# Patient Record
Sex: Male | Born: 1980 | Race: Black or African American | Hispanic: No | Marital: Single | Smoking: Current every day smoker
Health system: Southern US, Community
[De-identification: ages and names within clinical notes are randomized; demographics above are authoritative.]

## PROBLEM LIST (undated history)

## (undated) DIAGNOSIS — M109 Gout, unspecified: Secondary | ICD-10-CM

## (undated) DIAGNOSIS — L732 Hidradenitis suppurativa: Secondary | ICD-10-CM

## (undated) DIAGNOSIS — J449 Chronic obstructive pulmonary disease, unspecified: Secondary | ICD-10-CM

## (undated) DIAGNOSIS — I509 Heart failure, unspecified: Secondary | ICD-10-CM

## (undated) DIAGNOSIS — I10 Essential (primary) hypertension: Secondary | ICD-10-CM

## (undated) DIAGNOSIS — F209 Schizophrenia, unspecified: Secondary | ICD-10-CM

## (undated) HISTORY — PX: OTHER SURGICAL HISTORY: SHX169

---

## 2004-10-07 ENCOUNTER — Emergency Department: Payer: Self-pay | Admitting: Internal Medicine

## 2006-07-15 ENCOUNTER — Emergency Department: Payer: Self-pay | Admitting: Emergency Medicine

## 2007-10-03 ENCOUNTER — Inpatient Hospital Stay: Payer: Self-pay | Admitting: Internal Medicine

## 2008-03-26 ENCOUNTER — Emergency Department: Payer: Self-pay | Admitting: Emergency Medicine

## 2008-04-28 ENCOUNTER — Emergency Department: Payer: Self-pay | Admitting: Emergency Medicine

## 2008-09-24 ENCOUNTER — Emergency Department: Payer: Self-pay | Admitting: Emergency Medicine

## 2008-09-25 ENCOUNTER — Emergency Department: Payer: Self-pay | Admitting: Emergency Medicine

## 2008-12-11 ENCOUNTER — Emergency Department: Payer: Self-pay | Admitting: Emergency Medicine

## 2008-12-21 ENCOUNTER — Emergency Department: Payer: Self-pay | Admitting: Internal Medicine

## 2009-03-08 ENCOUNTER — Emergency Department: Payer: Self-pay | Admitting: Emergency Medicine

## 2009-03-23 ENCOUNTER — Emergency Department: Payer: Self-pay | Admitting: Emergency Medicine

## 2009-07-02 ENCOUNTER — Emergency Department: Payer: Self-pay | Admitting: Emergency Medicine

## 2009-07-09 ENCOUNTER — Emergency Department: Payer: Self-pay | Admitting: Emergency Medicine

## 2009-08-22 ENCOUNTER — Emergency Department: Payer: Self-pay | Admitting: Unknown Physician Specialty

## 2009-11-04 ENCOUNTER — Emergency Department: Payer: Self-pay | Admitting: Emergency Medicine

## 2010-03-25 ENCOUNTER — Inpatient Hospital Stay: Payer: Self-pay | Admitting: Internal Medicine

## 2010-04-30 ENCOUNTER — Emergency Department: Payer: Self-pay | Admitting: Internal Medicine

## 2010-05-18 ENCOUNTER — Emergency Department: Payer: Self-pay | Admitting: Emergency Medicine

## 2010-05-19 ENCOUNTER — Inpatient Hospital Stay: Payer: Self-pay | Admitting: Internal Medicine

## 2010-06-25 ENCOUNTER — Emergency Department: Payer: Self-pay | Admitting: Emergency Medicine

## 2010-06-29 ENCOUNTER — Inpatient Hospital Stay: Payer: Self-pay | Admitting: Internal Medicine

## 2010-06-29 ENCOUNTER — Inpatient Hospital Stay: Payer: Self-pay | Admitting: Psychiatry

## 2010-07-02 ENCOUNTER — Inpatient Hospital Stay: Payer: Self-pay | Admitting: Psychiatry

## 2010-11-13 ENCOUNTER — Ambulatory Visit: Payer: Self-pay | Admitting: Surgery

## 2010-11-19 ENCOUNTER — Ambulatory Visit: Payer: Self-pay | Admitting: Surgery

## 2010-11-22 LAB — PATHOLOGY REPORT

## 2011-02-18 ENCOUNTER — Ambulatory Visit: Payer: Self-pay | Admitting: Family

## 2011-04-15 IMAGING — CR DG CHEST 2V
1 series · 2 of 2 positions shown · non-contrast
Comparison: none

REASON FOR EXAM: fever, leukocytosis
COMMENTS:

[Series 1: view not recorded · 0.17mm/px · 2 of 2 slices shown]
[im 1/2]
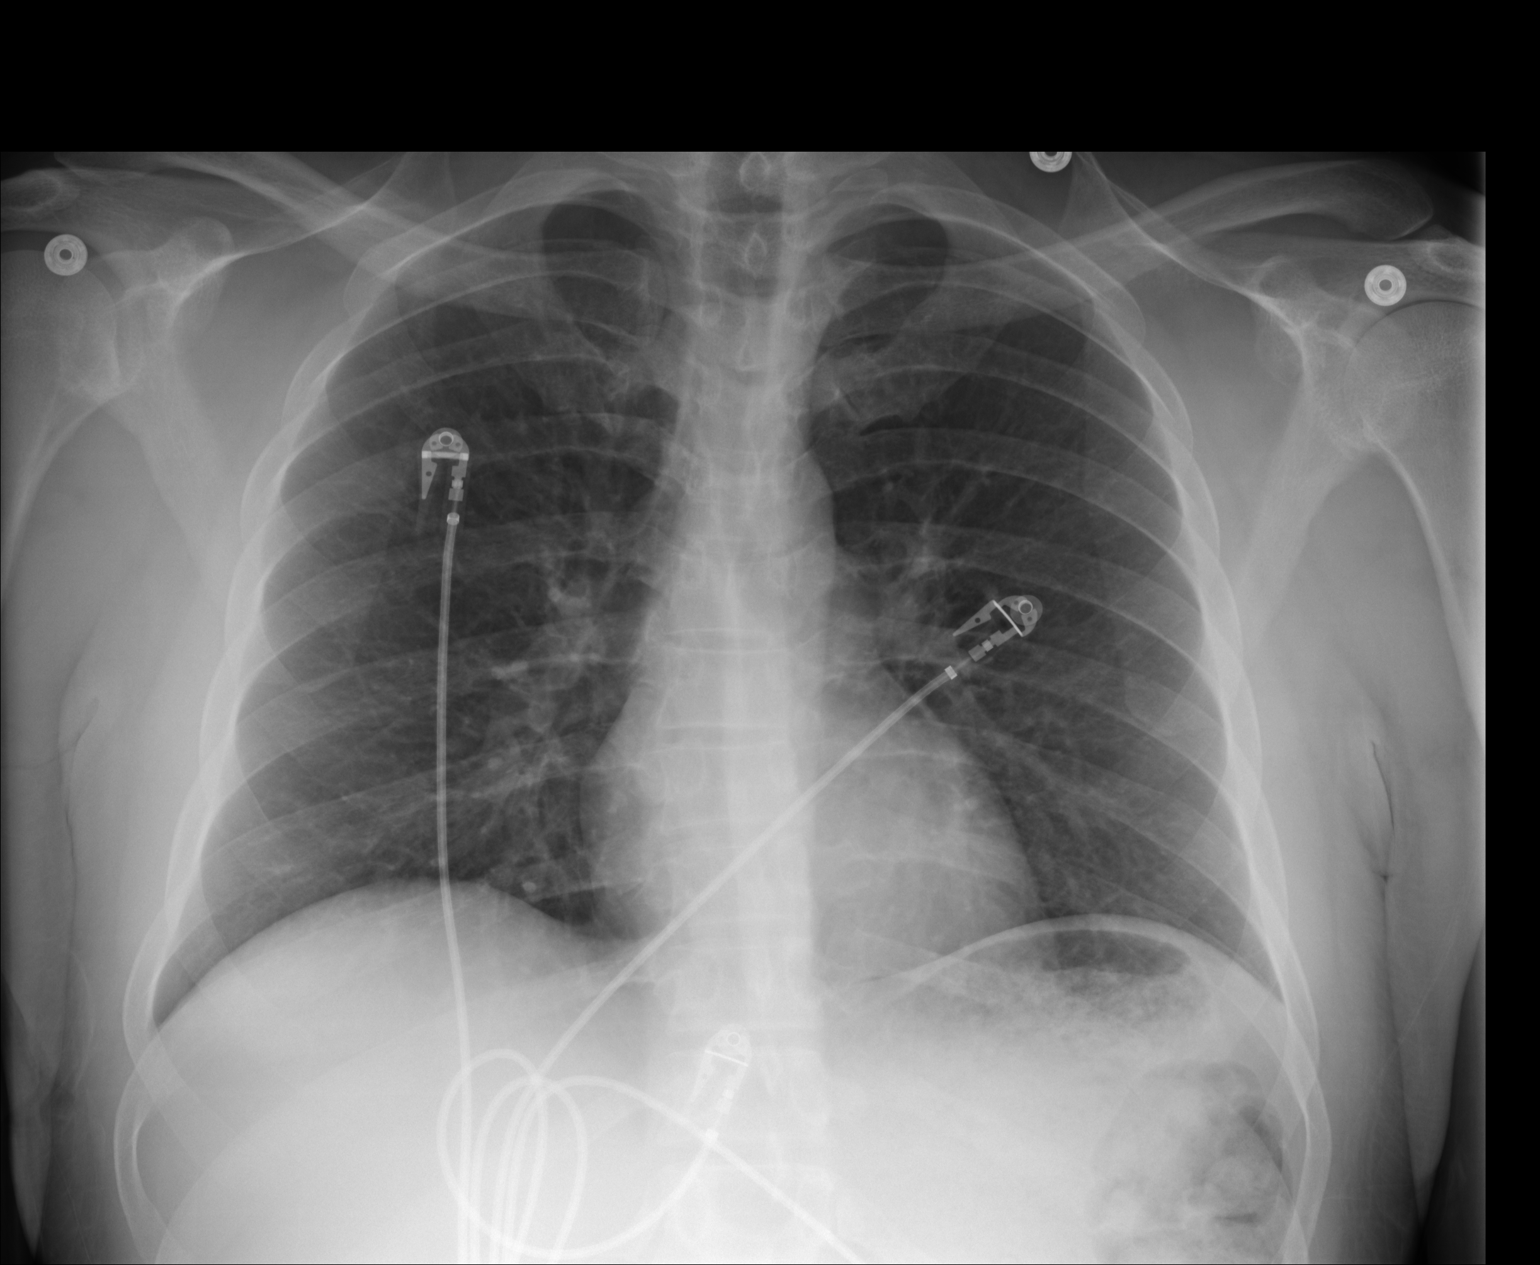
[im 2/2]
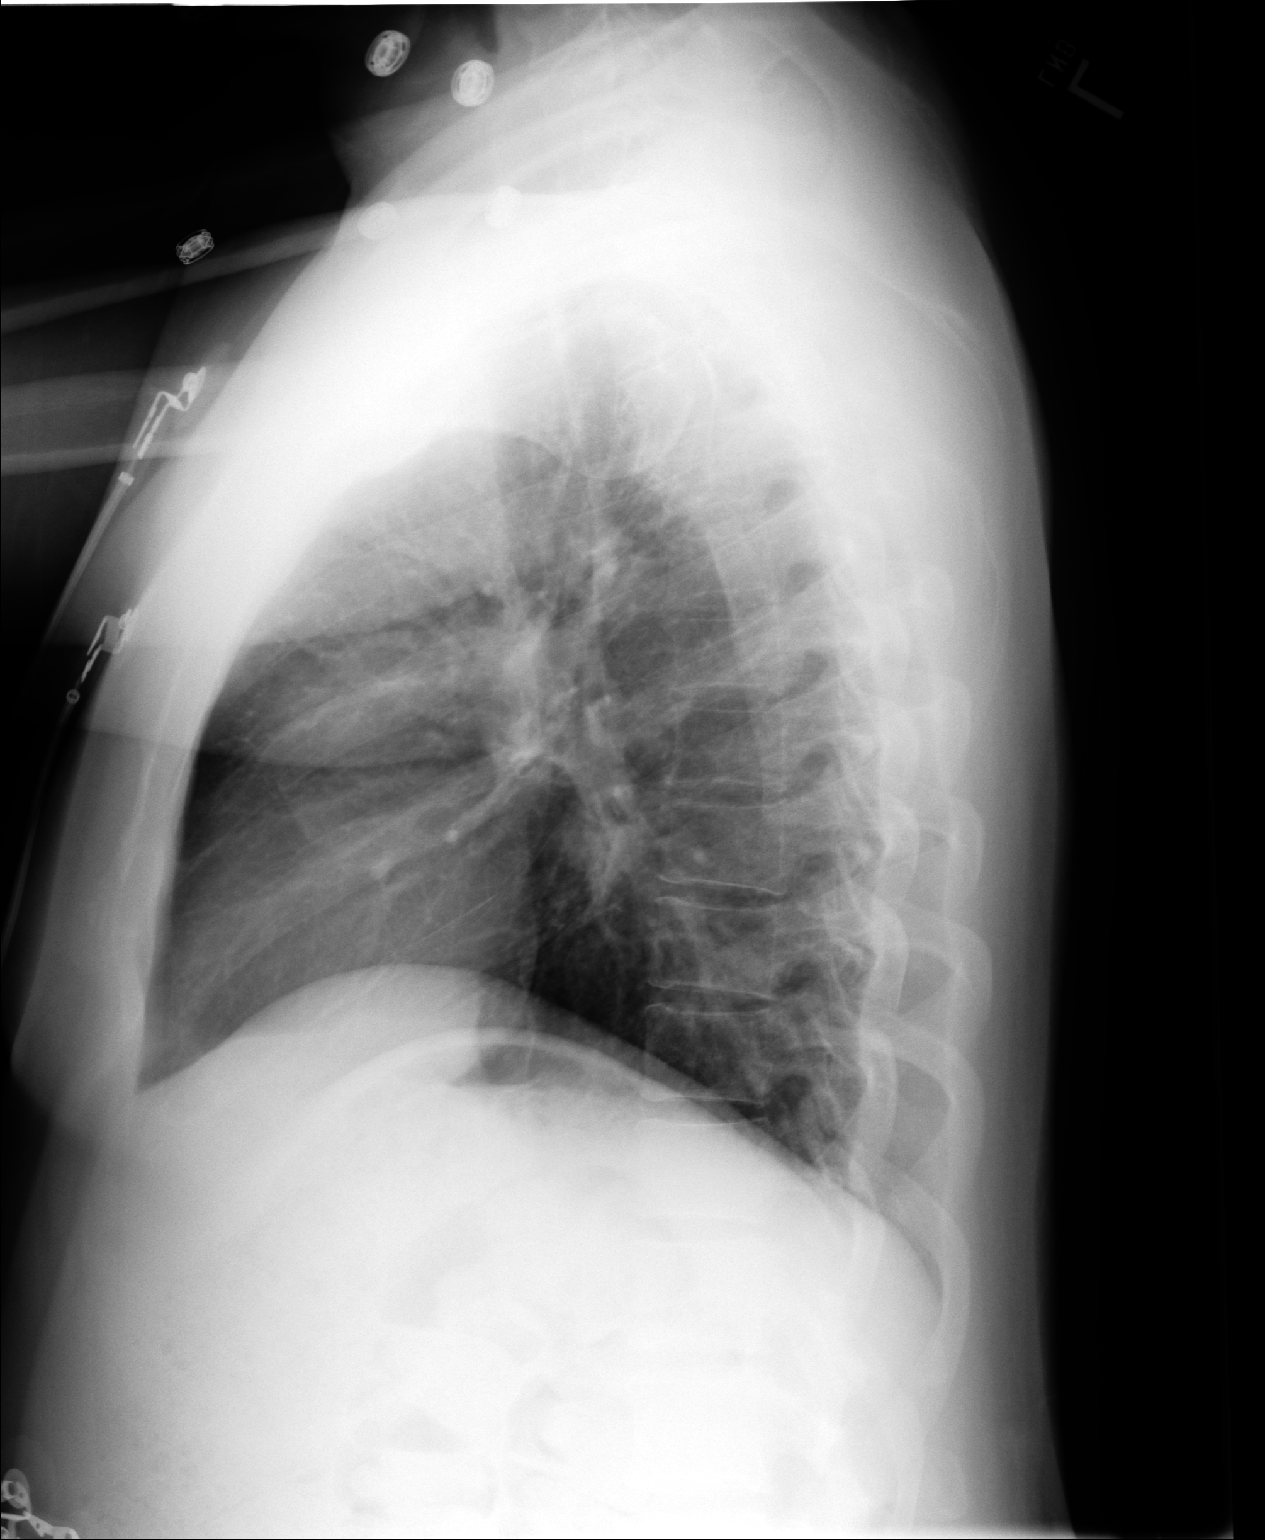

[2 of 2 positions shown; findings below may reference images not displayed]

PROCEDURE:     DXR - DXR CHEST PA (OR AP) AND LATERAL  - May 24, 2010  [DATE]

RESULT:     Comparison is made to previous examination dated 03/25/2010.

Cardiac monitoring electrodes are present. The lungs are clear. The heart
and pulmonary vessels are normal. The bony and mediastinal structures are
unremarkable. There is no effusion. There is no pneumothorax or evidence of
congestive failure.
IMPRESSION: No acute cardiopulmonary disease.

## 2012-06-22 ENCOUNTER — Emergency Department: Payer: Self-pay | Admitting: *Deleted

## 2012-06-22 LAB — URINALYSIS, COMPLETE
Bacteria: NONE SEEN
Bilirubin,UR: NEGATIVE
Blood: NEGATIVE
Glucose,UR: NEGATIVE mg/dL (ref 0–75)
Ketone: NEGATIVE
Nitrite: NEGATIVE
Ph: 7 (ref 4.5–8.0)
Protein: NEGATIVE
RBC,UR: 1 /HPF (ref 0–5)
Specific Gravity: 1.015 (ref 1.003–1.030)
Squamous Epithelial: <1
WBC UR: 13 /HPF (ref 0–5)

## 2012-12-16 ENCOUNTER — Emergency Department: Payer: Self-pay | Admitting: Emergency Medicine

## 2012-12-28 ENCOUNTER — Inpatient Hospital Stay: Payer: Self-pay | Admitting: Emergency Medicine

## 2012-12-29 LAB — CBC WITH DIFFERENTIAL/PLATELET
Basophil #: 0.1 10*3/uL (ref 0.0–0.1)
Eosinophil #: 0.1 10*3/uL (ref 0.0–0.7)
Eosinophil %: 1 %
HGB: 12.2 g/dL — ABNORMAL LOW (ref 13.0–18.0)
Lymphocyte #: 2.9 10*3/uL (ref 1.0–3.6)
MCH: 29.2 pg (ref 26.0–34.0)
MCHC: 32.6 g/dL (ref 32.0–36.0)
Neutrophil #: 7.5 10*3/uL — ABNORMAL HIGH (ref 1.4–6.5)
Neutrophil %: 65.9 %
Platelet: 304 10*3/uL (ref 150–440)
RDW: 15.3 % — ABNORMAL HIGH (ref 11.5–14.5)
WBC: 11.4 10*3/uL — ABNORMAL HIGH (ref 3.8–10.6)

## 2012-12-29 LAB — CREATININE, SERUM
Creatinine: 1.01 mg/dL (ref 0.60–1.30)
EGFR (African American): >60
EGFR (Non-African Amer.): >60

## 2012-12-29 LAB — PATHOLOGY REPORT

## 2012-12-30 LAB — CBC WITH DIFFERENTIAL/PLATELET
Basophil %: 0.4 %
Eosinophil %: 1.9 %
HCT: 36.3 % — ABNORMAL LOW (ref 40.0–52.0)
HGB: 12.2 g/dL — ABNORMAL LOW (ref 13.0–18.0)
Lymphocyte %: 21.5 %
MCH: 29.8 pg (ref 26.0–34.0)
MCV: 89 fL (ref 80–100)
Neutrophil %: 70.7 %
WBC: 12 10*3/uL — ABNORMAL HIGH (ref 3.8–10.6)

## 2013-03-06 ENCOUNTER — Emergency Department: Payer: Self-pay | Admitting: Emergency Medicine

## 2013-03-06 LAB — BASIC METABOLIC PANEL
BUN: 13 mg/dL (ref 7–18)
Calcium, Total: 8.7 mg/dL (ref 8.5–10.1)
Co2: 26 mmol/L (ref 21–32)
EGFR (African American): >60
Osmolality: 275 (ref 275–301)
Sodium: 138 mmol/L (ref 136–145)

## 2013-03-06 LAB — CBC
MCV: 86 fL (ref 80–100)
Platelet: 379 10*3/uL (ref 150–440)
RBC: 4.64 10*6/uL (ref 4.40–5.90)
RDW: 15.5 % — ABNORMAL HIGH (ref 11.5–14.5)
WBC: 13.4 10*3/uL — ABNORMAL HIGH (ref 3.8–10.6)

## 2013-08-08 ENCOUNTER — Emergency Department: Payer: Self-pay | Admitting: Emergency Medicine

## 2014-04-25 LAB — CBC
HCT: 48.5 % (ref 40.0–52.0)
HGB: 15.7 g/dL (ref 13.0–18.0)
MCH: 28.7 pg (ref 26.0–34.0)
MCHC: 32.3 g/dL (ref 32.0–36.0)
MCV: 89 fL (ref 80–100)
Platelet: 329 10*3/uL (ref 150–440)
RBC: 5.46 10*6/uL (ref 4.40–5.90)
RDW: 15.7 % — ABNORMAL HIGH (ref 11.5–14.5)
WBC: 15.3 10*3/uL — ABNORMAL HIGH (ref 3.8–10.6)

## 2014-04-25 LAB — URINALYSIS, COMPLETE
BILIRUBIN, UR: NEGATIVE
BLOOD: NEGATIVE
Bacteria: NONE SEEN
Glucose,UR: NEGATIVE mg/dL (ref 0–75)
Ketone: NEGATIVE
LEUKOCYTE ESTERASE: NEGATIVE
Nitrite: NEGATIVE
Ph: 6 (ref 4.5–8.0)
RBC,UR: 3 /HPF (ref 0–5)
SPECIFIC GRAVITY: 1.024 (ref 1.003–1.030)
Squamous Epithelial: 1
WBC UR: 6 /HPF (ref 0–5)

## 2014-04-25 LAB — COMPREHENSIVE METABOLIC PANEL
ALT: 40 U/L (ref 12–78)
ANION GAP: 5 — AB (ref 7–16)
Albumin: 3.7 g/dL (ref 3.4–5.0)
Alkaline Phosphatase: 87 U/L
BILIRUBIN TOTAL: 0.9 mg/dL (ref 0.2–1.0)
BUN: 8 mg/dL (ref 7–18)
Calcium, Total: 9.1 mg/dL (ref 8.5–10.1)
Chloride: 106 mmol/L (ref 98–107)
Co2: 26 mmol/L (ref 21–32)
Creatinine: 1.22 mg/dL (ref 0.60–1.30)
EGFR (Non-African Amer.): >60
GLUCOSE: 107 mg/dL — AB (ref 65–99)
Osmolality: 273 (ref 275–301)
Potassium: 3.3 mmol/L — ABNORMAL LOW (ref 3.5–5.1)
SGOT(AST): 39 U/L — ABNORMAL HIGH (ref 15–37)
SODIUM: 137 mmol/L (ref 136–145)
Total Protein: 10.1 g/dL — ABNORMAL HIGH (ref 6.4–8.2)

## 2014-04-25 LAB — DRUG SCREEN, URINE
Amphetamines, Ur Screen: NEGATIVE (ref ?–1000)
BARBITURATES, UR SCREEN: NEGATIVE (ref ?–200)
BENZODIAZEPINE, UR SCRN: NEGATIVE (ref ?–200)
COCAINE METABOLITE, UR ~~LOC~~: NEGATIVE (ref ?–300)
Cannabinoid 50 Ng, Ur ~~LOC~~: POSITIVE (ref ?–50)
MDMA (ECSTASY) UR SCREEN: NEGATIVE (ref ?–500)
Methadone, Ur Screen: NEGATIVE (ref ?–300)
Opiate, Ur Screen: NEGATIVE (ref ?–300)
Phencyclidine (PCP) Ur S: NEGATIVE (ref ?–25)
TRICYCLIC, UR SCREEN: NEGATIVE (ref ?–1000)

## 2014-04-25 LAB — SALICYLATE LEVEL: SALICYLATES, SERUM: 4 mg/dL — AB

## 2014-04-25 LAB — ETHANOL

## 2014-04-25 LAB — ACETAMINOPHEN LEVEL: Acetaminophen: <2 ug/mL

## 2014-04-26 ENCOUNTER — Inpatient Hospital Stay: Payer: Self-pay | Admitting: Psychiatry

## 2014-04-30 LAB — WBC: WBC: 11.9 10*3/uL — ABNORMAL HIGH (ref 3.8–10.6)

## 2014-11-05 ENCOUNTER — Emergency Department: Payer: Self-pay | Admitting: Emergency Medicine

## 2014-11-05 LAB — URIC ACID: URIC ACID: 5.8 mg/dL (ref 3.5–7.2)

## 2015-03-09 NOTE — Op Note (Signed)
PATIENT NAME:  Walter Pena, Walter Pena MR#:  [id number] DATE OF BIRTH:  11/24/1980  DATE OF PROCEDURE:  12/28/2012  This patient was seen by my office yesterday on an emergent basis. The patient had hydradenitis of the perineal area as well as a pilonidal cyst abscess and he was draining pus from all these areas and there is extensive disease involving the lateral and the medial aspect of the anal canal and had had multiple of pus coming out. The patient does not have any history of any Crohn's disease or history suggestive of that.  He had in the past extensive hydradenitis of both groins which he had surgery done before and then the wound closed on itself and then he also had hydradenitis of both axilla. This part on the back was really bad, 1 of the worst I have ever seen, because there was not a single area in this perineum which did not have any opening or draining pus.   My decision-making was such that the 2 to 3 fistulous opening which was going into the pilonidal cyst area first of all was draining a lot of pus so I did cultures from there and I put probes in multiple flap tracts.  I left it open and all the thickened area of the skin which he had chronic inflammation I also debrided skin and subcutaneous tissue with the Bovie and excised this hardened area, especially on the pilonidal cyst area.   The patient had another area on the right medial aspect of the rectum which also had multiple fistulous openings and these tracts were laid open and skin and subcutaneous tissue was then debrided with the Bovie and the tissue was about 3 cm thick.  The  thickness of the skin and subcutaneous tissue was then completely removed on this side and also multiple opening of the sinuses was done. Then there was another area where debridement was done, was in the left side where also there were multiple tracts and abscesses.  It was also debrided and then an area a little below at about 4 o'clock position also was then  debrided.   After extensive debridement was then performed and the wound was then packed with iodoform gauze dressing and a little bit 4 x 4 partially so with Betadine dressing to cover this up. No wounds were closed. The patient was then discharged back to the recovery room and will be admitted and will take ID consult. At this time, I am going to put him on Zosyn and it depends on what culture shows, then will change the antibiotics.   This is 1 of the worst cases I have ever done due to extensive disease for multiple draining sinuses and chronic hydradenitis of both axilla. It looked like stage IV hydradenitis with multiple fistulous openings. My plan was to get rid of infection first and then later on maybe do excision of these areas to maybe primary close but right now, hopefully, it can close itself.     ____________________________ Alton Revere. Cecelia Byars, MD msh:cs D: 12/28/2012 14:23:00 ET T: 12/28/2012 14:58:24 ET JOB#: [id number]  cc: Billiejean Schimek S. Cecelia Byars, MD, <Dictator> Corky Downs, MD Meryle Ready MD ELECTRONICALLY SIGNED 12/30/2012 18:38

## 2015-03-09 NOTE — Consult Note (Signed)
PATIENT NAME:  Walter Pena, Walter Pena MR#:  [id number] DATE OF BIRTH:  1981/05/03  DATE OF CONSULTATION:  12/29/2012  REFERRING PHYSICIAN:  Dr. Cecelia Byars.   CONSULTING PHYSICIAN:  Rosalyn Gess. Terriyah Westra, MD  REASON FOR CONSULTATION: Pilonidal cyst, status post I and D.   HISTORY OF PRESENT ILLNESS: The patient is a 34 year old man with a past history significant for schizophrenia and hidradenitis suppurativa, who was admitted yesterday after surgery on an infectious mass in the perirectal area. The patient states that he has had hidradenitis lesions in the groin and in the axillary area for several years. He has noted a lesion in the perianal area for approximately the last year. All of these lesions have been treated on and off with Keflex, which has typically caused them to either drain or shrink down. The perirectal lesion has not significantly changed. He was recently in jail for several months and the area went without any treatment. After getting out of jail, he was seen by his primary care doctor who referred him to surgery. Surgery yesterday drained a prior cyst abscess with multiple draining areas. Cultures have been obtained. He was started on Zosyn. The patient states he has not been having fevers, chills or sweats but has had some perianal discomfort.   ALLERGIES: ALOE AND LISINOPRIL.   PAST MEDICAL HISTORY: 1.  Schizophrenia.  2.  Hidradenitis suppurativa.  3.  Hypertension.  4.  Constipation.   SOCIAL HISTORY: In the past, he has lived in a group home. He also has recently been in jail.  He has a history of smoking regularly. He does not drink. No injecting drug use history.   FAMILY HISTORY: Positive for diabetes, hypertension and hypercholesterolemia.   REVIEW OF SYSTEMS:  GENERAL:  No fevers, chills or sweats. No nausea, vomiting or change in his bowels. He has had some perianal pain. No abdominal pain, no shortness of breath or cough. No urinary symptoms. No skin lesions other than the  hidradenitis in the axilla and inguinal region as described in the H and P.  PHYSICAL EXAMINATION: VITAL SIGNS: T-max 99.3, T-current 99.0, pulse 69, blood pressure 100/64, 96% on room air.  GENERAL: A 34 year old black man in no acute distress.  HEENT: Normocephalic, atraumatic.  LUNGS: Clear to auscultation bilaterally. Good air movement. No focal consolidation.  HEART: Regular rate and rhythm without murmur, rub or gallop.  ABDOMEN: Soft, nontender and nondistended. No hepatosplenomegaly. No hernia is noted.  RECTAL:  The perirectal area had a bandage in place that was saturated with blood. The wound itself was not directly observed. The area was moderately tender.  SKIN: There were no rashes or lesions other than the perirectal area.  NEUROLOGIC: The patient was awake and interactive, moving all 4 extremities.  PSYCHIATRIC: Mood and affect appeared normal.   LABORATORY DATA: Creatinine of 1.01. White count of 11.4, hemoglobin of 12.2, platelet count of 304, ANC of 7.5. Wound culture has no growth at 8 to 12 hours. Pathology report shows subcutaneous tissue from the perirectal area and a pilonidal cyst with abscess, no dysplasia or malignancy.   IMPRESSION: A 34 year old male with a history of hidradenitis suppurativa and schizophrenia, admitted with perirectal abscess.   RECOMMENDATIONS: 1.  He underwent I and D of the perirectal lesion yesterday. Pathology demonstrates evidence for a pilonidal cyst. He is currently on Zosyn. This will provide broad-spectrum coverage for typical organisms from the GI tract.  2.  Will await the culture results.  3.  Often hidradenitis suppurativa  is culture negative. The operative description is more consistent with a pilonidal cyst with abscess and is confirmed by pathology. We will change to oral therapy once the cultures return.   This is a low-level infectious disease consult. Thank you very much for involving me in the patient's care.        ____________________________ Rosalyn Gess. Lakely Elmendorf, MD meb:cs D: 12/29/2012 15:00:00 ET T: 12/29/2012 15:20:59 ET JOB#: [id number]  cc: Rosalyn Gess. Jasson Siegmann, MD, <Dictator> Laelyn Blumenthal E Adalea Handler MD ELECTRONICALLY SIGNED 01/03/2013 16:21

## 2015-03-09 NOTE — Consult Note (Signed)
Impression:    35yo male w/ h/o hydradenitis supporativa and schizophrenia admitted with perirectal abscess.     He underwent I&D of the perirectal lesion yesterday.  Pathology demonstrates evidence for a pilonidal cyst.      He is currently on zosyn.  This will provide broad spectrum coverage for typical organisms from the GI tract.    Will await the culture results.    Often hydradenitis supporativa is culture negative.  The operative description is more consistent with a pilonidal cyst and is confirmed by pathology.  Will change to oral therapy once the cultures return.  Electronic Signatures: Correll Denbow MPH, Rosalyn Gess (MD)  (Signed on 12-Feb-14 14:54)  Authored  Last Updated: 12-Feb-14 14:54 by Pariss Hommes MPH, Rosalyn Gess (MD)

## 2015-03-09 NOTE — Discharge Summary (Signed)
Dates of Admission and Diagnosis:  Date of Admission 28-Dec-2012   Date of Discharge 31-Dec-2012   Admitting Diagnosis Infection of the perinium   Final Diagnosis hidroadenitis of perinium and pilonoidal abcress    Chief Complaint/History of Present Illness pt cam with multiple abcesses around the anal area and pilonoidal area .pt had grade 4 hidroadenitis .This process is goin on for the last 2 yeras   Pathology:  11-Feb-14 00:12   Pathology Report ========== TEST NAME ==========  ========= RESULTS =========  = REFERENCE RANGE =  PATHOLOGY REPORT  Pathology Report .                               [   Final Report         ]                   Material submitted:         Marland Kitchen SUBCUTANEOUS TISSUE PERIRECTAL AREA/PILONIDAL CYST .                               [   Final Report         ]                   Pre-operative diagnosis:                                        . MULTIPLE PERIRECTAL ABSCESSES I/D PERIRECTAL SUBCUTANEOUS TISSUE OF PERIRECTAL REGION/PILONIDAL CYST .                               [   Final Report         ]                   ********************************************************************** Diagnosis: SUBCUTANEOUS TISSUE PERIRECTAL AREA/PILONIDAL CYST, EXCISION: - PILONIDAL CYST WITH ABSCESS. - NEGATIVE FOR DYSPLASIA AND MALIGNANCY. XDB/12/29/2012 ********************************************************************** .                               [   Final Report    ]                   Electronically signed:                                     . Lum Babe, MD, Pathologist .                               [   Final Report         ]                   Gross description:    . Received in a formalin filled container labeled Walter Pena is a 6.5 x 5.0 x 2.0 cm aggregate of multiple irregularly shaped unoriented fragments of dark brown pigmented skin and attached subcutaneous tissue. The epidermal surfaces are slightly nodular with focal tan lesions measuring  up 0.2 cm in greatest dimensions. Sectioning  demonstrates dense white tan fibrous cut surface with softened red tan cystic area and apparent fistulas tract. No discrete masses are grossly seen. Representative sections are submitted in cassette 1. Part A, Block 1 - 2 pieces QAC/KCT .                               [   Final Report         ]                   Pathologist provided ICD-9: 685.0 .                               [   Final Report      ]                   CPT                                                        . [id number]               [facility]            No: [id number]           [address], [place], [place] [address]           Walter Homer, MD   [phone]                                         [id number]   Result(s) reported on 29 Dec 2012 at 01:50PM.  Routine Micro:  11-Feb-14 11:51   Micro Text Report WOUND AER/ANAEROBIC CULT   COMMENT                   HOLDING FOR POSSIBLE ANAEROBES   GRAM STAIN                MODERATE WHITE BLOOD CELLS   GRAM STAIN                RARE GRAM POSITIVE COCCI   ANTIBIOTIC                        Specimen Source RECTAL ABSCESS PUS  Culture Comment HOLDING FOR POSSIBLE ANAEROBES  Gram Stain 1 MODERATE WHITE BLOOD CELLS  Gram Stain 2 RARE GRAM POSITIVE COCCI  Result(s) reported on 30 Dec 2012 at 08:56AM.  Routine Chem:  12-Feb-14 04:17   Creatinine (comp) 1.01  eGFR (African American) >60  eGFR (Non-African American) >60 (eGFR values <81mL/min/1.73 m2 may be an indication of chronic kidney disease (CKD). Calculated eGFR is useful in patients with stable renal function. The eGFR calculation will not be reliable in acutely ill patients when serum creatinine is changing rapidly. It is not useful in  patients on dialysis. The eGFR calculation may not be applicable to patients at the low and high extremes of body sizes, pregnant women, and vegetarians.)  Routine Hem:  12-Feb-14 04:12   WBC  (CBC)  11.4  RBC (CBC)  4.18  Hemoglobin (CBC)  12.2  Hematocrit (CBC)  37.4  Platelet Count (CBC) 304  MCV 90  MCH 29.2  MCHC 32.6  RDW  15.3  Neutrophil % 65.9  Lymphocyte % 25.6  Monocyte % 6.7  Eosinophil % 1.0  Basophil % 0.8  Neutrophil #  7.5  Lymphocyte # 2.9  Monocyte # 0.8  Eosinophil # 0.1  Basophil # 0.1 (Result(s) reported on 29 Dec 2012 at 08:12AM.)  13-Feb-14 04:26   WBC (CBC)  12.0  RBC (CBC)  4.08  Hemoglobin (CBC)  12.2  Hematocrit (CBC)  36.3  Platelet Count (CBC) 323  MCV 89  MCH 29.8  MCHC 33.5  RDW  15.4  Neutrophil % 70.7  Lymphocyte % 21.5  Monocyte % 5.5  Eosinophil % 1.9  Basophil % 0.4  Neutrophil #  8.5  Lymphocyte # 2.6  Monocyte # 0.7  Eosinophil # 0.2  Basophil # 0.1 (Result(s) reported on 30 Dec 2012 at [facility] did very well had consult with the infectious disease and put on iv Zosyn and changed to augmentin .visiting nursees will follow wound care out patient   Condition on Discharge Good   DISCHARGE INSTRUCTIONS HOME MEDS:  Medication Reconciliation: Patient's Home Medications at Discharge:     Medication Instructions  amlodipine 10 mg oral tablet  1  orally   daily   acetaminophen-oxycodone 325 mg-5 mg oral tablet  1 tab(s) orally every 4 hours, As needed, pain   fluoxetine 20 mg oral capsule  1 cap(s) orally once a day   hydrochlorothiazide 12.5 mg oral tablet  1 tab(s) orally once a day   amoxicillin-clavulanate  875 milligram(s) orally 2 times a day    PRESCRIPTIONS: PRINTED AND PLACED ON CHART   Physician's Instructions:  Home Health? Yes   [facility]   Treatments None   Dressing Care Remove dressing tomorrow.  May shower.  visiting nurse will explain the dressing care tomorrow   Diet Regular   Diet Consistency Regular Consistency   Activity Limitations No exertional activity   Return to Work after follow up visit with MD   Time frame for  Follow Up Appointment 1-2 weeks   Time frame for Follow Up Appointment 1-2 weeks   Electronic Signatures: Vella Kohler (MD)  (Signed 14-Feb-14 17:08)  Authored: ADMISSION DATE AND DIAGNOSIS, CHIEF COMPLAINT/HPI, PERTINENT [place], PATIENT INSTRUCTIONS   Last Updated: 14-Feb-14 17:08 by Vella Kohler (MD)

## 2015-03-10 NOTE — Discharge Summary (Signed)
PATIENT NAME:  Walter Pena, Walter Pena MR#:  [id number] DATE OF BIRTH:  Mar 17, 1981  DATE OF ADMISSION:  04/26/2014 DATE OF DISCHARGE:  05/04/2014  HOSPITAL COURSE: This is a 34 year old gentleman who has a history of schizophrenia, who was brought into the hospital disorganized, paranoid, and psychotic with agitated behavior and poor self-care. He was started back on his Haldol decanoate and oral Haldol, which he has tolerated well. Additionally, he has been treated with Cogentin to prevent side effects, as well as medications for his blood pressure. He has been compliant with treatment throughout his hospital stay and, after starting back on his medicine, his mental status has improved significantly. His mood appears to be calm, and is stated as good. Affect is euthymic and appropriate. Thoughts appear clear and no longer paranoid. He met with his family several times and discussed long-term living situation. He would prefer to go live in a boarding house longer-term, but does not have any money until his next check comes. He is agreeable to going to the [facility] for the next couple of weeks in preparation for that. He has been counseled about the importance of staying on his medication,  avoiding substance abuse, and trying to stay on a stable schedule. He states an understanding of all of that. He will be followed by the [facility] ACT team and he is agreeable to that plan as well.   MENTAL STATUS EXAM AT DISCHARGE: Casually dressed, neatly groomed gentleman who looks his stated age. Cooperative with the interview. Good eye contact. Normal psychomotor activity. Speech normal rate, tone and volume. Affect euthymic, reactive, appropriate. Mood stated as okay. Thoughts are lucid without loosening of associations or delusions. Denies auditory or visual hallucinations. Denies suicidal or homicidal ideation. Shows improved judgment and insight. Normal intelligence. Alert and oriented x 4. Short and long-term  memory intact.   LABORATORY RESULTS: Admission labs showed a cannibis positive drug screen, nothing else positive. Chemistry panel: Slightly low potassium 3.3, elevated AST at 39, elevated total protein 10.1. Alcohol level negative. CBC showed a hemoglobin of 15.7, hematocrit 48.5, white count 15.3. Urinalysis unremarkable. Follow-up white blood cell count improved at 11.9.   DISCHARGE MEDICATIONS: Haldol decanoate 100 mg intramuscular every four weeks with the next dose due approximately July 10, Cogentin 1 mg p.o. b.i.d., Haldol oral 5 mg at night, amlodipine 10 mg once a day, hydrochlorothiazide 12.5 mg once a day.   DISPOSITION: Discharged to [facility]. Follow up with [facility].   DIAGNOSIS, PRINCIPAL AND PRIMARY:  AXIS I: Schizophrenia, paranoid type.   SECONDARY DIAGNOSES:  AXIS I: Marijuana abuse.  AXIS II: No diagnosis.  AXIS III: History of hidradenitis suppurativa and hypertension.  AXIS IV: Moderate to severe from needing to go to the shelter for right now, also legal problems still ongoing.  AXIS V: Functioning at time of discharge is 49.    ____________________________ Gonzella Lex, MD jtc:ts D: 05/04/2014 11:03:08 ET T: 05/04/2014 11:11:18 ET JOB#: [id number]  cc: Gonzella Lex, MD, <Dictator> Gonzella Lex MD ELECTRONICALLY SIGNED 05/09/2014 17:10

## 2015-03-10 NOTE — H&P (Signed)
PATIENT NAME:  Walter Pena, Walter Pena MR#:  [id number] DATE OF BIRTH:  [birth year]  DATE OF ADMISSION:  04/25/2014  IDENTIFYING INFORMATION AND CHIEF COMPLAINT: This is a 34 year old man with a history of schizophrenia who was brought to the Emergency Room under involuntary petition.   CHIEF COMPLAINT: "I don't know.  Ask my mother and sister."   HISTORY OF PRESENT ILLNESS: Information obtained from the patient and the chart. The patient was brought in under involuntary commitment taken out by his family. The family reports that he has been getting increasingly agitated. He pounds on people's door in the middle of the night. He has issued threats to family members. He appears to be disorganized and paranoid. He has not been taking his medication in many months and they are worried about his decompensation.  The patient has little insight into this. He denies that there has been any problem with his mood or behavior. He denies that he is having hallucinations. He denies suicidal or homicidal ideation. He does admit that he has been off his medicines for many months though and somehow still recognizes that is a problem. He denies that he is abusing any drugs saying that he has not even used marijuana in long enough for it to still be in his system. He admits he is not taking his medicine and that is because he just got out of jail a couple of months ago and has not restarted any of his medication or benefits since then. He is also not taking medicine for his blood pressure. He is vague about where he has been staying recently.   PAST PSYCHIATRIC HISTORY: Documented past history of schizophrenia with episodes of paranoia, bizarre behavior, and disorganization. In the past, he had been maintained for some time on Zyprexa and also on Haldol to which he had a good response. He has a history of substance abuse as well. He denies any history of suicide attempts or violence to others. There seemed to have been extended  problems with noncompliance.   PAST MEDICAL HISTORY: History of extensive hidradenitis suppurativa which got so bad that he not only needed IV antibiotics but ultimately needed surgery. That was completed last year. He says that he no longer has any skin infections or sores anywhere and is not taking any antibiotics. He also has high blood pressure. Also has a history of extremely bad acne. He tells me that part of the scarring on his face is from having been bitten by a spider when he was in prison. I am not sure if that is true because it is just one more thing on top of his already terrible skin conditions.   SOCIAL HISTORY: Not married. His closest relatives are his mother and sister. In the past, he had lived with them. They seem to be intermittently supportive of him. The patient has had an extensive record of run-ins with the law. He was in jail for several months earlier this year.  It is not clear what the charges were, but we have been told at this point that there are some more warrants out for him when he does get out of the hospital.   FAMILY HISTORY: None known.   CURRENT MEDICATIONS: None.   ALLERGIES: ALOE, LISINOPRIL.   MENTAL STATUS EXAMINATION:  Slightly disheveled gentleman who looks his stated age, passively cooperative with the interview. Eye contact good. Psychomotor activity slow. Speech is decreased in total amount. His answers to questions tend to be very short and  often uninformative. Affect flat and constricted. Mood stated as okay. Thoughts are slow, a little bit disorganized, have a paranoid tone to them. Did not make any obviously delusional statements. Denies hallucinations. Denies suicidal or homicidal ideation. Judgment and insight impaired. He is alert and oriented x 4. Baseline intelligence seems normal.   REVIEW OF SYSTEMS: He denies any acute psychiatric symptoms including denying depressive symptoms and psychotic symptoms. Denies any abdominal, pulmonary, or  cardiac symptoms.  General review of systems negative.  PHYSICAL EXAMINATION:  GENERAL: Extensive skin lesions, most obviously on his face where he does have large patches on both sides of his face that could represent burns or a past history of infections. Lots of scarring from acne. No acute lesions identified. I did not exam his groin but took his word for it that there were not any lesions there currently.  HEENT: Pupils equal and reactive. Face symmetric. Oral mucosa dry. NECK AND BACK: Nontender.  EXTREMITIES: Full range of motion at all extremities. Normal gait. Strength and reflexes normal and symmetric throughout.  NEUROLOGICAL: Cranial nerves symmetric and normal.  LUNGS: Clear without wheezes.  HEART: Regular rate and rhythm.  ABDOMEN: Soft, nontender, normal bowel sounds.  VITAL SIGNS: Show a blood pressure of 140/88, respirations 18, pulse 74, temperature 97.7.   LABORATORY RESULTS: Drug screen is positive for cannabis only. Chemistry panel: Low potassium 3.3, elevated glucose 107, AST elevated at 39. Alcohol level negative. CBC: Elevated white count 15.3. Urinalysis positive for protein, otherwise unremarkable.   ASSESSMENT: A 34 year old man with a history of schizophrenia who has been off his medicine probably for at least 6 months, presents paranoid. He is trying his best to keep it under control, but I tend to credit the commitment paperwork that he probably has bizarre and intrusive behaviors outside the hospital and is not taking good care of himself, needs hospital treatment to intervene for control of psychosis.   TREATMENT PLAN: Admit to psychiatry. I am not sure if he has his insurance and Medicare and Medicaid restarted so I am going to go inexpensive with the medicines, putting him back on a low dose of amlodipine and hydrochlorothiazide for his blood pressure and also starting back with Haldol for his psychosis. Haldol 5 mg twice a day prescribed. Engage him in individual  and group psychotherapy and assessment and try and get collateral information from the family.   DIAGNOSIS, PRINCIPAL AND PRIMARY:  AXIS I: Schizophrenia, paranoid type.   SECONDARY DIAGNOSES:  AXIS I: History of marijuana abuse.  AXIS II: Deferred.  AXIS III: Hidradenitis suppurativa, hypertension, extensive skin scarring.  AXIS IV: Moderate to severe from lack of current resources and support.  AXIS V: Functioning at time of evaluation 34.    ____________________________ Audery Amel, MD jtc:dd D: 04/26/2014 19:02:57 ET T: 04/26/2014 19:41:25 ET JOB#: [id number]  cc: Audery Amel, MD, <Dictator> Audery Amel MD ELECTRONICALLY SIGNED 05/03/2014 13:24

## 2015-03-17 ENCOUNTER — Emergency Department: Admit: 2015-03-17 | Disposition: A | Discharge status: Home / Self Care | Payer: Self-pay | Admitting: Internal Medicine

## 2015-03-17 LAB — URINALYSIS, COMPLETE
BACTERIA: NONE SEEN
BILIRUBIN, UR: NEGATIVE
BLOOD: NEGATIVE
GLUCOSE, UR: NEGATIVE mg/dL (ref 0–75)
Ketone: NEGATIVE
LEUKOCYTE ESTERASE: NEGATIVE
NITRITE: NEGATIVE
PH: 6 (ref 4.5–8.0)
Protein: NEGATIVE
Specific Gravity: 1.015 (ref 1.003–1.030)

## 2015-05-19 ENCOUNTER — Emergency Department
Admission: EM | Admit: 2015-05-19 | Discharge: 2015-05-19 | Disposition: A | Discharge status: Home / Self Care | Payer: Medicare Other | Attending: Emergency Medicine | Admitting: Emergency Medicine

## 2015-05-19 ENCOUNTER — Emergency Department: Payer: Medicare Other

## 2015-05-19 DIAGNOSIS — M10071 Idiopathic gout, right ankle and foot: Secondary | ICD-10-CM | POA: Insufficient documentation

## 2015-05-19 DIAGNOSIS — M109 Gout, unspecified: Secondary | ICD-10-CM

## 2015-05-19 DIAGNOSIS — I10 Essential (primary) hypertension: Secondary | ICD-10-CM | POA: Diagnosis not present

## 2015-05-19 DIAGNOSIS — Z720 Tobacco use: Secondary | ICD-10-CM | POA: Insufficient documentation

## 2015-05-19 DIAGNOSIS — M79674 Pain in right toe(s): Secondary | ICD-10-CM | POA: Diagnosis present

## 2015-05-19 HISTORY — DX: Gout, unspecified: M10.9

## 2015-05-19 HISTORY — DX: Essential (primary) hypertension: I10

## 2015-05-19 MED ORDER — OXYCODONE-ACETAMINOPHEN 5-325 MG PO TABS
1.0000 | ORAL_TABLET | ORAL | Status: DC | PRN
Start: 2015-05-19 — End: 2016-01-24

## 2015-05-19 MED ORDER — IBUPROFEN 800 MG PO TABS: 800.0000 mg | ORAL_TABLET | Freq: Three times a day (TID) | ORAL | Status: DC | PRN

## 2015-05-19 NOTE — ED Notes (Signed)
Pt states "i banged my foot" pt with swelling noted to base of right great toe, pt states toe is painful. Pt with history of gout. Pt unsure if trauma or gout has caused swelling and pain to right great toe. Cms intact to toes, 2+ pedal pulse noted.

## 2015-05-19 NOTE — Discharge Instructions (Signed)
1. Take pain medicines as needed (Motrin/Percocet #15). 2. Return to the ER for worsening symptoms, increased redness/swelling or other concerns.  Gout Gout is an inflammatory arthritis caused by a buildup of uric acid crystals in the joints. Uric acid is a chemical that is normally present in the blood. When the level of uric acid in the blood is too high it can form crystals that deposit in your joints and tissues. This causes joint redness, soreness, and swelling (inflammation). Repeat attacks are common. Over time, uric acid crystals can form into masses (tophi) near a joint, destroying bone and causing disfigurement. Gout is treatable and often preventable. CAUSES  The disease begins with elevated levels of uric acid in the blood. Uric acid is produced by your body when it breaks down a naturally found substance called purines. Certain foods you eat, such as meats and fish, contain high amounts of purines. Causes of an elevated uric acid level include:  Being passed down from parent to child (heredity).  Diseases that cause increased uric acid production (such as obesity, psoriasis, and certain cancers).  Excessive alcohol use.  Diet, especially diets rich in meat and seafood.  Medicines, including certain cancer-fighting medicines (chemotherapy), water pills (diuretics), and aspirin.  Chronic kidney disease. The kidneys are no longer able to remove uric acid well.  Problems with metabolism. Conditions strongly associated with gout include:  Obesity.  High blood pressure.  High cholesterol.  Diabetes. Not everyone with elevated uric acid levels gets gout. It is not understood why some people get gout and others do not. Surgery, joint injury, and eating too much of certain foods are some of the factors that can lead to gout attacks. SYMPTOMS   An attack of gout comes on quickly. It causes intense pain with redness, swelling, and warmth in a joint.  Fever can occur.  Often,  only one joint is involved. Certain joints are more commonly involved:  Base of the big toe.  Knee.  Ankle.  Wrist.  Finger. Without treatment, an attack usually goes away in a few days to weeks. Between attacks, you usually will not have symptoms, which is different from many other forms of arthritis. DIAGNOSIS  Your caregiver will suspect gout based on your symptoms and exam. In some cases, tests may be recommended. The tests may include:  Blood tests.  Urine tests.  X-rays.  Joint fluid exam. This exam requires a needle to remove fluid from the joint (arthrocentesis). Using a microscope, gout is confirmed when uric acid crystals are seen in the joint fluid. TREATMENT  There are two phases to gout treatment: treating the sudden onset (acute) attack and preventing attacks (prophylaxis).  Treatment of an Acute Attack.  Medicines are used. These include anti-inflammatory medicines or steroid medicines.  An injection of steroid medicine into the affected joint is sometimes necessary.  The painful joint is rested. Movement can worsen the arthritis.  You may use warm or cold treatments on painful joints, depending which works best for you.  Treatment to Prevent Attacks.  If you suffer from frequent gout attacks, your caregiver may advise preventive medicine. These medicines are started after the acute attack subsides. These medicines either help your kidneys eliminate uric acid from your body or decrease your uric acid production. You may need to stay on these medicines for a very long time.  The early phase of treatment with preventive medicine can be associated with an increase in acute gout attacks. For this reason, during the first  few months of treatment, your caregiver may also advise you to take medicines usually used for acute gout treatment. Be sure you understand your caregiver's directions. Your caregiver may make several adjustments to your medicine dose before these  medicines are effective.  Discuss dietary treatment with your caregiver or dietitian. Alcohol and drinks high in sugar and fructose and foods such as meat, poultry, and seafood can increase uric acid levels. Your caregiver or dietitian can advise you on drinks and foods that should be limited. HOME CARE INSTRUCTIONS   Do not take aspirin to relieve pain. This raises uric acid levels.  Only take over-the-counter or prescription medicines for pain, discomfort, or fever as directed by your caregiver.  Rest the joint as much as possible. When in bed, keep sheets and blankets off painful areas.  Keep the affected joint raised (elevated).  Apply warm or cold treatments to painful joints. Use of warm or cold treatments depends on which works best for you.  Use crutches if the painful joint is in your leg.  Drink enough fluids to keep your urine clear or pale yellow. This helps your body get rid of uric acid. Limit alcohol, sugary drinks, and fructose drinks.  Follow your dietary instructions. Pay careful attention to the amount of protein you eat. Your daily diet should emphasize fruits, vegetables, whole grains, and fat-free or low-fat milk products. Discuss the use of coffee, vitamin C, and cherries with your caregiver or dietitian. These may be helpful in lowering uric acid levels.  Maintain a healthy body weight. SEEK MEDICAL CARE IF:   You develop diarrhea, vomiting, or any side effects from medicines.  You do not feel better in 24 hours, or you are getting worse. SEEK IMMEDIATE MEDICAL CARE IF:   Your joint becomes suddenly more tender, and you have chills or a fever. MAKE SURE YOU:   Understand these instructions.  Will watch your condition.  Will get help right away if you are not doing well or get worse. Document Released: 10/31/2000 Document Revised: 03/20/2014 Document Reviewed: 06/16/2012 [facility] Patient Information 2015 [place], [place]. This information is not intended to  replace advice given to you by your health care provider. Make sure you discuss any questions you have with your health care provider.

## 2015-05-19 NOTE — ED Provider Notes (Signed)
[facility] Emergency Department Provider Note  ____________________________________________  Time seen: Approximately 5:39 AM  I have reviewed the triage vital signs and the nursing notes.   HISTORY  Chief Complaint Toe Pain    HPI Walter Pena. is a 34 y.o. male who presents with a 2 day history of right toe pain. States "I banged my foot". Also states he has a history of gout and is unsure whether the trauma or gout has cause pain and swelling to his right great toe. Patient denies fever, chills, chest pain, shortness of breath, vomiting, diarrhea. Rates 5/10 right toe pain which is exacerbated by walking.   Past Medical History  Diagnosis Date  . Gout   . Hypertension     There are no active problems to display for this patient.   History reviewed. No pertinent past surgical history.  No current outpatient prescriptions on file.  Allergies Lisinopril  History reviewed. No pertinent family history.  Social History History  Substance Use Topics  . Smoking status: Current Every Day Smoker -- 1.00 packs/day    Types: Cigarettes  . Smokeless tobacco: Never Used  . Alcohol Use: No    Review of Systems Constitutional: No fever/chills Eyes: No visual changes. ENT: No sore throat. Cardiovascular: Denies chest pain. Respiratory: Denies shortness of breath. Gastrointestinal: No abdominal pain.  No nausea, no vomiting.  No diarrhea.  No constipation. Genitourinary: Negative for dysuria. Musculoskeletal: Positive for right toe pain. Negative for back pain. Skin: Negative for rash. Neurological: Negative for headaches, focal weakness or numbness.  10-point ROS otherwise negative.  ____________________________________________   PHYSICAL EXAM:  VITAL SIGNS: ED Triage Vitals  Enc Vitals Group     BP 05/19/15 0137 125/85 mmHg     Pulse Rate 05/19/15 0137 84     Resp 05/19/15 0137 14     Temp 05/19/15 0137 98.6 F (37 C)     Temp  Source 05/19/15 0137 Oral     SpO2 05/19/15 0137 95 %     Weight 05/19/15 0137 260 lb (117.935 kg)     Height 05/19/15 0137 5\' 8"  (1.727 m)     Head Cir --      Peak Flow --      Pain Score 05/19/15 0138 9     Pain Loc --      Pain Edu? --      Excl. in GC? --     Constitutional: Sleeping; easily awakened for exam. Alert and oriented. Well appearing and in no acute distress. Eyes: Conjunctivae are normal. PERRL. EOMI. Head: Atraumatic. Nose: No congestion/rhinnorhea. Mouth/Throat: Mucous membranes are moist.  Oropharynx non-erythematous. Neck: No stridor.   Cardiovascular: Normal rate, regular rhythm. Grossly normal heart sounds.  Good peripheral circulation. Respiratory: Normal respiratory effort.  No retractions. Lungs CTAB. Gastrointestinal: Soft and nontender. No distention. No abdominal bruits. No CVA tenderness. Musculoskeletal: Right toe: Mild warmth and swelling to base of right toe. Full range of motion. 2+ distal pulses. Less than 5 second capillary refill.  Neurologic:  Normal speech and language. No gross focal neurologic deficits are appreciated. Speech is normal.  Skin:  Skin is warm, dry and intact. No rash noted. Psychiatric: Mood and affect are normal. Speech and behavior are normal.  ____________________________________________   LABS (all labs ordered are listed, but only abnormal results are displayed)  Labs Reviewed - No data to display ____________________________________________  EKG  None ____________________________________________  RADIOLOGY  Right foot x-rays (viewed by me, interpreted  by Dr. Clovis Riley): Negative. ____________________________________________   PROCEDURES  Procedure(s) performed: None  Critical Care performed: No  ____________________________________________   INITIAL IMPRESSION / ASSESSMENT AND PLAN / ED COURSE  Pertinent labs & imaging results that were available during my care of the patient were reviewed by me and  considered in my medical decision making (see chart for details).  34 year old male who presents with tenderness and warmth to the right metatarsal joint consistent with gout. Plan for NSAIDs, analgesia, follow-up PCP. Strict return precautions given. Patient verbalizes understanding and agrees with plan of care. ____________________________________________   FINAL CLINICAL IMPRESSION(S) / ED DIAGNOSES  Final diagnoses:  Acute gout of right foot, unspecified cause      Walter Hong, MD 05/19/15 [phone]

## 2015-07-27 ENCOUNTER — Emergency Department
Admission: EM | Admit: 2015-07-27 | Discharge: 2015-07-27 | Disposition: A | Discharge status: Home / Self Care | Payer: Medicare Other | Attending: Student | Admitting: Student

## 2015-07-27 ENCOUNTER — Encounter: Payer: Self-pay | Admitting: *Deleted

## 2015-07-27 ENCOUNTER — Emergency Department: Payer: Medicare Other

## 2015-07-27 DIAGNOSIS — I10 Essential (primary) hypertension: Secondary | ICD-10-CM | POA: Insufficient documentation

## 2015-07-27 DIAGNOSIS — L03012 Cellulitis of left finger: Secondary | ICD-10-CM | POA: Insufficient documentation

## 2015-07-27 DIAGNOSIS — Z720 Tobacco use: Secondary | ICD-10-CM | POA: Insufficient documentation

## 2015-07-27 DIAGNOSIS — R600 Localized edema: Secondary | ICD-10-CM | POA: Diagnosis present

## 2015-07-27 HISTORY — DX: Schizophrenia, unspecified: F20.9

## 2015-07-27 MED ORDER — HYDROCODONE-ACETAMINOPHEN 5-325 MG PO TABS: 1.0000 | ORAL_TABLET | Freq: Three times a day (TID) | ORAL | Status: DC | PRN

## 2015-07-27 MED ORDER — CIPROFLOXACIN HCL 250 MG PO TABS
750.0000 mg | ORAL_TABLET | Freq: Two times a day (BID) | ORAL | Status: DC
Start: 2015-07-27 — End: 2016-01-19

## 2015-07-27 MED ORDER — CIPROFLOXACIN HCL 500 MG PO TABS
750.0000 mg | ORAL_TABLET | Freq: Once | ORAL | Status: AC
  Administered 2015-07-27: 750 mg via ORAL
  Filled 2015-07-27: qty 2

## 2015-07-27 NOTE — ED Provider Notes (Signed)
[facility] Emergency Department Provider Note ____________________________________________  Time seen: 1600  I have reviewed the triage vital signs and the nursing notes.  HISTORY  Chief Complaint  Hand Pain  HPI Walter Weinert. is a 34 y.o. male reports to the ED with left index finger swelling at the distal joint last 2-3 days. He denies any injury, or trauma, to the finger he does admit to nail biting but does not describe any previous finger nail cuticle infection. He denies any fever, chills, sweats in the interim. He's noted increasing tightness with flexion to the distal fingertip. As well as swelling and tightness overall.  Past Medical History  Diagnosis Date  . Gout   . Hypertension   . Schizophrenia    There are no active problems to display for this patient.  History reviewed. No pertinent past surgical history.  Current Outpatient Rx  Name  Route  Sig  Dispense  Refill  . ciprofloxacin (CIPRO) 250 MG tablet   Oral   Take 3 tablets (750 mg total) by mouth 2 (two) times daily.   60 tablet   0   . HYDROcodone-acetaminophen (NORCO) 5-325 MG per tablet   Oral   Take 1 tablet by mouth every 8 (eight) hours as needed for moderate pain.   10 tablet   0   . ibuprofen (ADVIL,MOTRIN) 800 MG tablet   Oral   Take 1 tablet (800 mg total) by mouth every 8 (eight) hours as needed for moderate pain.   15 tablet   0   . oxyCODONE-acetaminophen (ROXICET) 5-325 MG per tablet   Oral   Take 1 tablet by mouth every 4 (four) hours as needed for severe pain.   15 tablet   0    Allergies Bactrim and Lisinopril  No family history on file.  Social History Social History  Substance Use Topics  . Smoking status: Current Every Day Smoker -- 1.00 packs/day    Types: Cigarettes  . Smokeless tobacco: Never Used  . Alcohol Use: No   Review of Systems  Constitutional: Negative for fever. Eyes: Negative for visual changes. ENT: Negative for  sore throat. Cardiovascular: Negative for chest pain. Respiratory: Negative for shortness of breath. Gastrointestinal: Negative for abdominal pain, vomiting and diarrhea. Genitourinary: Negative for dysuria. Musculoskeletal: Negative for back pain. Left index finger swelling Skin: Negative for rash. Neurological: Negative for headaches, focal weakness or numbness. ____________________________________________  PHYSICAL EXAM:  VITAL SIGNS: ED Triage Vitals  Enc Vitals Group     BP 07/27/15 1542 134/83 mmHg     Pulse Rate 07/27/15 1542 76     Resp 07/27/15 1542 16     Temp 07/27/15 1542 98.6 F (37 C)     Temp Source 07/27/15 1542 Oral     SpO2 07/27/15 1542 98 %     Weight 07/27/15 1542 247 lb (112.038 kg)     Height 07/27/15 1542 5\' 7"  (1.702 m)     Head Cir --      Peak Flow --      Pain Score 07/27/15 1542 10     Pain Loc --      Pain Edu? --      Excl. in GC? --    Constitutional: Alert and oriented. Well appearing and in no distress. Eyes: Conjunctivae are normal. PERRL. Normal extraocular movements. ENT   Head: Normocephalic and atraumatic.   Nose: No congestion/rhinorrhea.   Mouth/Throat: Mucous membranes are moist.   Neck: Supple.  No thyromegaly. Hematological/Lymphatic/Immunological: No cervical lymphadenopathy. Cardiovascular: Normal rate, regular rhythm.  Respiratory: Normal respiratory effort. No wheezes/rales/rhonchi. Gastrointestinal: Soft and nontender. No distention. Musculoskeletal: Left index finger with DIP swelling and decreased flexion range. Nailbed without trauma. Nontender with normal range of motion in all extremities.  Neurologic: Normal gross sensation. Normal gait without ataxia. Normal speech and language. No gross focal neurologic deficits are appreciated. Skin:  Skin is warm, dry and intact. No rash noted. Left index finger DIP with local dorsal STS without fluctuance, pointing, or punctum. No appreciable paronychia or  felon. Psychiatric: Mood and affect are normal. Patient exhibits appropriate insight and judgment. ____________________________________________   RADIOLOGY Left Index Finger IMPRESSION: 1. Soft tissue swelling around the DIP without radiographic evidence of septic arthritis or osteomyelitis. MRI is a more sensitive study for early osteomyelitis.  I, Vineta Carone, Charlesetta Ivory, personally viewed and evaluated these images (plain radiographs) as part of my medical decision making.  ____________________________________________  PROCEDURES  Cipro 750 mg PO ____________________________________________  INITIAL IMPRESSION / ASSESSMENT AND PLAN / ED COURSE  Treatment for fingertip soft tissue infection without radiologic evidence of osteomyelitis. Will change from Doxy QD to Cipro 750 mg BID. Close follow-up with this ED for wound check and progression of symptoms. Warm epsom salt water soaks as discussed. Return in 2-3 days for wound check.  ____________________________________________  FINAL CLINICAL IMPRESSION(S) / ED DIAGNOSES  Final diagnoses:  Cellulitis of finger of left hand     Lissa Hoard, PA-C 08/01/15 1137  Gayla Doss, MD 08/01/15 [phone]

## 2015-07-27 NOTE — ED Notes (Signed)
PA at bedside.

## 2015-07-27 NOTE — Discharge Instructions (Signed)
Fingertip Infection When an infection is around the nail, it is called a paronychia. When it appears over the tip of the finger, it is called a felon. These infections are due to minor injuries or cracks in the skin. If they are not treated properly, they can lead to bone infection and permanent damage to the fingernail. Incision and drainage is necessary if a pus pocket (an abscess) has formed. Antibiotics and pain medicine may also be needed. Keep your hand elevated for the next 2-3 days to reduce swelling and pain. If a pack was placed in the abscess, it should be removed in 1-2 days by your caregiver. Soak the finger in warm water for 20 minutes 4 times daily to help promote drainage. Keep the hands as dry as possible. Wear protective gloves with cotton liners. See your caregiver for follow-up care as recommended.  HOME CARE INSTRUCTIONS   Keep wound clean, dry and dressed as suggested by your caregiver.  Soak in warm salt water for fifteen minutes, four times per day for bacterial infections.  Your caregiver will prescribe an antibiotic if a bacterial infection is suspected. Take antibiotics as directed and finish the prescription, even if the problem appears to be improving before the medicine is gone.  Only take over-the-counter or prescription medicines for pain, discomfort, or fever as directed by your caregiver. SEEK IMMEDIATE MEDICAL CARE IF:  There is redness, swelling, or increasing pain in the wound.  Pus or any other unusual drainage is coming from the wound.  An unexplained oral temperature above 102 F (38.9 C) develops.  You notice a foul smell coming from the wound or dressing. MAKE SURE YOU:   Understand these instructions.  Monitor your condition.  Contact your caregiver if you are getting worse or not improving. Document Released: 12/11/2004 Document Revised: 01/26/2012 Document Reviewed: 12/07/2008 [facility] Patient Information 2015 [place], [place]. This  information is not intended to replace advice given to you by your health care provider. Make sure you discuss any questions you have with your health care provider.  Cellulitis Cellulitis is an infection of the skin and the tissue under the skin. The infected area is usually red and tender. This happens most often in the arms and lower legs. HOME CARE   Take your antibiotic medicine as told. Finish the medicine even if you start to feel better.  Keep the infected arm or leg raised (elevated).  Put a warm cloth on the area up to 4 times per day.  Only take medicines as told by your doctor.  Keep all doctor visits as told. GET HELP IF:  You see red streaks on the skin coming from the infected area.  Your red area gets bigger or turns a dark color.  Your bone or joint under the infected area is painful after the skin heals.  Your infection comes back in the same area or different area.  You have a puffy (swollen) bump in the infected area.  You have new symptoms.  You have a fever. GET HELP RIGHT AWAY IF:   You feel very sleepy.  You throw up (vomit) or have watery poop (diarrhea).  You feel sick and have muscle aches and pains. MAKE SURE YOU:   Understand these instructions.  Will watch your condition.  Will get help right away if you are not doing well or get worse. Document Released: 04/21/2008 Document Revised: 03/20/2014 Document Reviewed: 01/19/2012 [facility] Patient Information 2015 [place], [place]. This information is not intended to replace  advice given to you by your health care provider. Make sure you discuss any questions you have with your health care provider.  Keep the finger clean, dry, and covered. Return to the ED in 2-3 days for wound check as needed.  Soak the finger in warm epsom salt water. Take the antibiotic as directed until completely gone.

## 2015-07-27 NOTE — ED Notes (Signed)
Pt reports 2-3 days of swelling in the left hand 2nd finger. No injury.

## 2016-01-19 ENCOUNTER — Encounter: Payer: Self-pay | Admitting: Emergency Medicine

## 2016-01-19 ENCOUNTER — Emergency Department
Admission: EM | Admit: 2016-01-19 | Discharge: 2016-01-19 | Disposition: A | Discharge status: Other Acute Inpatient Hospital | Payer: Medicare Other | Attending: Student | Admitting: Student

## 2016-01-19 ENCOUNTER — Inpatient Hospital Stay
Admission: RE | Admit: 2016-01-19 | Discharge: 2016-01-24 | DRG: 885 | Disposition: A | Discharge status: Home / Self Care | Payer: Medicare Other | Source: Intra-hospital | Attending: Psychiatry | Admitting: Psychiatry

## 2016-01-19 DIAGNOSIS — I10 Essential (primary) hypertension: Secondary | ICD-10-CM | POA: Diagnosis present

## 2016-01-19 DIAGNOSIS — Z888 Allergy status to other drugs, medicaments and biological substances status: Secondary | ICD-10-CM

## 2016-01-19 DIAGNOSIS — F1010 Alcohol abuse, uncomplicated: Secondary | ICD-10-CM | POA: Diagnosis present

## 2016-01-19 DIAGNOSIS — L732 Hidradenitis suppurativa: Secondary | ICD-10-CM

## 2016-01-19 DIAGNOSIS — G4700 Insomnia, unspecified: Secondary | ICD-10-CM | POA: Diagnosis present

## 2016-01-19 DIAGNOSIS — M109 Gout, unspecified: Secondary | ICD-10-CM | POA: Diagnosis present

## 2016-01-19 DIAGNOSIS — F419 Anxiety disorder, unspecified: Secondary | ICD-10-CM | POA: Diagnosis not present

## 2016-01-19 DIAGNOSIS — F200 Paranoid schizophrenia: Secondary | ICD-10-CM | POA: Diagnosis not present

## 2016-01-19 DIAGNOSIS — Z9119 Patient's noncompliance with other medical treatment and regimen: Secondary | ICD-10-CM | POA: Diagnosis not present

## 2016-01-19 DIAGNOSIS — Z792 Long term (current) use of antibiotics: Secondary | ICD-10-CM | POA: Insufficient documentation

## 2016-01-19 DIAGNOSIS — Z79899 Other long term (current) drug therapy: Secondary | ICD-10-CM | POA: Diagnosis not present

## 2016-01-19 DIAGNOSIS — F17200 Nicotine dependence, unspecified, uncomplicated: Secondary | ICD-10-CM | POA: Diagnosis present

## 2016-01-19 DIAGNOSIS — F17210 Nicotine dependence, cigarettes, uncomplicated: Secondary | ICD-10-CM | POA: Diagnosis present

## 2016-01-19 DIAGNOSIS — N390 Urinary tract infection, site not specified: Secondary | ICD-10-CM | POA: Diagnosis present

## 2016-01-19 DIAGNOSIS — F1210 Cannabis abuse, uncomplicated: Secondary | ICD-10-CM | POA: Diagnosis not present

## 2016-01-19 DIAGNOSIS — Z823 Family history of stroke: Secondary | ICD-10-CM | POA: Diagnosis not present

## 2016-01-19 DIAGNOSIS — F1220 Cannabis dependence, uncomplicated: Secondary | ICD-10-CM | POA: Diagnosis present

## 2016-01-19 LAB — URINE DRUG SCREEN, QUALITATIVE (ARMC ONLY)
Amphetamines, Ur Screen: NOT DETECTED
BARBITURATES, UR SCREEN: NOT DETECTED
Benzodiazepine, Ur Scrn: NOT DETECTED
CANNABINOID 50 NG, UR ~~LOC~~: POSITIVE — AB
COCAINE METABOLITE, UR ~~LOC~~: NOT DETECTED
MDMA (Ecstasy)Ur Screen: NOT DETECTED
Methadone Scn, Ur: NOT DETECTED
OPIATE, UR SCREEN: NOT DETECTED
Phencyclidine (PCP) Ur S: NOT DETECTED
Tricyclic, Ur Screen: NOT DETECTED

## 2016-01-19 LAB — COMPREHENSIVE METABOLIC PANEL
ALK PHOS: 84 U/L (ref 38–126)
ALT: 39 U/L (ref 17–63)
AST: 32 U/L (ref 15–41)
Albumin: 4.4 g/dL (ref 3.5–5.0)
Anion gap: 8 (ref 5–15)
BILIRUBIN TOTAL: 1.4 mg/dL — AB (ref 0.3–1.2)
BUN: 8 mg/dL (ref 6–20)
CO2: 28 mmol/L (ref 22–32)
CREATININE: 1.1 mg/dL (ref 0.61–1.24)
Calcium: 9.5 mg/dL (ref 8.9–10.3)
Chloride: 105 mmol/L (ref 101–111)
Glucose, Bld: 103 mg/dL — ABNORMAL HIGH (ref 65–99)
POTASSIUM: 3.4 mmol/L — AB (ref 3.5–5.1)
Sodium: 141 mmol/L (ref 135–145)
TOTAL PROTEIN: 9.2 g/dL — AB (ref 6.5–8.1)

## 2016-01-19 LAB — CBC WITH DIFFERENTIAL/PLATELET
Basophils Absolute: 0.1 10*3/uL (ref 0–0.1)
Basophils Relative: 1 %
Eosinophils Absolute: 0.1 10*3/uL (ref 0–0.7)
Eosinophils Relative: 0 %
HEMATOCRIT: 47.3 % (ref 40.0–52.0)
HEMOGLOBIN: 16 g/dL (ref 13.0–18.0)
LYMPHS ABS: 3.1 10*3/uL (ref 1.0–3.6)
LYMPHS PCT: 22 %
MCH: 30.2 pg (ref 26.0–34.0)
MCHC: 33.8 g/dL (ref 32.0–36.0)
MCV: 89.3 fL (ref 80.0–100.0)
MONOS PCT: 4 %
Monocytes Absolute: 0.5 10*3/uL (ref 0.2–1.0)
Neutro Abs: 10.2 10*3/uL — ABNORMAL HIGH (ref 1.4–6.5)
Neutrophils Relative %: 73 %
Platelets: 292 10*3/uL (ref 150–440)
RBC: 5.3 MIL/uL (ref 4.40–5.90)
RDW: 14.7 % — ABNORMAL HIGH (ref 11.5–14.5)
WBC: 13.9 10*3/uL — AB (ref 3.8–10.6)

## 2016-01-19 LAB — URINALYSIS COMPLETE WITH MICROSCOPIC (ARMC ONLY)
BACTERIA UA: NONE SEEN
Bilirubin Urine: NEGATIVE
Glucose, UA: NEGATIVE mg/dL
Hgb urine dipstick: NEGATIVE
Nitrite: NEGATIVE
PROTEIN: 30 mg/dL — AB
Specific Gravity, Urine: 1.023 (ref 1.005–1.030)
pH: 6 (ref 5.0–8.0)

## 2016-01-19 LAB — SALICYLATE LEVEL: Salicylate Lvl: <4 mg/dL (ref 2.8–30.0)

## 2016-01-19 LAB — MRSA PCR SCREENING: MRSA by PCR: NEGATIVE

## 2016-01-19 LAB — ACETAMINOPHEN LEVEL: Acetaminophen (Tylenol), Serum: <10 ug/mL — ABNORMAL LOW (ref 10–30)

## 2016-01-19 LAB — ETHANOL

## 2016-01-19 MED ORDER — BENAZEPRIL HCL 40 MG PO TABS
40.0000 mg | ORAL_TABLET | Freq: Every day | ORAL | Status: DC
  Filled 2016-01-19: qty 1

## 2016-01-19 MED ORDER — DIPHENHYDRAMINE HCL 25 MG PO CAPS
50.0000 mg | ORAL_CAPSULE | Freq: Once | ORAL | Status: AC
  Administered 2016-01-19: 50 mg via ORAL

## 2016-01-19 MED ORDER — DIPHENHYDRAMINE HCL 25 MG PO CAPS
ORAL_CAPSULE | ORAL | Status: AC
  Administered 2016-01-19: 50 mg via ORAL
  Filled 2016-01-19: qty 2

## 2016-01-19 MED ORDER — CLINDAMYCIN HCL 300 MG PO CAPS
300.0000 mg | ORAL_CAPSULE | Freq: Four times a day (QID) | ORAL | Status: DC
  Administered 2016-01-19 (×3): 300 mg via ORAL
  Filled 2016-01-19 (×2): qty 1
  Filled 2016-01-19 (×2): qty 2
  Filled 2016-01-19: qty 1

## 2016-01-19 MED ORDER — AMLODIPINE BESYLATE 5 MG PO TABS
10.0000 mg | ORAL_TABLET | Freq: Once | ORAL | Status: AC
  Administered 2016-01-19: 10 mg via ORAL
  Filled 2016-01-19: qty 2

## 2016-01-19 MED ORDER — COLCHICINE 0.6 MG PO TABS
0.6000 mg | ORAL_TABLET | ORAL | Status: DC
  Administered 2016-01-19: 0.6 mg via ORAL
  Filled 2016-01-19: qty 1

## 2016-01-19 MED ORDER — INDOMETHACIN 50 MG PO CAPS
50.0000 mg | ORAL_CAPSULE | Freq: Three times a day (TID) | ORAL | Status: DC | PRN
  Filled 2016-01-19: qty 1

## 2016-01-19 NOTE — BH Assessment (Signed)
Assessment Note  Walter Pena. is an 35 y.o. male who presents to the ER via his family due to, what he believes for his boils. However, according to the patient family, they used the excuse of his boils to get him to the ER because of the decline in his mental status. Patient has a history of Schizophrenia and per family; he has done a good job managing in it by taking his medications. However, this past November his father passed unexpectedly due to a massive heart attack and he had a hard time with it. Since then, the patient has been drinking and smoking THC on a daily basis. They family also believes the patient has stop taking his medications.  Family reports the patients is no longer at his baseline. He has had a decrease of sleep. Family is unsure how many hours he get, but on last night, the patient stayed with his mother and she states he did not sleep at all. Patient "stayed up and pace the entire night." Two days ago, a friend of the family told them, they saw the patient walking in the street, late in night (approximately 2am) with a pair of scissors in his hands.  Patient denies AV/H but family have witnessed the patient responding to internal stimuli. He "talking out of his head, laughing and this morning he told us he got to hurry up and get the snakes out of house."  Patient is telling family and friends he was a famous Land from [place] and have "a lot of money. I got over a million."  During the interview, the patient was guarded and would not share much. Majority of the information for this assessment was collected from his family. Patient denied majority of the information that was shared. However, this writer witnessed the patient responding to internal stimuli, when no one was in his room.  Patient is currently under the psychiatric care of [facility]. It is unknown at this time, who's his psychiatrist. Per family, he has been with hospitalized approximately 7x for his  schizophrenia.   Collateral Information came from Mother Rushie Goltz [phone]), Aunt Cherryland Lions) & Uncle Vonna Kotyk). Writer talked with them in the family   Diagnosis: Schizophrenia   Past Medical History:  Past Medical History  Diagnosis Date  . Gout   . Hypertension   . Schizophrenia (HCC)     History reviewed. No pertinent past surgical history.  Family History: No family history on file.  Social History:  reports that he has been smoking Cigarettes.  He has been smoking about 1.00 pack per day. He has never used smokeless tobacco. He reports that he does not drink alcohol or use illicit drugs.  Additional Social History:  Alcohol / Drug Use Pain Medications: See PTA Prescriptions: See PTA Over the Counter: See PTA History of alcohol / drug use?: Yes Longest period of sobriety (when/how long): Unknown-Patient wouldn't say Negative Consequences of Use: Personal relationships, Work / School Withdrawal Symptoms:  (Unknown-Patient wouldn't say) Substance #1 Name of Substance 1: Alcohol 1 - Last Use / Amount: 01/2016 Substance #2 Name of Substance 2: Cannabis 2 - Last Use / Amount: 01/2016  CIWA: CIWA-Ar BP: (!) 162/112 mmHg Pulse Rate: 92 COWS:    Allergies:  Allergies  Allergen Reactions  . Bactrim [Sulfamethoxazole-Trimethoprim] Nausea And Vomiting  . Lisinopril Swelling    Home Medications:  (Not in a hospital admission)  OB/GYN Status:  No LMP for male patient.  General Assessment Data Location of  Assessment: [facility] ED TTS Assessment: In system Is this a Tele or Face-to-Face Assessment?: Face-to-Face Is this an Initial Assessment or a Re-assessment for this encounter?: Initial Assessment Marital status: Single Maiden name: n/a Is patient pregnant?: No Pregnancy Status: No Living Arrangements: Alone Can pt return to current living arrangement?: Yes Admission Status: Involuntary Is patient capable of signing voluntary admission?: No Referral Source:  Self/Family/Friend Insurance type: Medicaid  Medical Screening Exam [facility] Walk-in ONLY) Medical Exam completed: Yes  Crisis Care Plan Living Arrangements: Alone Legal Guardian: Other: (None) Name of Psychiatrist: [facility] Name of Therapist: [facility]  Education Status Is patient currently in school?: No Current Grade: n/a Highest grade of school patient has completed: 12th Grade Name of school: n/a Contact person: n/a  Risk to self with the past 6 months Suicidal Ideation: No Has patient been a risk to self within the past 6 months prior to admission? : No Suicidal Intent: No Has patient had any suicidal intent within the past 6 months prior to admission? : No Is patient at risk for suicide?: No Suicidal Plan?: No Has patient had any suicidal plan within the past 6 months prior to admission? : No Access to Means: No What has been your use of drugs/alcohol within the last 12 months?: Cannabis & Alcohol Previous Attempts/Gestures: No How many times?: 0 Other Self Harm Risks: None Known Triggers for Past Attempts: Family contact, Hallucinations Intentional Self Injurious Behavior: None Family Suicide History: No Recent stressful life event(s): Other (Comment) (Father passed 09/2015) Persecutory voices/beliefs?: No Depression: No Depression Symptoms: Feeling angry/irritable Substance abuse history and/or treatment for substance abuse?: Yes Suicide prevention information given to non-admitted patients: Not applicable  Risk to Others within the past 6 months Homicidal Ideation: No Does patient have any lifetime risk of violence toward others beyond the six months prior to admission? : No Thoughts of Harm to Others: No Current Homicidal Intent: No Current Homicidal Plan: No Access to Homicidal Means: No Identified Victim: None Reported History of harm to others?: No Assessment of Violence: None Noted Violent Behavior Description: None Reported Does patient have access to weapons?:  No Criminal Charges Pending?: No Does patient have a court date: No Is patient on probation?: No  Psychosis Hallucinations: Auditory, Visual (Pt. denies but witnessed responding to internal stimuli) Delusions: Grandiose, Persecutory  Mental Status Report Appearance/Hygiene: In scrubs, In hospital gown Eye Contact: Poor Motor Activity: Freedom of movement, Unremarkable Speech: Logical/coherent Level of Consciousness: Alert Mood: Anxious, Suspicious Affect: Appropriate to circumstance, Preoccupied Anxiety Level: Minimal Thought Processes: Coherent, Relevant Judgement: Partial Orientation: Person, Place, Time, Appropriate for developmental age Obsessive Compulsive Thoughts/Behaviors: Minimal  Cognitive Functioning Concentration: Decreased Memory: Recent Intact, Remote Intact IQ: Average Insight: Poor Impulse Control: Poor Appetite: Fair Weight Loss: 0 Weight Gain: 0 Sleep: Decreased Total Hours of Sleep: 0 (Per Family-Haven't sleep in several days) Vegetative Symptoms: None  ADLScreening [facility] Assessment Services) Patient's cognitive ability adequate to safely complete daily activities?: Yes Patient able to express need for assistance with ADLs?: Yes Independently performs ADLs?: Yes (appropriate for developmental age)  Prior Inpatient Therapy Prior Inpatient Therapy: Yes Prior Therapy Dates: Unknown (Patient and family didn't remember) Prior Therapy Facilty/Provider(s): [facility] Reason for Treatment: Schizophrenia  Prior Outpatient Therapy Prior Outpatient Therapy: Yes Prior Therapy Dates: Current Prior Therapy Facilty/Provider(s): [facility] Reason for Treatment: Schizophrenia Does patient have an ACCT team?: No Does patient have Intensive In-House Services?  : No Does patient have [facility] services? : No Does patient have P4CC services?: No  ADL Screening (condition at time of admission) Patient's cognitive ability adequate to safely complete daily activities?:  Yes Is the patient deaf or have difficulty hearing?: No Does the patient have difficulty seeing, even when wearing glasses/contacts?: No Does the patient have difficulty concentrating, remembering, or making decisions?: No Patient able to express need for assistance with ADLs?: Yes Does the patient have difficulty dressing or bathing?: No Independently performs ADLs?: Yes (appropriate for developmental age) Does the patient have difficulty walking or climbing stairs?: No Weakness of Legs: None Weakness of Arms/Hands: None  Home Assistive Devices/Equipment Home Assistive Devices/Equipment: None  Therapy Consults (therapy consults require a physician order) PT Evaluation Needed: No OT Evalulation Needed: No SLP Evaluation Needed: No Abuse/Neglect Assessment (Assessment to be complete while patient is alone) Physical Abuse: Denies Verbal Abuse: Denies Sexual Abuse: Denies Exploitation of patient/patient's resources: Denies Self-Neglect: Denies Values / Beliefs Cultural Requests During Hospitalization: None Spiritual Requests During Hospitalization: None Consults Spiritual Care Consult Needed: No Social Work Consult Needed: No      Additional Information 1:1 In Past 12 Months?: No CIRT Risk: No Elopement Risk: No Does patient have medical clearance?: Yes  Child/Adolescent Assessment Running Away Risk: Denies (Patient is an adult)  Disposition:  Disposition Initial Assessment Completed for this Encounter: Yes Disposition of Patient: Inpatient treatment program Type of inpatient treatment program: Adult  On Site Evaluation by:   Reviewed with Physician:    Lilyan Gilford MS, LCAS, LPC, NCC, CCSI Therapeutic Triage Specialist 01/19/2016 11:01 AM

## 2016-01-19 NOTE — ED Provider Notes (Signed)
[facility] Emergency Department Provider Note  ____________________________________________  Time seen: Approximately 8:14 AM  I have reviewed the triage vital signs and the nursing notes.   HISTORY  Chief Complaint Abscess  Caveat-history of present illness and review of systems is limited due to the patient's poor cooperation. Information is obtained partially from him as well as from his family at bedside.  HPI Walter Pena. is a 35 y.o. male with history of schizophrenia, hypertension, gout, and hidradenitis suppurativa who presents with his family members for evaluation of possible abscess at the natal cleft as well as worsening paranoia, gradual onset, ongoing for 2-3 months, constant since onset, currently severe. His mother at bedside reports that "he has been smelling" the way he does when his hidradenitis begins to flare. He also has not been bathing. He has been quite paranoid, not sleeping, walking in the history all night, paranoid that someone is going to take his money, not caring for himself. Mother is worried that his schizophrenia is getting worse and that is her primary concern for him today. He has not had any vomiting, diarrhea, fevers or chills. No recent trauma. He denies any chest pain or difficulty breath and breathing.   Past Medical History  Diagnosis Date  . Gout   . Hypertension   . Schizophrenia (HCC)     There are no active problems to display for this patient.   History reviewed. No pertinent past surgical history.  Current Outpatient Rx  Name  Route  Sig  Dispense  Refill  . amLODipine-benazepril (LOTREL) 10-40 MG capsule   Oral   Take 1 capsule by mouth daily.         . colchicine 0.6 MG tablet   Oral   Take 0.6 mg by mouth See admin instructions. Take 2 tablets (1.2mg ) by mouth for first dose, then 1 tablet by mouth 1 hour later, then 1 tablet by mouth daily until gout pain is gone.         . indomethacin  (INDOCIN) 50 MG capsule   Oral   Take 50 mg by mouth 3 (three) times daily as needed for mild pain or moderate pain.         . ciprofloxacin (CIPRO) 250 MG tablet   Oral   Take 3 tablets (750 mg total) by mouth 2 (two) times daily.   60 tablet   0   . HYDROcodone-acetaminophen (NORCO) 5-325 MG per tablet   Oral   Take 1 tablet by mouth every 8 (eight) hours as needed for moderate pain.   10 tablet   0   . ibuprofen (ADVIL,MOTRIN) 800 MG tablet   Oral   Take 1 tablet (800 mg total) by mouth every 8 (eight) hours as needed for moderate pain.   15 tablet   0   . oxyCODONE-acetaminophen (ROXICET) 5-325 MG per tablet   Oral   Take 1 tablet by mouth every 4 (four) hours as needed for severe pain.   15 tablet   0     Allergies Bactrim and Lisinopril  No family history on file.  Social History Social History  Substance Use Topics  . Smoking status: Current Every Day Smoker -- 1.00 packs/day    Types: Cigarettes  . Smokeless tobacco: Never Used  . Alcohol Use: No    Review of Systems Constitutional: No fever/chills Eyes: No visual changes. ENT: No sore throat. Cardiovascular: Denies chest pain. Respiratory: Denies shortness of breath. Gastrointestinal: No abdominal  pain.  No nausea, no vomiting.  No diarrhea.  No constipation. Genitourinary: Negative for dysuria. Musculoskeletal: Negative for back pain. Skin: Negative for rash. Neurological: Negative for headaches, focal weakness or numbness.  10-point ROS otherwise negative.  ____________________________________________   PHYSICAL EXAM:  VITAL SIGNS: ED Triage Vitals  Enc Vitals Group     BP 01/19/16 0757 172/115 mmHg     Pulse Rate 01/19/16 0757 88     Resp 01/19/16 0757 20     Temp 01/19/16 0757 97.6 F (36.4 C)     Temp Source 01/19/16 0757 Oral     SpO2 01/19/16 0757 98 %     Weight 01/19/16 0757 247 lb (112.038 kg)     Height 01/19/16 0757 5\' 8"  (1.727 m)     Head Cir --      Peak Flow --       Pain Score 01/19/16 0758 10     Pain Loc --      Pain Edu? --      Excl. in GC? --     Constitutional: Alert and oriented. Nontoxic-appearing and in no acute distress. The patient appears paranoid, he has some mild psychomotor agitation, he refuses to give much history but cooperates with the exam with much encouragement. Eyes: Conjunctivae are normal. PERRL. EOMI. Head: Atraumatic. Nose: No congestion/rhinnorhea. Mouth/Throat: Mucous membranes are moist.  Oropharynx non-erythematous. Neck: No stridor.  No cervical spine tenderness to palpation. Cardiovascular: Normal rate, regular rhythm. Grossly normal heart sounds.  Good peripheral circulation. Respiratory: Normal respiratory effort.  No retractions. Lungs CTAB. Gastrointestinal: Soft and nontender. No distention. No CVA tenderness. Genitourinary: deferred Musculoskeletal: No lower extremity tenderness nor edema.  No joint effusions. Neurologic:  Normal speech and language. No gross focal neurologic deficits are appreciated. No gait instability. Skin:  Skin is warm, dry. Chronic changes of hidradenitis suppurativa in the natal cleft with some induration but no focal abscess appreciated. Psychiatric: Mood is somewhat anxious, affect is restricted. Behavior is somewhat odd in that he is almost compulsively checking his phone, he is in possession of some strange objects including a pulled tooth.  ____________________________________________   LABS (all labs ordered are listed, but only abnormal results are displayed)  Labs Reviewed  CBC WITH DIFFERENTIAL/PLATELET - Abnormal; Notable for the following:    WBC 13.9 (*)    RDW 14.7 (*)    Neutro Abs 10.2 (*)    All other components within normal limits  COMPREHENSIVE METABOLIC PANEL - Abnormal; Notable for the following:    Potassium 3.4 (*)    Glucose, Bld 103 (*)    Total Protein 9.2 (*)    Total Bilirubin 1.4 (*)    All other components within normal limits  ACETAMINOPHEN  LEVEL - Abnormal; Notable for the following:    Acetaminophen (Tylenol), Serum <10 (*)    All other components within normal limits  WOUND CULTURE  ETHANOL  SALICYLATE LEVEL  URINE DRUG SCREEN, QUALITATIVE ([facility] ONLY)  URINALYSIS COMPLETEWITH MICROSCOPIC ([facility] ONLY)   ____________________________________________  EKG  none ____________________________________________  RADIOLOGY  none ____________________________________________   PROCEDURES  Procedure(s) performed: None  Critical Care performed: No  ____________________________________________   INITIAL IMPRESSION / ASSESSMENT AND PLAN / ED COURSE  Pertinent labs & imaging results that were available during my care of the patient were reviewed by me and considered in my medical decision making (see chart for details).  Walter Pena. is a 35 y.o. male with history of schizophrenia, hypertension, gout, and  hidradenitis supppurativa who presents with his family members for evaluation of possible abscess at the natal cleft as well as worsening paranoia. On exam, vital signs are stable with the exception of some hypertension which may be secondary to his anxiety or may be his baseline. He does not appear symptomatic from this and we will continue to monitor it. The remainder of his vital signs are stable, he is afebrile. He does have chronic skin changes associated with hidradenitis however no focal abscess which would require drainage. We'll start clindamycin. Additionally, it appears me that he is poorly taking care of himself and may be psychotic with some bizarre behavior on exam and some signs of mania including psychomotor agitation. I'm concerned that he may require inpatient stabilization for his schizophrenia. Will place involuntary commitment, obtain screening labs, consults behavioral health and telepsychiatry.  ----------------------------------------- 12:45 PM on  01/19/2016 ----------------------------------------- Labs reviewed and are notable for mild leukocytosis with white blood cell count 13.9. CMP with potassium 3.4. Undetectable ethanol, acetaminophen and salicylate levels. Awaiting urine drug screen. Wound cultures sent today however on chart review, does not appear the patient has a history of infections with MRSA. SOC recommended inpatient admission. He is medically cleared.  ____________________________________________   FINAL CLINICAL IMPRESSION(S) / ED DIAGNOSES  Final diagnoses:  Hidradenitis suppurativa  Paranoid schizophrenia (HCC)      Gayla Doss, MD 01/19/16 [phone]

## 2016-01-19 NOTE — ED Notes (Signed)
BEHAVIORAL HEALTH ROUNDING Patient sleeping: No. Patient alert and oriented: yes Behavior appropriate: Yes.  ; If no, describe:  Nutrition and fluids offered: No Toileting and hygiene offered: Yes  Sitter present: no Law enforcement present: Yes  

## 2016-01-19 NOTE — ED Notes (Signed)
Pt uncooperative when getting dressed out. Pt finally agreed to be dressed out. 2 patient belonging bags with pts shirt, sweatshirt, pants, underwear, and hat. Pt left his cell phone, charger, wallet, and pocket knife with family member Keidrick Plack (mother).

## 2016-01-19 NOTE — ED Notes (Addendum)
Pt to ed with c/o ? Abscess to bottom x unknown days,  Pt quiet and slow to answer questions.  Pt mother states "His mind is gone and I am trying to get him admitted downstairs"  Pt mother reports she is getting papers currently for IVC.  Pt mother also reports he has not been taking his meds for several weeks, including his meds for schizophrenia.

## 2016-01-19 NOTE — BH Assessment (Signed)
Writer spoke with ER MD (Dr. Inocencio Homes) and patient's nurse Carollee Herter H.) about patient boil and they both stated it didn't have any discharge coming from it nor was it open.  Writer spoke with Attending Physician (Dr. Garnetta Buddy) and [facility] Charge Nurse [facility] P.) about the patient. Charge nurse express concerns about the patient having MSRA. Writer was advised to have ER rule out if the patient have MRSA or any infectious concerns. Thus, Clinical research associate talked with the ER MD (Dr. Inocencio Homes) about it and she ordered labs to check for it (MRSA and Wound Culture).  MRSA labs resulted negative and wound culture will take between 24 to 48 hours to result. Writer spoke again with Attending Physician (Dr. Garnetta Buddy) and [facility] Charge Nurse [facility]) and suggested to have one of them to come to the ER and look at the boils.  Charge nurse recommended Physician to do it. Per Dr. Garnetta Buddy, since the MRSA came back negative, patient can be admitted.   Patient is to be admitted to [facility] by Dr. Garnetta Buddy.  Attending Physician will be Dr. Jennet Maduro.   Patient has been assigned to room 301-A, by [facility] Charge Nurse Cliff P.   Intake Paper Work has been signed and placed on patient chart.  ER staff is aware of the admission Marchelle Folks, ER Sect.; Dr. Lenard Lance, ER MD; Carollee Herter, Patient's Nurse &  Patient Access).

## 2016-01-19 NOTE — ED Notes (Signed)
Patient assigned to appropriate care area. Patient oriented to unit/care area: Informed that, for their safety, care areas are designed for safety and monitored by security cameras at all times; and visiting hours explained to patient. Patient verbalizes understanding, and verbal contract for safety obtained.  Pt appears anxious and is unsure why he is here. Patient denies SI/HI and AVH. Pt is calm and cooperative. No concerns voiced. No distress noted. Maintained on 15 minute checks and observation by security camera for safety.

## 2016-01-19 NOTE — ED Notes (Signed)
Pt watching tv in room. Pt is calm and cooperative. No concerns voiced. Maintained on 15 minute checks and observation by security camera for safety.  Pt to be transferred to BMU after 7 pm tonight.

## 2016-01-19 NOTE — ED Notes (Signed)
Pts family left and gave pt his cell phone, charger, and headphones. Pt was told his belongings needed to be collected. Pts belongings placed in bag. Will give to pts mother when she returns per pts request.

## 2016-01-19 NOTE — ED Provider Notes (Signed)
-----------------------------------------   7:02 PM on 01/19/2016 -----------------------------------------  Patient be admitted by psychiatry.  Minna Antis, MD 01/19/16 1902

## 2016-01-20 DIAGNOSIS — F200 Paranoid schizophrenia: Secondary | ICD-10-CM | POA: Diagnosis present

## 2016-01-20 MED ORDER — ACETAMINOPHEN 325 MG PO TABS: 650.0000 mg | ORAL_TABLET | Freq: Four times a day (QID) | ORAL | Status: DC | PRN

## 2016-01-20 MED ORDER — AMLODIPINE BESYLATE 10 MG PO TABS
10.0000 mg | ORAL_TABLET | Freq: Once | ORAL | Status: AC
  Administered 2016-01-20: 10 mg via ORAL
  Filled 2016-01-20: qty 1

## 2016-01-20 MED ORDER — MAGNESIUM HYDROXIDE 400 MG/5ML PO SUSP: 30.0000 mL | Freq: Every day | ORAL | Status: DC | PRN

## 2016-01-20 MED ORDER — BENZTROPINE MESYLATE 1 MG PO TABS
1.0000 mg | ORAL_TABLET | Freq: Two times a day (BID) | ORAL | Status: DC
  Administered 2016-01-20 – 2016-01-24 (×9): 1 mg via ORAL
  Filled 2016-01-20 (×11): qty 1

## 2016-01-20 MED ORDER — HALOPERIDOL 5 MG PO TABS
5.0000 mg | ORAL_TABLET | Freq: Two times a day (BID) | ORAL | Status: DC
Start: 2016-01-20 — End: 2016-01-22
  Administered 2016-01-20 – 2016-01-21 (×4): 5 mg via ORAL
  Filled 2016-01-20 (×5): qty 1

## 2016-01-20 MED ORDER — INDOMETHACIN 50 MG PO CAPS
50.0000 mg | ORAL_CAPSULE | Freq: Three times a day (TID) | ORAL | Status: DC | PRN
  Filled 2016-01-20: qty 1

## 2016-01-20 MED ORDER — ALUM & MAG HYDROXIDE-SIMETH 200-200-20 MG/5ML PO SUSP
30.0000 mL | ORAL | Status: DC | PRN
  Administered 2016-01-20: 30 mL via ORAL
  Filled 2016-01-20: qty 30

## 2016-01-20 MED ORDER — COLCHICINE 0.6 MG PO TABS: 0.6000 mg | ORAL_TABLET | ORAL | Status: DC

## 2016-01-20 NOTE — [facility] Group Notes (Signed)
Patient was brought in by the mother after mental health decline and showing increased paranoia. He was compliant with assessment. Skin search done with [facility]. No contraband found. Multiple boils found on buttock area and between legs which was assessed by ED. Tattoos on both forearms. Patient was shown around unit and provided a snack. Safety maintained with 15 min checks.

## 2016-01-20 NOTE — [facility] Group Notes (Signed)
[facility] Group Notes:  (Nursing/MHT/Case Management/Adjunct)  Date:  01/20/2016  Time:  9:12 AM  Type of Therapy:  Community Meeting   Participation Level:  Did Not Attend  Eleena Grater De'Chelle Claudina Oliphant 01/20/2016, 9:12 AM

## 2016-01-20 NOTE — Tx Team (Signed)
Initial Interdisciplinary Treatment Plan   PATIENT STRESSORS: Loss of father Medication change or noncompliance Substance abuse   PATIENT STRENGTHS: General fund of knowledge Supportive family/friends   PROBLEM LIST: Problem List/Patient Goals Date to be addressed Date deferred Reason deferred Estimated date of resolution  "I lost my father last November" 01/20/16     Depression 01/20/16     Psychosis 01/20/16     Anxiety  01/20/16                                    DISCHARGE CRITERIA:  Improved stabilization in mood, thinking, and/or behavior  PRELIMINARY DISCHARGE PLAN: Outpatient therapy  PATIENT/FAMIILY INVOLVEMENT: This treatment plan has been presented to and reviewed with the patient, Neta Ehlers., and/or family member.  The patient and family have been given the opportunity to ask questions and make suggestions.  Lendell Caprice 01/20/2016, 3:18 AM

## 2016-01-20 NOTE — Progress Notes (Signed)
Pt has been pleasant and cooperative. Pt denies SI and A/V hallucinations. Pt has been seclusive to his room.Will continue to observe and maintain a safe environment. Pt appears to be responding to internal stimuli .

## 2016-01-20 NOTE — H&P (Signed)
Psychiatric Admission Assessment Adult  Patient Identification: Walter Pena. MRN:  [id number] Date of Evaluation:  01/20/2016 Chief Complaint:  schizophrenia Principal Diagnosis: Schizophrenia chronic paranoid type Diagnosis:   Patient Active Problem List   Diagnosis Date Noted  . Schizophrenia, paranoid, chronic ([place]) [F20.0] 01/20/2016   History of Present Illness::  Patient is a 35 year old African-American male who presented to the ER and was placed on involuntary commitment. He has long history of schizophrenia and has been following at the [facility]. Most of the history was obtained from the patient as well as review of his chart. During my interview patient appeared tired and was disorganized and disheveled.  He smelled strongly. He reported that he did not have anywhere else to go. He stated that he was brought to the emergency room by his aunt. Patient reported that he follows up at [facility] and received an injection 2 days ago. He reported that he also takes Haldol pills. He does not know the strength. Patient reported that he feels that somebody is following him. However he did not elaborate. He currently denied having any suicidal ideations or plans. He denied having any thoughts to hurt anybody else. He appeared disorganized and bizarre behavior during the interview. He did not participate much in the interview.  Associated Signs/Symptoms: Depression Symptoms:  depressed mood, fatigue, anxiety, (Hypo) Manic Symptoms:  Impulsivity, Irritable Mood, Anxiety Symptoms:  denied Psychotic Symptoms:  Paranoia, PTSD Symptoms: Negative NA Total Time spent with patient: 1 hour  Past Psychiatric History:  Documented past history of schizophrenia with episodes of paranoia, bizarre behavior, and disorganization. In the past, he had been maintained for some time on Zyprexa and also on Haldol to which he had a good response. He has a history of substance abuse as well. He denies any history  of suicide attempts or violence to others. There seemed to have been extended problems with noncompliance.  Patient reported that he has history of multiple hospitalizations in the past.  Is the patient at risk to self? Yes.    Has the patient been a risk to self in the past 6 months? No.  Has the patient been a risk to self within the distant past? No.  Is the patient a risk to others? No.  Has the patient been a risk to others in the past 6 months? No.  Has the patient been a risk to others within the distant past? No.   Prior Inpatient Therapy:   Prior Outpatient Therapy:    Alcohol Screening: 1. How often do you have a drink containing alcohol?: 2 to 3 times a week 2. How many drinks containing alcohol do you have on a typical day when you are drinking?: 1 or 2 3. How often do you have six or more drinks on one occasion?: Weekly Preliminary Score: 3 4. How often during the last year have you found that you were not able to stop drinking once you had started?: Less than monthly 5. How often during the last year have you failed to do what was normally expected from you becasue of drinking?: Never 6. How often during the last year have you needed a first drink in the morning to get yourself going after a heavy drinking session?: Never 7. How often during the last year have you had a feeling of guilt of remorse after drinking?: Monthly 8. How often during the last year have you been unable to remember what happened the night before because you had been  drinking?: Less than monthly 9. Have you or someone else been injured as a result of your drinking?: No 10. Has a relative or friend or a doctor or another health worker been concerned about your drinking or suggested you cut down?: No Alcohol Use Disorder Identification Test Final Score (AUDIT): 10 Brief Intervention: Patient declined brief intervention Substance Abuse History in the last 12 months:  Yes.   Consequences of Substance  Abuse: Negative NA Previous Psychotropic Medications: Zyprexa Haldol Haldol Decanoate Psychological Evaluations: None reported Past Medical History:  Past Medical History  Diagnosis Date  . Gout   . Hypertension   . Schizophrenia ([place])    History reviewed. No pertinent past surgical history. Family History: History reviewed. No pertinent family history. Family Psychiatric  History: No family history of mental illness Tobacco Screening: '@FLOW'$ ([phone])::1)@ Social History:  History  Alcohol Use No     History  Drug Use No    Additional Social History:      Pain Medications: 9                    Allergies:   Allergies  Allergen Reactions  . Bactrim [Sulfamethoxazole-Trimethoprim] Nausea And Vomiting  . Lisinopril Swelling   Lab Results:  Results for orders placed or performed during the hospital encounter of 01/19/16 (from the past 48 hour(s))  CBC with Differential     Status: Abnormal   Collection Time: 01/19/16 11:46 AM  Result Value Ref Range   WBC 13.9 (H) 3.8 - 10.6 K/uL   RBC 5.30 4.40 - 5.90 MIL/uL   Hemoglobin 16.0 13.0 - 18.0 g/dL   HCT 47.3 40.0 - 52.0 %   MCV 89.3 80.0 - 100.0 fL   MCH 30.2 26.0 - 34.0 pg   MCHC 33.8 32.0 - 36.0 g/dL   RDW 14.7 (H) 11.5 - 14.5 %   Platelets 292 150 - 440 K/uL   Neutrophils Relative % 73 %   Neutro Abs 10.2 (H) 1.4 - 6.5 K/uL   Lymphocytes Relative 22 %   Lymphs Abs 3.1 1.0 - 3.6 K/uL   Monocytes Relative 4 %   Monocytes Absolute 0.5 0.2 - 1.0 K/uL   Eosinophils Relative 0 %   Eosinophils Absolute 0.1 0 - 0.7 K/uL   Basophils Relative 1 %   Basophils Absolute 0.1 0 - 0.1 K/uL  Comprehensive metabolic panel     Status: Abnormal   Collection Time: 01/19/16 11:46 AM  Result Value Ref Range   Sodium 141 135 - 145 mmol/L   Potassium 3.4 (L) 3.5 - 5.1 mmol/L   Chloride 105 101 - 111 mmol/L   CO2 28 22 - 32 mmol/L   Glucose, Bld 103 (H) 65 - 99 mg/dL   BUN 8 6 - 20 mg/dL   Creatinine, Ser 1.10 0.61 - 1.24  mg/dL   Calcium 9.5 8.9 - 10.3 mg/dL   Total Protein 9.2 (H) 6.5 - 8.1 g/dL   Albumin 4.4 3.5 - 5.0 g/dL   AST 32 15 - 41 U/L   ALT 39 17 - 63 U/L   Alkaline Phosphatase 84 38 - 126 U/L   Total Bilirubin 1.4 (H) 0.3 - 1.2 mg/dL   GFR calc non Af Amer >60 >60 mL/min   GFR calc Af Amer >60 >60 mL/min    Comment: (NOTE) The eGFR has been calculated using the CKD EPI equation. This calculation has not been validated in all clinical situations. eGFR's persistently <60 mL/min signify possible Chronic Kidney  Disease.    Anion gap 8 5 - 15  Ethanol     Status: None   Collection Time: 01/19/16 11:46 AM  Result Value Ref Range   Alcohol, Ethyl (B) <5 <5 mg/dL    Comment:        LOWEST DETECTABLE LIMIT FOR SERUM ALCOHOL IS 5 mg/dL FOR MEDICAL PURPOSES ONLY   Acetaminophen level     Status: Abnormal   Collection Time: 01/19/16 11:46 AM  Result Value Ref Range   Acetaminophen (Tylenol), Serum <10 (L) 10 - 30 ug/mL    Comment:        THERAPEUTIC CONCENTRATIONS VARY SIGNIFICANTLY. A RANGE OF 10-30 ug/mL MAY BE AN EFFECTIVE CONCENTRATION FOR MANY PATIENTS. HOWEVER, SOME ARE BEST TREATED AT CONCENTRATIONS OUTSIDE THIS RANGE. ACETAMINOPHEN CONCENTRATIONS >150 ug/mL AT 4 HOURS AFTER INGESTION AND >50 ug/mL AT 12 HOURS AFTER INGESTION ARE OFTEN ASSOCIATED WITH TOXIC REACTIONS.   Salicylate level     Status: None   Collection Time: 01/19/16 11:46 AM  Result Value Ref Range   Salicylate Lvl <2.6 2.8 - 30.0 mg/dL  Wound culture     Status: None (Preliminary result)   Collection Time: 01/19/16 12:49 PM  Result Value Ref Range   Specimen Description BUTTOCKS    Special Requests NONE    Gram Stain PENDING    Culture HOLDING FOR POSSIBLE PATHOGEN    Report Status PENDING   MRSA PCR Screening     Status: None   Collection Time: 01/19/16  1:53 PM  Result Value Ref Range   MRSA by PCR NEGATIVE NEGATIVE    Comment:        The GeneXpert MRSA Assay (FDA approved for NASAL  specimens only), is one component of a comprehensive MRSA colonization surveillance program. It is not intended to diagnose MRSA infection nor to guide or monitor treatment for MRSA infections.   Urine Drug Screen, Qualitative ([facility] only)     Status: Abnormal   Collection Time: 01/19/16  2:06 PM  Result Value Ref Range   Tricyclic, Ur Screen NONE DETECTED NONE DETECTED   Amphetamines, Ur Screen NONE DETECTED NONE DETECTED   MDMA (Ecstasy)Ur Screen NONE DETECTED NONE DETECTED   Cocaine Metabolite,Ur [place] NONE DETECTED NONE DETECTED   Opiate, Ur Screen NONE DETECTED NONE DETECTED   Phencyclidine (PCP) Ur S NONE DETECTED NONE DETECTED   Cannabinoid 50 Ng, Ur [place] POSITIVE (A) NONE DETECTED   Barbiturates, Ur Screen NONE DETECTED NONE DETECTED   Benzodiazepine, Ur Scrn NONE DETECTED NONE DETECTED   Methadone Scn, Ur NONE DETECTED NONE DETECTED    Comment: (NOTE) 948  Tricyclics, urine               Cutoff 1000 ng/mL 200  Amphetamines, urine             Cutoff 1000 ng/mL 300  MDMA (Ecstasy), urine           Cutoff 500 ng/mL 400  Cocaine Metabolite, urine       Cutoff 300 ng/mL 500  Opiate, urine                   Cutoff 300 ng/mL 600  Phencyclidine (PCP), urine      Cutoff 25 ng/mL 700  Cannabinoid, urine              Cutoff 50 ng/mL 800  Barbiturates, urine             Cutoff 200 ng/mL 900  Benzodiazepine, urine  Cutoff 200 ng/mL 1000 Methadone, urine                Cutoff 300 ng/mL 1100 1200 The urine drug screen provides only a preliminary, unconfirmed 1300 analytical test result and should not be used for non-medical 1400 purposes. Clinical consideration and professional judgment should 1500 be applied to any positive drug screen result due to possible 1600 interfering substances. A more specific alternate chemical method 1700 must be used in order to obtain a confirmed analytical result.  1800 Gas chromato graphy / mass spectrometry (GC/MS) is the preferred 1900  confirmatory method.   Urinalysis complete, with microscopic ([facility] only)     Status: Abnormal   Collection Time: 01/19/16  2:06 PM  Result Value Ref Range   Color, Urine AMBER (A) YELLOW   APPearance CLEAR (A) CLEAR   Glucose, UA NEGATIVE NEGATIVE mg/dL   Bilirubin Urine NEGATIVE NEGATIVE   Ketones, ur 1+ (A) NEGATIVE mg/dL   Specific Gravity, Urine 1.023 1.005 - 1.030   Hgb urine dipstick NEGATIVE NEGATIVE   pH 6.0 5.0 - 8.0   Protein, ur 30 (A) NEGATIVE mg/dL   Nitrite NEGATIVE NEGATIVE   Leukocytes, UA TRACE (A) NEGATIVE   RBC / HPF 0-5 0 - 5 RBC/hpf   WBC, UA 6-30 0 - 5 WBC/hpf   Bacteria, UA NONE SEEN NONE SEEN   Squamous Epithelial / LPF 0-5 (A) NONE SEEN   Mucous PRESENT    Hyaline Casts, UA PRESENT     Blood Alcohol level:  Lab Results  Component Value Date   ETH <5 39/76/7341    Metabolic Disorder Labs:  No results found for: HGBA1C, MPG No results found for: PROLACTIN No results found for: CHOL, TRIG, HDL, CHOLHDL, VLDL, LDLCALC  Current Medications: Current Facility-Administered Medications  Medication Dose Route Frequency Provider Last Rate Last Dose  . acetaminophen (TYLENOL) tablet 650 mg  650 mg Oral Q6H PRN Rainey Pines, MD      . alum & mag hydroxide-simeth (MAALOX/MYLANTA) 200-200-20 MG/5ML suspension 30 mL  30 mL Oral Q4H PRN Rainey Pines, MD      . benztropine (COGENTIN) tablet 1 mg  1 mg Oral BID Rainey Pines, MD      . colchicine tablet 0.6 mg  0.6 mg Oral See admin instructions Rainey Pines, MD   0.6 mg at 01/20/16 0616  . haloperidol (HALDOL) tablet 5 mg  5 mg Oral BID Rainey Pines, MD      . indomethacin (INDOCIN) capsule 50 mg  50 mg Oral TID PRN Rainey Pines, MD      . magnesium hydroxide (MILK OF MAGNESIA) suspension 30 mL  30 mL Oral Daily PRN Rainey Pines, MD       PTA Medications: Prescriptions prior to admission  Medication Sig Dispense Refill Last Dose  . amLODipine-benazepril (LOTREL) 10-40 MG capsule Take 1 capsule by mouth daily.    01/16/2016  . colchicine 0.6 MG tablet Take 0.6 mg by mouth See admin instructions. Take 2 tablets (1.'2mg'$ ) by mouth for first dose, then 1 tablet by mouth 1 hour later, then 1 tablet by mouth daily until gout pain is gone.   Past Month at Unknown time  . HYDROcodone-acetaminophen (NORCO) 5-325 MG per tablet Take 1 tablet by mouth every 8 (eight) hours as needed for moderate pain. 10 tablet 0   . ibuprofen (ADVIL,MOTRIN) 800 MG tablet Take 1 tablet (800 mg total) by mouth every 8 (eight) hours as needed for moderate pain. 15 tablet  0   . indomethacin (INDOCIN) 50 MG capsule Take 50 mg by mouth 3 (three) times daily as needed for mild pain or moderate pain.   Past Month at Unknown time  . oxyCODONE-acetaminophen (ROXICET) 5-325 MG per tablet Take 1 tablet by mouth every 4 (four) hours as needed for severe pain. 15 tablet 0     Musculoskeletal: Strength & Muscle Tone: within normal limits Gait & Station: normal Patient leans: N/A  Psychiatric Specialty Exam: Physical Exam  ROS  Blood pressure 148/97, pulse 70, temperature 98.2 F (36.8 C), temperature source Oral, resp. rate 18, height '5\' 7"'$  (1.702 m), weight 226 lb (102.513 kg), SpO2 100 %.Body mass index is 35.39 kg/(m^2).  General Appearance: Bizarre and Disheveled  Eye Contact::  Fair  Speech:  Slow  Volume:  Decreased  Mood:  Depressed and Dysphoric  Affect:  Constricted and Depressed  Thought Process:  Disorganized  Orientation:  Full (Time, Place, and Person)  Thought Content:  Delusions  Suicidal Thoughts:  No  Homicidal Thoughts:  No  Memory:  Immediate;   Fair  Judgement:  Fair  Insight:  Fair  Psychomotor Activity:  Decreased  Concentration:  Fair  Recall:  [employer] of Knowledge:Fair  Language: Fair  Akathisia:  No  Handed:  Right  AIMS (if indicated):     Assets:  Communication Skills Desire for Improvement Physical Health  ADL's:  Intact  Cognition: WNL  Sleep:  Number of Hours: 1     Treatment Plan  Summary: Daily contact with patient to assess and evaluate symptoms and progress in treatment and Medication management  Observation Level/Precautions:  15 minute checks  Laboratory:  HbAIC  Psychotherapy:    Medications:    Consultations:    Discharge Concerns:    Estimated LOS:  Other:     I certify that inpatient services furnished can reasonably be expected to improve the patient's condition.    I will start patient Haldol 5 mg by mouth twice a day.  He reported that he has recently received Haldol Decanoate at [facility].  We will obtain collateral information and his records from [facility] tomorrow morning when the office will open. Continue to monitor. Treatment team to follow   Rainey Pines, MD 3/5/201710:04 AM

## 2016-01-20 NOTE — [facility] Group Notes (Signed)
[facility] LCSW Group Therapy  01/20/2016 3:01 PM  Type of Therapy:  Group Therapy  Participation Level:  Did Not Attend  Modes of Intervention:  Confrontation, Discussion, Socialization and Support  Summary of Progress/Problems: Self esteem: Patients discussed self esteem and how it impacts them. They discussed what aspects in their lives has influenced their self esteem. They were challenged to identify changes that are needed in order to improve self esteem.    Walter Pena L [name] MSW, LCSWA  01/20/2016, 3:01 PM   

## 2016-01-21 ENCOUNTER — Encounter: Payer: Self-pay | Admitting: Psychiatry

## 2016-01-21 DIAGNOSIS — I10 Essential (primary) hypertension: Secondary | ICD-10-CM | POA: Diagnosis present

## 2016-01-21 DIAGNOSIS — M109 Gout, unspecified: Secondary | ICD-10-CM | POA: Diagnosis present

## 2016-01-21 DIAGNOSIS — F1220 Cannabis dependence, uncomplicated: Secondary | ICD-10-CM | POA: Diagnosis present

## 2016-01-21 DIAGNOSIS — F17200 Nicotine dependence, unspecified, uncomplicated: Secondary | ICD-10-CM | POA: Diagnosis present

## 2016-01-21 DIAGNOSIS — L732 Hidradenitis suppurativa: Secondary | ICD-10-CM | POA: Diagnosis present

## 2016-01-21 LAB — TSH: TSH: 0.653 u[IU]/mL (ref 0.350–4.500)

## 2016-01-21 MED ORDER — BENAZEPRIL HCL 20 MG PO TABS
40.0000 mg | ORAL_TABLET | Freq: Every day | ORAL | Status: DC
  Administered 2016-01-21 – 2016-01-24 (×4): 40 mg via ORAL
  Filled 2016-01-21: qty 1
  Filled 2016-01-21 (×4): qty 2

## 2016-01-21 MED ORDER — HALOPERIDOL DECANOATE 100 MG/ML IM SOLN: 100.0000 mg | INTRAMUSCULAR | Status: DC

## 2016-01-21 MED ORDER — AMLODIPINE BESYLATE 5 MG PO TABS
5.0000 mg | ORAL_TABLET | Freq: Every day | ORAL | Status: DC
  Administered 2016-01-21 – 2016-01-24 (×4): 5 mg via ORAL
  Filled 2016-01-21 (×5): qty 1

## 2016-01-21 MED ORDER — TRAZODONE HCL 100 MG PO TABS
100.0000 mg | ORAL_TABLET | Freq: Every day | ORAL | Status: DC
  Administered 2016-01-21 – 2016-01-22 (×2): 100 mg via ORAL
  Filled 2016-01-21 (×2): qty 1

## 2016-01-21 MED ORDER — NICOTINE 21 MG/24HR TD PT24
21.0000 mg | MEDICATED_PATCH | Freq: Every day | TRANSDERMAL | Status: DC
  Filled 2016-01-21 (×2): qty 1

## 2016-01-21 MED ORDER — CLINDAMYCIN HCL 150 MG PO CAPS
300.0000 mg | ORAL_CAPSULE | Freq: Three times a day (TID) | ORAL | Status: DC
  Administered 2016-01-21 – 2016-01-24 (×10): 300 mg via ORAL
  Filled 2016-01-21: qty 1
  Filled 2016-01-21: qty 2
  Filled 2016-01-21 (×2): qty 1
  Filled 2016-01-21: qty 2
  Filled 2016-01-21 (×7): qty 1

## 2016-01-21 MED ORDER — INFLUENZA VAC SPLIT QUAD 0.5 ML IM SUSY
0.5000 mL | PREFILLED_SYRINGE | INTRAMUSCULAR | Status: DC
  Filled 2016-01-21: qty 0.5

## 2016-01-21 MED ORDER — HALOPERIDOL DECANOATE 100 MG/ML IM SOLN
50.0000 mg | Freq: Once | INTRAMUSCULAR | Status: DC
  Filled 2016-01-21: qty 0.5

## 2016-01-21 NOTE — [facility] Group Notes (Signed)
[facility] Group Notes:  (Nursing/MHT/Case Management/Adjunct)  Date:  01/21/2016  Time:  12:23 PM  Type of Therapy:  Group Therapy  Participation Level:  Did Not Attend  Participation Quality Summary of Progress/Problems:  Walter Pena 01/21/2016, 12:23 PM

## 2016-01-21 NOTE — Progress Notes (Signed)
Recreation Therapy Notes  INPATIENT RECREATION THERAPY ASSESSMENT  Patient Details Name: Walter Pena. MRN: [id number] DOB: [birth year] Today's Date: 01/21/2016  Patient Stressors: Family, Death (Not a good relationship with family - doesn't understand them; dad died last year)  Coping Skills:   Isolate, Substance Abuse, Exercise, Talking, Music, Sports, Other (Comment) (counting)  Personal Challenges: Anger, Communication, Concentration, Substance Abuse, Trusting Others  Leisure Interests (2+):  Sports - [employer], Music - Listen  Awareness of Community Resources:  Yes  Community Resources:  [employer], [place]  Current Use: No  If no, Barriers?: Other (Comment) (Thinks about other things)  Patient Strengths:  "I love everything about myself"  Patient Identified Areas of Improvement:  "I don't know"  Current Recreation Participation:  Partying - having friends over; having girld over  Patient Goal for Hospitalization:  To get stable on medicine  [place] of Residence:  [place] of Residence:  [place]   Current SI (including self-harm):  No  Current HI:  No  Consent to Intern Participation: N/A  Patient refused one-to-one treatment sessions. Due to patient's refusal, LRT will not develop a Recreational Therapy Care Plan. If patient's status changes, LRT will develop a Recreational Therapy Care Plan.  Jacquelynn Cree, LRT/CTRS 01/21/2016, 2:07 PM

## 2016-01-21 NOTE — [facility] Group Notes (Signed)
[facility] Group Notes:  (Nursing/MHT/Case Management/Adjunct)  Date:  01/21/2016  Time:  1:50 AM  Type of Therapy:  Group Therapy  Participation Level:  Did Not Attend  Summary of Progress/Problems:  Veva Holes 01/21/2016, 1:50 AM

## 2016-01-21 NOTE — Progress Notes (Signed)
D:  Per pt self inventory pt reports sleeping fair, appetite fair, energy level normal, ability to pay attention good, rates depression at a 0 out of 10, hopelessness at a 0 out of 10, anxiety at a 0 out of 10, denies SI/HI/AVH, goal today: "Go home so I can get back to what I was doing", pleasant and cooperative during admission.     A:  Emotional support provided, Encouraged pt to continue with treatment plan and attend all group activities, q15 min checks maintained for safety.  R:  Pt is receptive, going to groups, pleasant and cooperative with staff and other patients on the unit.

## 2016-01-21 NOTE — [facility] Group Notes (Signed)
[facility] LCSW Aftercare Discharge Planning Group Note   01/21/2016 9:15 AM  ?  Participation Quality: Alert, Appropriate and Oriented   Mood/Affect: Depressed and Flat   Depression Rating: 0  Anxiety Rating: 1  Thoughts of Suicide: Pt denies SI/HI   Will you contract for safety? Yes   Current AVH: Pt denies   Plan for Discharge/Comments: Pt attended discharge planning group and actively participated in group. CSW provided pt with today's workbook. Pt shared extensively with the group his plan to spend more time taking care of himself for his family member's sake upon discharge.  Pt was actively involved in group sharing and was actively listening to the sharing of others in the group, as well  Transportation Means: Pt reports access to transportation   Supports: Pt lists supports in the community as family     Dorothe Pea. Colleena Kurtenbach, MSW, LCSWA, LCAS

## 2016-01-21 NOTE — Progress Notes (Signed)
D: Observed pt in room lying down. Patient alert and oriented x4. Patient denies SI/HI/AVH. Pt affect is anxious. Pt stated "I've been sleeping all day." Pt had no insight into current hospitalization and when asked only responded "my mom dropper med off." Pt denies any current mental health concerns. Pt denies depression and anxiety. Pt has no complaints. Pt is malodorous.  A: Offered active listening and support. Provided therapeutic communication. Administered scheduled medications. Encouraged pt to attend groups and actively participate in care. Offered pt hygiene items.  R: Pt pleasant and cooperative. Pt medication compliant. Pt showered. Will continue Q15 min. checks. Safety maintained.

## 2016-01-21 NOTE — Progress Notes (Signed)
[facility] MD Progress Note  01/21/2016 12:15 PM Walter Pena.  MRN:  [id number]  Subjective: Walter Pena is confused, irritable and argumentative today. He is unable to explain to me how he ended up in the hospital but claims that he brought himself in. He'll longer thinks he needs to be in the hospital. He wants to discontinue Haldol Decanoate) take oral Haldol. He did go to [facility] to get Haldol injection on Friday. He was late for his injection. According to his mother he has not been taking any of his medicines for several weeks. This resulted in poor hygiene and exacerbation of his hidradenitis. He also has UTI. The patient claims to be treatment compliant. He worries about his bills.   Principal Problem: Schizophrenia, paranoid, chronic (HCC) Diagnosis:   Patient Active Problem List   Diagnosis Date Noted  . HTN (hypertension) [I10] 01/21/2016  . Cannabis use disorder, moderate, dependence (HCC) [F12.20] 01/21/2016  . Tobacco use disorder [F17.200] 01/21/2016  . Gout [M10.9] 01/21/2016  . UTI (urinary tract infection) [N39.0] 01/21/2016  . Hidradenitis [L73.2] 01/21/2016  . Schizophrenia, paranoid, chronic (HCC) [F20.0] 01/20/2016   Total Time spent with patient: 30 minutes  Past Psychiatric History: Schizophrenia.  Past Medical History:  Past Medical History  Diagnosis Date  . Gout   . Hypertension   . Schizophrenia (HCC)    History reviewed. No pertinent past surgical history. Family History: History reviewed. No pertinent family history. Family Psychiatric  History: See H&P. Social History:  History  Alcohol Use No     History  Drug Use No    Social History   Social History  . Marital Status: Single    Spouse Name: N/A  . Number of Children: N/A  . Years of Education: N/A   Social History Main Topics  . Smoking status: Current Every Day Smoker -- 1.00 packs/day    Types: Cigarettes  . Smokeless tobacco: Never Used  . Alcohol Use: No  . Drug Use: No  . Sexual  Activity: Not Asked   Other Topics Concern  . None   Social History Narrative   Additional Social History:    Pain Medications: 9                    Sleep: Fair  Appetite:  Fair  Current Medications: Current Facility-Administered Medications  Medication Dose Route Frequency Provider Last Rate Last Dose  . acetaminophen (TYLENOL) tablet 650 mg  650 mg Oral Q6H PRN Brandy Hale, MD      . alum & mag hydroxide-simeth (MAALOX/MYLANTA) 200-200-20 MG/5ML suspension 30 mL  30 mL Oral Q4H PRN Brandy Hale, MD   30 mL at 01/20/16 1711  . amLODipine (NORVASC) tablet 5 mg  5 mg Oral Daily Korin Setzler B Adlene Adduci, MD      . benazepril (LOTENSIN) tablet 40 mg  40 mg Oral Daily Amerie Beaumont B Tamanika Heiney, MD      . benztropine (COGENTIN) tablet 1 mg  1 mg Oral BID Brandy Hale, MD   1 mg at 01/21/16 0855  . clindamycin (CLEOCIN) capsule 300 mg  300 mg Oral 3 times per day Shari Prows, MD   300 mg at 01/21/16 0640  . colchicine tablet 0.6 mg  0.6 mg Oral See admin instructions Brandy Hale, MD   0.6 mg at 01/20/16 0616  . haloperidol (HALDOL) tablet 5 mg  5 mg Oral BID Brandy Hale, MD   5 mg at 01/21/16 0855  . [START ON 02/15/2016]  haloperidol decanoate (HALDOL DECANOATE) 100 MG/ML injection 100 mg  100 mg Intramuscular Q30 days Charmin Aguiniga B Meshell Abdulaziz, MD      . haloperidol decanoate (HALDOL DECANOATE) 100 MG/ML injection 50 mg  50 mg Intramuscular Once Joleene Burnham B Jep Dyas, MD      . indomethacin (INDOCIN) capsule 50 mg  50 mg Oral TID PRN Brandy Hale, MD      . Melene Muller ON 01/22/2016] Influenza vac split quadrivalent PF (FLUARIX) injection 0.5 mL  0.5 mL Intramuscular Tomorrow-1000 Maryori Weide B Challis Crill, MD      . magnesium hydroxide (MILK OF MAGNESIA) suspension 30 mL  30 mL Oral Daily PRN Brandy Hale, MD        Lab Results:  Results for orders placed or performed during the hospital encounter of 01/19/16 (from the past 48 hour(s))  Wound culture     Status: None (Preliminary result)    Collection Time: 01/19/16 12:49 PM  Result Value Ref Range   Specimen Description BUTTOCKS    Special Requests NONE    Gram Stain      MANY GRAM POSITIVE COCCI IN PAIRS RARE GRAM POSITIVE RODS MODERATE GRAM NEGATIVE RODS RARE RED BLOOD CELLS    Culture NORMAL SKIN FLORA NO ANAEROBIC CULTURE ORDERED     Report Status PENDING   MRSA PCR Screening     Status: None   Collection Time: 01/19/16  1:53 PM  Result Value Ref Range   MRSA by PCR NEGATIVE NEGATIVE    Comment:        The GeneXpert MRSA Assay (FDA approved for NASAL specimens only), is one component of a comprehensive MRSA colonization surveillance program. It is not intended to diagnose MRSA infection nor to guide or monitor treatment for MRSA infections.   Urine Drug Screen, Qualitative ([facility] only)     Status: Abnormal   Collection Time: 01/19/16  2:06 PM  Result Value Ref Range   Tricyclic, Ur Screen NONE DETECTED NONE DETECTED   Amphetamines, Ur Screen NONE DETECTED NONE DETECTED   MDMA (Ecstasy)Ur Screen NONE DETECTED NONE DETECTED   Cocaine Metabolite,Ur [place] NONE DETECTED NONE DETECTED   Opiate, Ur Screen NONE DETECTED NONE DETECTED   Phencyclidine (PCP) Ur S NONE DETECTED NONE DETECTED   Cannabinoid 50 Ng, Ur [place] POSITIVE (A) NONE DETECTED   Barbiturates, Ur Screen NONE DETECTED NONE DETECTED   Benzodiazepine, Ur Scrn NONE DETECTED NONE DETECTED   Methadone Scn, Ur NONE DETECTED NONE DETECTED    Comment: (NOTE) 100  Tricyclics, urine               Cutoff 1000 ng/mL 200  Amphetamines, urine             Cutoff 1000 ng/mL 300  MDMA (Ecstasy), urine           Cutoff 500 ng/mL 400  Cocaine Metabolite, urine       Cutoff 300 ng/mL 500  Opiate, urine                   Cutoff 300 ng/mL 600  Phencyclidine (PCP), urine      Cutoff 25 ng/mL 700  Cannabinoid, urine              Cutoff 50 ng/mL 800  Barbiturates, urine             Cutoff 200 ng/mL 900  Benzodiazepine, urine           Cutoff 200 ng/mL 1000 Methadone,  urine  Cutoff 300 ng/mL 1100 1200 The urine drug screen provides only a preliminary, unconfirmed 1300 analytical test result and should not be used for non-medical 1400 purposes. Clinical consideration and professional judgment should 1500 be applied to any positive drug screen result due to possible 1600 interfering substances. A more specific alternate chemical method 1700 must be used in order to obtain a confirmed analytical result.  1800 Gas chromato graphy / mass spectrometry (GC/MS) is the preferred 1900 confirmatory method.   Urinalysis complete, with microscopic ([facility] only)     Status: Abnormal   Collection Time: 01/19/16  2:06 PM  Result Value Ref Range   Color, Urine AMBER (A) YELLOW   APPearance CLEAR (A) CLEAR   Glucose, UA NEGATIVE NEGATIVE mg/dL   Bilirubin Urine NEGATIVE NEGATIVE   Ketones, ur 1+ (A) NEGATIVE mg/dL   Specific Gravity, Urine 1.023 1.005 - 1.030   Hgb urine dipstick NEGATIVE NEGATIVE   pH 6.0 5.0 - 8.0   Protein, ur 30 (A) NEGATIVE mg/dL   Nitrite NEGATIVE NEGATIVE   Leukocytes, UA TRACE (A) NEGATIVE   RBC / HPF 0-5 0 - 5 RBC/hpf   WBC, UA 6-30 0 - 5 WBC/hpf   Bacteria, UA NONE SEEN NONE SEEN   Squamous Epithelial / LPF 0-5 (A) NONE SEEN   Mucous PRESENT    Hyaline Casts, UA PRESENT     Blood Alcohol level:  Lab Results  Component Value Date   ETH <5 01/19/2016    Physical Findings: AIMS:  , ,  ,  , Dental Status Current problems with teeth and/or dentures?: No Does patient usually wear dentures?: No  CIWA:    COWS:     Musculoskeletal: Strength & Muscle Tone: within normal limits Gait & Station: normal Patient leans: N/A  Psychiatric Specialty Exam: Review of Systems  Skin: Positive for rash.  All other systems reviewed and are negative.   Blood pressure 139/86, pulse 68, temperature 98 F (36.7 C), temperature source Oral, resp. rate 18, height 5\' 7"  (1.702 m), weight 102.513 kg (226 lb), SpO2 100 %.Body mass  index is 35.39 kg/(m^2).  General Appearance: Disheveled  Eye Solicitor::  Fair  Speech:  Clear and Coherent  Volume:  Normal  Mood:  Angry, Dysphoric and Irritable  Affect:  Congruent  Thought Process:  Disorganized  Orientation:  Full (Time, Place, and Person)  Thought Content:  Delusions and Paranoid Ideation  Suicidal Thoughts:  No  Homicidal Thoughts:  No  Memory:  Immediate;   Fair Recent;   Fair Remote;   Fair  Judgement:  Impaired  Insight:  Lacking  Psychomotor Activity:  Normal  Concentration:  Fair  Recall:  [employer] of Knowledge:Poor  Language: Fair  Akathisia:  No  Handed:  Right  AIMS (if indicated):     Assets:  Communication Skills Desire for Improvement Financial Resources/Insurance Housing Physical Health Resilience Social Support  ADL's:  Intact  Cognition: WNL  Sleep:  Number of Hours: 4.5   Treatment Plan Summary: Daily contact with patient to assess and evaluate symptoms and progress in treatment and Medication management   Walter Pena is a 35 year old male with a history of schizophrenia admitted for psychotic break in the context of treatment compliance.  1. Psychosis. He has been maintained on a combination of Haldol decanoate 50 mg every 4 weeks and oral Haldol. He was late for his injection and has not been taking Haldol by mouth. He received 50 mg of Haldol Decanoate March 3. We  started oral Haldol together with Cogentin for psychosis. We will offer another 50 mg of Haldol Decanoate today. He did better in the past on a higher dose.  2. Insomnia. We will offer trazodone.  3. Skin infection. We started clindamycin as recommended by ER doctor.  4. UTI. Urine culture pending.  5. Gout. He is on colchicine.    6. Hypertension. He is on amlodipine and benzepril.  7.  Smoking. Nicotine patches available.  8. Disposition. He will be discharged home. He will follow up with Dr. Georjean Mode at [facility].  Kristine Linea, MD 01/21/2016, 12:15 PM

## 2016-01-21 NOTE — Plan of Care (Signed)
Problem: Alteration in thought process Goal: LTG-Patient verbalizes understanding importance med regimen (Patient verbalizes understanding of importance of medication regimen and need to continue outpatient care.)  Outcome: Progressing Pt compliant with medications.     
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
## 2016-01-22 LAB — WOUND CULTURE: Culture: NORMAL

## 2016-01-22 LAB — PROLACTIN: Prolactin: 6.9 ng/mL (ref 4.0–15.2)

## 2016-01-22 MED ORDER — HALOPERIDOL 5 MG PO TABS
10.0000 mg | ORAL_TABLET | Freq: Every day | ORAL | Status: DC
  Administered 2016-01-22 – 2016-01-23 (×2): 10 mg via ORAL
  Filled 2016-01-22 (×2): qty 2

## 2016-01-22 NOTE — Tx Team (Signed)
Interdisciplinary Treatment Plan Update (Adult)  Date:  01/22/2016 Time Reviewed:  3:07 PM  Progress in Treatment: Attending groups: No. Participating in groups:  No. Taking medication as prescribed:  No. Tolerating medication:  Yes. Family/Significant othe contact made:  Yes, Mother  Patient understands diagnosis:  Yes. Discussing patient identified problems/goals with staff:  Yes. Medical problems stabilized or resolved:  Yes. Denies suicidal/homicidal ideation: Yes. Issues/concerns per patient self-inventory:  No. Other:  New problem(s) identified: No, Describe:     Discharge Plan or Barriers:  Pt will be discharged home to his own house and will follow up with Dr. Jamse Arn at [facility].  Reason for Continuation of Hospitalization: Medication stabilization Other; describe paranoia, disorganization  Comments:Mr. Main is still rather disorganized and paranoid. He refused medications this morning. His hygiene is very poor and he refuses to shower. He denies any somatic problems in particular any symptoms of UTI. He does not participate in programming and stays mostly in his room but frequently approaches notices and his physician with multiple questions and requests.  Estimated length of stay: Up to 3 days  New goal(s):  Review of initial/current patient goals per problem list:   1.  Goal(s):Pt will participate in aftercare plan.  Met:  In process  Target date:at discharge  As evidenced ZL:DJTT for housing and psychiatric follow up being identified  2.  Goal (s): Pt will exhibit decreased depressive symptoms and suicidal ideation.  Met:  In process  Target date:at discharge  As evidenced by:Pt will utilize self-rating of depression at 3 or below and demonstrate decreased signs of depression OR be deemed stable by MD.  3.  Goal(s):Pt will demonstrate decrease in psychotic symptoms  Met:  in process  Target date:at discharge  As evidenced [id number] in signs and symptoms of  psychosis OR Pt is deemed stable or at baseline by MD.  Attendees: Patient:  Walter Pena 3/7/20173:07 PM  Family:   3/7/20173:07 PM  Physician:  Bary Leriche 3/7/20173:07 PM  Nursing:   Polly Cobia RN 3/7/20173:07 PM  Case Manager:   3/7/20173:07 PM  Counselor:  Dossie Arbour, LCSW 3/7/20173:07 PM  Other:  Everitt Amber LRT 3/7/20173:07 PM  Other:   3/7/20173:07 PM  Other:   3/7/20173:07 PM  Other:  3/7/20173:07 PM  Other:  3/7/20173:07 PM  Other:  3/7/20173:07 PM  Other:  3/7/20173:07 PM  Other:  3/7/20173:07 PM  Other:  3/7/20173:07 PM  Other:   3/7/20173:07 PM   Scribe for Treatment Team:   August Saucer, 01/22/2016, 3:07 PM, MSW, LCSW

## 2016-01-22 NOTE — [facility] Group Notes (Signed)
[facility] LCSW Aftercare Discharge Planning Group Note  01/21/2016 9:15 AM  Participation Quality: Did Not Attend. Patient invited to participate but declined.   [name] F. Cardale Dorer, MSW, LCSWA, LCAS   

## 2016-01-22 NOTE — Progress Notes (Signed)
[facility] MD Progress Note  01/22/2016 10:23 AM Walter Pena.  MRN:  [id number]  Subjective:  Walter Pena is still rather disorganized and paranoid. He refused medications this morning. His hygiene is very poor and he refuses to shower. He denies any somatic problems in particular any symptoms of UTI. He does not participate in programming and stays mostly in his room but frequently approaches notices and his physician with multiple questions and requests.  Principal Problem: Schizophrenia, paranoid, chronic (HCC) Diagnosis:   Patient Active Problem List   Diagnosis Date Noted  . HTN (hypertension) [I10] 01/21/2016  . Cannabis use disorder, moderate, dependence (HCC) [F12.20] 01/21/2016  . Tobacco use disorder [F17.200] 01/21/2016  . Gout [M10.9] 01/21/2016  . UTI (urinary tract infection) [N39.0] 01/21/2016  . Hidradenitis [L73.2] 01/21/2016  . 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  Past Medical History:  Past Medical History  Diagnosis Date  . Gout   . Hypertension   . Schizophrenia (HCC)    History reviewed. No pertinent past surgical history. Family History: History reviewed. No pertinent family history. Family Psychiatric  History: See H&P. Social History:  History  Alcohol Use No     History  Drug Use No    Social History   Social History  . Marital Status: Single    Spouse Name: N/A  . Number of Children: N/A  . Years of Education: N/A   Social History Main Topics  . Smoking status: Current Every Day Smoker -- 1.00 packs/day    Types: Cigarettes  . Smokeless tobacco: Never Used  . Alcohol Use: No  . Drug Use: No  . Sexual Activity: Not Asked   Other Topics Concern  . None   Social History Narrative   Additional Social History:    Pain Medications: 9                    Sleep: Poor  Appetite:  Fair  Current Medications: Current  Facility-Administered Medications  Medication Dose Route Frequency Provider Last Rate Last Dose  . acetaminophen (TYLENOL) tablet 650 mg  650 mg Oral Q6H PRN Brandy Hale, MD      . alum & mag hydroxide-simeth (MAALOX/MYLANTA) 200-200-20 MG/5ML suspension 30 mL  30 mL Oral Q4H PRN Brandy Hale, MD   30 mL at 01/20/16 1711  . amLODipine (NORVASC) tablet 5 mg  5 mg Oral Daily Asra Gambrel B Logyn Kendrick, MD   5 mg at 01/21/16 1325  . benazepril (LOTENSIN) tablet 40 mg  40 mg Oral Daily Manu Rubey B Spenser Cong, MD   40 mg at 01/21/16 1341  . benztropine (COGENTIN) tablet 1 mg  1 mg Oral BID Brandy Hale, MD   1 mg at 01/21/16 2133  . clindamycin (CLEOCIN) capsule 300 mg  300 mg Oral 3 times per day Shari Prows, MD   300 mg at 01/22/16 0644  . colchicine tablet 0.6 mg  0.6 mg Oral See admin instructions Brandy Hale, MD   0.6 mg at 01/20/16 0616  . haloperidol (HALDOL) tablet 5 mg  5 mg Oral BID Brandy Hale, MD   5 mg at 01/21/16 2133  . [START ON 02/15/2016] haloperidol decanoate (HALDOL DECANOATE) 100 MG/ML injection 100 mg  100 mg Intramuscular Q30 days Akina Maish B Gwenivere Hiraldo, MD      . haloperidol decanoate (HALDOL DECANOATE) 100 MG/ML injection 50 mg  50 mg Intramuscular Once Shari Prows, MD  50 mg at 01/21/16 1331  . indomethacin (INDOCIN) capsule 50 mg  50 mg Oral TID PRN Brandy Hale, MD      . Influenza vac split quadrivalent PF (FLUARIX) injection 0.5 mL  0.5 mL Intramuscular Tomorrow-1000 Quenna Doepke B Saloni Lablanc, MD   0.5 mL at 01/22/16 0954  . magnesium hydroxide (MILK OF MAGNESIA) suspension 30 mL  30 mL Oral Daily PRN Brandy Hale, MD      . nicotine (NICODERM CQ - dosed in mg/24 hours) patch 21 mg  21 mg Transdermal Daily Keshav Winegar B Kelcee Bjorn, MD   21 mg at 01/21/16 1341  . traZODone (DESYREL) tablet 100 mg  100 mg Oral QHS Shari Prows, MD   100 mg at 01/21/16 2133    Lab Results: No results found for this or any previous visit (from the past 48 hour(s)).  Blood Alcohol level:   Lab Results  Component Value Date   ETH <5 01/19/2016    Physical Findings: AIMS:  , ,  ,  , Dental Status Current problems with teeth and/or dentures?: No Does patient usually wear dentures?: No  CIWA:    COWS:     Musculoskeletal: Strength & Muscle Tone: within normal limits Gait & Station: normal Patient leans: N/A  Psychiatric Specialty Exam: Review of Systems  Skin: Positive for rash.  All other systems reviewed and are negative.   Blood pressure 142/93, pulse 56, temperature 98 F (36.7 C), temperature source Oral, resp. rate 20, height 5\' 7"  (1.702 m), weight 102.513 kg (226 lb), SpO2 100 %.Body mass index is 35.39 kg/(m^2).  General Appearance: Disheveled  Eye Contact::  Minimal  Speech:  Garbled  Volume:  Decreased  Mood:  Anxious  Affect:  Congruent  Thought Process:  Disorganized  Orientation:  Full (Time, Place, and Person)  Thought Content:  Delusions and Paranoid Ideation  Suicidal Thoughts:  No  Homicidal Thoughts:  No  Memory:  Immediate;   Fair Recent;   Fair Remote;   Fair  Judgement:  Poor  Insight:  Lacking  Psychomotor Activity:  Decreased  Concentration:  Fair  Recall:  [employer] of Knowledge:Fair  Language: Fair  Akathisia:  No  Handed:  Right  AIMS (if indicated):     Assets:  Communication Skills Desire for Improvement Financial Resources/Insurance Housing Physical Health Resilience Social Support  ADL's:  Intact  Cognition: WNL  Sleep:  Number of Hours: 6.3   Treatment Plan Summary: Daily contact with patient to assess and evaluate symptoms and progress in treatment and Medication management   Walter Pena is a 35 year old male with a history of schizophrenia admitted for psychotic break in the context of treatment compliance.  1. Psychosis. He has been maintained on a combination of Haldol decanoate 50 mg every 4 weeks and oral Haldol. He was late for his injection and has not been taking Haldol by mouth. He received 50 mg of  Haldol Decanoate March 3. We started oral Haldol together with Cogentin for psychosis. We will continue to offer another 50 mg of Haldol Decanoate to improve compliance. He did better in the past on a higher dose.  2. Insomnia. We offered trazodone.  3. Skin infection. We started clindamycin as recommended by ER doctor.  4. UTI. Urine culture pending.  5. Gout. He is on colchicine.   6. Hypertension. He is on amlodipine and benzepril.  7. Smoking. Nicotine patches available.  8. Metabolic syndrome screening. TSH is normal, Lipid profile and HgbA1C are pending. PRL  is low which likely confirms poor compliance with Haldol.   9. Disposition. He will be discharged home. He will follow up with Dr. Georjean Mode at [facility].  Kristine Linea, MD 01/22/2016, 10:23 AM

## 2016-01-22 NOTE — [facility] Group Notes (Signed)
[facility] Group Notes:  (Nursing/MHT/Case Management/Adjunct)  Date:  01/22/2016  Time:  2:16 PM  Type of Therapy:  Psychoeducational Skills  Participation Level:  Did Not Attend   Walter Pena [facility] 01/22/2016, 2:16 PM

## 2016-01-22 NOTE — Progress Notes (Signed)
Pleasant on approach, A&Ox3, denied SI/HI, denied AV/H, "I came to the Ed here about the "Boil" in my buttocks, they told me I was waiting for a room, then, they brought me here.Marland KitchenMarland KitchenI don't belong here."

## 2016-01-22 NOTE — [facility] Group Notes (Signed)
[facility] LCSW Group Therapy   01/22/2016 11:00 am  Type of Therapy: Group Therapy   Participation Level: Did Not Attend. Patient invited to participate but declined.    Alyra Patty F. Flint Hakeem, MSW, LCSWA, LCAS   

## 2016-01-22 NOTE — Plan of Care (Signed)
Problem: Alteration in thought process Goal: LTG-Patient has not harmed self or others in at least 2 days Outcome: Progressing No harm in 2 days

## 2016-01-22 NOTE — Progress Notes (Signed)
Recreation Therapy Notes  Date: 03.07.17 Time: 3:00 pm Location: Craft Room  Group Topic: Self-expression  Goal Area(s) Addresses:  Patient will identify one color per emotion listed on wheel. Patient will verbalize benefit of using art as a means of self-expression. Patient will verbalize one emotion experienced during session. Patient will be educated on other forms of self-expression.  Behavioral Response: Attentive, Left early  Intervention: Emotion Wheel  Activity: Patients were given an Arboriculturist with 7 different emotions and were instructed to pick a color for each emotion.  Education: LRT educated group on different forms of self-expression.  Education Outcome: Patient left before LRT educated group.   Clinical Observations/Feedback: Patient completed activity by picking colors for each emotion. Patient did not contribute to group discussion. Patient left group at approximately 3:43 pm. Patient did not return to group.  Jacquelynn Cree, LRT/CTRS 01/22/2016 4:23 PM

## 2016-01-22 NOTE — Plan of Care (Signed)
Problem: Ineffective individual coping Goal: STG: Patient will remain free from self harm Outcome: Progressing Medications administered as ordered, including antibiotics, by the physician, medications Therapeutic Effects, SEs and Adverse effects discussed, questions encouraged; no PRN given, 15 minute checks maintained for safety, room closer to the nurses' station for frequent and random check; clinical and moral support provided, patient encouraged to continue to express feelings and demonstrate safe care. Patient remain free from harm, will continue to monitor.

## 2016-01-22 NOTE — Progress Notes (Signed)
D: Patient refused his morning medications , later took medication  in afternoon  irritated and agitated . Stated he was ready to go home . Pacing halls . Voice of his mother bring him clothing items . Refused to shower until items are brought. Patient odorous . Limited insight and interaction with peers . Confusion with unit programing.  A: Encourage patient participation with unit programming . Instruction  Given on  Medication , verbalize understanding. R: Voice no other concerns. Staff continue to monitor

## 2016-01-22 NOTE — Progress Notes (Signed)
Poor body hygrine, stench body odor, pt encouraged to shower

## 2016-01-23 LAB — LIPID PANEL
CHOL/HDL RATIO: 4.6 ratio
CHOLESTEROL: 132 mg/dL (ref 0–200)
HDL: 29 mg/dL — AB (ref >40)
LDL Cholesterol: 82 mg/dL (ref 0–99)
Triglycerides: 105 mg/dL (ref <150)
VLDL: 21 mg/dL (ref 0–40)

## 2016-01-23 LAB — URINE CULTURE
Culture: NO GROWTH
SPECIAL REQUESTS: NORMAL

## 2016-01-23 LAB — HEMOGLOBIN A1C: Hgb A1c MFr Bld: 5.7 % (ref 4.0–6.0)

## 2016-01-23 MED ORDER — TEMAZEPAM 15 MG PO CAPS
15.0000 mg | ORAL_CAPSULE | Freq: Every day | ORAL | Status: DC
  Administered 2016-01-23: 15 mg via ORAL
  Filled 2016-01-23: qty 1

## 2016-01-23 MED ORDER — COLCHICINE 0.6 MG PO TABS
0.6000 mg | ORAL_TABLET | Freq: Every day | ORAL | Status: DC
  Administered 2016-01-23 – 2016-01-24 (×2): 0.6 mg via ORAL
  Filled 2016-01-23 (×2): qty 1

## 2016-01-23 NOTE — Progress Notes (Signed)
D: Patient has been pleasant during shift. He has been mildly confused and delusional thinking we've been calling him and that he has belongings in the nurses desk. He denies SI/HI/AVH. He appears very anxious. He denies any pain.  A: Medication given with education. Encouragement provided.  R: Patient was compliant with medication. He has remained calm and cooperative. Safety maintained with 15 min checks.

## 2016-01-23 NOTE — Progress Notes (Signed)
Recreation Therapy Notes  Date: 03.08.17 Time: 3:00 pm Location: Craft Room  Group Topic: Self-esteem  Goal Area(s) Addresses:  Patient will identify positive attributes about self. Patient will identify at least one coping skill.  Behavioral Response: Did not attend  Intervention: All About Me  Activity: Patients were instructed to make an All About Me pamphlet including their life goal, positive traits, healthy coping skills, and their support system.  Education: LRT educated patients on ways they can increase their self-esteem.  Education Outcome: Patient did not attend group.  Clinical Observations/Feedback: Patient did not attend group.  Jacquelynn Cree, LRT/CTRS 01/23/2016 4:05 PM

## 2016-01-23 NOTE — Plan of Care (Signed)
Problem: Alteration in thought process Goal: STG-Patient is able to follow short directions Outcome: Progressing Limited insight  Into behavior , redirection as needed

## 2016-01-23 NOTE — [facility] Group Notes (Signed)
[facility] LCSW Aftercare Discharge Planning Group Note  01/23/2016 9:15 AM  Participation Quality: Did Not Attend. Patient invited to participate but declined.   Nichola Cieslinski F. Betha Shadix, MSW, LCSWA, LCAS   

## 2016-01-23 NOTE — Plan of Care (Signed)
Problem: Alteration in thought process Goal: STG-Patient is able to sleep at least 6 hours per night Outcome: Progressing Patient got 6.5 hours of slept previous night.

## 2016-01-23 NOTE — [facility] Group Notes (Signed)
[facility] LCSW Group Therapy   01/23/2016 1:15 PM   Type of Therapy: Group Therapy   Participation Level: Active   Participation Quality: Attentive, Sharing and Supportive   Affect: Depressed and Flat   Cognitive: Alert and Oriented   Insight: Developing/Improving and Engaged   Engagement in Therapy: Developing/Improving and Engaged   Modes of Intervention: Clarification, Confrontation, Discussion, Education, Exploration, Limit-setting, Orientation, Problem-solving, Rapport Building, Dance movement psychotherapist, Socialization and Support   Summary of Progress/Problems: The topic for group today was emotional regulation. This group focused on both positive and negative emotion identification and allowed group members to process ways to identify feelings, regulate negative emotions, and find healthy ways to manage internal/external emotions. Group members were asked to reflect on a time when their reaction to an emotion led to a negative outcome and explored how alternative responses using emotion regulation would have benefited them. Group members were also asked to discuss a time when emotion regulation was utilized when a negative emotion was experienced. Pt reported that in the past stress experienced by the pt due to conflict within his family relationships has caused the pt much difficulty.  Pt shared that he walks and exercises in order to feel better.  Pt shared that today he "decides not to worry" and that when he uses this technique he is successful in regulating his emotions.Pt was polite and cooperative with the CSW and other group members and focused and attentive to the topics discussed and the sharing of others.     Dorothe Pea. Canyon Lohr, MSW, LCSWA, LCAS

## 2016-01-23 NOTE — Progress Notes (Signed)
D:Patient continues to voice of wanting to go home . Stated he had made a arrangement with his landlord to buy a home he was living in. Received d clothing from home  Showered this shift.  Affect approachable , no unit programing  Appetite good . Patient pacing all shift up and down halls.  Angry with MD about his staying here) Denies suicidal  homicidal ideations  .  No auditory hallucinations  No pain concerns . Appropriate ADL'S. Interacting with peers and staff.  A: Encourage patient participation with unit programming . Instruction  Given on  Medication , verbalize understanding. R: Voice no other concerns. Staff continue to monitor

## 2016-01-23 NOTE — [facility] Group Notes (Signed)
[facility] Group Notes:  (Nursing/MHT/Case Management/Adjunct)  Date:  01/23/2016  Time:  7:00 PM  Type of Therapy:  Psychoeducational Skills  Participation Level:  Active  Participation Quality:  Appropriate  Affect:  Appropriate  Cognitive:  Appropriate  Insight:  Appropriate  Engagement in Group:  Engaged  Modes of Intervention:  Discussion, Education and Support  Summary of Progress/Problems:  Walter Pena 01/23/2016, 7:00 PM

## 2016-01-23 NOTE — Progress Notes (Signed)
[facility] MD Progress Note  01/23/2016 12:18 PM Walter Pena.  MRN:  [id number]  Subjective:  Walter Pena is still disorganized, restless, intrusive, insomniac. He slept 3 hours only. He denies any symptoms of depression, anxiety, or psychosis. He does not participate in programming. She denied somewhat confused about his situation. He still takes medications with much encouragement. He does not want to take an additional 50 mg of Haldol Decanoate complaining of muscle pain. He informs me that he wants to discontinue injections altogether. There is a history of treatment noncompliance. Today he is preoccupied with his general health as his father passed away in October 20, 2023 of a stroke. He was told by his primary doctor that he needs to lose weight and is very interested in his weight today. 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 He tells me that he used to hang out with some music status and this was interrupted by changes in his appearance that he believes were brought about by a spider bite he sustained in jail. He wants to sue. Indeed he has some skin changes on his face.  Principal Problem: Schizophrenia, paranoid, chronic (HCC) Diagnosis:   Patient Active Problem List   Diagnosis Date Noted  . HTN (hypertension) [I10] 01/21/2016  . Cannabis use disorder, moderate, dependence (HCC) [F12.20] 01/21/2016  . Tobacco use disorder [F17.200] 01/21/2016  . Gout [M10.9] 01/21/2016  . UTI (urinary tract infection) [N39.0] 01/21/2016  . Hidradenitis [L73.2] 01/21/2016  . 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  Past Medical History:  Past Medical History  Diagnosis Date  . Gout   . Hypertension   . Schizophrenia (HCC)    History reviewed. No pertinent past surgical history. Family History: History reviewed. No pertinent family history. Family Psychiatric  History: None reported. Social  History:  History  Alcohol Use No     History  Drug Use No    Social History   Social History  . Marital Status: Single    Spouse Name: N/A  . Number of Children: N/A  . Years of Education: N/A   Social History Main Topics  . Smoking status: Current Every Day Smoker -- 1.00 packs/day    Types: Cigarettes  . Smokeless tobacco: Never Used  . Alcohol Use: No  . Drug Use: No  . Sexual Activity: Not Asked   Other Topics Concern  . None   Social History Narrative   Additional Social History:  Specify valuables returned: none Pain Medications: 9                    Sleep: Poor  Appetite:  Good  Current Medications: Current Facility-Administered Medications  Medication Dose Route Frequency Provider Last Rate Last Dose  . acetaminophen (TYLENOL) tablet 650 mg  650 mg Oral Q6H PRN Brennan Litzinger B Izayah Miner, MD      . alum & mag hydroxide-simeth (MAALOX/MYLANTA) 200-200-20 MG/5ML suspension 30 mL  30 mL Oral Q4H PRN Brandy Hale, MD   30 mL at 01/20/16 1711  . amLODipine (NORVASC) tablet 5 mg  5 mg Oral Daily Shari Prows, MD   5 mg at 01/23/16 0833  . benazepril (LOTENSIN) tablet 40 mg  40 mg Oral Daily Shari Prows, MD   40 mg at 01/23/16 0833  . benztropine (COGENTIN) tablet 1 mg  1 mg Oral BID Brandy Hale, MD   1 mg at 01/23/16 1478  . clindamycin (  CLEOCIN) capsule 300 mg  300 mg Oral 3 times per day Shari Prows, MD   300 mg at 01/23/16 2671  . colchicine tablet 0.6 mg  0.6 mg Oral Daily Jahad Old B Sybel Standish, MD   0.6 mg at 01/23/16 1217  . haloperidol (HALDOL) tablet 10 mg  10 mg Oral QHS Shari Prows, MD   10 mg at 01/22/16 2120  . [START ON 02/15/2016] haloperidol decanoate (HALDOL DECANOATE) 100 MG/ML injection 100 mg  100 mg Intramuscular Q30 days Nyasiah Moffet B Ladaja Yusupov, MD      . haloperidol decanoate (HALDOL DECANOATE) 100 MG/ML injection 50 mg  50 mg Intramuscular Once Math Brazie B Saira Kramme, MD   50 mg at 01/21/16 1331  . indomethacin  (INDOCIN) capsule 50 mg  50 mg Oral TID PRN Brandy Hale, MD      . Influenza vac split quadrivalent PF (FLUARIX) injection 0.5 mL  0.5 mL Intramuscular Tomorrow-1000 Kitara Hebb B Vipul Cafarelli, MD   0.5 mL at 01/22/16 0954  . magnesium hydroxide (MILK OF MAGNESIA) suspension 30 mL  30 mL Oral Daily PRN Brandy Hale, MD      . nicotine (NICODERM CQ - dosed in mg/24 hours) patch 21 mg  21 mg Transdermal Daily Mcclain Shall B Arriyanna Mersch, MD   21 mg at 01/21/16 1341  . traZODone (DESYREL) tablet 100 mg  100 mg Oral QHS Shari Prows, MD   100 mg at 01/22/16 2120    Lab Results:  Results for orders placed or performed during the hospital encounter of 01/19/16 (from the past 48 hour(s))  Lipid panel     Status: Abnormal   Collection Time: 01/23/16  8:06 AM  Result Value Ref Range   Cholesterol 132 0 - 200 mg/dL   Triglycerides 245 <809 mg/dL   HDL 29 (L) >98 mg/dL   Total CHOL/HDL Ratio 4.6 RATIO   VLDL 21 0 - 40 mg/dL   LDL Cholesterol 82 0 - 99 mg/dL    Comment:        Total Cholesterol/HDL:CHD Risk Coronary Heart Disease Risk Table                     Men   Women  1/2 Average Risk   3.4   3.3  Average Risk       5.0   4.4  2 X Average Risk   9.6   7.1  3 X Average Risk  23.4   11.0        Use the calculated Patient Ratio above and the CHD Risk Table to determine the patient's CHD Risk.        ATP III CLASSIFICATION (LDL):  <100     mg/dL   Optimal  338-250  mg/dL   Near or Above                    Optimal  130-159  mg/dL   Borderline  539-767  mg/dL   High  >341     mg/dL   Very High     Blood Alcohol level:  Lab Results  Component Value Date   ETH <5 01/19/2016    Physical Findings: AIMS:  , ,  ,  , Dental Status Current problems with teeth and/or dentures?: No Does patient usually wear dentures?: No  CIWA:    COWS:     Musculoskeletal: Strength & Muscle Tone: within normal limits Gait & Station: normal Patient leans: N/A  Psychiatric Specialty Exam: Review of  Systems  Psychiatric/Behavioral: The patient has insomnia.   All other systems reviewed and are negative.   Blood pressure 127/111, pulse 85, temperature 98 F (36.7 C), temperature source Oral, resp. rate 20, height  (1.702 m), weight 102.513 kg (226 lb), SpO2 100 %.Body mass index is 35.39 kg/(m^2).  General Appearance: Casual  Eye Contact::  Fair  Speech:  Slurred  Volume:  Decreased  Mood:  Dysphoric  Affect:  Blunt  Thought Process:  Goal Directed  Orientation:  Full (Time, Place, and Person)  Thought Content:  Delusions and Paranoid Ideation  Suicidal Thoughts:  No  Homicidal Thoughts:  No  Memory:  Immediate;   Fair Recent;   Fair Remote;   Fair  Judgement:  Impaired  Insight:  Lacking  Psychomotor Activity:  Increased  Concentration:  Poor  Recall:  Poor  Fund of Knowledge:Poor  Language: Poor  Akathisia:  No  Handed:  Right  AIMS (if indicated):     Assets:  Communication Skills Desire for Improvement Financial Resources/Insurance Housing Physical Health Resilience Social Support  ADL's:  Intact  Cognition: WNL  Sleep:  Number of Hours: 3.75   Treatment Plan Summary: Daily contact with patient to assess and evaluate symptoms and progress in treatment and Medication management   Walter Pena is a 35 year old male with a history of schizophrenia admitted for psychotic break in the context of treatment compliance.  1. Psychosis. He has been maintained on a combination of Haldol decanoate 50 mg every 4 weeks and oral Haldol. He was late for his injection and has not been taking Haldol by mouth. He received 50 mg of Haldol Decanoate March 3. We started oral Haldol together with Cogentin for psychosis. We will continue to offer another 50 mg of Haldol Decanoate to improve compliance. He did better in the past on a higher dose.  2. Insomnia. He did not sleep well with trazodone. Will give Restoril tonight.   3. Skin infection. We started clindamycin as  recommended by ER doctor.  4. UTI. Urine culture pending.  5. Gout. He is on colchicine.   6. Hypertension. He is on amlodipine and benzepril.  7. Smoking. Nicotine patches available.  8. Metabolic syndrome screening. TSH is normal, Lipid profile and HgbA1C are pending. PRL is low which likely confirms poor compliance with Haldol.   9. Alcohol abuse. At the patient disclosed that he is been drinking more since his father passed away in 10-20-23. He reports 2-3 bottles of liquor a week but makes a comment that he shares with others. There were no symptoms of alcohol withdrawal. Vital signs were stable.   10. Disposition. He will be discharged home. He will follow up with Dr. Georjean Mode at [facility].  Kristine Linea, MD 01/23/2016, 12:18 PM

## 2016-01-24 MED ORDER — COLCHICINE 0.6 MG PO TABS: 0.6000 mg | ORAL_TABLET | Freq: Every day | ORAL | Status: DC

## 2016-01-24 MED ORDER — HALOPERIDOL 10 MG PO TABS: 10.0000 mg | ORAL_TABLET | Freq: Every day | ORAL | Status: DC

## 2016-01-24 MED ORDER — TEMAZEPAM 15 MG PO CAPS: 15.0000 mg | ORAL_CAPSULE | Freq: Every day | ORAL | Status: DC

## 2016-01-24 MED ORDER — AMLODIPINE BESY-BENAZEPRIL HCL 10-40 MG PO CAPS: 1.0000 | ORAL_CAPSULE | Freq: Every day | ORAL | Status: DC

## 2016-01-24 MED ORDER — HALOPERIDOL DECANOATE 100 MG/ML IM SOLN
50.0000 mg | Freq: Once | INTRAMUSCULAR | Status: AC
  Administered 2016-01-24: 50 mg via INTRAMUSCULAR
  Filled 2016-01-24: qty 0.5

## 2016-01-24 MED ORDER — HALOPERIDOL DECANOATE 100 MG/ML IM SOLN: 100.0000 mg | INTRAMUSCULAR | Status: DC

## 2016-01-24 MED ORDER — CLINDAMYCIN HCL 150 MG PO CAPS
150.0000 mg | ORAL_CAPSULE | Freq: Three times a day (TID) | ORAL | Status: DC
  Administered 2016-01-24: 150 mg via ORAL
  Filled 2016-01-24: qty 1

## 2016-01-24 MED ORDER — INDOMETHACIN 50 MG PO CAPS: 50.0000 mg | ORAL_CAPSULE | Freq: Three times a day (TID) | ORAL | Status: DC | PRN

## 2016-01-24 MED ORDER — CLINDAMYCIN HCL 150 MG PO CAPS: 150.0000 mg | ORAL_CAPSULE | Freq: Three times a day (TID) | ORAL | Status: DC

## 2016-01-24 NOTE — [facility] Group Notes (Signed)
[facility] Group Notes:  (Nursing/MHT/Case Management/Adjunct)  Date:  01/24/2016  Time:  12:45 AM  Type of Therapy:  Group Therapy  Participation Level:  Active  Participation Quality:  Appropriate  Affect:  Appropriate  Cognitive:  Appropriate  Insight:  Appropriate  Engagement in Group:  Engaged  Modes of Intervention:  n/a  Summary of Progress/Problems:  Walter Pena 01/24/2016, 12:45 AM

## 2016-01-24 NOTE — Discharge Summary (Signed)
Physician Discharge Summary Note  Walter Pena:  Walter Polcyn. is an 35 y.o., male MRN:  [id number] DOB:  15-Jun-1981 Walter Pena phone:  [phone] (home)  Walter Pena address:   [address] [place] [address],  Total Time spent with Walter Pena: 30 minutes  Date of Admission:  01/19/2016 Date of Discharge: 01/24/2016  Reason for Admission:  Psychotic break.  Identifying data. Walter Pena is a 35 year old male with a history of schizophrenia and substance use.  Chief complaint. "I don't need to be here."  History of Present Illness: The history was obtained from the Walter Pena as well as review of his chart. Walter Pena is a 35 year old African-American male who presented to the ER and was placed on involuntary commitment. Walter Pena has long history of schizophrenia and has been following at the [facility]. During my interview Walter Pena appeared tired and was disorganized and disheveled. Walter Pena smelled strongly. Walter Pena reported that Walter Pena did not have anywhere else to go. Walter Pena stated that Walter Pena was brought to the emergency room by his aunt. Walter Pena reported that Walter Pena follows up at [facility] and received an injection 2 days ago. Walter Pena reported that Walter Pena also takes Haldol pills. Walter Pena does not know the strength. Walter Pena reported that Walter Pena feels that somebody is following him. However Walter Pena did not elaborate. Walter Pena currently denied having any suicidal ideations or plans. Walter Pena denied having any thoughts to hurt anybody else. Walter Pena appeared disorganized and bizarre behavior during the interview. Walter Pena did not participate much in the interview.  Associated Signs/Symptoms: Depression Symptoms: depressed mood, fatigue, anxiety, (Hypo) Manic Symptoms: Impulsivity, Irritable Mood, Anxiety Symptoms: denied Psychotic Symptoms: Paranoia, PTSD Symptoms: Negative  Past Psychiatric History:  Documented past history of schizophrenia with episodes of paranoia, bizarre behavior, and disorganization. In the past, Walter Pena had been maintained for some time on Zyprexa and also on Haldol to  which Walter Pena had a good response. Walter Pena has a history of substance abuse as well. Walter Pena denies any history of suicide attempts or violence to others. There seemed to have been extended problems with noncompliance.  Walter Pena reported that Walter Pena has history of multiple hospitalizations in the past.  Principal Problem: Schizophrenia, paranoid, chronic (HCC) Discharge Diagnoses: Walter Pena Active Problem List   Diagnosis Date Noted  . HTN (hypertension) [I10] 01/21/2016  . Cannabis use disorder, moderate, dependence (HCC) [F12.20] 01/21/2016  . Tobacco use disorder [F17.200] 01/21/2016  . Gout [M10.9] 01/21/2016  . UTI (urinary tract infection) [N39.0] 01/21/2016  . Hidradenitis [L73.2] 01/21/2016  . Schizophrenia, paranoid, chronic (HCC) [F20.0] 01/20/2016    Past Psychiatric History: Schizophrenia.  Past Medical History:  Past Medical History  Diagnosis Date  . Gout   . Hypertension   . Schizophrenia (HCC)    History reviewed. No pertinent past surgical history. Family History: History reviewed. No pertinent family history. Family Psychiatric  History: None reported. Social History:  History  Alcohol Use No     History  Drug Use No    Social History   Social History  . Marital Status: Single    Spouse Name: N/A  . Number of Children: N/A  . Years of Education: N/A   Social History Main Topics  . Smoking status: Current Every Day Smoker -- 1.00 packs/day    Types: Cigarettes  . Smokeless tobacco: Never Used  . Alcohol Use: No  . Drug Use: No  . Sexual Activity: Not Asked   Other Topics Concern  . None   Social History Narrative    Hospital Course:    Mr. Maga  is a 35 year old male with a history of schizophrenia admitted for psychotic break in the context of treatment compliance and drinking.  1. Psychosis. Walter Pena has been maintained on a combination of Haldol decanoate 50 mg every 4 weeks and oral Haldol. Walter Pena was late for his injection and has not been taking Haldol by mouth. Walter Pena  received 50 mg of Haldol Decanoate March 3. We started oral Haldol together with Cogentin for psychosis. We offered another 50 mg of Haldol Decanoate on 01/24/2016 to improve compliance. Walter Pena did better in the past on a higher dose.  2. Insomnia. Walter Pena did not sleep well with trazodone. We started Restoril.   3. Skin infection. We started clindamycin as recommended by ER doctor. Skin culture revealed normal skin bacterial flora.  4. UTI. Urine culture was negative.   5. Gout. Walter Pena is on colchicine and indometacine..   6. Hypertension. Walter Pena is on amlodipine and benzepril.  7. Smoking. Nicotine patches were available.  8. Metabolic syndrome screening. TSH, Lipid profile and HgbA1C were normal. PRL is low which likely confirms poor compliance with Haldol.   9. Alcohol abuse. At the Walter Pena disclosed that Walter Pena is been drinking more since his father passed away in Oct 05, 2023. Walter Pena reports 2-3 bottles of liquor a week but makes a comment that Walter Pena shares with others. There were no symptoms of alcohol withdrawal. Vital signs were stable.   10. Disposition. Walter Pena was discharged home. Walter Pena will follow up with Dr. Georjean Mode at [facility].  Physical Findings: AIMS:  , ,  ,  , Dental Status Current problems with teeth and/or dentures?: No Does Walter Pena usually wear dentures?: No  CIWA:    COWS:     Musculoskeletal: Strength & Muscle Tone: within normal limits Gait & Station: normal Walter Pena leans: N/A  Psychiatric Specialty Exam: Review of Systems  Skin: Positive for rash.  All other systems reviewed and are negative.   Blood pressure 138/92, pulse 61, temperature 98.4 F (36.9 C), temperature source Oral, resp. rate 20, height 5\' 7"  (1.702 m), weight 102.513 kg (226 lb), SpO2 100 %.Body mass index is 35.39 kg/(m^2).  See SRA.                                                  Sleep:  Number of Hours: 6.5   Have you used any form of tobacco in the last 30 days? (Cigarettes, Smokeless Tobacco, Cigars,  and/or Pipes): Yes  Has this Walter Pena used any form of tobacco in the last 30 days? (Cigarettes, Smokeless Tobacco, Cigars, and/or Pipes) Yes, Yes, A prescription for an FDA-approved tobacco cessation medication was offered at discharge and the Walter Pena refused  Blood Alcohol level:  Lab Results  Component Value Date   [facility] <5 01/19/2016    Metabolic Disorder Labs:  Lab Results  Component Value Date   HGBA1C 5.7 01/23/2016   Lab Results  Component Value Date   PROLACTIN 6.9 01/19/2016   Lab Results  Component Value Date   CHOL 132 01/23/2016   TRIG 105 01/23/2016   HDL 29* 01/23/2016   CHOLHDL 4.6 01/23/2016   VLDL 21 01/23/2016   LDLCALC 82 01/23/2016    See Psychiatric Specialty Exam and Suicide Risk Assessment completed by Attending Physician prior to discharge.  Discharge destination:  Home  Is Walter Pena on multiple antipsychotic therapies at discharge:  No  Has Walter Pena had three or more failed trials of antipsychotic monotherapy by history:  No  Recommended Plan for Multiple Antipsychotic Therapies: NA  Discharge Instructions    Diet - low sodium heart healthy    Complete by:  As directed      Increase activity slowly    Complete by:  As directed             Medication List    STOP taking these medications        HYDROcodone-acetaminophen 5-325 MG tablet  Commonly known as:  NORCO     ibuprofen 800 MG tablet  Commonly known as:  ADVIL,MOTRIN     oxyCODONE-acetaminophen 5-325 MG tablet  Commonly known as:  ROXICET      TAKE these medications      Indication   amLODipine-benazepril 10-40 MG capsule  Commonly known as:  LOTREL  Take 1 capsule by mouth daily.   Indication:  High Blood Pressure     clindamycin 150 MG capsule  Commonly known as:  CLEOCIN  Take 1 capsule (150 mg total) by mouth every 8 (eight) hours.   Indication:  Skin and Soft Tissue Infection     colchicine 0.6 MG tablet  Take 1 tablet (0.6 mg total) by mouth daily. Take 2  tablets (1.2mg ) by mouth for first dose, then 1 tablet by mouth 1 hour later, then 1 tablet by mouth daily until gout pain is gone.   Indication:  Gout     haloperidol 10 MG tablet  Commonly known as:  HALDOL  Take 1 tablet (10 mg total) by mouth at bedtime.   Indication:  Schizophrenia     haloperidol decanoate 100 MG/ML injection  Commonly known as:  HALDOL DECANOATE  Inject 1 mL (100 mg total) into the muscle every 30 (thirty) days.  Start taking on:  02/15/2016   Indication:  Schizophrenia     indomethacin 50 MG capsule  Commonly known as:  INDOCIN  Take 1 capsule (50 mg total) by mouth 3 (three) times daily as needed for mild pain or moderate pain.   Indication:  Acute Joint Inflammation in Gout     temazepam 15 MG capsule  Commonly known as:  RESTORIL  Take 1 capsule (15 mg total) by mouth at bedtime.   Indication:  Trouble Sleeping           Follow-up Information    Follow up with [facility] On 01/28/2016.   Why:  Your hospital follow up appointment will be walk in. Walk in hours are Monday through Friday between 8:00 am and 10:00 am.    Contact information:   [address] [place] [address] [phone]       Follow-up recommendations:  Activity:  As tolerated. Diet:  Low sodium heart healthy. Other:  Keep follow-up appointments.  Comments:    Signed: Kristine Linea, MD 01/24/2016, 11:48 AM

## 2016-01-24 NOTE — [facility] Suicide Risk Assessment (Signed)
[facility] Discharge Suicide Risk Assessment   Principal Problem: Schizophrenia, paranoid, chronic (HCC) Discharge Diagnoses:  Patient Active Problem List   Diagnosis Date Noted  . HTN (hypertension) [I10] 01/21/2016  . Cannabis use disorder, moderate, dependence (HCC) [F12.20] 01/21/2016  . Tobacco use disorder [F17.200] 01/21/2016  . Gout [M10.9] 01/21/2016  . UTI (urinary tract infection) [N39.0] 01/21/2016  . Hidradenitis [L73.2] 01/21/2016  . Schizophrenia, paranoid, chronic (HCC) [F20.0] 01/20/2016    Total Time spent with patient: 30 minutes  Musculoskeletal: Strength & Muscle Tone: within normal limits Gait & Station: normal Patient leans: N/A  Psychiatric Specialty Exam: Review of Systems  All other systems reviewed and are negative.   Blood pressure 138/92, pulse 61, temperature 98.4 F (36.9 C), temperature source Oral, resp. rate 20, height 5\' 7"  (1.702 m), weight 102.513 kg (226 lb), SpO2 100 %.Body mass index is 35.39 kg/(m^2).  General Appearance: Casual  Eye Contact::  Good  Speech:  Clear and Coherent409  Volume:  Normal  Mood:  Dysphoric  Affect:  Appropriate  Thought Process:  Goal Directed  Orientation:  Full (Time, Place, and Person)  Thought Content:  Delusions and Paranoid Ideation  Suicidal Thoughts:  No  Homicidal Thoughts:  No  Memory:  Immediate;   Fair Recent;   Fair Remote;   Fair  Judgement:  Poor  Insight:  Shallow  Psychomotor Activity:  Normal  Concentration:  Fair  Recall:  [employer] of Knowledge:Fair  Language: Fair  Akathisia:  No  Handed:  Right  AIMS (if indicated):     Assets:  Communication Skills Desire for Improvement Financial Resources/Insurance Housing Physical Health Resilience Social Support  Sleep:  Number of Hours: 6.5  Cognition: WNL  ADL's:  Intact   Mental Status Per Nursing Assessment::   On Admission:  NA  Demographic Factors:  Male and Living alone  Loss Factors: NA  Historical  Factors: Impulsivity  Risk Reduction Factors:   Sense of responsibility to family, Positive social support and Positive therapeutic relationship  Continued Clinical Symptoms:  Alcohol/Substance Abuse/Dependencies Schizophrenia:   Less than 62 years old Paranoid or undifferentiated type  Cognitive Features That Contribute To Risk:  None    Suicide Risk:  Minimal: No identifiable suicidal ideation.  Patients presenting with no risk factors but with morbid ruminations; may be classified as minimal risk based on the severity of the depressive symptoms  Follow-up Information    Follow up with [facility] On 01/28/2016.   Why:  Your hospital follow up appointment will be walk in. Walk in hours are Monday through Friday between 8:00 am and 10:00 am.    Contact information:   [address] [place] [address] [phone]       Plan Of Care/Follow-up recommendations:  Activity:  As tolerated. Diet:  Low sodium heart healthy. Other:  Keep follow-up appointments.  Kristine Linea, MD 01/24/2016, 11:45 AM

## 2016-01-24 NOTE — [facility] Group Notes (Signed)
[facility] LCSW Group Therapy   01/24/2016 1pm   Type of Therapy: Group Therapy   Participation Level: Active   Participation Quality: Attentive, Sharing and Supportive   Affect: Appropriate   Cognitive: Alert and Oriented   Insight: Developing/Improving and Engaged   Engagement in Therapy: Developing/Improving and Engaged   Modes of Intervention: Clarification, Confrontation, Discussion, Education, Exploration, Limit-setting, Orientation, Problem-solving, Rapport Building, Dance movement psychotherapist, Socialization and Support   Summary of Progress/Problems: The topic for group was balance in life. Today's group focused on defining balance in one's own words, identifying things that can knock one off balance, and exploring healthy ways to maintain balance in life. Group members were asked to provide an example of a time when they felt off balance, describe how they handled that situation, and process healthier ways to regain balance in the future. Group members were asked to share the most important tool for maintaining balance that they learned while at [facility] and how they plan to apply this method after discharge. Pt shared that the issues in life that he is dealing with are focusing on are his desires to return to school, pay his mounting bills and learning coping techniques that will allow him to be a more balance person.  Pt shared that he believes that balancing these issues are key to his mental health recovery upon discharge.  Pt was polite and cooperative with the CSW and other group members and focused and attentive to the topics discussed and the sharing of others.  Pt shared that in the past he reacted in a negative fashion to stress in his life and attributes this to not working in a profession that paid the pt adequately.  Pt did not share at length, but did state he wants to cope more effectively with his stressors.

## 2016-01-24 NOTE — [facility] Suicide Risk Assessment (Signed)
[facility] INPATIENT:  Family/Significant Other Suicide Prevention Education  Suicide Prevention Education:  Education Completed;Pearly Funaro (mother) has been identified by the patient as the family member/significant other with whom the patient will be residing, and identified as the person(s) who will aid the patient in the event of a mental health crisis (suicidal ideations/suicide attempt).  With written consent from the patient, the family member/significant other has been provided the following suicide prevention education, prior to the and/or following the discharge of the patient.  The suicide prevention education provided includes the following:  Suicide risk factors  Suicide prevention and interventions  National Suicide Hotline telephone number  [facility] assessment telephone number  [facility] Emergency Assistance 911  [facility] and/or Residential Mobile Crisis Unit telephone number  Request made of family/significant other to:  Remove weapons (e.g., guns, rifles, knives), all items previously/currently identified as safety concern.    Remove drugs/medications (over-the-counter, prescriptions, illicit drugs), all items previously/currently identified as a safety concern.  The family member/significant other verbalizes understanding of the suicide prevention education information provided.  The family member/significant other agrees to remove the items of safety concern listed above.  Fabrice Dyal L Lamonta Cypress MSW, LCSWA  01/24/2016, 11:26 AM

## 2016-01-24 NOTE — Plan of Care (Signed)
Problem: Alteration in thought process Goal: LTG-Patient verbalizes understanding importance med regimen (Patient verbalizes understanding of importance of medication regimen and need to continue outpatient care.)  Outcome: Not Progressing Pt took ordered medications, but made multiple accusations that the medication was too much and that he shouldn't be taking this many pills. Writer has doubts if pt will continue regimen post discharge.

## 2016-01-24 NOTE — Plan of Care (Signed)
Problem: Ineffective individual coping Goal: STG: Patient will remain free from self harm Outcome: Progressing Denies suicidal ideations.     

## 2016-01-24 NOTE — Progress Notes (Signed)
  [facility] Adult Case Management Discharge Plan :  Will you be returning to the same living situation after discharge:  Yes,  Home At discharge, do you have transportation home?: Yes,  family  Do you have the ability to pay for your medications: Yes,  insurance   Release of information consent forms completed and in the chart;  Patient's signature needed at discharge.  Patient to Follow up at: Follow-up Information    Follow up with [facility] On 01/28/2016.   Why:  Your hospital follow up appointment will be walk in. Walk in hours are Monday through Friday between 8:00 am and 10:00 am.    Contact information:   [address] [facility] [address] [phone]       Next level of care provider has access to [facility] Link:no  Safety Planning and Suicide Prevention discussed: Yes,  with patient and mother   Have you used any form of tobacco in the last 30 days? (Cigarettes, Smokeless Tobacco, Cigars, and/or Pipes): Yes  Has patient been referred to the Quitline?: Patient refused referral  Patient has been referred for addiction treatment: Pt. refused referral  Rondall Allegra MSW, LCSWA  01/24/2016, 11:28 AM

## 2016-01-24 NOTE — Progress Notes (Signed)
Recreation Therapy Notes  Date: 03.09.17 Time: 1:00 pm Location: Craft Room  Group Topic: Leisure Education/Coping Skills  Goal Area(s) Addresses:  Patient will identify things they are grateful for.  Patient will identify how being grateful can influence decision making.  Behavioral Response: Attentive, Left early  Intervention: Grateful Wheel  Activity: Patients were given an I Am Grateful For worksheet and encouraged to list 2-3 things they were grateful for under each category.  Education: LRT educated patient on leisure and why it is a good Associate Professor.  Education Outcome: Patient left before LRT educated group.  Clinical Observations/Feedback: Patient completed activity by writing at least one thing he was grateful for under each category. Patient left group at approximately 1:19 pm to see about getting his shot. Patient did not return to group.  Jacquelynn Cree, LRT/CTRS 01/24/2016 2:55 PM

## 2016-01-24 NOTE — Progress Notes (Signed)
D: Observed pt in room. Patient alert and oriented x4. Patient denies SI/HI/AVH. Pt affect irritable, flat, and preoccupied. Pt indicated his day was "going pretty good." Pt talked briefly about drinking too much and partying as the source that led to his current hospitalization. Pt was preoccupied with discharge and medications. Pt became irritable when writer was giving medications. Pt denies feeling depressed or anxious. Pt had no other complaints  A: Offered active listening and support. Provided therapeutic communication. Encouraged pt to follow medication regimen and educated pt on current medications. Administered scheduled medications. Encouraged Clinical research associate to continue following medication regimen post discharge. R: Pt  Cooperative. Pt was medication compliant after encouragement, but pt still believes that he's been given to much medication. Will continue Q15 min. checks. Safety maintained.

## 2016-01-24 NOTE — Progress Notes (Signed)
D:Patient aware of discharge this shift . Patient returning home . Patient received all belonging locked up . Patient denies  Suicidal  And homicidal ideations  .  A: Writer instructed on discharge criteria  .Patient received prescriptions   f . Aware  Of follow up appointment . R: Patient left unit with no questions  Or concerns  With mother and sister

## 2016-01-24 NOTE — Tx Team (Signed)
Interdisciplinary Treatment Plan Update (Adult)  Date:  01/24/2016 Time Reviewed:  11:29 AM  Progress in Treatment: Attending groups: No. Participating in groups:  No. Taking medication as prescribed:  Yes. Tolerating medication:  Yes. Family/Significant othe contact made:  Yes, individual(s) contacted:  mother  Patient understands diagnosis:  No. and As evidenced by:  limited insight  Discussing patient identified problems/goals with staff:  Yes. Medical problems stabilized or resolved:  Yes. Denies suicidal/homicidal ideation: Yes. Issues/concerns per patient self-inventory:  Yes. Other:  New problem(s) identified: No, Describe:  NA  Discharge Plan or Barriers:Pt plans to return home and follow up with outpatient.    Reason for Continuation of Hospitalization: Delusions  Hallucinations Medication stabilization  Comments: He denies any symptoms of depression, anxiety, or psychosis. He does not participate in programming. She denied somewhat confused about his situation. He still takes medications with much encouragement. He does not want to take an additional 50 mg of Haldol Decanoate complaining of muscle pain. He informs me that he wants to discontinue injections altogether. There is a history of treatment noncompliance. Today he is preoccupied with his general health as his father passed away in 09-29-23 of a stroke. He was told by his primary doctor that he needs to lose weight and is very interested in his weight today. 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 He tells me that he used to hang out with some music status and this was interrupted by changes in his appearance that he believes were brought about by a spider bite he sustained in jail. He wants to sue. Indeed he has some skin changes on his face.   Estimated length of stay: Pt will likely discharge today.   New goal(s): NA  Review of initial/current patient goals per problem list:   1.  Goal(s): Patient will  participate in aftercare plan * Met:  * Target date: at discharge * As evidenced by: Patient will participate within aftercare plan AEB aftercare provider and housing plan at discharge being identified.  2.  Goal (s): Patient will demonstrate decreased symptoms of psychosis. * Met: No  *  Target date: at discharge * As evidenced by: Patient will not endorse signs of psychosis or be deemed stable for discharge by MD.   Attendees: Patient:  Walter Pena. 3/9/201711:29 AM  Family:   3/9/201711:29 AM  Physician:   Dr. Bary Leriche  3/9/201711:29 AM  Nursing:   Meredith Mody, RN  3/9/201711:29 AM  Case Manager:   3/9/201711:29 AM  Counselor:   3/9/201711:29 AM  Other:  Wray Kearns, LCSWA 3/9/201711:29 AM  Other:  Everitt Amber, LRT  3/9/201711:29 AM  Other:   3/9/201711:29 AM  Other:  3/9/201711:29 AM  Other:  3/9/201711:29 AM  Other:  3/9/201711:29 AM  Other:  3/9/201711:29 AM  Other:  3/9/201711:29 AM  Other:  3/9/201711:29 AM  Other:   3/9/201711:29 AM   Scribe for Treatment Team:   Wray Kearns, MSW, LCSWA  01/24/2016, 11:29 AM

## 2016-03-14 ENCOUNTER — Encounter: Payer: Self-pay | Admitting: Emergency Medicine

## 2016-03-14 ENCOUNTER — Emergency Department
Admission: EM | Admit: 2016-03-14 | Discharge: 2016-03-14 | Disposition: A | Discharge status: Home / Self Care | Payer: Medicare Other | Attending: Emergency Medicine | Admitting: Emergency Medicine

## 2016-03-14 DIAGNOSIS — M25472 Effusion, left ankle: Secondary | ICD-10-CM | POA: Diagnosis present

## 2016-03-14 DIAGNOSIS — M10071 Idiopathic gout, right ankle and foot: Secondary | ICD-10-CM | POA: Insufficient documentation

## 2016-03-14 DIAGNOSIS — Z79899 Other long term (current) drug therapy: Secondary | ICD-10-CM | POA: Diagnosis not present

## 2016-03-14 DIAGNOSIS — T560X1A Toxic effect of lead and its compounds, accidental (unintentional), initial encounter: Secondary | ICD-10-CM

## 2016-03-14 DIAGNOSIS — F17210 Nicotine dependence, cigarettes, uncomplicated: Secondary | ICD-10-CM | POA: Diagnosis not present

## 2016-03-14 DIAGNOSIS — T560X4A Toxic effect of lead and its compounds, undetermined, initial encounter: Secondary | ICD-10-CM | POA: Diagnosis not present

## 2016-03-14 DIAGNOSIS — I10 Essential (primary) hypertension: Secondary | ICD-10-CM | POA: Diagnosis not present

## 2016-03-14 DIAGNOSIS — F200 Paranoid schizophrenia: Secondary | ICD-10-CM | POA: Diagnosis not present

## 2016-03-14 DIAGNOSIS — M10172 Lead-induced gout, left ankle and foot: Secondary | ICD-10-CM | POA: Insufficient documentation

## 2016-03-14 MED ORDER — KETOROLAC TROMETHAMINE 60 MG/2ML IM SOLN
60.0000 mg | Freq: Once | INTRAMUSCULAR | Status: AC
  Administered 2016-03-14: 60 mg via INTRAMUSCULAR
  Filled 2016-03-14: qty 2

## 2016-03-14 MED ORDER — OXYCODONE-ACETAMINOPHEN 5-325 MG PO TABS
2.0000 | ORAL_TABLET | Freq: Once | ORAL | Status: AC
  Administered 2016-03-14: 2 via ORAL
  Filled 2016-03-14: qty 2

## 2016-03-14 MED ORDER — OXYCODONE-ACETAMINOPHEN 5-325 MG PO TABS: 1.0000 | ORAL_TABLET | ORAL | Status: DC | PRN

## 2016-03-14 MED ORDER — INDOMETHACIN 50 MG PO CAPS
50.0000 mg | ORAL_CAPSULE | Freq: Two times a day (BID) | ORAL | Status: DC
Start: 2016-03-14 — End: 2016-03-27

## 2016-03-14 NOTE — ED Provider Notes (Signed)
[facility] Emergency Department Provider Note  ____________________________________________  Time seen: Approximately 3:15 PM  I have reviewed the triage vital signs and the nursing notes.   HISTORY  Chief Complaint Joint Swelling    HPI Walter Renne. is a 35 y.o. male who presents for evaluation of pain and swelling to both ankles. Patient states has a past medical history of gout. Current medications not helping patient reports that symptoms were sudden onset yesterday and progressively getting worse. Describes his pain is 10 over 10 and nonradiating.   Past Medical History  Diagnosis Date  . Gout   . Hypertension   . Schizophrenia [facility])     Patient Active Problem List   Diagnosis Date Noted  . HTN (hypertension) 01/21/2016  . Cannabis use disorder, moderate, dependence (HCC) 01/21/2016  . Tobacco use disorder 01/21/2016  . Gout 01/21/2016  . UTI (urinary tract infection) 01/21/2016  . Hidradenitis 01/21/2016  . Schizophrenia, paranoid, chronic (HCC) 01/20/2016    History reviewed. No pertinent past surgical history.  Current Outpatient Rx  Name  Route  Sig  Dispense  Refill  . amLODipine-benazepril (LOTREL) 10-40 MG capsule   Oral   Take 1 capsule by mouth daily.   30 capsule   0   . clindamycin (CLEOCIN) 150 MG capsule   Oral   Take 1 capsule (150 mg total) by mouth every 8 (eight) hours.   90 capsule   0   . colchicine 0.6 MG tablet   Oral   Take 1 tablet (0.6 mg total) by mouth daily. Take 2 tablets (1.2mg ) by mouth for first dose, then 1 tablet by mouth 1 hour later, then 1 tablet by mouth daily until gout pain is gone.   30 tablet   0   . haloperidol (HALDOL) 10 MG tablet   Oral   Take 1 tablet (10 mg total) by mouth at bedtime.   30 tablet   0   . haloperidol decanoate (HALDOL DECANOATE) 100 MG/ML injection   Intramuscular   Inject 1 mL (100 mg total) into the muscle every 30 (thirty) days.   1 mL   1   .  indomethacin (INDOCIN) 50 MG capsule   Oral   Take 1 capsule (50 mg total) by mouth 2 (two) times daily with a meal.   30 capsule   0   . oxyCODONE-acetaminophen (ROXICET) 5-325 MG tablet   Oral   Take 1-2 tablets by mouth every 4 (four) hours as needed for severe pain.   15 tablet   0   . temazepam (RESTORIL) 15 MG capsule   Oral   Take 1 capsule (15 mg total) by mouth at bedtime.   30 capsule   0     Allergies Bactrim and Lisinopril  History reviewed. No pertinent family history.  Social History Social History  Substance Use Topics  . Smoking status: Current Every Day Smoker -- 1.00 packs/day    Types: Cigarettes  . Smokeless tobacco: Never Used  . Alcohol Use: Yes    Review of Systems Constitutional: No fever/chills Cardiovascular: Denies chest pain. Respiratory: Denies shortness of breath. Gastrointestinal: No abdominal pain.  No nausea, no vomiting.  No diarrhea.  No constipation. Genitourinary: Negative for dysuria. Musculoskeletal: Positive for bilateral ankle edema and redness and tenderness Skin: Negative for rash. Neurological: Negative for headaches, focal weakness or numbness.  10-point ROS otherwise negative.  ____________________________________________   PHYSICAL EXAM:  VITAL SIGNS: ED Triage Vitals  Enc  Vitals Group     BP 03/14/16 1450 135/85 mmHg     Pulse Rate 03/14/16 1450 85     Resp 03/14/16 1450 18     Temp 03/14/16 1450 98.7 F (37.1 C)     Temp Source 03/14/16 1450 Oral     SpO2 03/14/16 1450 97 %     Weight 03/14/16 1450 237 lb (107.502 kg)     Height 03/14/16 1450  (1.727 m)     Head Cir --      Peak Flow --      Pain Score 03/14/16 1450 10     Pain Loc --      Pain Edu? --      Excl. in GC? --     Constitutional: Alert and oriented. Well appearing and in no acute distress. Cardiovascular: Normal rate, regular rhythm. Grossly normal heart sounds.  Good peripheral circulation. Respiratory: Normal respiratory  effort.  No retractions. Lungs CTAB. Musculoskeletal: Bilateral foot and ankle edema and redness and warmth. Neurologic:  Normal speech and language. No gross focal neurologic deficits are appreciated. No gait instability. Skin:  Skin is warm, dry and intact. Positive bilateral ankle and foot edema. Foley neurovascularly intact. Psychiatric: Mood and affect are normal. Speech and behavior are normal.  ____________________________________________   LABS (all labs ordered are listed, but only abnormal results are displayed)  Labs Reviewed - No data to display ____________________________________________    PROCEDURES  Procedure(s) performed: None  Critical Care performed: No  ____________________________________________   INITIAL IMPRESSION / ASSESSMENT AND PLAN / ED COURSE  Pertinent labs & imaging results that were available during my care of the patient were reviewed by me and considered in my medical decision making (see chart for details).  Acute exacerbation Bilateral lower extremities. Patient was given Toradol 60 mg IM and Percocet 5/325. Discharged home with Rx for his colchicine and Percocet. Patient follow-up PCP or return to ER with worsening symptomology. ____________________________________________   FINAL CLINICAL IMPRESSION(S) / ED DIAGNOSES  Final diagnoses:  Acute idiopathic gout of right foot  Lead-induced acute gout of left ankle, initial encounter     This chart was dictated using voice recognition software/Dragon. Despite best efforts to proofread, errors can occur which can change the meaning. Any change was purely unintentional.   Evangeline Dakin, PA-C 03/14/16 1556  Emily Filbert, MD 03/15/16 [id number]

## 2016-03-14 NOTE — Discharge Instructions (Signed)
Gout °Gout is when your joints become red, sore, and swell (inflamed). This is caused by the buildup of uric acid crystals in the joints. Uric acid is a chemical that is normally in the blood. If the level of uric acid gets too high in the blood, these crystals form in your joints and tissues. Over time, these crystals can form into masses near the joints and tissues. These masses can destroy bone and cause the bone to look misshapen (deformed). °HOME CARE  °· Do not take aspirin for pain. °· Only take medicine as told by your doctor. °· Rest the joint as much as you can. When in bed, keep sheets and blankets off painful areas. °· Keep the sore joints raised (elevated). °· Put warm or cold packs on painful joints. Use of warm or cold packs depends on which works best for you. °· Use crutches if the painful joint is in your leg. °· Drink enough fluids to keep your pee (urine) clear or pale yellow. Limit alcohol, sugary drinks, and drinks with fructose in them. °· Follow your diet instructions. Pay careful attention to how much protein you eat. Include fruits, vegetables, whole grains, and fat-free or low-fat milk products in your daily diet. Talk to your doctor or dietitian about the use of coffee, vitamin C, and cherries. These may help lower uric acid levels. °· Keep a healthy body weight. °GET HELP RIGHT AWAY IF:  °· You have watery poop (diarrhea), throw up (vomit), or have any side effects from medicines. °· You do not feel better in 24 hours, or you are getting worse. °· Your joint becomes suddenly more tender, and you have chills or a fever. °MAKE SURE YOU:  °· Understand these instructions. °· Will watch your condition. °· Will get help right away if you are not doing well or get worse. °  °This information is not intended to replace advice given to you by your health care provider. Make sure you discuss any questions you have with your health care provider. °  °Document Released: 08/12/2008 Document Revised:  11/24/2014 Document Reviewed: 06/16/2012 °Elsevier Interactive Patient Education ©2016 Elsevier Inc[employer] ° °

## 2016-03-14 NOTE — ED Notes (Signed)
Pt reports having a hx of gout. Pt arrived to ED after three days of increasing swelling and pain. Bilateral ankle warmth noted. Pt reports toe joints are tender as well.

## 2016-03-14 NOTE — ED Notes (Signed)
Pt complains of pain and swelling to bilateral ankles. Pt states he has history of Gout but medication isn't helping. Pt states symptoms started yesterday.

## 2016-03-27 ENCOUNTER — Encounter: Payer: Self-pay | Admitting: Emergency Medicine

## 2016-03-27 ENCOUNTER — Emergency Department
Admission: EM | Admit: 2016-03-27 | Discharge: 2016-03-27 | Disposition: A | Discharge status: Home / Self Care | Payer: Medicare Other | Attending: Emergency Medicine | Admitting: Emergency Medicine

## 2016-03-27 DIAGNOSIS — M10071 Idiopathic gout, right ankle and foot: Secondary | ICD-10-CM | POA: Insufficient documentation

## 2016-03-27 DIAGNOSIS — M25572 Pain in left ankle and joints of left foot: Secondary | ICD-10-CM

## 2016-03-27 DIAGNOSIS — M10072 Idiopathic gout, left ankle and foot: Secondary | ICD-10-CM | POA: Insufficient documentation

## 2016-03-27 DIAGNOSIS — I10 Essential (primary) hypertension: Secondary | ICD-10-CM | POA: Insufficient documentation

## 2016-03-27 DIAGNOSIS — L03211 Cellulitis of face: Secondary | ICD-10-CM | POA: Insufficient documentation

## 2016-03-27 DIAGNOSIS — Z79899 Other long term (current) drug therapy: Secondary | ICD-10-CM | POA: Diagnosis not present

## 2016-03-27 DIAGNOSIS — F17210 Nicotine dependence, cigarettes, uncomplicated: Secondary | ICD-10-CM | POA: Diagnosis not present

## 2016-03-27 DIAGNOSIS — F200 Paranoid schizophrenia: Secondary | ICD-10-CM | POA: Insufficient documentation

## 2016-03-27 DIAGNOSIS — M25571 Pain in right ankle and joints of right foot: Secondary | ICD-10-CM

## 2016-03-27 DIAGNOSIS — M109 Gout, unspecified: Secondary | ICD-10-CM

## 2016-03-27 MED ORDER — CLINDAMYCIN HCL 150 MG PO CAPS
300.0000 mg | ORAL_CAPSULE | Freq: Once | ORAL | Status: AC
  Administered 2016-03-27: 300 mg via ORAL
  Filled 2016-03-27: qty 2

## 2016-03-27 MED ORDER — CLINDAMYCIN HCL 300 MG PO CAPS: 300.0000 mg | ORAL_CAPSULE | Freq: Three times a day (TID) | ORAL | Status: DC

## 2016-03-27 MED ORDER — COLCHICINE 0.6 MG PO TABS
1.2000 mg | ORAL_TABLET | Freq: Once | ORAL | Status: AC
Start: 2016-03-27 — End: 2016-03-27
  Administered 2016-03-27: 1.2 mg via ORAL
  Filled 2016-03-27: qty 2

## 2016-03-27 MED ORDER — COLCHICINE 0.6 MG PO TABS: 0.6000 mg | ORAL_TABLET | Freq: Once | ORAL | Status: DC

## 2016-03-27 MED ORDER — INDOMETHACIN 50 MG PO CAPS
50.0000 mg | ORAL_CAPSULE | Freq: Once | ORAL | Status: AC
  Administered 2016-03-27: 50 mg via ORAL
  Filled 2016-03-27: qty 1

## 2016-03-27 MED ORDER — INDOMETHACIN 50 MG PO CAPS: 50.0000 mg | ORAL_CAPSULE | Freq: Two times a day (BID) | ORAL | Status: DC

## 2016-03-27 MED ORDER — COLCHICINE 0.6 MG PO TABS: ORAL_TABLET | ORAL | Status: DC

## 2016-03-27 NOTE — Discharge Instructions (Signed)
Although no certain cause was found for your ankle pains, I suspect gout.    Your left sided facial swelling may be coming from a skin or dental infection and you are being treated with antibiotic clindamycin  Return to the ER for any worsening pain, other joint pains, any redness or skin rash, fever, calf or muscle pain.   Return to the ER for any worsening skin swelling, facial pain, or certainly any trouble swallowing, mouth swelling, or any other symptoms concerning to you.  Follow up with your Primary Doctor in 1 week.   Ankle Pain Ankle pain is a common symptom. The bones, cartilage, tendons, and muscles of the ankle joint perform a lot of work each day. The ankle joint holds your body weight and allows you to move around. Ankle pain can occur on either side or back of 1 or both ankles. Ankle pain may be sharp and burning or dull and aching. There may be tenderness, stiffness, redness, or warmth around the ankle. The pain occurs more often when a person walks or puts pressure on the ankle. CAUSES  There are many reasons ankle pain can develop. It is important to work with your caregiver to identify the cause since many conditions can impact the bones, cartilage, muscles, and tendons. Causes for ankle pain include:  Injury, including a break (fracture), sprain, or strain often due to a fall, sports, or a high-impact activity.  Swelling (inflammation) of a tendon (tendonitis).  Achilles tendon rupture.  Ankle instability after repeated sprains and strains.  Poor foot alignment.  Pressure on a nerve (tarsal tunnel syndrome).  Arthritis in the ankle or the lining of the ankle.  Crystal formation in the ankle (gout or pseudogout). DIAGNOSIS  A diagnosis is based on your medical history, your symptoms, results of your physical exam, and results of diagnostic tests. Diagnostic tests may include X-ray exams or a computerized magnetic scan (magnetic resonance imaging, MRI). TREATMENT    Treatment will depend on the cause of your ankle pain and may include:  Keeping pressure off the ankle and limiting activities.  Using crutches or other walking support (a cane or brace).  Using rest, ice, compression, and elevation.  Participating in physical therapy or home exercises.  Wearing shoe inserts or special shoes.  Losing weight.  Taking medications to reduce pain or swelling or receiving an injection.  Undergoing surgery. HOME CARE INSTRUCTIONS   Only take over-the-counter or prescription medicines for pain, discomfort, or fever as directed by your caregiver.  Put ice on the injured area.  Put ice in a plastic bag.  Place a towel between your skin and the bag.  Leave the ice on for 15-20 minutes at a time, 03-04 times a day.  Keep your leg raised (elevated) when possible to lessen swelling.  Avoid activities that cause ankle pain.  Follow specific exercises as directed by your caregiver.  Record how often you have ankle pain, the location of the pain, and what it feels like. This information may be helpful to you and your caregiver.  Ask your caregiver about returning to work or sports and whether you should drive.  Follow up with your caregiver for further examination, therapy, or testing as directed. SEEK MEDICAL CARE IF:   Pain or swelling continues or worsens beyond 1 week.  You have an oral temperature above 102 F (38.9 C).  You are feeling unwell or have chills.  You are having an increasingly difficult time with walking.  You  have loss of sensation or other new symptoms.  You have questions or concerns. MAKE SURE YOU:   Understand these instructions.  Will watch your condition.  Will get help right away if you are not doing well or get worse.   This information is not intended to replace advice given to you by your health care provider. Make sure you discuss any questions you have with your health care provider.   Document  Released: 04/23/2010 Document Revised: 01/26/2012 Document Reviewed: 06/05/2015 Elsevier Interactive Patient Education 2016 Elsevier Inc.  Gout Gout is when your joints become red, sore, and swell (inflamed). This is caused by the buildup of uric acid crystals in the joints. Uric acid is a chemical that is normally in the blood. If the level of uric acid gets too high in the blood, these crystals form in your joints and tissues. Over time, these crystals can form into masses near the joints and tissues. These masses can destroy bone and cause the bone to look misshapen (deformed). HOME CARE   Do not take aspirin for pain.  Only take medicine as told by your doctor.  Rest the joint as much as you can. When in bed, keep sheets and blankets off painful areas.  Keep the sore joints raised (elevated).  Put warm or cold packs on painful joints. Use of warm or cold packs depends on which works best for you.  Use crutches if the painful joint is in your leg.  Drink enough fluids to keep your pee (urine) clear or pale yellow. Limit alcohol, sugary drinks, and drinks with fructose in them.  Follow your diet instructions. Pay careful attention to how much protein you eat. Include fruits, vegetables, whole grains, and fat-free or low-fat milk products in your daily diet. Talk to your doctor or dietitian about the use of coffee, vitamin C, and cherries. These may help lower uric acid levels.  Keep a healthy body weight. GET HELP RIGHT AWAY IF:   You have watery poop (diarrhea), throw up (vomit), or have any side effects from medicines.  You do not feel better in 24 hours, or you are getting worse.  Your joint becomes suddenly more tender, and you have chills or a fever. MAKE SURE YOU:   Understand these instructions.  Will watch your condition.  Will get help right away if you are not doing well or get worse.   This information is not intended to replace advice given to you by your health  care provider. Make sure you discuss any questions you have with your health care provider.   Document Released: 08/12/2008 Document Revised: 11/24/2014 Document Reviewed: 06/16/2012 Elsevier Interactive Patient Education [employer]

## 2016-03-27 NOTE — ED Provider Notes (Signed)
[facility] Emergency Department Provider Note   ____________________________________________  Time seen: Approximately 10:30 AM I have reviewed the triage vital signs and the triage nursing note.  HISTORY  Chief Complaint Gout   Historian Patient  HPI Walter Pena. is a 35 y.o. male with a history of gout, hypertension, head hidradenitis, and schizophrenia, is herefor evaluation of bilateral ankle pain which was worse for about 24 hours. He does have a history of gout in both ankles. No fever. No skin rash. No trauma or twisting of the ankles. No skin rash.  Does not report taking any medication for his ankles pain. Symptoms are moderate. Moving his ankle still makes it worse.  Also complains of 1 day of left face swelling and left ear pain.  He tried peroxide in the ear with no improvement.    Past Medical History  Diagnosis Date  . Gout   . Hypertension   . Schizophrenia [facility])     Patient Active Problem List   Diagnosis Date Noted  . HTN (hypertension) 01/21/2016  . Cannabis use disorder, moderate, dependence (HCC) 01/21/2016  . Tobacco use disorder 01/21/2016  . Gout 01/21/2016  . UTI (urinary tract infection) 01/21/2016  . Hidradenitis 01/21/2016  . Schizophrenia, paranoid, chronic (HCC) 01/20/2016    History reviewed. No pertinent past surgical history.  Current Outpatient Rx  Name  Route  Sig  Dispense  Refill  . amLODipine-benazepril (LOTREL) 10-40 MG capsule   Oral   Take 1 capsule by mouth daily.   30 capsule   0   . clindamycin (CLEOCIN) 300 MG capsule   Oral   Take 1 capsule (300 mg total) by mouth 3 (three) times daily.   30 capsule   0   . colchicine 0.6 MG tablet      1 tablet by mouth daily until gout pain is gone.   15 tablet   0   . haloperidol (HALDOL) 10 MG tablet   Oral   Take 1 tablet (10 mg total) by mouth at bedtime.   30 tablet   0   . haloperidol decanoate (HALDOL DECANOATE) 100 MG/ML  injection   Intramuscular   Inject 1 mL (100 mg total) into the muscle every 30 (thirty) days.   1 mL   1   . indomethacin (INDOCIN) 50 MG capsule   Oral   Take 1 capsule (50 mg total) by mouth 2 (two) times daily with a meal.   30 capsule   0   . oxyCODONE-acetaminophen (ROXICET) 5-325 MG tablet   Oral   Take 1-2 tablets by mouth every 4 (four) hours as needed for severe pain.   15 tablet   0   . temazepam (RESTORIL) 15 MG capsule   Oral   Take 1 capsule (15 mg total) by mouth at bedtime.   30 capsule   0     Allergies Bactrim and Lisinopril  No family history on file.  Social History Social History  Substance Use Topics  . Smoking status: Current Every Day Smoker -- 1.00 packs/day    Types: Cigarettes  . Smokeless tobacco: Never Used  . Alcohol Use: Yes    Review of Systems  Constitutional: Negative for fever. Eyes: Negative for visual changes. ENT: Negative for sore throat. Cardiovascular: Negative for chest pain. Respiratory: Negative for shortness of breath. Gastrointestinal: Negative for abdominal pain, vomiting and diarrhea. Genitourinary: Negative for dysuria. Musculoskeletal: Negative for back pain. Skin: Negative for rash, but left  face swelling Neurological: Negative for headache. 10 point Review of Systems otherwise negative ____________________________________________   PHYSICAL EXAM:  VITAL SIGNS: ED Triage Vitals  Enc Vitals Group     BP 03/27/16 0911 122/83 mmHg     Pulse Rate 03/27/16 0911 77     Resp 03/27/16 0911 16     Temp 03/27/16 0911 98.2 F (36.8 C)     Temp Source 03/27/16 0911 Oral     SpO2 03/27/16 0911 96 %     Weight 03/27/16 0911 238 lb (107.956 kg)     Height 03/27/16 0911 5\' 8"  (1.727 m)     Head Cir --      Peak Flow --      Pain Score 03/27/16 0915 10     Pain Loc --      Pain Edu? --      Excl. in GC? --      Constitutional: Alert and oriented. Well appearing and in no distress. HEENT   Head:  Normocephalic and atraumatic.  Scarring acne? Both cheeks -- some swelling and tenderness of left cheek towards the left ear.  No redness, fluctuance or induration appreciable.      Eyes: Conjunctivae are normal. PERRL. Normal extraocular movements.      Ears:   Ear wax removed left, tm normal.      Nose: No congestion/rhinnorhea.   Mouth/Throat: Mucous membranes are moist.  Missing teeth in the back   Neck: No stridor. Cardiovascular/Chest: Normal rate, regular rhythm.  No murmurs, rubs, or gallops. Respiratory: Normal respiratory effort without tachypnea nor retractions. Breath sounds are clear and equal bilaterally. No wheezes/rales/rhonchi. Gastrointestinal: Soft. No distention, no guarding, no rebound. Nontender.    Genitourinary/rectal:Deferred Musculoskeletal: Ankles mildly swollen, left more so than right.  No redness, mild warmth.  No skin rash.  Full range of motion, but sore/tender. Neurologic:  Normal speech and language. No gross or focal neurologic deficits are appreciated. Skin:  Skin is warm, dry and intact. Dark skin scarring to bilateral face Psychiatric: Flat affect.  ____________________________________________   EKG I, Governor Rooks, MD, the attending physician have personally viewed and interpreted all ECGs.  none ____________________________________________  LABS (pertinent positives/negatives)  none  ____________________________________________  RADIOLOGY All Xrays were viewed by me. Imaging interpreted by Radiologist.  none __________________________________________  PROCEDURES  Procedure(s) performed: None  Critical Care performed: None  ____________________________________________   ED COURSE / ASSESSMENT AND PLAN  Pertinent labs & imaging results that were available during my care of the patient were reviewed by me and considered in my medical decision making (see chart for details).   Patient treated for presumptive clinical  gout.     CONSULTATIONS:   none   Patient / Family / Caregiver informed of clinical course, medical decision-making process, and agree with plan.   I discussed return precautions, follow-up instructions, and discharged instructions with patient and/or family.   ___________________________________________   FINAL CLINICAL IMPRESSION(S) / ED DIAGNOSES   Final diagnoses:  Bilateral ankle pain  Acute gout of left ankle, unspecified cause  Acute gout of right ankle, unspecified cause  Cellulitis, face              Note: This dictation was prepared with Dragon dictation. Any transcriptional errors that result from this process are unintentional   Governor Rooks, MD 03/29/16 [id number]

## 2016-03-27 NOTE — ED Notes (Signed)
NAD noted at time of D/C. Pt refused wheelchair to the lobby. Pt ambulatory to the lobby. MD aware of pt's BP, pt instructed to take BP medications when he gets home due to not having had them today. Pt alert and oriented at this time. Pt instructed to follow up with PCP this week.

## 2016-03-27 NOTE — ED Notes (Signed)
Pt presents to ED with bilateral ankle pain and swelling. Pt states was seen here about a week ago and given a couple of shots but the pain has not resolved.

## 2016-04-24 ENCOUNTER — Emergency Department
Admission: EM | Admit: 2016-04-24 | Discharge: 2016-04-24 | Disposition: A | Discharge status: Home / Self Care | Payer: Medicare Other | Attending: Emergency Medicine | Admitting: Emergency Medicine

## 2016-04-24 ENCOUNTER — Encounter: Payer: Self-pay | Admitting: Emergency Medicine

## 2016-04-24 DIAGNOSIS — I10 Essential (primary) hypertension: Secondary | ICD-10-CM | POA: Diagnosis not present

## 2016-04-24 DIAGNOSIS — M10071 Idiopathic gout, right ankle and foot: Secondary | ICD-10-CM | POA: Diagnosis not present

## 2016-04-24 DIAGNOSIS — A5401 Gonococcal cystitis and urethritis, unspecified: Secondary | ICD-10-CM | POA: Diagnosis not present

## 2016-04-24 DIAGNOSIS — F200 Paranoid schizophrenia: Secondary | ICD-10-CM | POA: Insufficient documentation

## 2016-04-24 DIAGNOSIS — M79671 Pain in right foot: Secondary | ICD-10-CM | POA: Diagnosis present

## 2016-04-24 DIAGNOSIS — F17210 Nicotine dependence, cigarettes, uncomplicated: Secondary | ICD-10-CM | POA: Insufficient documentation

## 2016-04-24 LAB — URINALYSIS COMPLETE WITH MICROSCOPIC (ARMC ONLY)
BILIRUBIN URINE: NEGATIVE
Bacteria, UA: NONE SEEN
Glucose, UA: NEGATIVE mg/dL
KETONES UR: NEGATIVE mg/dL
NITRITE: NEGATIVE
PROTEIN: NEGATIVE mg/dL
Specific Gravity, Urine: 1.014 (ref 1.005–1.030)
Squamous Epithelial / LPF: NONE SEEN
pH: 6 (ref 5.0–8.0)

## 2016-04-24 LAB — CBC WITH DIFFERENTIAL/PLATELET
Basophils Absolute: 0.1 10*3/uL (ref 0–0.1)
Eosinophils Absolute: 0.1 10*3/uL (ref 0–0.7)
Eosinophils Relative: 1 %
HCT: 39 % — ABNORMAL LOW (ref 40.0–52.0)
HEMOGLOBIN: 13.2 g/dL (ref 13.0–18.0)
LYMPHS ABS: 2.2 10*3/uL (ref 1.0–3.6)
Lymphocytes Relative: 14 %
MCH: 29.9 pg (ref 26.0–34.0)
MCHC: 33.9 g/dL (ref 32.0–36.0)
MCV: 88.1 fL (ref 80.0–100.0)
Monocytes Absolute: 0.9 10*3/uL (ref 0.2–1.0)
NEUTROS ABS: 11.8 10*3/uL — AB (ref 1.4–6.5)
Platelets: 286 10*3/uL (ref 150–440)
RBC: 4.42 MIL/uL (ref 4.40–5.90)
RDW: 16.2 % — ABNORMAL HIGH (ref 11.5–14.5)
WBC: 15.1 10*3/uL — ABNORMAL HIGH (ref 3.8–10.6)

## 2016-04-24 LAB — CHLAMYDIA/NGC RT PCR (ARMC ONLY)
CHLAMYDIA TR: NOT DETECTED
N gonorrhoeae: DETECTED — AB

## 2016-04-24 LAB — URIC ACID: URIC ACID, SERUM: 5.7 mg/dL (ref 4.4–7.6)

## 2016-04-24 LAB — SEDIMENTATION RATE: Sed Rate: 84 mm/hr — ABNORMAL HIGH (ref 0–15)

## 2016-04-24 MED ORDER — NAPROXEN 500 MG PO TABS
500.0000 mg | ORAL_TABLET | Freq: Once | ORAL | Status: AC
  Administered 2016-04-24: 500 mg via ORAL
  Filled 2016-04-24: qty 1

## 2016-04-24 MED ORDER — COLCHICINE 0.6 MG PO TABS
1.2000 mg | ORAL_TABLET | Freq: Once | ORAL | Status: AC
  Administered 2016-04-24: 1.2 mg via ORAL
  Filled 2016-04-24: qty 2

## 2016-04-24 MED ORDER — LIDOCAINE HCL (PF) 1 % IJ SOLN
INTRAMUSCULAR | Status: AC
  Filled 2016-04-24: qty 5

## 2016-04-24 MED ORDER — OXYCODONE-ACETAMINOPHEN 5-325 MG PO TABS: 1.0000 | ORAL_TABLET | ORAL | Status: DC | PRN

## 2016-04-24 MED ORDER — COLCHICINE 0.6 MG PO TABS: ORAL_TABLET | ORAL | Status: DC

## 2016-04-24 MED ORDER — AZITHROMYCIN 500 MG PO TABS
1000.0000 mg | ORAL_TABLET | Freq: Once | ORAL | Status: AC
  Administered 2016-04-24: 1000 mg via ORAL
  Filled 2016-04-24: qty 2

## 2016-04-24 MED ORDER — INDOMETHACIN 50 MG PO CAPS: 50.0000 mg | ORAL_CAPSULE | Freq: Two times a day (BID) | ORAL | Status: DC

## 2016-04-24 MED ORDER — CEFTRIAXONE SODIUM 250 MG IJ SOLR
250.0000 mg | Freq: Once | INTRAMUSCULAR | Status: AC
Start: 2016-04-24 — End: 2016-04-24
  Administered 2016-04-24: 250 mg via INTRAMUSCULAR
  Filled 2016-04-24: qty 250

## 2016-04-24 NOTE — ED Notes (Signed)
Pt reports increased discharge with burning. Pt reports there is a foul odor to the discharge. Pt denies rashes or bumps on the penis.

## 2016-04-24 NOTE — ED Provider Notes (Signed)
[facility] Emergency Department Provider Note   ____________________________________________  Time seen: Approximately 5:20 PM  I have reviewed the triage vital signs and the nursing notes.   HISTORY  Chief Complaint Foot Pain and Exposure to STD    HPI Walter Vien. is a 35 y.o. male patient complain of bilateral ankle pain which has increased the last 24 hours. Patient state has a history of gout that affects his ankles. Patient denies any trauma to his ankles. Patient stated pain increases with ambulation.Patient also complaining of urethral discharge with dysuria. Patient stated there is a foul odor to his penial discharge. Patient last sexual contact was 3 days ago. Patient stated discharge started today. Patient denies any lesions to the penis. Patient rated his pain discomfort as a 10 over 10. No palliative measures taken for this complaint.   Past Medical History  Diagnosis Date  . Gout   . Hypertension   . Schizophrenia [facility])     Patient Active Problem List   Diagnosis Date Noted  . HTN (hypertension) 01/21/2016  . Cannabis use disorder, moderate, dependence (HCC) 01/21/2016  . Tobacco use disorder 01/21/2016  . Gout 01/21/2016  . UTI (urinary tract infection) 01/21/2016  . Hidradenitis 01/21/2016  . Schizophrenia, paranoid, chronic (HCC) 01/20/2016    History reviewed. No pertinent past surgical history.  Current Outpatient Rx  Name  Route  Sig  Dispense  Refill  . amLODipine-benazepril (LOTREL) 10-40 MG capsule   Oral   Take 1 capsule by mouth daily.   30 capsule   0   . clindamycin (CLEOCIN) 300 MG capsule   Oral   Take 1 capsule (300 mg total) by mouth 3 (three) times daily.   30 capsule   0   . colchicine 0.6 MG tablet      1 tablet by mouth daily until gout pain is gone.   15 tablet   0   . haloperidol (HALDOL) 10 MG tablet   Oral   Take 1 tablet (10 mg total) by mouth at bedtime.   30 tablet   0   .  haloperidol decanoate (HALDOL DECANOATE) 100 MG/ML injection   Intramuscular   Inject 1 mL (100 mg total) into the muscle every 30 (thirty) days.   1 mL   1   . indomethacin (INDOCIN) 50 MG capsule   Oral   Take 1 capsule (50 mg total) by mouth 2 (two) times daily with a meal.   30 capsule   0   . oxyCODONE-acetaminophen (ROXICET) 5-325 MG tablet   Oral   Take 1-2 tablets by mouth every 4 (four) hours as needed for severe pain.   15 tablet   0   . temazepam (RESTORIL) 15 MG capsule   Oral   Take 1 capsule (15 mg total) by mouth at bedtime.   30 capsule   0     Allergies Bactrim and Lisinopril  No family history on file.  Social History Social History  Substance Use Topics  . Smoking status: Current Every Day Smoker -- 1.00 packs/day    Types: Cigarettes  . Smokeless tobacco: Never Used  . Alcohol Use: Yes    Review of Systems Constitutional: No fever/chills Eyes: No visual changes. ENT: No sore throat. Cardiovascular: Denies chest pain. Respiratory: Denies shortness of breath. Gastrointestinal: No abdominal pain.  No nausea, no vomiting.  No diarrhea.  No constipation. Genitourinary: Negative for dysuria. Musculoskeletal: Negative for back pain. Skin: Negative for  rash. Neurological: Negative for headaches, focal weakness or numbness. Psychiatric:Schizophrenia Endocrine:Hypertension Allergic/Immunilogical: Bactrim and lisinopril  ____________________________________________   PHYSICAL EXAM:  VITAL SIGNS: ED Triage Vitals  Enc Vitals Group     BP 04/24/16 1701 140/94 mmHg     Pulse Rate 04/24/16 1701 87     Resp 04/24/16 1701 18     Temp 04/24/16 1701 98.9 F (37.2 C)     Temp Source 04/24/16 1701 Oral     SpO2 04/24/16 1701 97 %     Weight 04/24/16 1701 237 lb (107.502 kg)     Height 04/24/16 1701 5\' 8"  (1.727 m)     Head Cir --      Peak Flow --      Pain Score 04/24/16 1702 10     Pain Loc --      Pain Edu? --      Excl. in GC? --      Constitutional: Alert and oriented. Well appearing and in no acute distress. Eyes: Conjunctivae are normal. PERRL. EOMI. Head: Atraumatic. Nose: No congestion/rhinnorhea. Mouth/Throat: Mucous membranes are moist.  Oropharynx non-erythematous. Neck: No stridor.  No cervical spine tenderness to palpation. Hematological/Lymphatic/Immunilogical: No cervical lymphadenopathy. Cardiovascular: Normal rate, regular rhythm. Grossly normal heart sounds.  Good peripheral circulation. Respiratory: Normal respiratory effort.  No retractions. Lungs CTAB. Gastrointestinal: Soft and nontender. No distention. No abdominal bruits. No CVA tenderness. Genitourinary: No penial lesions no active discharge. Musculoskeletal: No edema or erythema to the bilateral ankles. Patient has some moderate guarding palpation of the medial malleolus bilaterally. Neurologic:  Normal speech and language. No gross focal neurologic deficits are appreciated. No gait instability. Skin:  Skin is warm, dry and intact. No rash noted. Psychiatric: Mood and affect are normal. Speech and behavior are normal.  ____________________________________________   LABS (all labs ordered are listed, but only abnormal results are displayed)  Labs Reviewed  CBC WITH DIFFERENTIAL/PLATELET - Abnormal; Notable for the following:    WBC 15.1 (*)    HCT 39.0 (*)    RDW 16.2 (*)    Neutro Abs 11.8 (*)    All other components within normal limits  URINALYSIS COMPLETEWITH MICROSCOPIC ([facility] ONLY) - Abnormal; Notable for the following:    Color, Urine YELLOW (*)    APPearance CLOUDY (*)    Hgb urine dipstick 1+ (*)    Leukocytes, UA 3+ (*)    All other components within normal limits  CHLAMYDIA/NGC RT PCR ([facility] ONLY)  URIC ACID  SEDIMENTATION RATE    ____________________________________________  EKG   ____________________________________________  RADIOLOGY   ____________________________________________   PROCEDURES  Procedure(s) performed: None  Critical Care performed: No  ____________________________________________   INITIAL IMPRESSION / ASSESSMENT AND PLAN / ED COURSE  Pertinent labs & imaging results that were available during my care of the patient were reviewed by me and considered in my medical decision making (see chart for details).  Urethritis and gout. Patient given Rocephin and Zithromax in the ED. Patient also given colchicine and naproxen. Patient advised to follow-up with his family doctor for continued care of his gout. Patient advised follow-up with the community health department for STD. Patient given a prescription for colchicine, Indocin, and Percocets. ____________________________________________   FINAL CLINICAL IMPRESSION(S) / ED DIAGNOSES  Final diagnoses:  Urethritis, gonococcal, acute  Acute idiopathic gout of right ankle      NEW MEDICATIONS STARTED DURING THIS VISIT:  New Prescriptions   No medications on file     Note:  This document  was prepared using Conservation officer, historic buildings and may include unintentional dictation errors.    Joni Reining, PA-C 04/24/16 1836  Emily Filbert, MD 05/24/16 [phone]

## 2016-04-24 NOTE — ED Notes (Signed)
C/O gout flare up to right foot.  Patient c/o possible STD exposure.

## 2016-04-24 NOTE — Discharge Instructions (Signed)
Gonorrhea Gonorrhea is an infection that can cause serious problems. If left untreated, the infection may:   Damage the male or male organs.   Cause women to be unable to have children (sterility).   Harm a fetus if the infected woman is pregnant.  It is important to get treatment for gonorrhea as soon as possible. It is also necessary that all your sexual partners be tested for the infection.  CAUSES  Gonorrhea is caused by bacteria called Neisseria gonorrhoeae. The infection is spread from person to person, usually by sexual contact (such as by anal, vaginal, or oral means). A newborn can contract the infection from his or her mother during birth.  RISK FACTORS  Being a woman younger than 36 years of age who is sexually active.  Being a woman 48 years of age or older who has:  A new sex partner.  More than one sex partner.  A sex partner who has a sexually transmitted disease (STD).  Using condoms inconsistently.  Currently having, or having previously had, an STD.  Exchanging sex or money or drugs. SYMPTOMS  Some people with gonorrhea do not have symptoms. Symptoms may be different in females and males.  Females The most common symptoms are:   Pain in the lower abdomen.   Fever with or without chills.  Other symptoms include:   Abnormal vaginal discharge.   Painful intercourse.   Burning or itching of the vagina or lips of the vagina.   Abnormal vaginal bleeding.   Pain when urinating.   Long-lasting (chronic) pain in the lower abdomen, especially during menstruation or intercourse.   Inability to become pregnant.   Going into premature labor.   Irritation, pain, bleeding, or discharge from the rectum. This may occur if the infection was spread by anal sex.   Sore throat or swollen lymph nodes in the neck. This may occur if the infection was spread by oral sex.  Males The most common symptoms are:   Discharge from the penis.   Pain  or burning during urination.   Pain or swelling in the testicles. Other symptoms may include:   Irritation, pain, bleeding, or discharge from the rectum. This may occur if the infection was spread by anal sex.   Sore throat, fever, or swollen lymph nodes in the neck. This may occur if the infection was spread by oral sex.  DIAGNOSIS  A diagnosis is made after a physical exam is done and a sample of discharge is examined under a microscope for the presence of the bacteria. The discharge may be taken from the urethra, cervix, throat, or rectum.  TREATMENT  Gonorrhea is treated with antibiotic medicines. It is important for treatment to begin as soon as possible. Early treatment may prevent some problems from developing. Do not have sex. Avoid all types of sexual activity for 7 days after treatment is complete and until any sex partners have been treated. HOME CARE INSTRUCTIONS   Take medicines only as directed by your health care provider.   Take your antibiotic medicine as directed by your health care provider. Finish the antibiotic even if you start to feel better. Incomplete treatment will put you at risk for continued infection.   Do not have sex until treatment is complete or as directed by your health care provider.   Keep all follow-up visits as directed by your health care provider.   Not all test results are available during your visit. If your test results are not back  during the visit, make an appointment with your health care provider to find out the results. Do not assume everything is normal if you have not heard from your health care provider or the medical facility. It is your responsibility to get your test results.  If you test positive for gonorrhea, inform your recent sexual partners. They need to be checked for gonorrhea even if they do not have symptoms. They may need treatment, even if they test negative for gonorrhea.  SEEK MEDICAL CARE IF:   You develop any  bad reaction to the medicine you were prescribed. This may include:   A rash.   Nausea.   Vomiting.   Diarrhea.   Your symptoms do not improve after a few days of taking antibiotics.   Your symptoms get worse.   You develop increased pain, such as in the testicles (for males) or in the abdomen (for females).  You have a fever. MAKE SURE YOU:   Understand these instructions.  Will watch your condition.  Will get help right away if you are not doing well or get worse.   This information is not intended to replace advice given to you by your health care provider. Make sure you discuss any questions you have with your health care provider.   Document Released: 10/31/2000 Document Revised: 11/24/2014 Document Reviewed: 05/11/2013 Elsevier Interactive Patient Education 2016 Elsevier Inc.  Gout Gout is when your joints become red, sore, and swell (inflamed). This is caused by the buildup of uric acid crystals in the joints. Uric acid is a chemical that is normally in the blood. If the level of uric acid gets too high in the blood, these crystals form in your joints and tissues. Over time, these crystals can form into masses near the joints and tissues. These masses can destroy bone and cause the bone to look misshapen (deformed). HOME CARE   Do not take aspirin for pain.  Only take medicine as told by your doctor.  Rest the joint as much as you can. When in bed, keep sheets and blankets off painful areas.  Keep the sore joints raised (elevated).  Put warm or cold packs on painful joints. Use of warm or cold packs depends on which works best for you.  Use crutches if the painful joint is in your leg.  Drink enough fluids to keep your pee (urine) clear or pale yellow. Limit alcohol, sugary drinks, and drinks with fructose in them.  Follow your diet instructions. Pay careful attention to how much protein you eat. Include fruits, vegetables, whole grains, and fat-free or  low-fat milk products in your daily diet. Talk to your doctor or dietitian about the use of coffee, vitamin C, and cherries. These may help lower uric acid levels.  Keep a healthy body weight. GET HELP RIGHT AWAY IF:   You have watery poop (diarrhea), throw up (vomit), or have any side effects from medicines.  You do not feel better in 24 hours, or you are getting worse.  Your joint becomes suddenly more tender, and you have chills or a fever. MAKE SURE YOU:   Understand these instructions.  Will watch your condition.  Will get help right away if you are not doing well or get worse.   This information is not intended to replace advice given to you by your health care provider. Make sure you discuss any questions you have with your health care provider.   Document Released: 08/12/2008 Document Revised: 11/24/2014 Document Reviewed: 06/16/2012  Elsevier Interactive Patient Education ©2016 Elsevier Inc. ° °

## 2016-04-25 ENCOUNTER — Telehealth: Payer: Self-pay | Admitting: Emergency Medicine

## 2016-04-25 NOTE — ED Notes (Signed)
Called patient to inform him of positive gonorrhea test.  patietn was treated in the ED and diagnosed in the ED.  There was no answer and no voicemail set up.

## 2016-04-29 ENCOUNTER — Telehealth: Payer: Self-pay | Admitting: Emergency Medicine

## 2016-05-07 ENCOUNTER — Emergency Department
Admission: EM | Admit: 2016-05-07 | Discharge: 2016-05-07 | Disposition: A | Discharge status: Home / Self Care | Payer: Medicare Other | Attending: Emergency Medicine | Admitting: Emergency Medicine

## 2016-05-07 ENCOUNTER — Encounter: Payer: Self-pay | Admitting: Emergency Medicine

## 2016-05-07 ENCOUNTER — Inpatient Hospital Stay
Admission: EM | Admit: 2016-05-07 | Discharge: 2016-05-12 | DRG: 885 | Disposition: A | Discharge status: Home / Self Care | Payer: Medicare Other | Source: Intra-hospital | Attending: Psychiatry | Admitting: Psychiatry

## 2016-05-07 DIAGNOSIS — Z79899 Other long term (current) drug therapy: Secondary | ICD-10-CM

## 2016-05-07 DIAGNOSIS — L732 Hidradenitis suppurativa: Secondary | ICD-10-CM | POA: Diagnosis present

## 2016-05-07 DIAGNOSIS — F82 Specific developmental disorder of motor function: Secondary | ICD-10-CM | POA: Diagnosis present

## 2016-05-07 DIAGNOSIS — Z9114 Patient's other noncompliance with medication regimen: Secondary | ICD-10-CM | POA: Diagnosis not present

## 2016-05-07 DIAGNOSIS — G4700 Insomnia, unspecified: Secondary | ICD-10-CM | POA: Diagnosis present

## 2016-05-07 DIAGNOSIS — Z91199 Patient's noncompliance with other medical treatment and regimen due to unspecified reason: Secondary | ICD-10-CM

## 2016-05-07 DIAGNOSIS — B3749 Other urogenital candidiasis: Secondary | ICD-10-CM

## 2016-05-07 DIAGNOSIS — I10 Essential (primary) hypertension: Secondary | ICD-10-CM | POA: Diagnosis not present

## 2016-05-07 DIAGNOSIS — L0292 Furuncle, unspecified: Secondary | ICD-10-CM | POA: Diagnosis not present

## 2016-05-07 DIAGNOSIS — F209 Schizophrenia, unspecified: Secondary | ICD-10-CM | POA: Diagnosis present

## 2016-05-07 DIAGNOSIS — F200 Paranoid schizophrenia: Secondary | ICD-10-CM | POA: Diagnosis present

## 2016-05-07 DIAGNOSIS — N5089 Other specified disorders of the male genital organs: Secondary | ICD-10-CM | POA: Diagnosis not present

## 2016-05-07 DIAGNOSIS — F29 Unspecified psychosis not due to a substance or known physiological condition: Secondary | ICD-10-CM | POA: Diagnosis not present

## 2016-05-07 DIAGNOSIS — M109 Gout, unspecified: Secondary | ICD-10-CM | POA: Diagnosis present

## 2016-05-07 DIAGNOSIS — Z716 Tobacco abuse counseling: Secondary | ICD-10-CM | POA: Diagnosis not present

## 2016-05-07 DIAGNOSIS — Z9119 Patient's noncompliance with other medical treatment and regimen: Secondary | ICD-10-CM | POA: Diagnosis not present

## 2016-05-07 DIAGNOSIS — Z046 Encounter for general psychiatric examination, requested by authority: Secondary | ICD-10-CM | POA: Diagnosis present

## 2016-05-07 DIAGNOSIS — F17210 Nicotine dependence, cigarettes, uncomplicated: Secondary | ICD-10-CM | POA: Diagnosis present

## 2016-05-07 DIAGNOSIS — F201 Disorganized schizophrenia: Secondary | ICD-10-CM | POA: Diagnosis not present

## 2016-05-07 DIAGNOSIS — Z91148 Patient's other noncompliance with medication regimen for other reason: Secondary | ICD-10-CM

## 2016-05-07 DIAGNOSIS — Z888 Allergy status to other drugs, medicaments and biological substances status: Secondary | ICD-10-CM | POA: Diagnosis not present

## 2016-05-07 DIAGNOSIS — G4710 Hypersomnia, unspecified: Secondary | ICD-10-CM | POA: Diagnosis present

## 2016-05-07 DIAGNOSIS — F203 Undifferentiated schizophrenia: Secondary | ICD-10-CM

## 2016-05-07 LAB — COMPREHENSIVE METABOLIC PANEL
ALT: 36 U/L (ref 17–63)
ANION GAP: 8 (ref 5–15)
AST: 34 U/L (ref 15–41)
Albumin: 4.3 g/dL (ref 3.5–5.0)
Alkaline Phosphatase: 64 U/L (ref 38–126)
BILIRUBIN TOTAL: 0.8 mg/dL (ref 0.3–1.2)
BUN: 8 mg/dL (ref 6–20)
CO2: 29 mmol/L (ref 22–32)
Calcium: 9.2 mg/dL (ref 8.9–10.3)
Chloride: 101 mmol/L (ref 101–111)
Creatinine, Ser: 1.1 mg/dL (ref 0.61–1.24)
Glucose, Bld: 118 mg/dL — ABNORMAL HIGH (ref 65–99)
POTASSIUM: 3.7 mmol/L (ref 3.5–5.1)
Sodium: 138 mmol/L (ref 135–145)
TOTAL PROTEIN: 9.4 g/dL — AB (ref 6.5–8.1)

## 2016-05-07 LAB — URINE DRUG SCREEN, QUALITATIVE (ARMC ONLY)
AMPHETAMINES, UR SCREEN: NOT DETECTED
BENZODIAZEPINE, UR SCRN: NOT DETECTED
Barbiturates, Ur Screen: NOT DETECTED
Cannabinoid 50 Ng, Ur ~~LOC~~: POSITIVE — AB
Cocaine Metabolite,Ur ~~LOC~~: POSITIVE — AB
MDMA (ECSTASY) UR SCREEN: NOT DETECTED
METHADONE SCREEN, URINE: NOT DETECTED
Opiate, Ur Screen: NOT DETECTED
Phencyclidine (PCP) Ur S: NOT DETECTED
TRICYCLIC, UR SCREEN: NOT DETECTED

## 2016-05-07 LAB — CBC
HCT: 42.2 % (ref 40.0–52.0)
Hemoglobin: 14.8 g/dL (ref 13.0–18.0)
MCH: 30.9 pg (ref 26.0–34.0)
MCHC: 35 g/dL (ref 32.0–36.0)
MCV: 88.3 fL (ref 80.0–100.0)
PLATELETS: 286 10*3/uL (ref 150–440)
RBC: 4.78 MIL/uL (ref 4.40–5.90)
RDW: 15.9 % — AB (ref 11.5–14.5)
WBC: 8.6 10*3/uL (ref 3.8–10.6)

## 2016-05-07 LAB — ACETAMINOPHEN LEVEL: Acetaminophen (Tylenol), Serum: <10 ug/mL — ABNORMAL LOW (ref 10–30)

## 2016-05-07 LAB — SALICYLATE LEVEL

## 2016-05-07 LAB — ETHANOL

## 2016-05-07 MED ORDER — BENAZEPRIL HCL 20 MG PO TABS
40.0000 mg | ORAL_TABLET | Freq: Every day | ORAL | Status: DC
  Administered 2016-05-07: 40 mg via ORAL
  Filled 2016-05-07: qty 2

## 2016-05-07 MED ORDER — HALOPERIDOL DECANOATE 100 MG/ML IM SOLN
100.0000 mg | INTRAMUSCULAR | Status: DC
  Administered 2016-05-07: 100 mg via INTRAMUSCULAR
  Filled 2016-05-07: qty 1

## 2016-05-07 MED ORDER — BENAZEPRIL HCL 20 MG PO TABS
40.0000 mg | ORAL_TABLET | Freq: Every day | ORAL | Status: DC
  Administered 2016-05-08 – 2016-05-12 (×5): 40 mg via ORAL
  Filled 2016-05-07 (×6): qty 2

## 2016-05-07 MED ORDER — CLINDAMYCIN HCL 150 MG PO CAPS
150.0000 mg | ORAL_CAPSULE | Freq: Four times a day (QID) | ORAL | Status: DC
  Administered 2016-05-07 (×2): 150 mg via ORAL
  Filled 2016-05-07 (×2): qty 1

## 2016-05-07 MED ORDER — MAGNESIUM HYDROXIDE 400 MG/5ML PO SUSP: 30.0000 mL | Freq: Every day | ORAL | Status: DC | PRN

## 2016-05-07 MED ORDER — HALOPERIDOL 5 MG PO TABS: 10.0000 mg | ORAL_TABLET | Freq: Every day | ORAL | Status: DC

## 2016-05-07 MED ORDER — AMLODIPINE BESYLATE 5 MG PO TABS
ORAL_TABLET | ORAL | Status: AC
  Administered 2016-05-07: 10 mg via ORAL
  Filled 2016-05-07: qty 2

## 2016-05-07 MED ORDER — HALOPERIDOL DECANOATE 100 MG/ML IM SOLN
100.0000 mg | INTRAMUSCULAR | Status: AC
  Filled 2016-05-07: qty 1

## 2016-05-07 MED ORDER — NYSTATIN 100000 UNIT/GM EX POWD
Freq: Once | CUTANEOUS | Status: AC
Start: 2016-05-07 — End: 2016-05-07
  Administered 2016-05-07: 14:00:00 via TOPICAL
  Filled 2016-05-07: qty 15

## 2016-05-07 MED ORDER — AMLODIPINE BESYLATE 5 MG PO TABS
10.0000 mg | ORAL_TABLET | Freq: Every day | ORAL | Status: DC
  Administered 2016-05-08 – 2016-05-12 (×5): 10 mg via ORAL
  Filled 2016-05-07 (×5): qty 2

## 2016-05-07 MED ORDER — HALOPERIDOL LACTATE 5 MG/ML IJ SOLN
INTRAMUSCULAR | Status: AC
  Filled 2016-05-07: qty 1

## 2016-05-07 MED ORDER — TEMAZEPAM 15 MG PO CAPS
15.0000 mg | ORAL_CAPSULE | Freq: Every day | ORAL | Status: DC
  Administered 2016-05-07 – 2016-05-10 (×4): 15 mg via ORAL
  Filled 2016-05-07 (×4): qty 1

## 2016-05-07 MED ORDER — HALOPERIDOL 5 MG PO TABS
10.0000 mg | ORAL_TABLET | Freq: Every day | ORAL | Status: DC
  Administered 2016-05-07 – 2016-05-11 (×5): 10 mg via ORAL
  Filled 2016-05-07 (×5): qty 2

## 2016-05-07 MED ORDER — ACETAMINOPHEN 325 MG PO TABS
650.0000 mg | ORAL_TABLET | Freq: Four times a day (QID) | ORAL | Status: DC | PRN
  Administered 2016-05-09 – 2016-05-11 (×3): 650 mg via ORAL
  Filled 2016-05-07 (×3): qty 2

## 2016-05-07 MED ORDER — ALUM & MAG HYDROXIDE-SIMETH 200-200-20 MG/5ML PO SUSP: 30.0000 mL | ORAL | Status: DC | PRN

## 2016-05-07 MED ORDER — CLINDAMYCIN HCL 150 MG PO CAPS
150.0000 mg | ORAL_CAPSULE | Freq: Four times a day (QID) | ORAL | Status: DC
  Administered 2016-05-07 – 2016-05-12 (×19): 150 mg via ORAL
  Filled 2016-05-07 (×20): qty 1

## 2016-05-07 MED ORDER — AMLODIPINE BESYLATE 5 MG PO TABS
10.0000 mg | ORAL_TABLET | Freq: Every day | ORAL | Status: DC
  Administered 2016-05-07: 10 mg via ORAL

## 2016-05-07 MED ORDER — TEMAZEPAM 7.5 MG PO CAPS: 15.0000 mg | ORAL_CAPSULE | Freq: Every day | ORAL | Status: DC

## 2016-05-07 NOTE — ED Provider Notes (Signed)
[facility] Emergency Department Provider Note   ____________________________________________  Time seen: Approximately 11:58 AM  I have reviewed the triage vital signs and the nursing notes.   HISTORY  Chief Complaint Psychiatric Evaluation    HPI Walter Hudnall. is a 35 y.o. male who had IVC papers taken out by family because he is schizophrenic, off his medications, and has not been eating, sleeping, bathing properly in the past 2 months. Says that they were talking about snakes crawling on his stomach. They're also very concerned for his safety. On arrival to the ER patient was also complaining of a rash in his perianal area that has been progressively worsening over the past couple months. Patient is a poor historian and denies all these allegations.   Past Medical History  Diagnosis Date  . Gout   . Hypertension   . Schizophrenia [facility])     Patient Active Problem List   Diagnosis Date Noted  . HTN (hypertension) 01/21/2016  . Cannabis use disorder, moderate, dependence (HCC) 01/21/2016  . Tobacco use disorder 01/21/2016  . Gout 01/21/2016  . UTI (urinary tract infection) 01/21/2016  . Hidradenitis 01/21/2016  . Schizophrenia, paranoid, chronic (HCC) 01/20/2016    History reviewed. No pertinent past surgical history.  Current Outpatient Rx  Name  Route  Sig  Dispense  Refill  . amLODipine-benazepril (LOTREL) 10-40 MG capsule   Oral   Take 1 capsule by mouth daily.   30 capsule   0   . clindamycin (CLEOCIN) 300 MG capsule   Oral   Take 1 capsule (300 mg total) by mouth 3 (three) times daily.   30 capsule   0   . colchicine 0.6 MG tablet      1 tablet by mouth daily until gout pain is gone.   15 tablet   0   . haloperidol (HALDOL) 10 MG tablet   Oral   Take 1 tablet (10 mg total) by mouth at bedtime.   30 tablet   0   . haloperidol decanoate (HALDOL DECANOATE) 100 MG/ML injection   Intramuscular   Inject 1 mL (100  mg total) into the muscle every 30 (thirty) days.   1 mL   1   . indomethacin (INDOCIN) 50 MG capsule   Oral   Take 1 capsule (50 mg total) by mouth 2 (two) times daily with a meal.   30 capsule   0   . oxyCODONE-acetaminophen (ROXICET) 5-325 MG tablet   Oral   Take 1-2 tablets by mouth every 4 (four) hours as needed for severe pain.   15 tablet   0   . temazepam (RESTORIL) 15 MG capsule   Oral   Take 1 capsule (15 mg total) by mouth at bedtime.   30 capsule   0     Allergies Bactrim and Lisinopril  History reviewed. No pertinent family history.  Social History Social History  Substance Use Topics  . Smoking status: Current Every Day Smoker -- 1.00 packs/day    Types: Cigarettes  . Smokeless tobacco: Never Used  . Alcohol Use: Yes    Review of Systems Constitutional: No fever/chills Eyes: No visual changes. ENT: No sore throat. Cardiovascular: Denies chest pain. Respiratory: Denies shortness of breath. Gastrointestinal: No abdominal pain.  No nausea, no vomiting.  No diarrhea.  No constipation. Genitourinary: Negative for dysuria. Musculoskeletal: Negative for back pain. Skin: Patient also complaining of rash in his groin area Neurological: Negative for headaches, focal weakness  or numbness. Psychological: Patient denies all IVC allegations and is obviously psychotic on arrival. 10-point ROS otherwise negative.  ____________________________________________   PHYSICAL EXAM:  VITAL SIGNS: ED Triage Vitals  Enc Vitals Group     BP 05/07/16 1054 133/85 mmHg     Pulse Rate 05/07/16 1054 73     Resp 05/07/16 1054 20     Temp 05/07/16 1054 99.3 F (37.4 C)     Temp Source 05/07/16 1054 Oral     SpO2 05/07/16 1054 99 %     Weight 05/07/16 1054 237 lb (107.502 kg)     Height 05/07/16 1054 5\' 7"  (1.702 m)     Head Cir --      Peak Flow --      Pain Score 05/07/16 1054 0     Pain Loc --      Pain Edu? --      Excl. in GC? --     Constitutional: Alert  and oriented. Well appearing and in no acute distress. Eyes: Conjunctivae are normal. PERRL. EOMI. Head: Atraumatic. Nose: No congestion/rhinnorhea. Mouth/Throat: Mucous membranes are moist.  Oropharynx non-erythematous. Neck: No stridor.   Cardiovascular: Normal rate, regular rhythm. Grossly normal heart sounds.  Good peripheral circulation. Respiratory: Normal respiratory effort.  No retractions. Lungs CTAB. Gastrointestinal: Soft and nontender. No distention. No abdominal bruits. No CVA tenderness. Musculoskeletal: No lower extremity tenderness nor edema.  No joint effusions. Neurologic:  Normal speech and language. No gross focal neurologic deficits are appreciated. No gait instability. Skin:  Skin is warm, dry and intact. Patient with severe folliculitis to his bilateral thighs and groin area with an intermix of yeast infection and odor.Marland Kitchen Psychiatric: Mood and affect are Bizarre and patient denies all allegations.  ____________________________________________   LABS (all labs ordered are listed, but only abnormal results are displayed)  Labs Reviewed  COMPREHENSIVE METABOLIC PANEL - Abnormal; Notable for the following:    Glucose, Bld 118 (*)    Total Protein 9.4 (*)    All other components within normal limits  ACETAMINOPHEN LEVEL - Abnormal; Notable for the following:    Acetaminophen (Tylenol), Serum <10 (*)    All other components within normal limits  CBC - Abnormal; Notable for the following:    RDW 15.9 (*)    All other components within normal limits  ETHANOL  SALICYLATE LEVEL  URINE DRUG SCREEN, QUALITATIVE ([facility] ONLY)   ____________________________________________  EKG   ____________________________________________  RADIOLOGY No results found.   ____________________________________________   PROCEDURES  Procedure(s) performed: None  Critical Care performed: No  ____________________________________________   INITIAL IMPRESSION / ASSESSMENT AND  PLAN / ED COURSE  Pertinent labs & imaging results that were available during my care of the patient were reviewed by me and considered in my medical decision making (see chart for details). 1:41 PM Patient will be placed under IVC in the emergency department. Patient was started on clindamycin by mouth and nystatin powder for his folliculitis and yeast infection in his groin. Are awaiting routine labs and we'll consult TTS and psychiatry.  1:41 PM Patient will be signed out to Dr. Derrill Kay at 4 PM. ____________________________________________   FINAL CLINICAL IMPRESSION(S) / ED DIAGNOSES  Final diagnoses:  Schizophrenia, unspecified type (HCC)  Psychosis, unspecified psychosis type  Noncompliance with medication regimen  Deep folliculitis  Candidiasis of scrotum      NEW MEDICATIONS STARTED DURING THIS VISIT:  New Prescriptions   No medications on file     Note:  This  document was prepared using Conservation officer, historic buildings and may include unintentional dictation errors.    Leona Carry, MD 05/07/16 1341

## 2016-05-07 NOTE — Consult Note (Signed)
[facility] Face-to-Face Psychiatry Consult   Reason for Consult:  Consult for 35 year old man with schizophrenia. Sent here from his group home with worsening psychosis. Referring Physician:  Archie Balboa Patient Identification: Walter Pena. MRN:  [id number] Principal Diagnosis: Schizophrenia, paranoid, chronic ([place]) Diagnosis:   Patient Active Problem List   Diagnosis Date Noted  . Noncompliance [Z91.19] 05/07/2016  . HTN (hypertension) [I10] 01/21/2016  . Cannabis use disorder, moderate, dependence ([place]) [F12.20] 01/21/2016  . Tobacco use disorder [F17.200] 01/21/2016  . Gout [M10.9] 01/21/2016  . UTI (urinary tract infection) [N39.0] 01/21/2016  . Hidradenitis [L73.2] 01/21/2016  . Schizophrenia, paranoid, chronic ([place]) [F20.0] 01/20/2016    Total Time spent with patient: 1 hour  Subjective:   Walter Pena. is a 35 y.o. male patient admitted with "I'm not eating much since my mother died".  HPI:  Patient interviewed. Chart reviewed. Labs and vitals reviewed. Patient was a difficult historian. He made eye contact but spoke very little and a lot of what he said was in whispers. He was sent here from his group home with petitions that said that he has been refusing medicine and refusing to eat and refusing any activity for some time now. Patient on interview said that he is not doing much and has lost any interest since his mother died. I tried to follow-up about when his mother died but would not give me any more information. He admits that he is noncompliant with medicine and that he has not been eating. He denies wish to die but seems to be disorganized in his thinking and uncooperative. He was not aggressive or violent. He denies that he's been using any alcohol or drugs recently. He denies having any new physical complaints. Unclear whether he has been still receiving his long-acting injections.  Social history: Patient has been residing in a group home. Sounds like he's been doing  reasonably well for a while since his last discharge from here. The patient is claiming that his mother passed away. If so this would be clearly upsetting for him particularly since his father passed away not too long ago as well. Patient denies that he has any other family living that he is in touch with.  Substance abuse history: Patient has a history of abuse of cannabis and at times other drugs in the past when he is not in the hospital. He denies that he's recently been using.  Medical history: Hypertension history. He has a history of severe hidradenitis with infections in the groin. History of gout. History of recurrent urinary tract infection.  Past Psychiatric History: Long history of schizophrenia. Multiple hospitalizations. History of noncompliance. Denies any suicide attempts. Denies any violence. Most recently had been put on Haldol decanoate and discharged to a new living environment.  Risk to Self: Is patient at risk for suicide?: No, but patient needs Medical Clearance Risk to Others:   Prior Inpatient Therapy:   Prior Outpatient Therapy:    Past Medical History:  Past Medical History  Diagnosis Date  . Gout   . Hypertension   . Schizophrenia ([place])    History reviewed. No pertinent past surgical history. Family History: History reviewed. No pertinent family history. Family Psychiatric  History: Patient does not answer and there is not a known family history Social History:  History  Alcohol Use  . Yes     History  Drug Use No    Social History   Social History  . Marital Status: Single  Spouse Name: N/A  . Number of Children: N/A  . Years of Education: N/A   Social History Main Topics  . Smoking status: Current Every Day Smoker -- 1.00 packs/day    Types: Cigarettes  . Smokeless tobacco: Never Used  . Alcohol Use: Yes  . Drug Use: No  . Sexual Activity: Not Asked   Other Topics Concern  . None   Social History Narrative   Additional Social  History:    Allergies:   Allergies  Allergen Reactions  . Bactrim [Sulfamethoxazole-Trimethoprim] Nausea And Vomiting  . Lisinopril Swelling    Labs:  Results for orders placed or performed during the hospital encounter of 05/07/16 (from the past 48 hour(s))  Comprehensive metabolic panel     Status: Abnormal   Collection Time: 05/07/16 11:05 AM  Result Value Ref Range   Sodium 138 135 - 145 mmol/L   Potassium 3.7 3.5 - 5.1 mmol/L   Chloride 101 101 - 111 mmol/L   CO2 29 22 - 32 mmol/L   Glucose, Bld 118 (H) 65 - 99 mg/dL   BUN 8 6 - 20 mg/dL   Creatinine, Ser 1.10 0.61 - 1.24 mg/dL   Calcium 9.2 8.9 - 10.3 mg/dL   Total Protein 9.4 (H) 6.5 - 8.1 g/dL   Albumin 4.3 3.5 - 5.0 g/dL   AST 34 15 - 41 U/L   ALT 36 17 - 63 U/L   Alkaline Phosphatase 64 38 - 126 U/L   Total Bilirubin 0.8 0.3 - 1.2 mg/dL   GFR calc non Af Amer >60 >60 mL/min   GFR calc Af Amer >60 >60 mL/min    Comment: (NOTE) The eGFR has been calculated using the CKD EPI equation. This calculation has not been validated in all clinical situations. eGFR's persistently <60 mL/min signify possible Chronic Kidney Disease.    Anion gap 8 5 - 15  Ethanol     Status: None   Collection Time: 05/07/16 11:05 AM  Result Value Ref Range   Alcohol, Ethyl (B) <5 <5 mg/dL    Comment:        LOWEST DETECTABLE LIMIT FOR SERUM ALCOHOL IS 5 mg/dL FOR MEDICAL PURPOSES ONLY   Salicylate level     Status: None   Collection Time: 05/07/16 11:05 AM  Result Value Ref Range   Salicylate Lvl <9.5 2.8 - 30.0 mg/dL  Acetaminophen level     Status: Abnormal   Collection Time: 05/07/16 11:05 AM  Result Value Ref Range   Acetaminophen (Tylenol), Serum <10 (L) 10 - 30 ug/mL    Comment:        THERAPEUTIC CONCENTRATIONS VARY SIGNIFICANTLY. A RANGE OF 10-30 ug/mL MAY BE AN EFFECTIVE CONCENTRATION FOR MANY PATIENTS. HOWEVER, SOME ARE BEST TREATED AT CONCENTRATIONS OUTSIDE THIS RANGE. ACETAMINOPHEN CONCENTRATIONS >150 ug/mL AT 4  HOURS AFTER INGESTION AND >50 ug/mL AT 12 HOURS AFTER INGESTION ARE OFTEN ASSOCIATED WITH TOXIC REACTIONS.   cbc     Status: Abnormal   Collection Time: 05/07/16 11:05 AM  Result Value Ref Range   WBC 8.6 3.8 - 10.6 K/uL   RBC 4.78 4.40 - 5.90 MIL/uL   Hemoglobin 14.8 13.0 - 18.0 g/dL   HCT 42.2 40.0 - 52.0 %   MCV 88.3 80.0 - 100.0 fL   MCH 30.9 26.0 - 34.0 pg   MCHC 35.0 32.0 - 36.0 g/dL   RDW 15.9 (H) 11.5 - 14.5 %   Platelets 286 150 - 440 K/uL    Current Facility-Administered  Medications  Medication Dose Route Frequency Provider Last Rate Last Dose  . amLODipine (NORVASC) tablet 10 mg  10 mg Oral Daily Audery Amel, MD      . benazepril (LOTENSIN) tablet 40 mg  40 mg Oral Daily Kiyoto Slomski T Elfie Costanza, MD      . clindamycin (CLEOCIN) capsule 150 mg  150 mg Oral Q6H Leona Carry, MD   150 mg at 05/07/16 1423  . haloperidol (HALDOL) tablet 10 mg  10 mg Oral QHS Audery Amel, MD      . haloperidol decanoate (HALDOL DECANOATE) 100 MG/ML injection 100 mg  100 mg Intramuscular STAT Kitty Cadavid T Kerryn Tennant, MD      . temazepam (RESTORIL) capsule 15 mg  15 mg Oral QHS Audery Amel, MD       Current Outpatient Prescriptions  Medication Sig Dispense Refill  . amLODipine-benazepril (LOTREL) 10-40 MG capsule Take 1 capsule by mouth daily. 30 capsule 0  . clindamycin (CLEOCIN) 300 MG capsule Take 1 capsule (300 mg total) by mouth 3 (three) times daily. 30 capsule 0  . colchicine 0.6 MG tablet 1 tablet by mouth daily until gout pain is gone. 15 tablet 0  . haloperidol (HALDOL) 10 MG tablet Take 1 tablet (10 mg total) by mouth at bedtime. 30 tablet 0  . haloperidol decanoate (HALDOL DECANOATE) 100 MG/ML injection Inject 1 mL (100 mg total) into the muscle every 30 (thirty) days. 1 mL 1  . indomethacin (INDOCIN) 50 MG capsule Take 1 capsule (50 mg total) by mouth 2 (two) times daily with a meal. 30 capsule 0  . oxyCODONE-acetaminophen (ROXICET) 5-325 MG tablet Take 1-2 tablets by mouth every 4 (four)  hours as needed for severe pain. 15 tablet 0  . temazepam (RESTORIL) 15 MG capsule Take 1 capsule (15 mg total) by mouth at bedtime. 30 capsule 0    Musculoskeletal: Strength & Muscle Tone: within normal limits Gait & Station: normal Patient leans: N/A  Psychiatric Specialty Exam: Physical Exam  Nursing note and vitals reviewed. Constitutional: He appears well-developed and well-nourished.  HENT:  Head: Normocephalic and atraumatic.  Eyes: Conjunctivae are normal. Pupils are equal, round, and reactive to light.  Neck: Normal range of motion.  Cardiovascular: Regular rhythm and normal heart sounds.   Respiratory: Effort normal. No respiratory distress.  GI: Soft.  Musculoskeletal: Normal range of motion.  Neurological: He is alert.  Skin: Skin is warm and dry.  Psychiatric: His affect is blunt and inappropriate. His speech is delayed. He is slowed. Thought content is paranoid. Cognition and memory are impaired. He expresses inappropriate judgment.    Review of Systems  Constitutional: Negative.   HENT: Negative.   Eyes: Negative.   Respiratory: Negative.   Cardiovascular: Negative.   Gastrointestinal: Negative.   Musculoskeletal: Negative.   Skin: Negative.   Neurological: Negative.   Psychiatric/Behavioral: Positive for depression and memory loss. Negative for suicidal ideas, hallucinations and substance abuse. The patient has insomnia. The patient is not nervous/anxious.     Blood pressure 130/75, pulse 76, temperature 99.1 F (37.3 C), temperature source Oral, resp. rate 18, height 5\' 7"  (1.702 m), weight 107.502 kg (237 lb), SpO2 99 %.Body mass index is 37.11 kg/(m^2).  General Appearance: Disheveled  Eye Contact:  Fair  Speech:  Blocked and Slow  Volume:  Decreased  Mood:  Depressed  Affect:  Flat  Thought Process:  Disorganized  Orientation:  Negative  Thought Content:  Negative  Suicidal Thoughts:  Denies but admits  that he is not eating or taking care of himself   Homicidal Thoughts:  No  Memory:  Immediate;   Good Recent;   Poor Remote;   Poor  Judgement:  Impaired  Insight:  Shallow  Psychomotor Activity:  Decreased  Concentration:  Concentration: Poor  Recall:  Poor  Fund of Knowledge:  Fair  Language:  Poor  Akathisia:  No  Handed:  Right  AIMS (if indicated):     Assets:  Housing Resilience  ADL's:  Impaired  Cognition:  Impaired,  Mild  Sleep:        Treatment Plan Summary: Daily contact with patient to assess and evaluate symptoms and progress in treatment, Medication management and Plan I am ordering a Haldol decanoate shot of 100 mg to be given now. We will then restart him on his Haldol at night as well as blood pressure medicine and Restoril. Patient needs hospital level treatment for stabilization and safety. Admission orders done to admit him to the hospital downstairs. Patient informed of the plan.  Disposition: Recommend psychiatric Inpatient admission when medically cleared. Supportive therapy provided about ongoing stressors.  Alethia Berthold, MD 05/07/2016 3:54 PM

## 2016-05-07 NOTE — ED Provider Notes (Signed)
-----------------------------------------   8:42 PM on 05/07/2016 -----------------------------------------  Patient medically clear for psychiatric admission  Phineas Semen, MD 05/07/16 2042

## 2016-05-07 NOTE — ED Notes (Addendum)
Pt to ed with police who report IVC papers are on the way to the hospital.  Per officer pt has not been taking his meds.  Pt denies SI, Denies HI, denies hallucinations.  Pt states he is taking his meds and he does not know why she would take the IVC papers out on him.

## 2016-05-07 NOTE — ED Notes (Signed)
Pt informed we need a Urine specimen.

## 2016-05-08 DIAGNOSIS — F201 Disorganized schizophrenia: Principal | ICD-10-CM

## 2016-05-08 LAB — HEMOGLOBIN A1C: Hgb A1c MFr Bld: 6.2 % — ABNORMAL HIGH (ref 4.0–6.0)

## 2016-05-08 LAB — LIPID PANEL
Cholesterol: 111 mg/dL (ref 0–200)
HDL: 24 mg/dL — ABNORMAL LOW (ref >40)
LDL CALC: 68 mg/dL (ref 0–99)
TRIGLYCERIDES: 97 mg/dL (ref <150)
Total CHOL/HDL Ratio: 4.6 RATIO
VLDL: 19 mg/dL (ref 0–40)

## 2016-05-08 LAB — GLUCOSE, CAPILLARY: Glucose-Capillary: 124 mg/dL — ABNORMAL HIGH (ref 65–99)

## 2016-05-08 LAB — TSH: TSH: 0.439 u[IU]/mL (ref 0.350–4.500)

## 2016-05-08 NOTE — Progress Notes (Signed)
Recreation Therapy Notes  Date: 06.22.17 Time: 9:30 am Location: Craft Room  Group Topic: Leisure Education  Goal Area(s) Addresses:  Patient will identify things they are grateful for. Patient will be educated on why it is important to be grateful.  Behavioral Response: Did not attend  Intervention: Grateful Wheel  Activity: Patients were given an I Am Grateful For worksheet and instructed to write things they were grateful for under each category.  Education: LRT educated patients on why it is important to be grateful.  Education Outcome: Patient did not attend group.  Clinical Observations/Feedback: Patient did not attend group.  Jacquelynn Cree, LRT/CTRS 05/08/2016 10:21 AM

## 2016-05-08 NOTE — [facility] Suicide Risk Assessment (Signed)
[facility] Admission Suicide Risk Assessment   Nursing information obtained from:  Patient Demographic factors:  Male, Low socioeconomic status, Unemployed Current Mental Status:  NA (denies) Loss Factors:  Loss of significant relationship Historical Factors:  NA Risk Reduction Factors:  Living with another person, especially a relative, Positive social support  Total Time spent with patient: 1 hour Principal Problem: Schizophrenia (HCC) Diagnosis:   Patient Active Problem List   Diagnosis Date Noted  . Noncompliance [Z91.19] 05/07/2016  . Schizophrenia (HCC) [F20.9] 05/07/2016  . HTN (hypertension) [I10] 01/21/2016  . Cannabis use disorder, moderate, dependence (HCC) [F12.20] 01/21/2016  . Tobacco use disorder [F17.200] 01/21/2016  . Gout [M10.9] 01/21/2016  . UTI (urinary tract infection) [N39.0] 01/21/2016  . Hidradenitis [L73.2] 01/21/2016  . Schizophrenia, paranoid, chronic (HCC) [F20.0] 01/20/2016   Subjective Data: This is a 35 year old man with a history of schizophrenia. Brought into the hospital with decreased oral intake, decreased activity, isolation and withdrawal and noncompliance with his medication. Patient is denying any suicidal ideation but reports that he is very depressed. He does not have a known past history of suicidal behavior  Continued Clinical Symptoms:  Alcohol Use Disorder Identification Test Final Score (AUDIT): 0 The "Alcohol Use Disorders Identification Test", Guidelines for Use in Primary Care, Second Edition.  World Science writer [facility]). Score between 0-7:  no or low risk or alcohol related problems. Score between 8-15:  moderate risk of alcohol related problems. Score between 16-19:  high risk of alcohol related problems. Score 20 or above:  warrants further diagnostic evaluation for alcohol dependence and treatment.   CLINICAL FACTORS:   Schizophrenia:   Depressive state   Musculoskeletal: Strength & Muscle Tone: within normal limits Gait &  Station: normal Patient leans: N/A  Psychiatric Specialty Exam: Physical Exam  Constitutional: He appears well-developed and well-nourished.  HENT:  Head: Normocephalic and atraumatic.  Eyes: Conjunctivae are normal. Pupils are equal, round, and reactive to light.  Neck: Normal range of motion.  Cardiovascular: Normal heart sounds.   Respiratory: Effort normal.  GI: Soft.  Musculoskeletal: Normal range of motion.  Neurological: He is alert.  Skin: Skin is warm and dry.     Psychiatric: His affect is blunt. His speech is delayed. He is slowed and withdrawn. Thought content is paranoid. Cognition and memory are impaired. He expresses impulsivity.    Review of Systems  Constitutional: Negative.   HENT: Negative.   Eyes: Negative.   Respiratory: Negative.   Cardiovascular: Negative.   Gastrointestinal: Negative.   Musculoskeletal: Negative.   Skin: Negative.   Neurological: Negative.   Psychiatric/Behavioral: Positive for memory loss. Negative for depression, suicidal ideas, hallucinations and substance abuse. The patient is nervous/anxious. The patient does not have insomnia.     Blood pressure 124/97, pulse 74, temperature 98.5 F (36.9 C), temperature source Oral, resp. rate 18, height 5' 9.69" (1.77 m), weight 106.142 kg (234 lb), SpO2 100 %.Body mass index is 33.88 kg/(m^2).  General Appearance: Disheveled  Eye Contact:  Minimal  Speech:  Garbled and Slow  Volume:  Decreased  Mood:  Dysphoric  Affect:  Constricted  Thought Process:  Irrelevant  Orientation:  Negative  Thought Content:  Illogical  Suicidal Thoughts:  No  Homicidal Thoughts:  No  Memory:  Immediate;   Fair Recent;   Fair Remote;   Fair  Judgement:  Impaired  Insight:  Lacking  Psychomotor Activity:  Decreased  Concentration:  Concentration: Poor  Recall:  [employer] of Knowledge:  Fair  Language:  Fair  Akathisia:  No  Handed:  Right  AIMS (if indicated):     Assets:  Financial  Resources/Insurance Social Support  ADL's:  Intact  Cognition:  Impaired,  Mild  Sleep:  Number of Hours: 7.75      COGNITIVE FEATURES THAT CONTRIBUTE TO RISK:  Closed-mindedness    SUICIDE RISK:   Mild:  Suicidal ideation of limited frequency, intensity, duration, and specificity.  There are no identifiable plans, no associated intent, mild dysphoria and related symptoms, good self-control (both objective and subjective assessment), few other risk factors, and identifiable protective factors, including available and accessible social support.  PLAN OF CARE: Patient is admitted to the psychiatric service. Restart medication including his injectable antipsychotic. Daily individual and group psychotherapy and monitoring.  I certify that inpatient services furnished can reasonably be expected to improve the patient's condition.   Mordecai Rasmussen, MD 05/08/2016, 8:36 PM

## 2016-05-08 NOTE — Progress Notes (Signed)
Patient seclusive to his room.  Up for medications and meals.  No group attendance.  Not forthcoming with information.  Will answer yes and not questions.  Support and encouragement offered.  Safety maintained.  Performed AD's this am.

## 2016-05-08 NOTE — Plan of Care (Signed)
Problem: Self-Care: Goal: Ability to participate in self-care as condition permits will improve Outcome: Not Progressing Pt is malodorous and it's reported pt has had inadequate hygiene prior to admission. Pt refused shower this evening, after strong encouragement.

## 2016-05-08 NOTE — [facility] Group Notes (Signed)
[facility] Group Notes:  (Nursing/MHT/Case Management/Adjunct)  Date:  05/08/2016  Time:  3:59 PM  Type of Therapy:  Psychoeducational Skills  Participation Level:  None  Participation Quality:  Inattentive  Affect:  Flat  Cognitive:  Lacking  Insight:  Lacking  Engagement in Group:  None  Modes of Intervention:  Activity, Discussion, Education and Socialization  Summary of Progress/Problems:  Walter Pena Walter Pena 05/08/2016, 3:59 PM

## 2016-05-08 NOTE — Tx Team (Addendum)
Initial Interdisciplinary Treatment Plan   PATIENT STRESSORS: Loss of father recently   PATIENT STRENGTHS: Physical Health Supportive family/friends   PROBLEM LIST: Problem List/Patient Goals Date to be addressed Date deferred Reason deferred Estimated date of resolution  Psychosis 05/08/16     Folliculitis in groin region 05/08/16     "Getting dressed" 05/08/16     "Stop smoking" 05/08/16                                    DISCHARGE CRITERIA:  Improved stabilization in mood, thinking, and/or behavior Medical problems require only outpatient monitoring  PRELIMINARY DISCHARGE PLAN: Outpatient therapy  PATIENT/FAMIILY INVOLVEMENT: This treatment plan has been presented to and reviewed with the patient, Walter Pena..  The patient and family have been given the opportunity to ask questions and make suggestions.  Jonette Mate Kasandra Fehr 05/08/2016, 3:37 AM

## 2016-05-08 NOTE — Plan of Care (Signed)
Problem: Coping: Goal: Ability to verbalize frustrations and anger appropriately will improve Outcome: Not Progressing Talks little, no group attendance

## 2016-05-08 NOTE — Progress Notes (Signed)
CSW attempted to complete a PSA but the pt was too acute.  Sanjna Haskew F. Tashema Tiller, LCSWA, LCAS  05/08/16 

## 2016-05-08 NOTE — [facility] Group Notes (Signed)
[facility] LCSW Group Therapy   05/08/2016 1pm  Type of Therapy: Group Therapy   Participation Level: Did Not Attend. Patient invited to participate but declined.    Biviana Saddler F. Magin Balbi, MSW, LCSWA, LCAS   

## 2016-05-08 NOTE — H&P (Signed)
Psychiatric Admission Assessment Adult  Patient Identification: Walter Pena. MRN:  [id number] Date of Evaluation:  05/08/2016 Chief Complaint:  schizophrenia, paranoid Principal Diagnosis: Schizophrenia (HCC) Diagnosis:   Patient Active Problem List   Diagnosis Date Noted  . Noncompliance [Z91.19] 05/07/2016  . Schizophrenia (HCC) [F20.9] 05/07/2016  . HTN (hypertension) [I10] 01/21/2016  . Cannabis use disorder, moderate, dependence (HCC) [F12.20] 01/21/2016  . Tobacco use disorder [F17.200] 01/21/2016  . Gout [M10.9] 01/21/2016  . UTI (urinary tract infection) [N39.0] 01/21/2016  . Hidradenitis [L73.2] 01/21/2016  . 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 Patient is not a very good historian even today. Reportedly he has been off his medicine probably for more than a month. He has been refusing to eat. Refusing to interact. Not washing or changing his clothes. Not getting out of bed. On interview the patient initially did say he was depressed but he would not elaborate. He indicated that some family members had died recently although it's hard corroborate this with his limited interaction. He has so far been cooperative with restarting medicine. Patient was complaining of having a return of infection in his groin which is confirmed. He denies suicidal intention. Denies that he's been abusing any drugs. Associated Signs/Symptoms: Depression Symptoms:  depressed mood, anhedonia, hypersomnia, psychomotor retardation, (Hypo) Manic Symptoms:  Delusions, Anxiety Symptoms:  Excessive Worry, Psychotic Symptoms:  Hallucinations: Auditory PTSD Symptoms: Negative Total Time spent with patient: 1 hour  Past Psychiatric History: Patient has a long history of schizophrenia. He has had a lot of trouble with noncompliance over the years. When he is on his medicine he will  usually do well but he is frequently noncompliant. He has been apparently doing a little better on injectable medicine although now he is probably off of that as well. When he is not on his medicine he often becomes very withdrawn with poor self-care. I don't think we have a clear past history of suicide attempts no known history of violence.  Is the patient at risk to self? Yes.    Has the patient been a risk to self in the past 6 months? Yes.    Has the patient been a risk to self within the distant past? Yes.    Is the patient a risk to others? No.  Has the patient been a risk to others in the past 6 months? No.  Has the patient been a risk to others within the distant past? No.   Prior Inpatient Therapy:  patient has had multiple prior hospitalizations. Prior Outpatient Therapy:  currently has outpatient treatment in place when he is compliant  Alcohol Screening: 1. How often do you have a drink containing alcohol?: Never 9. Have you or someone else been injured as a result of your drinking?: No 10. Has a relative or friend or a doctor or another health worker been concerned about your drinking or suggested you cut down?: No Alcohol Use Disorder Identification Test Final Score (AUDIT): 0 Brief Intervention: AUDIT score less than 7 or less-screening does not suggest unhealthy drinking-brief intervention not indicated Substance Abuse History in the last 12 months:  No. Consequences of Substance Abuse: Negative Previous Psychotropic Medications: Yes  Psychological Evaluations: Yes  Past Medical History:  Past Medical History  Diagnosis Date  . Gout   . Hypertension   . Schizophrenia (HCC)    History reviewed. No pertinent past surgical history.  Family History: History reviewed. No pertinent family history. Family Psychiatric  History: Patient is not able to answer this question we do not know of any family history Tobacco Screening: ([phone])::1)@ Social History:  History    Alcohol Use  . Yes     History  Drug Use No    Additional Social History:                           Allergies:   Allergies  Allergen Reactions  . Bactrim [Sulfamethoxazole-Trimethoprim] Nausea And Vomiting  . Lisinopril Swelling   Lab Results:  Results for orders placed or performed during the hospital encounter of 05/07/16 (from the past 48 hour(s))  Glucose, capillary     Status: Abnormal   Collection Time: 05/08/16  6:41 AM  Result Value Ref Range   Glucose-Capillary 124 (H) 65 - 99 mg/dL   Comment 1 Notify RN   Hemoglobin A1c     Status: Abnormal   Collection Time: 05/08/16  7:07 AM  Result Value Ref Range   Hgb A1c MFr Bld 6.2 (H) 4.0 - 6.0 %  Lipid panel     Status: Abnormal   Collection Time: 05/08/16  7:07 AM  Result Value Ref Range   Cholesterol 111 0 - 200 mg/dL   Triglycerides 97 <161 mg/dL   HDL 24 (L) >09 mg/dL   Total CHOL/HDL Ratio 4.6 RATIO   VLDL 19 0 - 40 mg/dL   LDL Cholesterol 68 0 - 99 mg/dL    Comment:        Total Cholesterol/HDL:CHD Risk Coronary Heart Disease Risk Table                     Men   Women  1/2 Average Risk   3.4   3.3  Average Risk       5.0   4.4  2 X Average Risk   9.6   7.1  3 X Average Risk  23.4   11.0        Use the calculated Patient Ratio above and the CHD Risk Table to determine the patient's CHD Risk.        ATP III CLASSIFICATION (LDL):  <100     mg/dL   Optimal  604-540  mg/dL   Near or Above                    Optimal  130-159  mg/dL   Borderline  981-191  mg/dL   High  >478     mg/dL   Very High   TSH     Status: None   Collection Time: 05/08/16  7:07 AM  Result Value Ref Range   TSH 0.439 0.350 - 4.500 uIU/mL    Blood Alcohol level:  Lab Results  Component Value Date   ETH <5 05/07/2016   ETH <5 01/19/2016    Metabolic Disorder Labs:  Lab Results  Component Value Date   HGBA1C 6.2* 05/08/2016   Lab Results  Component Value Date   PROLACTIN 6.9 01/19/2016   Lab Results   Component Value Date   CHOL 111 05/08/2016   TRIG 97 05/08/2016   HDL 24* 05/08/2016   CHOLHDL 4.6 05/08/2016   VLDL 19 05/08/2016   LDLCALC 68 05/08/2016   LDLCALC 82 01/23/2016    Current Medications: Current Facility-Administered Medications  Medication Dose Route Frequency Provider Last Rate Last Dose  . acetaminophen (TYLENOL) tablet  650 mg  650 mg Oral Q6H PRN Audery Amel, MD      . alum & mag hydroxide-simeth (MAALOX/MYLANTA) 200-200-20 MG/5ML suspension 30 mL  30 mL Oral Q4H PRN Audery Amel, MD      . amLODipine (NORVASC) tablet 10 mg  10 mg Oral Daily Audery Amel, MD   10 mg at 05/08/16 1003  . benazepril (LOTENSIN) tablet 40 mg  40 mg Oral Daily Audery Amel, MD   40 mg at 05/08/16 1003  . clindamycin (CLEOCIN) capsule 150 mg  150 mg Oral Q6H Audery Amel, MD   150 mg at 05/08/16 1705  . haloperidol (HALDOL) tablet 10 mg  10 mg Oral QHS Audery Amel, MD   10 mg at 05/07/16 2304  . magnesium hydroxide (MILK OF MAGNESIA) suspension 30 mL  30 mL Oral Daily PRN Audery Amel, MD      . temazepam (RESTORIL) capsule 15 mg  15 mg Oral QHS Audery Amel, MD   15 mg at 05/07/16 2305   PTA Medications: Prescriptions prior to admission  Medication Sig Dispense Refill Last Dose  . amLODipine-benazepril (LOTREL) 10-40 MG capsule Take 1 capsule by mouth daily. 30 capsule 0   . clindamycin (CLEOCIN) 300 MG capsule Take 1 capsule (300 mg total) by mouth 3 (three) times daily. 30 capsule 0   . colchicine 0.6 MG tablet 1 tablet by mouth daily until gout pain is gone. 15 tablet 0   . haloperidol (HALDOL) 10 MG tablet Take 1 tablet (10 mg total) by mouth at bedtime. 30 tablet 0   . haloperidol decanoate (HALDOL DECANOATE) 100 MG/ML injection Inject 1 mL (100 mg total) into the muscle every 30 (thirty) days. 1 mL 1   . indomethacin (INDOCIN) 50 MG capsule Take 1 capsule (50 mg total) by mouth 2 (two) times daily with a meal. 30 capsule 0   . oxyCODONE-acetaminophen (ROXICET)  5-325 MG tablet Take 1-2 tablets by mouth every 4 (four) hours as needed for severe pain. 15 tablet 0   . temazepam (RESTORIL) 15 MG capsule Take 1 capsule (15 mg total) by mouth at bedtime. 30 capsule 0     Musculoskeletal: Strength & Muscle Tone: within normal limits Gait & Station: normal Patient leans: N/A  Psychiatric Specialty Exam: Physical Exam  Nursing note and vitals reviewed. Constitutional: He appears well-developed and well-nourished.  HENT:  Head: Normocephalic and atraumatic.  Eyes: Conjunctivae are normal. Pupils are equal, round, and reactive to light.  Neck: Normal range of motion.  Cardiovascular: Regular rhythm and normal heart sounds.   Respiratory: Effort normal. No respiratory distress.  GI: Soft.  Musculoskeletal: Normal range of motion.  Neurological: He is alert.  Skin: Skin is warm and dry.     Psychiatric: His affect is blunt. His speech is delayed. He is slowed. Thought content is paranoid. Cognition and memory are impaired. He expresses impulsivity.    Review of Systems  Constitutional: Negative.   HENT: Negative.   Eyes: Negative.   Respiratory: Negative.   Cardiovascular: Negative.   Gastrointestinal: Negative.   Musculoskeletal: Negative.   Skin: Negative.   Neurological: Negative.   Psychiatric/Behavioral: Negative for depression, suicidal ideas, hallucinations, memory loss and substance abuse. The patient is not nervous/anxious and does not have insomnia.     Blood pressure 124/97, pulse 74, temperature 98.5 F (36.9 C), temperature source Oral, resp. rate 18, height 5' 9.69" (1.77 m), weight 106.142 kg (234 lb), SpO2 100 %.  Body mass index is 33.88 kg/(m^2).  General Appearance: Disheveled  Eye Contact:  Minimal  Speech:  Slow  Volume:  Decreased  Mood:  Dysphoric  Affect:  Constricted  Thought Process:  Disorganized  Orientation:  Negative  Thought Content:  Illogical  Suicidal Thoughts:  No  Homicidal Thoughts:  No  Memory:   Immediate;   Fair Recent;   Fair Remote;   Fair  Judgement:  Impaired  Insight:  Shallow  Psychomotor Activity:  Decreased  Concentration:  Concentration: Poor  Recall:  [employer] of Knowledge:  Fair  Language:  Fair  Akathisia:  No  Handed:  Right  AIMS (if indicated):     Assets:  Housing Social Support  ADL's:  Intact  Cognition:  Impaired,  Mild  Sleep:  Number of Hours: 7.75       Treatment Plan Summary: Daily contact with patient to assess and evaluate symptoms and progress in treatment, Medication management and Plan Patient has been restarted on his Haldol both oral and injectable. He is also restarted on antidepressants on medicines for his blood pressure and on antibiotics for his groin infection. I will try to get follow-up evaluation of that to make sure we are getting the proper treatment for it. Patient will be encouraged to attend groups and activities daily. We will have daily reassessment in treatment team meetings. I am anticipating probably a length of stay in the range of 5-7 days.  Observation Level/Precautions:  15 minute checks  Laboratory:  HbAIC  Psychotherapy:  Daily individual and group psychotherapy and assessment   Medications:  Medication as above including haloperidol, long-acting injection and medicines for medical conditions   Consultations:  Recommend hospitalist consult   Discharge Concerns:  Making sure he has good outpatient treatment in place   Estimated LOS:5-7 days   Other:     I certify that inpatient services furnished can reasonably be expected to improve the patient's condition.    Mordecai Rasmussen, MD 6/22/20178:38 PM

## 2016-05-08 NOTE — Progress Notes (Signed)
No distress, no complaint, safety maintained.

## 2016-05-08 NOTE — Progress Notes (Signed)
D: Pt received from [facility]. Pt has two tattoos on bilateral inner arms. Pt has rash and abscesses to groin region. No contraband found. Pt has a very strong odor. Patient alert and oriented. Patient denies SI/HI/AVH. Pt affect is flat and depressed. Pt forwarded little and interacted minimally. Pt had to be prompted and encouraged to answer. Pt gave very little information, but indicated that he was here to "check on his last surgery." When writer tried to ask pt why he was on the behavioral health unit pt stated "they usually bring me here." Pt has no insight. Pt denies current stressor though previous note mentions a pt's father dying recently. When writer asked about this, pt indicated he did not know. Pt has no insight into current hospitalization. Pt's only complaint was "haven't slept in 6 days." Pt denied being depressed or anxious. Pt appears to be a poor historian, as there are multiple inconsistencies between what pt told Clinical research associate and previous notes. Pt claims he lives with cousin in a house in [place] A: Skin and contraband check performed with Sport and exercise psychologist. Reviewed admission material with pt. Educated pt on unit policy. Oriented pt to the unit. Offered active listening and support. Provided therapeutic communication. Administered scheduled medications. Strongly encouraged pt to bathe, especially with presence of rash by groin. R: Pt cooperative. Pt refused to bathe this evening, stating he was too tired and that he would fall if he tried. Pt agreed to shower in the morning. Pt medication compliant. Will continue Q15 min. checks. Safety maintained.

## 2016-05-08 NOTE — Tx Team (Signed)
Interdisciplinary Treatment Plan Update (Adult)        Date: 05/08/2016   Time Reviewed: 9:30 AM   Progress in Treatment: Improving  Attending groups: Continuing to assess, patient new to milieu  Participating in groups: Continuing to assess, patient new to milieu  Taking medication as prescribed: Yes  Tolerating medication: Yes  Family/Significant other contact made: No, CSW assessing for appropriate contacts  Patient understands diagnosis: Yes  Discussing patient identified problems/goals with staff: Yes  Medical problems stabilized or resolved: Yes  Denies suicidal/homicidal ideation: Yes  Issues/concerns per patient self-inventory: Yes  Other:   New problem(s) identified: N/A   Discharge Plan or Barriers: CSW continuing to assess, patient new to milieu.   Reason for Continuation of Hospitalization:   Depression   Anxiety   Medication Stabilization   Comments: N/A   Estimated length of stay: 3-5 days     Patient is a 35 y.o. male patient admitted with "I'm not eating much since my mother died".  HPI: Patient interviewed. Chart reviewed. Labs and vitals reviewed. Patient was a difficult historian. He made eye contact but spoke very little and a lot of what he said was in whispers. He was sent here from his group home with petitions that said that he has been refusing medicine and refusing to eat and refusing any activity for some time now. Patient on interview said that he is not doing much and has lost any interest since his mother died. I tried to follow-up about when his mother died but would not give me any more information. He admits that he is noncompliant with medicine and that he has not been eating. He denies wish to die but seems to be disorganized in his thinking and uncooperative. He was not aggressive or violent. He denies that he's been using any alcohol or drugs recently. He denies having any new physical complaints. Unclear whether he has been still receiving his  long-acting injections.  Social history: Patient has been residing in a group home. Sounds like he's been doing reasonably well for a while since his last discharge from here. The patient is claiming that his mother passed away. If so this would be clearly upsetting for him particularly since his father passed away not too long ago as well. Patient denies that he has any other family living that he is in touch with.  Substance abuse history: Patient has a history of abuse of cannabis and at times other drugs in the past when he is not in the hospital. He denies that he's recently been using.  Medical history: Hypertension history. He has a history of severe hidradenitis with infections in the groin. History of gout. History of recurrent urinary tract infection.  Past Psychiatric History: Long history of schizophrenia. Multiple hospitalizations. History of noncompliance. Denies any suicide attempts. Denies any violence. Most recently had been put on Haldol decanoate and discharged to a new living environment.   Patient lives in [place]. Patient will benefit from crisis stabilization, medication evaluation, group therapy, and psycho education in addition to case management for discharge planning. Patient and CSW reviewed pt's identified goals and treatment plan. Pt verbalized understanding and agreed to treatment plan.    Review of initial/current patient goals per problem list:  1. Goal(s): Patient will participate in aftercare plan   Met: No  Target date: 3-5 days post admission date   As evidenced by: Patient will participate within aftercare plan AEB aftercare provider and housing plan at discharge being identified.  6/22: CSW assessing for appropriate contacts      2. Goal (s): Patient will exhibit decreased depressive symptoms and suicidal ideations.   Met: No  Target date: 3-5 days post admission date   As evidenced by: Patient will utilize self-rating of depression at 3 or below  and demonstrate decreased signs of depression or be deemed stable for discharge by MD.   6/22: Goal progressing.    3. Goal(s): Patient will demonstrate decreased signs and symptoms of anxiety.   Met: No  Target date: 3-5 days post admission date   As evidenced by: Patient will utilize self-rating of anxiety at 3 or below and demonstrated decreased signs of anxiety, or be deemed stable for discharge by MD   6/22: Goal progressing.   4. Goal(s): Patient will demonstrate decreased signs of withdrawal due to substance abuse   Met: Yes  Target date: 3-5 days post admission date   As evidenced by: Patient will produce a CIWA/COWS score of 0, have stable vitals signs, and no symptoms of withdrawal   6/22: Patient produced a CIWA/COWS score of 0, has stable vitals signs, and no symptoms of withdrawal     5. Goal(s): Patient will demonstrate decreased signs of psychosis  * Met: No * Target date: 3-5 days post admission date  * As evidenced by: Patient will demonstrate decreased frequency of AVH or return to baseline function   6/22: Patient produced a CIWA/COWS score of 0, has stable vitals signs, and no symptoms of withdrawal       Attendees:  Patient: Walter Pena Family:  Physician: Dr. Weber Cooks, MD     05/08/2016 9:30 AM  Nursing: Carolynn Sayers, RN    05/08/2016 9:30 AM  Clinical Social Worker: Marylou Flesher, [place]  05/08/2016 9:30 AM  Recreational Therapist: Everitt Amber   05/08/2016 9:30 AM  Other:        05/08/2016 9:30 AM  Other:        05/08/2016 9:30 AM  Other:        05/08/2016 9:30 AM

## 2016-05-09 LAB — URINALYSIS COMPLETE WITH MICROSCOPIC (ARMC ONLY)
Bacteria, UA: NONE SEEN
Bilirubin Urine: NEGATIVE
GLUCOSE, UA: NEGATIVE mg/dL
KETONES UR: NEGATIVE mg/dL
Leukocytes, UA: NEGATIVE
NITRITE: NEGATIVE
Protein, ur: NEGATIVE mg/dL
SPECIFIC GRAVITY, URINE: 1.001 — AB (ref 1.005–1.030)
Squamous Epithelial / LPF: NONE SEEN
pH: 6 (ref 5.0–8.0)

## 2016-05-09 LAB — PROLACTIN: PROLACTIN: 27.2 ng/mL — AB (ref 4.0–15.2)

## 2016-05-09 MED ORDER — IBUPROFEN 600 MG PO TABS
600.0000 mg | ORAL_TABLET | Freq: Four times a day (QID) | ORAL | Status: DC | PRN
  Administered 2016-05-09 – 2016-05-11 (×2): 600 mg via ORAL
  Filled 2016-05-09 (×2): qty 1

## 2016-05-09 MED ORDER — FLUCONAZOLE 100 MG PO TABS
100.0000 mg | ORAL_TABLET | Freq: Every day | ORAL | Status: DC
  Administered 2016-05-09 – 2016-05-12 (×4): 100 mg via ORAL
  Filled 2016-05-09 (×4): qty 1

## 2016-05-09 NOTE — [facility] Group Notes (Signed)
[facility] Group Notes:  (Nursing/MHT/Case Management/Adjunct)  Date:  05/09/2016  Time:  4:15 AM  Type of Therapy:  Group Therapy  Participation Level:  Did Not Attend    Summary of Progress/Problems:  Shardae Kleinman Joy Joe Tanney 05/09/2016, 4:15 AM

## 2016-05-09 NOTE — Progress Notes (Signed)
D: Observed pt in room lying in bed. Patient alert and oriented x2. Pt disoriented to to time and situation. When asked about situation pt stated "been out of my medication" but pt had not other insight into behavior or mood prior to admission.  Patient denies SI/HI/AVH. Pt affect flat and depressed. Pt stated "day's been all right so far."Pt denied being depressed or anxious. Pt had no complaints. A: Offered active listening and support. Provided therapeutic communication. Administered scheduled medications. Encouraged pt to attend groups and actively participate in treatment.  R: Pt pleasant and cooperative. Pt indicated that he would attend group "tomorrow." Pt medication compliant. Will continue Q15 min. checks. Safety maintained.

## 2016-05-09 NOTE — Progress Notes (Signed)
Recreation Therapy Notes  Date: 06.23.17 Time: 1:00 pm Location: Craft Room  Group Topic: Coping Skills  Goal Area(s) Addresses:  Patient will participate in coping skill. Patient will verbalize benefit of art as a coping skill.  Behavioral Response: Attentive  Intervention: Coloring  Activity: Patients were given coloring sheets to color and instructed to think about the emotions they were feeling and what their mind was focused on.  Education: LRT educated patients on healthy coping skills.  Education Outcome: In group clarification offered  Clinical Observations/Feedback: Patient did not color coloring sheet. Patient left group at approximately 1:24 pm with social work. Patient returned to group at approximately 1:45 pm. Patient did not contribute to group discussion.  Jacquelynn Cree, LRT/CTRS 05/09/2016 3:19 PM

## 2016-05-09 NOTE — [facility] Counselor (Addendum)
Adult Comprehensive Assessment  Patient ID: Walter [place]., male   DOB: 1981-10-02, 35 y.o.   MRN: [id number]  Information Source: Information source: Patient  Current Stressors:  Educational / Learning stressors: No stressors identified - states that he got in some trouble his 12th grade year but does not believe that has impacted him today. Employment / Job issues: Wants to start back working but believes recieving disability has caused some impairment. Family Relationships: Pt states that sometimes he has conflict with family members that sometimes cause stress. Financial / Lack of resources (include bankruptcy): No stressors identified  Housing / Lack of housing: No stressors identified  Physical health (include injuries & life threatening diseases): Complains of gout and other health complications  Social relationships: No stressors identified  Substance abuse: No past hx according to pt. Bereavement / Loss: Lost his father in November 2016 which has caused some impairment to daily functioning  Living/Environment/Situation:  Living Arrangements: Other relatives (Cousin) Living conditions (as described by patient or guardian): "It's alright"  How long has patient lived in current situation?: Off and on for about 2/3 months What is atmosphere in current home: Chaotic, Temporary  Family History:  Marital status: Single  Childhood History:  Did patient suffer from severe childhood neglect?: No Was the patient ever a victim of a crime or a disaster?: No  Education:  Highest grade of school patient has completed: Some 12th grade  Currently a student?: No Learning disability?: No  Employment/Work Situation:   What is the longest time patient has a held a job?: 10-12 years  Where was the patient employed at that time?: [employer]  Did You Receive Any Psychiatric Treatment/Services While in the [employer]?: No  Financial Resources:   Surveyor, quantity resources: Writer,  Medicare Does patient have a Lawyer or guardian?: Yes Name of representative payee or guardian: Walter Pena, Walter Pena  Alcohol/Substance Abuse:   What has been your use of drugs/alcohol within the last 12 months?: a beer every once in awhile  If attempted suicide, did drugs/alcohol play a role in this?: No Alcohol/Substance Abuse Treatment Hx: Denies past history Has alcohol/substance abuse ever caused legal problems?: No  Social Support System:   Conservation officer, nature Support System: Fair Museum/gallery exhibitions officer System: Sometimes there are fights because of jealousy but nothing too bad Type of faith/religion: Raised christian  How does patient's faith help to cope with current illness?: prayer, reading the bible   Leisure/Recreation:   Pt enjoys watching tv, sports, and spending time with his child over the summer.  Strengths/Needs:   What things does the patient do well?: Patient cannot identify anything he does well at this time. In what areas does patient struggle / problems for patient: Patient cannot identify any struggles or room for improvements at this time.  Discharge Plan:   Does patient have access to transportation?: Yes Will patient be returning to same living situation after discharge?: Yes Currently receiving community mental health services: No If no, would patient like referral for services when discharged?: Yes (What county?) [facility]) Does patient have financial barriers related to discharge medications?: No Patient description of barriers related to discharge medications: N/A  Summary/Recommendations:   Summary and Recommendations (to be completed by the evaluator): Patient presented to the hospital for symptoms of depression. Patient is a 35 year old man with a history of schizophrenia sent from his group home. He has been feeling depressed but could not elaborate and has not been taking proper  care of himself. Patient was brought  involuntarily by the police and mother, Walter Pena  when they realized that pt has not been eating, interacting with others, washing or changing his clothes. Pt's primary diagnosis is Schizophrenia (HCC). Pt reports primary triggers for admission were depressive symptoms as listed above. Pt identifies stressors as losing close family members and not being successful in his children lives. Patient [place]  Pt states that his family is his support system.  Patient reports that he has seen mental health providers before but has poor insight on his diagnosis and does not see a need for mental health services. Patient will benefit from crisis stabilization, medication evaluation, group therapy, and psycho education in addition to case management for discharge planning. Patient and CSW reviewed pt's identified goals and treatment plan. Pt verbalized understanding and agreed to treatment plan.  At discharge it is recommended that patient remain compliant with established plan and continue treatment.  Lynden Oxford, LCSW-A 05/09/2016

## 2016-05-09 NOTE — [facility] Suicide Risk Assessment (Signed)
[facility] INPATIENT:  Family/Significant Other Suicide Prevention Education  Suicide Prevention Education:  Education Completed; Pt's mother, Walter Pena (ph#: [phone]) has been identified by the patient as the family member/significant other with whom the patient will be residing, and identified as the person(s) who will aid the patient in the event of a mental health crisis (suicidal ideations/suicide attempt).  With written consent from the patient, the family member/significant other has been provided the following suicide prevention education, prior to the and/or following the discharge of the patient. Pt's mother expressed concerns about HI, believe that the pt is not in a good place in his life. He is not compliant with medications, taking care of himself (bathing, changing clothes, and is not eating). Mother believes that he needs more care than she can provide but is willing to take him in to live with her after he is d/c if symptoms get better.  The suicide prevention education provided includes the following:  Suicide risk factors  Suicide prevention and interventions  National Suicide Hotline telephone number  [facility] assessment telephone number  [facility] Emergency Assistance 911  [facility] and/or Residential Mobile Crisis Unit telephone number  Request made of family/significant other to:  Remove weapons (e.g., guns, rifles, knives), all items previously/currently identified as safety concern.    Remove drugs/medications (over-the-counter, prescriptions, illicit drugs), all items previously/currently identified as a safety concern.  The family member/significant other verbalizes understanding of the suicide prevention education information provided.  The family member/significant other agrees to remove the items of safety concern listed above.  Walter Pena 05/09/2016, 2:30 PM

## 2016-05-09 NOTE — [facility] Group Notes (Signed)
[facility] LCSW Group Therapy  05/09/2016 1:42 PM  Type of Therapy:  Group Therapy  Participation Level:  Did Not Attend  Modes of Intervention:  Discussion, Education, Socialization and Support  Summary of Progress/Problems: Feelings around Relapse. Group members discussed the meaning of relapse and shared personal stories of relapse, how it affected them and others, and how they perceived themselves during this time. Group members were encouraged to identify triggers, warning signs and coping skills used when facing the possibility of relapse. Social supports were discussed and explored in detail.   Altonio Schwertner L Runette Scifres MSW, LCSWA  05/09/2016, 1:42 PM  

## 2016-05-09 NOTE — Plan of Care (Signed)
Problem: Health Behavior/Discharge Planning: Goal: Compliance with prescribed medication regimen will improve Outcome: Progressing Pt has been compliant with medication regimen this shift.

## 2016-05-09 NOTE — Progress Notes (Signed)
Affect brighter this shift.  Able to verbalize groin discomfort.  Unwilling to allow this writer to assess groin.  Verbalized "It is in a private area."  Medicated x1 with ibuprofen with good results. Dr. Imogene Burn down to assess groin infection. Instructed patient to ensure that he keeps the area clean and dry and he verbalized that he would. Attends groups although leaves early or arrives late.  No active participation.  More visible on the unit.  Up to dayroom watching TV.  Stays to self, no interaction noted with peers.  Support and encouragement offered.  Safety maintained.  Scheduled medications administered.

## 2016-05-09 NOTE — Progress Notes (Signed)
[facility] MD Progress Note  05/09/2016 11:55 AM [place].  MRN:  [id number] Subjective:  Follow-up for 35 year old man with schizophrenia. He was finally able to have at least a little bit of a conversation with me today which is better than he has done the last couple days. His insight is still not good. He really doesn't acknowledge any kind of problem that brought him into the hospital. He denies that he had been refusing food or failing to take care of himself. He did admit that he had been off his medicine but he got confused and couldn't give me much detail about it. He is denying any joint pain and seems unconcerned about his gout history. He did complain of discomfort in his groin today which is understandable given his hidradenitis. The emergency room physician also identified him as probably having fungal infections in the area. Patient denies any suicidal ideation and denies to me that he's having any sense of paranoia or is having any hallucinations. Principal Problem: Schizophrenia (HCC) Diagnosis:   Patient Active Problem List   Diagnosis Date Noted  . Noncompliance [Z91.19] 05/07/2016  . Schizophrenia (HCC) [F20.9] 05/07/2016  . HTN (hypertension) [I10] 01/21/2016  . Cannabis use disorder, moderate, dependence (HCC) [F12.20] 01/21/2016  . Tobacco use disorder [F17.200] 01/21/2016  . Gout [M10.9] 01/21/2016  . UTI (urinary tract infection) [N39.0] 01/21/2016  . Hidradenitis [L73.2] 01/21/2016  . 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 Multiple hospitalizations. He also has a history of noncompliance and poor insight which is led to repeated episodes of psychosis from refusing medication. The used to be followed by an active team but it sounds like he has either fired them more just gotten away from their treatment because he doesn't like to have people watching  over him.  Past Medical History:  Past Medical History  Diagnosis Date  . Gout   . Hypertension   . Schizophrenia (HCC)    History reviewed. No pertinent past surgical history. Family History: History reviewed. No pertinent family history. Family Psychiatric  History: Patient denies any family history. I'm not clear that there is any from the past history. Social History:  History  Alcohol Use  . Yes     History  Drug Use No    Social History   Social History  . Marital Status: Single    Spouse Name: N/A  . Number of Children: N/A  . Years of Education: N/A   Social History Main Topics  . Smoking status: Current Every Day Smoker -- 1.00 packs/day    Types: Cigarettes  . Smokeless tobacco: Never Used  . Alcohol Use: Yes  . Drug Use: No  . Sexual Activity: Not Asked   Other Topics Concern  . None   Social History Narrative   Additional Social History:                         Sleep: Fair  Appetite:  Fair  Current Medications: Current Facility-Administered Medications  Medication Dose Route Frequency Provider Last Rate Last Dose  . acetaminophen (TYLENOL) tablet 650 mg  650 mg Oral Q6H PRN Audery Amel, MD      . alum & mag hydroxide-simeth (MAALOX/MYLANTA) 200-200-20 MG/5ML suspension 30 mL  30 mL Oral Q4H PRN Audery Amel, MD      . amLODipine (NORVASC) tablet 10 mg  10 mg Oral Daily Audery Amel, MD   10 mg at 05/09/16 1048  . benazepril (LOTENSIN) tablet 40 mg  40 mg Oral Daily Audery Amel, MD   40 mg at 05/09/16 1048  . clindamycin (CLEOCIN) capsule 150 mg  150 mg Oral Q6H Audery Amel, MD   150 mg at 05/09/16 1610  . haloperidol (HALDOL) tablet 10 mg  10 mg Oral QHS Audery Amel, MD   10 mg at 05/08/16 2320  . ibuprofen (ADVIL,MOTRIN) tablet 600 mg  600 mg Oral Q6H PRN Audery Amel, MD   600 mg at 05/09/16 1048  . magnesium hydroxide (MILK OF MAGNESIA) suspension 30 mL  30 mL Oral Daily PRN Audery Amel, MD      . temazepam  (RESTORIL) capsule 15 mg  15 mg Oral QHS Audery Amel, MD   15 mg at 05/08/16 2320    Lab Results:  Results for orders placed or performed during the hospital encounter of 05/07/16 (from the past 48 hour(s))  Glucose, capillary     Status: Abnormal   Collection Time: 05/08/16  6:41 AM  Result Value Ref Range   Glucose-Capillary 124 (H) 65 - 99 mg/dL   Comment 1 Notify RN   Hemoglobin A1c     Status: Abnormal   Collection Time: 05/08/16  7:07 AM  Result Value Ref Range   Hgb A1c MFr Bld 6.2 (H) 4.0 - 6.0 %  Lipid panel     Status: Abnormal   Collection Time: 05/08/16  7:07 AM  Result Value Ref Range   Cholesterol 111 0 - 200 mg/dL   Triglycerides 97 <960 mg/dL   HDL 24 (L) >45 mg/dL   Total CHOL/HDL Ratio 4.6 RATIO   VLDL 19 0 - 40 mg/dL   LDL Cholesterol 68 0 - 99 mg/dL    Comment:        Total Cholesterol/HDL:CHD Risk Coronary Heart Disease Risk Table                     Men   Women  1/2 Average Risk   3.4   3.3  Average Risk       5.0   4.4  2 X Average Risk   9.6   7.1  3 X Average Risk  23.4   11.0        Use the calculated Patient Ratio above and the CHD Risk Table to determine the patient's CHD Risk.        ATP III CLASSIFICATION (LDL):  <100     mg/dL   Optimal  409-811  mg/dL   Near or Above                    Optimal  130-159  mg/dL   Borderline  914-782  mg/dL   High  >956     mg/dL   Very High   Prolactin     Status: Abnormal   Collection Time: 05/08/16  7:07 AM  Result Value Ref Range   Prolactin 27.2 (H) 4.0 - 15.2 ng/mL    Comment: (NOTE) Performed At: [facility] [address] [place], [place] [id number] Mila Homer MD IO:[id number]   TSH     Status: None   Collection Time: 05/08/16  7:07 AM  Result Value Ref Range   TSH 0.439 0.350 - 4.500 uIU/mL    Blood Alcohol level:  Lab Results  Component Value Date  ETH <5 05/07/2016   ETH <5 01/19/2016    Metabolic Disorder Labs: Lab Results  Component Value Date   HGBA1C  6.2* 05/08/2016   Lab Results  Component Value Date   PROLACTIN 27.2* 05/08/2016   PROLACTIN 6.9 01/19/2016   Lab Results  Component Value Date   CHOL 111 05/08/2016   TRIG 97 05/08/2016   HDL 24* 05/08/2016   CHOLHDL 4.6 05/08/2016   VLDL 19 05/08/2016   LDLCALC 68 05/08/2016   LDLCALC 82 01/23/2016    Physical Findings: AIMS:  , ,  ,  ,    CIWA:    COWS:     Musculoskeletal: Strength & Muscle Tone: within normal limits Gait & Station: He looks like he is a little bit uncomfortable in his groin area and it is reflected in the way he is walking although he minimizes it. It's not making him unsteady or unsafe to walk. Patient leans: Front  Psychiatric Specialty Exam: Physical Exam  Nursing note and vitals reviewed. Constitutional: He appears well-developed and well-nourished.  HENT:  Head: Normocephalic and atraumatic.  Eyes: Conjunctivae are normal. Pupils are equal, round, and reactive to light.  Neck: Normal range of motion.  Cardiovascular: Regular rhythm and normal heart sounds.   Respiratory: Effort normal. No respiratory distress.  GI: Soft.  Musculoskeletal: Normal range of motion.       Feet:  Neurological: He is alert.  Skin: Skin is warm and dry.     Psychiatric: His affect is blunt. His speech is delayed. He is slowed. He expresses inappropriate judgment. He expresses no suicidal ideation. He exhibits abnormal recent memory.    Review of Systems  Constitutional: Negative.   HENT: Negative.   Eyes: Negative.   Respiratory: Negative.   Cardiovascular: Negative.   Gastrointestinal: Negative.   Musculoskeletal: Negative.   Skin: Negative.        He has some swelling in his groin area from the hidradenitis  Neurological: Negative.   Psychiatric/Behavioral: Negative for depression, suicidal ideas, hallucinations, memory loss and substance abuse. The patient is not nervous/anxious and does not have insomnia.     Blood pressure 134/80, pulse 92,  temperature 99.4 F (37.4 C), temperature source Oral, resp. rate 18, height 5' 9.69" (1.77 m), weight 106.142 kg (234 lb), SpO2 100 %.Body mass index is 33.88 kg/(m^2).  General Appearance: Casual  Eye Contact:  Minimal  Speech:  Slow  Volume:  Decreased  Mood:  Euthymic  Affect:  Constricted and Restricted  Thought Process:  Disorganized  Orientation:  Full (Time, Place, and Person)  Thought Content:  Illogical  Suicidal Thoughts:  No  Homicidal Thoughts:  No  Memory:  Immediate;   Fair Recent;   Fair Remote;   Poor  Judgement:  Impaired  Insight:  Shallow  Psychomotor Activity:  Psychomotor Retardation  Concentration:  Concentration: Fair  Recall:  [employer] of Knowledge:  Fair  Language:  Fair  Akathisia:  No  Handed:  Right  AIMS (if indicated):     Assets:  Financial Resources/Insurance Housing Resilience  ADL's:  Impaired  Cognition:  Impaired,  Mild  Sleep:  Number of Hours: 7.5     Treatment Plan Summary: Daily contact with patient to assess and evaluate symptoms and progress in treatment, Medication management and Plan 35 year old man with schizophrenia who presented to the hospital under the concerns of his parents because of poor self-care. He is looking a little bit better today but I'm still concerned about his  long-term treatment. He is not showing signs of being acutely suicidal and he is not aggressive but he has chronic poor insight and I'm not sure how much we can do about that. He clearly functions better when he is on his antipsychotic but he does not show any willingness to acknowledge this. He tends to be very avoidant of conversation. He just barely takes care of his health and less it is acutely bothering him. Efforts have been made in the past to improve his care by getting him into group homes in having him follow-up with act team's but ultimately he gets out of those situations and goes back to living on his own with poor compliance. I'm not sure if  there is any way we can break this cycle. He probably is not going to qualify for guardianship. Tried to engage him in some conversation about his illness but he doesn't show much interest in it. He did accept a Haldol Decanoate shot and is being given oral Haldol as well which he tolerates fine without any side effects. His blood pressure is under okay control now that he is taking his medicine. He is not complaining of any gout symptoms and he reports to me that he does not normally take standing gout medicine so we will defer that. He is complaining of discomfort in his groin. I've written an order for the Motrin and also we have asked the hospitalist to come and take a look at it. He is also continuing on the clindamycin that was ordered on admission. Continued daily group therapy involvement. Likely length of stay probably in the 3-5 day range.  Mordecai Rasmussen, MD 05/09/2016, 11:55 AM

## 2016-05-09 NOTE — Consult Note (Addendum)
[facility] [place] at [facility]   PATIENT NAME: Walter Pena    MR#:  [id number]  DATE OF BIRTH:  1981/05/17  DATE OF ADMISSION:  05/07/2016  PRIMARY CARE PHYSICIAN: Luna Fuse, MD   REQUESTING/REFERRING PHYSICIAN: Audery Amel, MD  CHIEF COMPLAINT:  No chief complaint on file.  Reason for consult: groin infection. HISTORY OF PRESENT ILLNESS:  Walter Pena  is a 35 y.o. male with a known history of HTN, schizophrenia, gout and hidradenitis. He is admitted for schizophrenia but has groin area discomfort. He denies any fever or chills, no dysuria or hematuria.  PAST MEDICAL HISTORY:   Past Medical History  Diagnosis Date  . Gout   . Hypertension   . Schizophrenia (HCC)     PAST SURGICAL HISTOIRY:  History reviewed. No pertinent past surgical history.  SOCIAL HISTORY:   Social History  Substance Use Topics  . Smoking status: Current Every Day Smoker -- 1.00 packs/day    Types: Cigarettes  . Smokeless tobacco: Never Used  . Alcohol Use: Yes    FAMILY HISTORY:  History reviewed. No pertinent family history.  DRUG ALLERGIES:   Allergies  Allergen Reactions  . Bactrim [Sulfamethoxazole-Trimethoprim] Nausea And Vomiting  . Lisinopril Swelling    REVIEW OF SYSTEMS:  CONSTITUTIONAL: No fever, fatigue or weakness.  EYES: No blurred or double vision.  EARS, NOSE, AND THROAT: No tinnitus or ear pain.  RESPIRATORY: No cough, shortness of breath, wheezing or hemoptysis.  CARDIOVASCULAR: No chest pain, orthopnea, edema.  GASTROINTESTINAL: No nausea, vomiting, diarrhea or abdominal pain.  GENITOURINARY: No dysuria, hematuria. groin area discomfort. ENDOCRINE: No polyuria, nocturia,  HEMATOLOGY: No anemia, easy bruising or bleeding SKIN: No rash or lesion. MUSCULOSKELETAL: No joint pain or arthritis.   NEUROLOGIC: No tingling, numbness, weakness.  PSYCHIATRY: No anxiety or depression.   MEDICATIONS AT HOME:   Prior to  Admission medications   Medication Sig Start Date End Date Taking? Authorizing Provider  amLODipine-benazepril (LOTREL) 10-40 MG capsule Take 1 capsule by mouth daily. 01/24/16   Shari Prows, MD  clindamycin (CLEOCIN) 300 MG capsule Take 1 capsule (300 mg total) by mouth 3 (three) times daily. 03/27/16   Governor Rooks, MD  colchicine 0.6 MG tablet 1 tablet by mouth daily until gout pain is gone. 04/24/16   Joni Reining, PA-C  haloperidol (HALDOL) 10 MG tablet Take 1 tablet (10 mg total) by mouth at bedtime. 01/24/16   Shari Prows, MD  haloperidol decanoate (HALDOL DECANOATE) 100 MG/ML injection Inject 1 mL (100 mg total) into the muscle every 30 (thirty) days. 02/15/16   Shari Prows, MD  indomethacin (INDOCIN) 50 MG capsule Take 1 capsule (50 mg total) by mouth 2 (two) times daily with a meal. 04/24/16   Joni Reining, PA-C  oxyCODONE-acetaminophen (ROXICET) 5-325 MG tablet Take 1-2 tablets by mouth every 4 (four) hours as needed for severe pain. 04/24/16   Joni Reining, PA-C  temazepam (RESTORIL) 15 MG capsule Take 1 capsule (15 mg total) by mouth at bedtime. 01/24/16   Shari Prows, MD      VITAL SIGNS:  Blood pressure 134/80, pulse 92, temperature 99.4 F (37.4 C), temperature source Oral, resp. rate 18, height 5' 9.69" (1.77 m), weight 234 lb (106.142 kg), SpO2 100 %.  PHYSICAL EXAMINATION:  GENERAL:  35 y.o.-year-old patient lying in the bed with no acute distress.  EYES: Pupils equal, round, reactive to light and accommodation. No scleral icterus.  Extraocular muscles intact.  HEENT: Head atraumatic, normocephalic. Oropharynx and nasopharynx clear.  NECK:  Supple, no jugular venous distention. No thyroid enlargement, no tenderness.  LUNGS: Normal breath sounds bilaterally, no wheezing, rales,rhonchi or crepitation. No use of accessory muscles of respiration.  CARDIOVASCULAR: S1, S2 normal. No murmurs, rubs, or gallops.  ABDOMEN: Soft, nontender, nondistended.  Bowel sounds present. No organomegaly or mass. groin area discomfort, in powders, no tenderness. EXTREMITIES: No pedal edema, cyanosis, or clubbing.  NEUROLOGIC: Cranial nerves II through XII are intact. Muscle strength 5/5 in all extremities. Sensation intact. Gait not checked.  PSYCHIATRIC: The patient is alert and oriented x 3.  SKIN: No obvious rash, lesion, or ulcer.   LABORATORY PANEL:   CBC  Recent Labs Lab 05/07/16 1105  WBC 8.6  HGB 14.8  HCT 42.2  PLT 286   ------------------------------------------------------------------------------------------------------------------  Chemistries   Recent Labs Lab 05/07/16 1105  NA 138  K 3.7  CL 101  CO2 29  GLUCOSE 118*  BUN 8  CREATININE 1.10  CALCIUM 9.2  AST 34  ALT 36  ALKPHOS 64  BILITOT 0.8   ------------------------------------------------------------------------------------------------------------------  Cardiac Enzymes No results for input(s): TROPONINI in the last 168 hours. ------------------------------------------------------------------------------------------------------------------  RADIOLOGY:  No results found.  EKG:   Orders placed or performed in visit on 03/06/13  . EKG 12-Lead    IMPRESSION AND PLAN:   Hidradenitis. Continue clindamycin, add fluconazole. Clean groin area.  HTN, controlled, continue norvasc and benazepril. Smoking cessation. Counseled for 3 min.   All the records are reviewed and case discussed with Consulting provider. Management plans discussed with the patient, family and they are in agreement.  CODE STATUS: full code.  TOTAL TIME TAKING CARE OF THIS PATIENT:38 minutes.    Shaune Pollack M.D on 05/09/2016 at 12:37 PM  Between 7am to 6pm - Pager - [phone]  After 6pm go to [email or web address] - password [email or web address]  [place] [facility]  Office  [phone]  CC: Primary care Physician: Luna Fuse, MD

## 2016-05-10 DIAGNOSIS — F203 Undifferentiated schizophrenia: Secondary | ICD-10-CM

## 2016-05-10 MED ORDER — HALOPERIDOL DECANOATE 100 MG/ML IM SOLN
100.0000 mg | INTRAMUSCULAR | Status: DC
  Administered 2016-05-10: 100 mg via INTRAMUSCULAR
  Filled 2016-05-10: qty 1

## 2016-05-10 NOTE — Plan of Care (Signed)
Problem: Safety: Goal: Periods of time without injury will increase Outcome: Progressing Pt remains free from harm.  Problem: Education: Goal: Will be free of psychotic symptoms Outcome: Progressing Pt denies AVH at this time. He does not appear to be responding to internal stimuli.   

## 2016-05-10 NOTE — Progress Notes (Signed)
[facility] MD Progress Note  05/10/2016 11:59 AM Neta Ehlers.  MRN:  [id number]  Subjective:  Mr. Leach is still rather psychotic and disorganized but he does accept medications. Today he recognized me from our previous encounters. He feels embarrassed that he has not showered today and tells me that his sister is drinking his clothes and he will take his shower later. He slept well. Appetite is good. He denies any side effects of medications there are no somatic complaints. He went to morning groups.  Principal Problem: Schizophrenia (HCC) Diagnosis:   Patient Active Problem List   Diagnosis Date Noted  . 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 Noncompliance [Z91.19] 05/07/2016  . Schizophrenia (HCC) [F20.9] 05/07/2016  . HTN (hypertension) [I10] 01/21/2016  . Cannabis use disorder, moderate, dependence (HCC) [F12.20] 01/21/2016  . Tobacco use disorder [F17.200] 01/21/2016  . Gout [M10.9] 01/21/2016  . UTI (urinary tract infection) [N39.0] 01/21/2016  . Hidradenitis [L73.2] 01/21/2016  . 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  Past Medical History:  Past Medical History  Diagnosis Date  . Gout   . Hypertension   . Schizophrenia (HCC)    History reviewed. No pertinent past surgical history. Family History: History reviewed. No pertinent family history. Family Psychiatric  History: See H&P. Social History:  History  Alcohol Use  . Yes     History  Drug Use No    Social History   Social History  . Marital Status: Single    Spouse Name: N/A  . Number of Children: N/A  . Years of Education: N/A   Social History Main Topics  . Smoking status: Current Every Day Smoker -- 1.00 packs/day    Types: Cigarettes  . Smokeless tobacco: Never Used  . Alcohol Use: Yes  . Drug Use: No  . Sexual Activity: Not Asked   Other Topics Concern  . None   Social History  Narrative   Additional Social History:                         Sleep: Fair  Appetite:  Fair  Current Medications: Current Facility-Administered Medications  Medication Dose Route Frequency Provider Last Rate Last Dose  . acetaminophen (TYLENOL) tablet 650 mg  650 mg Oral Q6H PRN Audery Amel, MD   650 mg at 05/09/16 2204  . alum & mag hydroxide-simeth (MAALOX/MYLANTA) 200-200-20 MG/5ML suspension 30 mL  30 mL Oral Q4H PRN Audery Amel, MD      . amLODipine (NORVASC) tablet 10 mg  10 mg Oral Daily Audery Amel, MD   10 mg at 05/10/16 0848  . benazepril (LOTENSIN) tablet 40 mg  40 mg Oral Daily Audery Amel, MD   40 mg at 05/10/16 0848  . clindamycin (CLEOCIN) capsule 150 mg  150 mg Oral Q6H Audery Amel, MD   150 mg at 05/10/16 1133  . fluconazole (DIFLUCAN) tablet 100 mg  100 mg Oral Daily Shaune Pollack, MD   100 mg at 05/10/16 0847  . haloperidol (HALDOL) tablet 10 mg  10 mg Oral QHS Audery Amel, MD   10 mg at 05/09/16 2139  . haloperidol decanoate (HALDOL DECANOATE) 100 MG/ML injection 100 mg  100 mg Intramuscular Q30 days Margaretha Mahan B Shanice Poznanski, MD      . ibuprofen (ADVIL,MOTRIN) tablet 600 mg  600 mg Oral Q6H PRN Audery Amel, MD  600 mg at 05/09/16 1048  . magnesium hydroxide (MILK OF MAGNESIA) suspension 30 mL  30 mL Oral Daily PRN Audery Amel, MD      . temazepam (RESTORIL) capsule 15 mg  15 mg Oral QHS Audery Amel, MD   15 mg at 05/09/16 2139    Lab Results:  Results for orders placed or performed during the hospital encounter of 05/07/16 (from the past 48 hour(s))  Urinalysis complete, with microscopic ([facility] only)     Status: Abnormal   Collection Time: 05/09/16  2:26 PM  Result Value Ref Range   Color, Urine STRAW (A) YELLOW   APPearance CLEAR (A) CLEAR   Glucose, UA NEGATIVE NEGATIVE mg/dL   Bilirubin Urine NEGATIVE NEGATIVE   Ketones, ur NEGATIVE NEGATIVE mg/dL   Specific Gravity, Urine 1.001 (L) 1.005 - 1.030   Hgb urine dipstick 1+ (A)  NEGATIVE   pH 6.0 5.0 - 8.0   Protein, ur NEGATIVE NEGATIVE mg/dL   Nitrite NEGATIVE NEGATIVE   Leukocytes, UA NEGATIVE NEGATIVE   RBC / HPF 0-5 0 - 5 RBC/hpf   WBC, UA 0-5 0 - 5 WBC/hpf   Bacteria, UA NONE SEEN NONE SEEN   Squamous Epithelial / LPF NONE SEEN NONE SEEN    Blood Alcohol level:  Lab Results  Component Value Date   ETH <5 05/07/2016   ETH <5 01/19/2016    Metabolic Disorder Labs: Lab Results  Component Value Date   HGBA1C 6.2* 05/08/2016   Lab Results  Component Value Date   PROLACTIN 27.2* 05/08/2016   PROLACTIN 6.9 01/19/2016   Lab Results  Component Value Date   CHOL 111 05/08/2016   TRIG 97 05/08/2016   HDL 24* 05/08/2016   CHOLHDL 4.6 05/08/2016   VLDL 19 05/08/2016   LDLCALC 68 05/08/2016   LDLCALC 82 01/23/2016    Physical Findings: AIMS:  , ,  ,  ,    CIWA:    COWS:     Musculoskeletal: Strength & Muscle Tone: within normal limits Gait & Station: normal Patient leans: N/A  Psychiatric Specialty Exam: Physical Exam  Nursing note and vitals reviewed.   Review of Systems  Psychiatric/Behavioral: Positive for hallucinations.    Blood pressure 125/85, pulse 63, temperature 98.2 F (36.8 C), temperature source Oral, resp. rate 18, height 5' 9.69" (1.77 m), weight 106.142 kg (234 lb), SpO2 100 %.Body mass index is 33.88 kg/(m^2).  General Appearance: Disheveled  Eye Contact:  Fair  Speech:  Clear and Coherent  Volume:  Normal  Mood:  Anxious  Affect:  Appropriate  Thought Process:  Disorganized  Orientation:  Full (Time, Place, and Person)  Thought Content:  Delusions, Hallucinations: Auditory and Paranoid Ideation  Suicidal Thoughts:  No  Homicidal Thoughts:  No  Memory:  Immediate;   Fair Recent;   Fair Remote;   Fair  Judgement:  Poor  Insight:  Lacking  Psychomotor Activity:  Decreased  Concentration:  Concentration: Fair and Attention Span: Fair  Recall:  [employer] of Knowledge:  Fair  Language:  Fair  Akathisia:  No   Handed:  Right  AIMS (if indicated):     Assets:  Communication Skills Desire for Improvement Financial Resources/Insurance Housing Physical Health Resilience Social Support  ADL's:  Intact  Cognition:  WNL  Sleep:  Number of Hours: 6.5     Treatment Plan Summary: Daily contact with patient to assess and evaluate symptoms and progress in treatment and Medication management   Mr. Mancha  is a 35 year old male with history of schizophrenia admitted for psychotic break in the context of treatment noncompliance.  1. Psychosis. He was restarted on oral Haldol and received Haldol Decanoate on June 21.  2. Insomnia. He is on Restoril.   3. Hypertension. He's on Lotensin and Norvasc. Medicine consult is greatly appreciated.  4. Hydradenitis. He is on clindamycin. Fluconazole was added by medicine consultant.   5. Disposition. He will be discharged with his mother. He will follow-up with his act team if possible.  Kristine Linea, MD 05/10/2016, 11:59 AM

## 2016-05-10 NOTE — Progress Notes (Signed)
  Pt has been pleasant and cooperative. Pt's mood has been depressed and affect guarded/ paranoid. Pt has been out in the milieu with limited verbal interactions with peers or staff. Pt continues to be disorganized and psychotic. Pt has been compliant with taking all meds. Haldol decanoate s IM given without incident.

## 2016-05-10 NOTE — Progress Notes (Signed)
[facility] -  at [facility]   PATIENT NAME: Walter Pena    MR#:  [id number]  DATE OF BIRTH:  [birth year]  SUBJECTIVE:  CHIEF COMPLAINT:  Medical consult for groin swelling.   He told me -" Dr.Shaukat Welton Flakes did some procedure on my lymphnode and weat gland last month, since then i have this swelling, and this procedure was done here , in this hospital." I could not find any records of the procedure.  He have c/o pain while walking and have to apply powder to his scrotum.  REVIEW OF SYSTEMS:  CONSTITUTIONAL: No fever, fatigue or weakness.  EYES: No blurred or double vision.  EARS, NOSE, AND THROAT: No tinnitus or ear pain.  RESPIRATORY: No cough, shortness of breath, wheezing or hemoptysis.  CARDIOVASCULAR: No chest pain, orthopnea, edema.  GASTROINTESTINAL: No nausea, vomiting, diarrhea or abdominal pain.  GENITOURINARY: No dysuria, hematuria.  ENDOCRINE: No polyuria, nocturia,  HEMATOLOGY: No anemia, easy bruising or bleeding SKIN: No rash or lesion. MUSCULOSKELETAL: No joint pain or arthritis. Scrotal swelling.  NEUROLOGIC: No tingling, numbness, weakness.  PSYCHIATRY: No anxiety or depression.   ROS  DRUG ALLERGIES:   Allergies  Allergen Reactions  . Bactrim [Sulfamethoxazole-Trimethoprim] Nausea And Vomiting  . Lisinopril Swelling    VITALS:  Blood pressure 125/85, pulse 63, temperature 98.2 F (36.8 C), temperature source Oral, resp. rate 18, height 5' 9.69" (1.77 m), weight 106.142 kg (234 lb), SpO2 100 %.  PHYSICAL EXAMINATION:  GENERAL:  35 y.o.-year-old patient lying in the bed with no acute distress.  EYES: Pupils equal, round, reactive to light and accommodation. No scleral icterus. Extraocular muscles intact.  HEENT: Head atraumatic, normocephalic. Oropharynx and nasopharynx clear.  NECK:  Supple, no jugular venous distention. No thyroid enlargement, no tenderness.  LUNGS: Normal breath sounds bilaterally, no wheezing, rales,rhonchi or  crepitation. No use of accessory muscles of respiration.  CARDIOVASCULAR: S1, S2 normal. No murmurs, rubs, or gallops.  ABDOMEN: Soft, nontender, nondistended. Bowel sounds present. No organomegaly or mass. Groin swelling both side, with powder applied to that. EXTREMITIES: No pedal edema, cyanosis, or clubbing.  NEUROLOGIC: Cranial nerves II through XII are intact. Muscle strength 5/5 in all extremities. Sensation intact. Gait not checked.  PSYCHIATRIC: The patient is alert and oriented x 3.  SKIN: No obvious rash, lesion, or ulcer.   Physical Exam LABORATORY PANEL:   CBC  Recent Labs Lab 05/07/16 1105  WBC 8.6  HGB 14.8  HCT 42.2  PLT 286   ------------------------------------------------------------------------------------------------------------------  Chemistries   Recent Labs Lab 05/07/16 1105  NA 138  K 3.7  CL 101  CO2 29  GLUCOSE 118*  BUN 8  CREATININE 1.10  CALCIUM 9.2  AST 34  ALT 36  ALKPHOS 64  BILITOT 0.8   ------------------------------------------------------------------------------------------------------------------  Cardiac Enzymes No results for input(s): TROPONINI in the last 168 hours. ------------------------------------------------------------------------------------------------------------------  RADIOLOGY:  No results found.  ASSESSMENT AND PLAN:   Principal Problem:   Schizophrenia (HCC) Active Problems:   HTN (hypertension)   Gout   Hidradenitis   Noncompliance   Disorganized schizophrenia (HCC)  *Hidradenitis. Continue clindamycin, add fluconazole. Clean groin area.  *HTN, controlled, continue norvasc and benazepril.  *Smoking cessation. Counseled for 3 min.   * schizophrenia- treat as per primary team.  All the records are reviewed and case discussed with Care Management/Social Workerr. Management plans discussed with the patient, family and they are in agreement.  CODE STATUS:full  TOTAL TIME TAKING CARE OF  THIS PATIENT: 35  minutes.    Altamese Dilling M.D on 05/10/2016   Between 7am to 6pm - Pager - [phone]  After 6pm go to [email or web address] - password [email or web address]  [facility] [place] [facility]  Office  [phone]  CC: Primary care physician; Luna Fuse, MD  Note: This dictation was prepared with Dragon dictation along with smaller phrase technology. Any transcriptional errors that result from this process are unintentional.

## 2016-05-10 NOTE — [facility] Group Notes (Signed)
[facility] LCSW Group Therapy  05/10/2016 3:08 PM  Type of Therapy:  Group Therapy  Participation Level:  Did Not Attend  Modes of Intervention:  Discussion, Education, Socialization and Support  Summary of Progress/Problems: Pt will identify unhealthy thoughts and how they impact their emotions and behavior. Pt will be encouraged to discuss these thoughts, emotions and behaviors with the group.   [employer] MSW, LCSWA  05/10/2016, 3:08 PM

## 2016-05-10 NOTE — [facility] Group Notes (Signed)
[facility] Group Notes:  (Nursing/MHT/Case Management/Adjunct)  Date:  05/10/2016  Time:  3:22 AM  Type of Therapy:  Group Therapy  Participation Level:  Active  Participation Quality:  Appropriate  Affect:  Appropriate  Cognitive:  Appropriate  Insight:  Appropriate  Engagement in Group:  Engaged  Modes of Intervention:  n/a  Summary of Progress/Problems:  Veva Holes 05/10/2016, 3:22 AM

## 2016-05-10 NOTE — Progress Notes (Signed)
D: Pt is seen in the milieu this evening. He continues to maintain minimal interaction with peers. Pt rates depression and anxiety 0/10. Denies SI/HI/AVH at this time. Pt requests PRN medication appropriately for groin pain.  A: Emotional support and encouragement provided. Medications administered with education. q15 minute safety checks maintained. R: Pt remains free from harm. Will continue to monitor.

## 2016-05-11 DIAGNOSIS — F203 Undifferentiated schizophrenia: Secondary | ICD-10-CM

## 2016-05-11 MED ORDER — TEMAZEPAM 15 MG PO CAPS
30.0000 mg | ORAL_CAPSULE | Freq: Every day | ORAL | Status: DC
  Administered 2016-05-11: 30 mg via ORAL
  Filled 2016-05-11: qty 2

## 2016-05-11 NOTE — [facility] Group Notes (Signed)
[facility] Group Notes:  (Nursing/MHT/Case Management/Adjunct)  Date:  05/11/2016  Time:  10:25 AM  Type of Therapy:  goal setting  Participation Level:  Did Not Attend  Walter Pena [name] 05/11/2016, 10:25 AM 

## 2016-05-11 NOTE — Progress Notes (Signed)
[facility] MD Progress Note  05/11/2016 1:05 PM Neta Ehlers.  MRN:  [id number]  Subjective: Mr. Ducat still feels very disorganized by slightly better to engage. He still did not take a shower and there is a strong body odor stemming from his skin condition. 2. He is preoccupied with his discharge. He wants to make sure that he will be discharged in time to pay for his rent. He is worried that he may lose his apartment. He denies any symptoms of depression and anxiety or psychosis. There are no side effects from medications. He participates in programming some   Principal Problem: Schizophrenia (HCC) Diagnosis:   Patient Active Problem List   Diagnosis Date Noted  . 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 Noncompliance [Z91.19] 05/07/2016  . Schizophrenia (HCC) [F20.9] 05/07/2016  . HTN (hypertension) [I10] 01/21/2016  . Cannabis use disorder, moderate, dependence (HCC) [F12.20] 01/21/2016  . Tobacco use disorder [F17.200] 01/21/2016  . Gout [M10.9] 01/21/2016  . UTI (urinary tract infection) [N39.0] 01/21/2016  . Hidradenitis [L73.2] 01/21/2016  . 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  Past Medical History:  Past Medical History  Diagnosis Date  . Gout   . Hypertension   . Schizophrenia (HCC)    History reviewed. No pertinent past surgical history. Family History: History reviewed. No pertinent family history. Family Psychiatric  History: See H&P. Social History:  History  Alcohol Use  . Yes     History  Drug Use No    Social History   Social History  . Marital Status: Single    Spouse Name: N/A  . Number of Children: N/A  . Years of Education: N/A   Social History Main Topics  . Smoking status: Current Every Day Smoker -- 1.00 packs/day    Types: Cigarettes  . Smokeless tobacco: Never Used  . Alcohol Use: Yes  . Drug Use: No  . Sexual Activity: Not  Asked   Other Topics Concern  . None   Social History Narrative   Additional Social History:                         Sleep: Fair  Appetite:  Fair  Current Medications: Current Facility-Administered Medications  Medication Dose Route Frequency Provider Last Rate Last Dose  . acetaminophen (TYLENOL) tablet 650 mg  650 mg Oral Q6H PRN Audery Amel, MD   650 mg at 05/11/16 1008  . alum & mag hydroxide-simeth (MAALOX/MYLANTA) 200-200-20 MG/5ML suspension 30 mL  30 mL Oral Q4H PRN Audery Amel, MD      . amLODipine (NORVASC) tablet 10 mg  10 mg Oral Daily Audery Amel, MD   10 mg at 05/11/16 0848  . benazepril (LOTENSIN) tablet 40 mg  40 mg Oral Daily Audery Amel, MD   40 mg at 05/11/16 0847  . clindamycin (CLEOCIN) capsule 150 mg  150 mg Oral Q6H Audery Amel, MD   150 mg at 05/11/16 1215  . fluconazole (DIFLUCAN) tablet 100 mg  100 mg Oral Daily Shaune Pollack, MD   100 mg at 05/11/16 0848  . haloperidol (HALDOL) tablet 10 mg  10 mg Oral QHS Audery Amel, MD   10 mg at 05/10/16 2119  . haloperidol decanoate (HALDOL DECANOATE) 100 MG/ML injection 100 mg  100 mg Intramuscular Q30 days Shari Prows, MD   100 mg at 05/10/16  1304  . ibuprofen (ADVIL,MOTRIN) tablet 600 mg  600 mg Oral Q6H PRN Audery Amel, MD   600 mg at 05/09/16 1048  . magnesium hydroxide (MILK OF MAGNESIA) suspension 30 mL  30 mL Oral Daily PRN Audery Amel, MD      . temazepam (RESTORIL) capsule 15 mg  15 mg Oral QHS Audery Amel, MD   15 mg at 05/10/16 2119    Lab Results:  Results for orders placed or performed during the hospital encounter of 05/07/16 (from the past 48 hour(s))  Urinalysis complete, with microscopic ([facility] only)     Status: Abnormal   Collection Time: 05/09/16  2:26 PM  Result Value Ref Range   Color, Urine STRAW (A) YELLOW   APPearance CLEAR (A) CLEAR   Glucose, UA NEGATIVE NEGATIVE mg/dL   Bilirubin Urine NEGATIVE NEGATIVE   Ketones, ur NEGATIVE NEGATIVE mg/dL    Specific Gravity, Urine 1.001 (L) 1.005 - 1.030   Hgb urine dipstick 1+ (A) NEGATIVE   pH 6.0 5.0 - 8.0   Protein, ur NEGATIVE NEGATIVE mg/dL   Nitrite NEGATIVE NEGATIVE   Leukocytes, UA NEGATIVE NEGATIVE   RBC / HPF 0-5 0 - 5 RBC/hpf   WBC, UA 0-5 0 - 5 WBC/hpf   Bacteria, UA NONE SEEN NONE SEEN   Squamous Epithelial / LPF NONE SEEN NONE SEEN    Blood Alcohol level:  Lab Results  Component Value Date   ETH <5 05/07/2016   ETH <5 01/19/2016    Metabolic Disorder Labs: Lab Results  Component Value Date   HGBA1C 6.2* 05/08/2016   Lab Results  Component Value Date   PROLACTIN 27.2* 05/08/2016   PROLACTIN 6.9 01/19/2016   Lab Results  Component Value Date   CHOL 111 05/08/2016   TRIG 97 05/08/2016   HDL 24* 05/08/2016   CHOLHDL 4.6 05/08/2016   VLDL 19 05/08/2016   LDLCALC 68 05/08/2016   LDLCALC 82 01/23/2016    Physical Findings: AIMS:  , ,  ,  ,    CIWA:    COWS:     Musculoskeletal: Strength & Muscle Tone: within normal limits Gait & Station: normal Patient leans: N/A  Psychiatric Specialty Exam: Physical Exam  Nursing note and vitals reviewed.   Review of Systems  Skin: Positive for rash.  Psychiatric/Behavioral: Positive for depression and hallucinations.  All other systems reviewed and are negative.   Blood pressure 132/87, pulse 64, temperature 98.8 F (37.1 C), temperature source Oral, resp. rate 18, height 5' 9.69" (1.77 m), weight 106.142 kg (234 lb), SpO2 100 %.Body mass index is 33.88 kg/(m^2).  General Appearance: Disheveled  Eye Contact:  Fair  Speech:  Clear and Coherent  Volume:  Normal  Mood:  Anxious  Affect:  Blunt  Thought Process:  Disorganized  Orientation:  Full (Time, Place, and Person)  Thought Content:  Hallucinations: Auditory  Suicidal Thoughts:  No  Homicidal Thoughts:  No  Memory:  Immediate;   Fair Recent;   Fair Remote;   Fair  Judgement:  Poor  Insight:  Lacking  Psychomotor Activity:  Decreased   Concentration:  Concentration: Poor and Attention Span: Poor  Recall:  Poor  Fund of Knowledge:  Poor  Language:  Poor  Akathisia:  No  Handed:  Right  AIMS (if indicated):     Assets:  Communication Skills Desire for Improvement Financial Resources/Insurance Housing Physical Health Resilience Social Support  ADL's:  Intact  Cognition:  WNL  Sleep:  Number  of Hours: 3.75     Treatment Plan Summary: Daily contact with patient to assess and evaluate symptoms and progress in treatment and Medication management   Mr. Koy is a 35 year old male with history of schizophrenia admitted for psychotic break in the context of treatment noncompliance.  1. Psychosis. He was restarted on oral Haldol and received Haldol Decanoate on June 21.  2. Insomnia. He is on Restoril but slept less than 4 hours last night. Will increase Restoril.  3. Hypertension. He's on Lotensin and Norvasc. Medicine consult is greatly appreciated.  4. Hydradenitis. He is on clindamycin. Fluconazole was added by medicine consultant.   5. Disposition. He will be discharged with his mother. He will follow-up with his act team if possible.  Kristine Linea, MD 05/11/2016, 1:05 PM

## 2016-05-11 NOTE — Progress Notes (Signed)
Pt has been pleasant and cooperative. Pt's mood has been less depressed and affect less guarded/ paranoid. Pt has been out in the milieu with limited verbal interactions with peers and  staff. Pt's thoughts appears a little  more organized. Pt has been compliant with taking all meds. Pt denies SI and A/V hallucinations.

## 2016-05-11 NOTE — Progress Notes (Signed)
D: Pt appears less disorganized this evening. He is seen in the milieu with minimal interaction with staff and peers. Pt paces the halls at times. Denies SI/HI/AVH at this time. Pt reports groin pain and requests PRN medication appropriately.  A: Emotional support and encouragement provided. Medications administered with education. q15 minute safety checks maintained. R: Pt remains free from harm. Will continue to monitor.

## 2016-05-11 NOTE — [facility] Group Notes (Signed)
[facility] Group Notes:  (Nursing/MHT/Case Management/Adjunct)  Date:  05/11/2016  Time:  1:55 AM  Type of Therapy:  Group Therapy  Participation Level:  Active  Participation Quality:  Appropriate  Affect:  Appropriate  Cognitive:  Appropriate  Insight:  Appropriate  Engagement in Group:  Engaged  Modes of Intervention:  n/a  Summary of Progress/Problems:  Walter Pena 05/11/2016, 1:55 AM

## 2016-05-11 NOTE — Plan of Care (Signed)
Problem: Education: Goal: Mental status will improve Outcome: Progressing Pt appears less disorganized this evening. He is visible in the milieu.  Problem: Role Relationship: Goal: Ability to communicate needs accurately will improve Outcome: Progressing Pt communicates his needs appropriately and attends to his ADLs.

## 2016-05-11 NOTE — Progress Notes (Signed)
[facility] [place] [facility]   PATIENT NAME: Walter Pena    MR#:  [id number]  DATE OF BIRTH:  13-May-1981  SUBJECTIVE:  CHIEF COMPLAINT:  Medical consult for groin swelling.   He told me -" Dr.Shaukat Welton Flakes did some procedure on my lymphnode and weat gland last month, since then i have this swelling, and this procedure was done here , in this hospital." I could not find any records of the procedure.  He said, he took showers today and feels little better.  REVIEW OF SYSTEMS:  CONSTITUTIONAL: No fever, fatigue or weakness.  EYES: No blurred or double vision.  EARS, NOSE, AND THROAT: No tinnitus or ear pain.  RESPIRATORY: No cough, shortness of breath, wheezing or hemoptysis.  CARDIOVASCULAR: No chest pain, orthopnea, edema.  GASTROINTESTINAL: No nausea, vomiting, diarrhea or abdominal pain.  GENITOURINARY: No dysuria, hematuria.  ENDOCRINE: No polyuria, nocturia,  HEMATOLOGY: No anemia, easy bruising or bleeding SKIN: No rash or lesion. MUSCULOSKELETAL: No joint pain or arthritis. Scrotal swelling.  NEUROLOGIC: No tingling, numbness, weakness.  PSYCHIATRY: No anxiety or depression.   ROS  DRUG ALLERGIES:   Allergies  Allergen Reactions  . Bactrim [Sulfamethoxazole-Trimethoprim] Nausea And Vomiting  . Lisinopril Swelling    VITALS:  Blood pressure 132/87, pulse 64, temperature 98.8 F (37.1 C), temperature source Oral, resp. rate 18, height 5' 9.69" (1.77 m), weight 106.142 kg (234 lb), SpO2 100 %.  PHYSICAL EXAMINATION:  GENERAL:  35 y.o.-year-old patient lying in the bed with no acute distress.  EYES: Pupils equal, round, reactive to light and accommodation. No scleral icterus. Extraocular muscles intact.  HEENT: Head atraumatic, normocephalic. Oropharynx and nasopharynx clear.  NECK:  Supple, no jugular venous distention. No thyroid enlargement, no tenderness.  LUNGS: Normal breath sounds bilaterally, no wheezing, rales,rhonchi or crepitation. No  use of accessory muscles of respiration.  CARDIOVASCULAR: S1, S2 normal. No murmurs, rubs, or gallops.  ABDOMEN: Soft, nontender, nondistended. Bowel sounds present. No organomegaly or mass. Groin swelling both side, no fluctuations. EXTREMITIES: No pedal edema, cyanosis, or clubbing.  NEUROLOGIC: Cranial nerves II through XII are intact. Muscle strength 5/5 in all extremities. Sensation intact. Gait not checked.  PSYCHIATRIC: The patient is alert and oriented x 3.  SKIN: No obvious rash, lesion, or ulcer.   Physical Exam LABORATORY PANEL:   CBC  Recent Labs Lab 05/07/16 1105  WBC 8.6  HGB 14.8  HCT 42.2  PLT 286   ------------------------------------------------------------------------------------------------------------------  Chemistries   Recent Labs Lab 05/07/16 1105  NA 138  K 3.7  CL 101  CO2 29  GLUCOSE 118*  BUN 8  CREATININE 1.10  CALCIUM 9.2  AST 34  ALT 36  ALKPHOS 64  BILITOT 0.8   ------------------------------------------------------------------------------------------------------------------  Cardiac Enzymes No results for input(s): TROPONINI in the last 168 hours. ------------------------------------------------------------------------------------------------------------------  RADIOLOGY:  No results found.  ASSESSMENT AND PLAN:   Principal Problem:   Schizophrenia (HCC) Active Problems:   HTN (hypertension)   Gout   Hidradenitis   Noncompliance   Disorganized schizophrenia (HCC)   Undifferentiated schizophrenia (HCC)  *Hidradenitis. Continue clindamycin, added fluconazole. Clean groin area.   Feels better.  *HTN, controlled, continue norvasc and benazepril.  *Smoking cessation. Counseled for 3 min.   * schizophrenia- treat as per primary team.  All the records are reviewed and case discussed with Care Management/Social Workerr. Management plans discussed with the patient, family and they are in agreement.  CODE  STATUS:full  TOTAL TIME TAKING CARE OF THIS  PATIENT: 35 minutes.    Altamese Dilling M.D on 05/11/2016   Between 7am to 6pm - Pager - [phone]  After 6pm go to [email or web address] - password [email or web address]  [facility]  Office  [phone]  CC: Primary care physician; Luna Fuse, MD  Note: This dictation was prepared with Dragon dictation along with smaller phrase technology. Any transcriptional errors that result from this process are unintentional.

## 2016-05-11 NOTE — [facility] Group Notes (Signed)
[facility] LCSW Group Therapy  05/11/2016 2:17 PM  Type of Therapy:  Group Therapy  Participation Level:  Did Not Attend  Modes of Intervention:  Discussion, Education, Socialization and Support  Summary of Progress/Problems:Todays topic: Grudges  Patients will be encouraged to discuss their thoughts, feelings, and behaviors as to why one holds on to grudges and reasons why people have grudges. Patients will process the impact of grudges on their daily lives and identify thoughts and feelings related to holding grudges. Patients will identify feelings and thoughts related to what life would look like without grudges.    Katleen Carraway L Nation Cradle MSW, LCSWA  05/11/2016, 2:17 PM  

## 2016-05-12 ENCOUNTER — Encounter: Payer: Self-pay | Admitting: Psychiatry

## 2016-05-12 MED ORDER — AMLODIPINE BESY-BENAZEPRIL HCL 10-40 MG PO CAPS: 1.0000 | ORAL_CAPSULE | Freq: Every day | ORAL | Status: DC

## 2016-05-12 MED ORDER — CLINDAMYCIN HCL 300 MG PO CAPS: 300.0000 mg | ORAL_CAPSULE | Freq: Three times a day (TID) | ORAL | Status: DC

## 2016-05-12 MED ORDER — HALOPERIDOL DECANOATE 100 MG/ML IM SOLN: 100.0000 mg | INTRAMUSCULAR | Status: DC

## 2016-05-12 MED ORDER — TEMAZEPAM 30 MG PO CAPS: 30.0000 mg | ORAL_CAPSULE | Freq: Every day | ORAL | Status: DC

## 2016-05-12 MED ORDER — HALOPERIDOL 10 MG PO TABS: 10.0000 mg | ORAL_TABLET | Freq: Every day | ORAL | Status: DC

## 2016-05-12 MED ORDER — FLUCONAZOLE 100 MG PO TABS: 100.0000 mg | ORAL_TABLET | Freq: Every day | ORAL | Status: DC

## 2016-05-12 NOTE — Tx Team (Signed)
Interdisciplinary Treatment Plan Update (Adult)        Date: 05/12/2016   Time Reviewed: 11:00 AM   Progress in Treatment: Improved Attending groups: Yes Participating in groups: Yes Taking medication as prescribed: Yes  Tolerating medication: Yes  Family/Significant other contact made: Yes, CSW spoke with mother, Walter Pena Patient understands diagnosis: Yes  Discussing patient identified problems/goals with staff: Yes  Medical problems stabilized or resolved: Yes  Denies suicidal/homicidal ideation: Yes  Issues/concerns per patient self-inventory: Yes  Other:   New problem(s) identified: N/A   Discharge Plan or Barriers: CSW continuing to assess, patient new to milieu.   Reason for Continuation of Hospitalization:   Depression   Anxiety   Medication Stabilization   Comments: N/A   Estimated length of stay: 3-5 days     Patient is a 35 y.o. male patient admitted with "I'm not eating much since my mother died".  HPI: Patient interviewed. Chart reviewed. Labs and vitals reviewed. Patient was a difficult historian. He made eye contact but spoke very little and a lot of what he said was in whispers. He was sent here from his group home with petitions that said that he has been refusing medicine and refusing to eat and refusing any activity for some time now. Patient on interview said that he is not doing much and has lost any interest since his mother died. I tried to follow-up about when his mother died but would not give me any more information. He admits that he is noncompliant with medicine and that he has not been eating. He denies wish to die but seems to be disorganized in his thinking and uncooperative. He was not aggressive or violent. He denies that he's been using any alcohol or drugs recently. He denies having any new physical complaints. Unclear whether he has been still receiving his long-acting injections.  Social history: Patient has been residing in a group home.  Sounds like he's been doing reasonably well for a while since his last discharge from here. The patient is claiming that his mother passed away. If so this would be clearly upsetting for him particularly since his father passed away not too long ago as well. Patient denies that he has any other family living that he is in touch with.  Substance abuse history: Patient has a history of abuse of cannabis and at times other drugs in the past when he is not in the hospital. He denies that he's recently been using.  Medical history: Hypertension history. He has a history of severe hidradenitis with infections in the groin. History of gout. History of recurrent urinary tract infection.  Past Psychiatric History: Long history of schizophrenia. Multiple hospitalizations. History of noncompliance. Denies any suicide attempts. Denies any violence. Most recently had been put on Haldol decanoate and discharged to a new living environment.   Patient lives in [place]. Patient will benefit from crisis stabilization, medication evaluation, group therapy, and psycho education in addition to case management for discharge planning. Patient and CSW reviewed pt's identified goals and treatment plan. Pt verbalized understanding and agreed to treatment plan.    Review of initial/current patient goals per problem list:  1. Goal(s): Patient will participate in aftercare plan   Met: Yes.  Target date: 3-5 days post admission date   As evidenced by: Patient will participate within aftercare plan AEB aftercare provider and housing plan at discharge being identified.   6/22: CSW assessing for appropriate contacts     06/26: Adequate for  discharge per MD. Patient will follow-up with [facility] in [place], [place].   2. Goal (s): Patient will exhibit decreased depressive symptoms and suicidal ideations.   Met: Yes Pt denies SI/HI.  Pt and MD reports he is safe for discharge.  Target date: 3-5 days post admission date   As  evidenced by: Patient will utilize self-rating of depression at 3 or below and demonstrate decreased signs of depression or be deemed stable for discharge by MD.   6/22: Goal progressing.  06/26: Patient denies SI/HI. Adequate for discharge per MD.   3. Goal(s): Patient will demonstrate decreased signs and symptoms of anxiety.   Met: Yes  Target date: 3-5 days post admission date   As evidenced by: Patient will utilize self-rating of anxiety at 3 or below and demonstrated decreased signs of anxiety, or be deemed stable for discharge by MD   6/22: Goal progressing.   06/26: Pt denies SI/HI. Adequate for discharge per MD.   4. Goal(s): Patient will demonstrate decreased signs of withdrawal due to substance abuse   Met: Yes  Target date: 3-5 days post admission date   As evidenced by: Patient will produce a CIWA/COWS score of 0, have stable vitals signs, and no symptoms of withdrawal   6/22: Patient produced a CIWA/COWS score of 0, has stable vitals signs, and no symptoms of withdrawal   06/26: Score of 0. Adequate for discharge per MD.    5. Goal(s): Patient will demonstrate decreased signs of psychosis  * Met: Yes * Target date: 3-5 days post admission date  * As evidenced by: Patient will demonstrate decreased frequency of AVH or return to baseline function   06/26: Pt denies AVH. Adequate for discharge per MD.   Attendees:  Patient: Walter Pena, Walter Pena Family:  Physician: Dr. Weber Cooks, MD     05/12/2016 11:00 AM  Nursing: Marcie Bal , RN   05/12/2016 11:00 AM  Clinical Social Worker: Glorious Peach, [place]  05/12/2016 11:00 AM  Recreational Therapist: Everitt Amber   05/12/2016 11:00 AM

## 2016-05-12 NOTE — [facility] Suicide Risk Assessment (Signed)
[facility] Discharge Suicide Risk Assessment   Principal Problem: Disorganized schizophrenia [facility] Discharge Diagnoses:  Patient Active Problem List   Diagnosis Date Noted  . 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 Noncompliance [Z91.19] 05/07/2016  . HTN (hypertension) [I10] 01/21/2016  . Cannabis use disorder, moderate, dependence (HCC) [F12.20] 01/21/2016  . Tobacco use disorder [F17.200] 01/21/2016  . Gout [M10.9] 01/21/2016  . UTI (urinary tract infection) [N39.0] 01/21/2016  . Hidradenitis [L73.2] 01/21/2016    Total Time spent with patient: 30 minutes  Musculoskeletal: Strength & Muscle Tone: within normal limits Gait & Station: normal Patient leans: N/A  Psychiatric Specialty Exam: Review of Systems  Psychiatric/Behavioral: Positive for depression.  All other systems reviewed and are negative.   Blood pressure 122/86, pulse 50, temperature 98 F (36.7 C), temperature source Oral, resp. rate 18, height 5' 9.69" (1.77 m), weight 106.142 kg (234 lb), SpO2 100 %.Body mass index is 33.88 kg/(m^2).  General Appearance: Casual  Eye Contact::  Good  Speech:  Clear and Coherent409  Volume:  Normal  Mood:  Euthymic  Affect:  Appropriate  Thought Process:  Coherent and Goal Directed  Orientation:  Full (Time, Place, and Person)  Thought Content:  WDL  Suicidal Thoughts:  No  Homicidal Thoughts:  No  Memory:  Immediate;   Fair Recent;   Fair Remote;   Fair  Judgement:  Poor  Insight:  Shallow  Psychomotor Activity:  Normal  Concentration:  Fair  Recall:  [employer] of Knowledge:Fair  Language: Fair  Akathisia:  No  Handed:  Right  AIMS (if indicated):     Assets:  Communication Skills Desire for Improvement Financial Resources/Insurance Housing Physical Health Resilience Social Support  Sleep:  Number of Hours: 5.45  Cognition: WNL  ADL's:  Intact   Mental Status Per Nursing Assessment::   On Admission:  NA (denies)  Demographic Factors:  Male and  Living alone  Loss Factors: NA  Historical Factors: Impulsivity  Risk Reduction Factors:   Sense of responsibility to family, Living with another person, especially a relative, Positive social support and Positive therapeutic relationship  Continued Clinical Symptoms:  Schizophrenia:   Depressive state Less than 36 years old Paranoid or undifferentiated type  Cognitive Features That Contribute To Risk:  None    Suicide Risk:  Minimal: No identifiable suicidal ideation.  Patients presenting with no risk factors but with morbid ruminations; may be classified as minimal risk based on the severity of the depressive symptoms  Follow-up Information    Follow up with [facility] In 2 days.   Why:  Please arrive to [facility] at 8 am to be seen. The walk-in clinic hours are between 8am-5pm M-F. If you have any questions are concerns, contact [facility].   Contact information:   [address] [place] [address] [phone]       Plan Of Care/Follow-up recommendations:  Activity:  As tolerated. Diet:  Low sodium heart healthy. Other:  Keep follow-up appointments.  Kristine Linea, MD 05/12/2016, 11:55 AM

## 2016-05-12 NOTE — Discharge Summary (Signed)
Physician Discharge Summary Note  Patient:  Walter Pena. is an 35 y.o., male MRN:  [id number] DOB:  [birth year] Patient phone:  [phone] (home)  Patient address:   [address] [place] [address],  Total Time spent with patient: 30 minutes  Date of Admission:  05/07/2016 Date of Discharge: 05/12/2016  Reason for Admission:  Psychotic break.  History of Present Illness: 35 year old man with a history of schizophrenia. Patient is not a very good historian even today. Reportedly he has been off his medicine probably for more than a month. He has been refusing to eat. Refusing to interact. Not washing or changing his clothes. Not getting out of bed. On interview the patient initially did say he was depressed but he would not elaborate. He indicated that some family members had died recently although it's hard corroborate this with his limited interaction. He has so far been cooperative with restarting medicine. Patient was complaining of having a return of infection in his groin which is confirmed. He denies suicidal intention. Denies that he's been abusing any drugs.  Associated Signs/Symptoms: Depression Symptoms: depressed mood, anhedonia, hypersomnia, psychomotor retardation, (Hypo) Manic Symptoms: Delusions, Anxiety Symptoms: Excessive Worry, Psychotic Symptoms: Hallucinations: Auditory PTSD Symptoms: Negative  Past Psychiatric History: Patient has a long history of schizophrenia. He has had a lot of trouble with noncompliance over the years. When he is on his medicine he will usually do well but he is frequently noncompliant. He has been apparently doing a little better on injectable medicine although now he is probably off of that as well. When he is not on his medicine he often becomes very withdrawn with poor self-care. I don't think we have a clear past history of suicide attempts no known history of violence.  Family psychiatric history. Nonreported.  Social history.  He lives in his own apartment but decided to move in with his mother following discharge. He is disabled from mental illness.   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 Noncompliance [Z91.19] 05/07/2016  . HTN (hypertension) [I10] 01/21/2016  . Cannabis use disorder, moderate, dependence (HCC) [F12.20] 01/21/2016  . Tobacco use disorder [F17.200] 01/21/2016  . Gout [M10.9] 01/21/2016  . UTI (urinary tract infection) [N39.0] 01/21/2016  . Hidradenitis [L73.2] 01/21/2016   Past Medical History:  Past Medical History  Diagnosis Date  . Gout   . Hypertension   . Schizophrenia (HCC)    History reviewed. No pertinent past surgical history. Family History: History reviewed. No pertinent family history.  Social History:  History  Alcohol Use  . Yes     History  Drug Use No    Social History   Social History  . Marital Status: Single    Spouse Name: N/A  . Number of Children: N/A  . Years of Education: N/A   Social History Main Topics  . Smoking status: Current Every Day Smoker -- 1.00 packs/day    Types: Cigarettes  . Smokeless tobacco: Never Used  . Alcohol Use: Yes  . Drug Use: No  . Sexual Activity: Not Asked   Other Topics Concern  . None   Social History Narrative    Hospital Course:    Walter Pena is a 35 year old male with history of schizophrenia admitted for psychotic break in the context of treatment noncompliance.  1. Psychosis. He was restarted on oral Haldol and received Haldol Decanoate on June 21.  2. Insomnia. Sleep was poor. We  increase Restoril.  3. Hypertension. He's on Lotensin and Norvasc. Medicine consult is greatly appreciated.  4. Hydradenitis. He is on clindamycin. Fluconazole was added by medicine consultant.   5. Disposition. He was discharged to home with his mother. He will follow-up with [facility].    Physical Findings: AIMS:  , ,  ,  ,    CIWA:    COWS:     Musculoskeletal: Strength & Muscle Tone: within normal limits Gait & Station: normal Patient leans: N/A  Psychiatric Specialty Exam: Physical Exam  Nursing note and vitals reviewed.   Review of Systems  Psychiatric/Behavioral: Positive for depression.  All other systems reviewed and are negative.   Blood pressure 122/86, pulse 50, temperature 98 F (36.7 C), temperature source Oral, resp. rate 18, height 5' 9.69" (1.77 m), weight 106.142 kg (234 lb), SpO2 100 %.Body mass index is 33.88 kg/(m^2).  See SRA.                                                  Sleep:  Number of Hours: 5.45     Have you used any form of tobacco in the last 30 days? (Cigarettes, Smokeless Tobacco, Cigars, and/or Pipes): Yes  Has this patient used any form of tobacco in the last 30 days? (Cigarettes, Smokeless Tobacco, Cigars, and/or Pipes) Yes, Yes, A prescription for an FDA-approved tobacco cessation medication was offered at discharge and the patient refused  Blood Alcohol level:  Lab Results  Component Value Date   [facility] <5 05/07/2016   ETH <5 01/19/2016    Metabolic Disorder Labs:  Lab Results  Component Value Date   HGBA1C 6.2* 05/08/2016   Lab Results  Component Value Date   PROLACTIN 27.2* 05/08/2016   PROLACTIN 6.9 01/19/2016   Lab Results  Component Value Date   CHOL 111 05/08/2016   TRIG 97 05/08/2016   HDL 24* 05/08/2016   CHOLHDL 4.6 05/08/2016   VLDL 19 05/08/2016   LDLCALC 68 05/08/2016   LDLCALC 82 01/23/2016    See Psychiatric Specialty Exam and Suicide Risk Assessment completed by Attending Physician prior to discharge.  Discharge destination:  Home  Is patient on multiple antipsychotic therapies at discharge:  No   Has Patient had three or more failed trials of antipsychotic monotherapy by history:  No  Recommended Plan for Multiple Antipsychotic Therapies: NA  Discharge  Instructions    Diet - low sodium heart healthy    Complete by:  As directed      Increase activity slowly    Complete by:  As directed             Medication List    STOP taking these medications        oxyCODONE-acetaminophen 5-325 MG tablet  Commonly known as:  ROXICET      TAKE these medications      Indication   amLODipine-benazepril 10-40 MG capsule  Commonly known as:  LOTREL  Take 1 capsule by mouth daily.   Indication:  High Blood Pressure     clindamycin 300 MG capsule  Commonly known as:  CLEOCIN  Take 1 capsule (300 mg total) by mouth 3 (three) times daily.   Indication:  Skin and Soft Tissue Infection     colchicine 0.6 MG tablet  1 tablet by mouth  daily until gout pain is gone.   Indication:  Gout     fluconazole 100 MG tablet  Commonly known as:  DIFLUCAN  Take 1 tablet (100 mg total) by mouth daily.   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   Indication:  Schizophrenia     haloperidol decanoate 100 MG/ML injection  Commonly known as:  HALDOL DECANOATE  Inject 1 mL (100 mg total) into the muscle every 30 (thirty) days.   Indication:  Schizophrenia     indomethacin 50 MG capsule  Commonly known as:  INDOCIN  Take 1 capsule (50 mg total) by mouth 2 (two) times daily with a meal.      temazepam 30 MG capsule  Commonly known as:  RESTORIL  Take 1 capsule (30 mg total) by mouth at bedtime.   Indication:  Trouble Sleeping           Follow-up Information    Follow up with [facility] In 2 days.   Why:  Please arrive to [facility] at 8 am to be seen. The walk-in clinic hours are between 8am-5pm M-F. If you have any questions are concerns, contact [facility].   Contact information:   [address] [place] [address] [phone]       Follow-up recommendations:  Activity:  As tolerated. Diet:  Low sodium heart  healthy. Other:  Keep follow-up appointments.  Comments:    Signed: Kristine Linea, MD 05/12/2016, 11:58 AM

## 2016-05-12 NOTE — Progress Notes (Signed)
Recreation Therapy Notes  Date: 06.26.17 Time: 9:30 am Location: Craft Room  Group Topic: Self-expression  Goal Area(s) Addresses:  Patient will effectively use art as a means of self-expression. Patient will be able to identify one emotion experienced during group session.  Behavioral Response: Attentive, Interactive  Intervention: Two Faces of Me  Activity: Patients were given a blank face worksheet and instructed to draw a line down the middle. On one side of the face, patients were instructed to draw or write how they felt when they were admitted to the hospital and on the other side they were instructed to draw or write how they want to feel when they are discharged.  Education: LRT educated patients on different forms of self-expression.  Education Outcome: In group clarification offered   Clinical Observations/Feedback: Patient completed activity by writing how he felt when he was admitted and how he wanted to feel when he is d/c. Patient contributed to group discussion by stating how his faces were different.  Jacquelynn Cree, LRT/CTRS 05/12/2016 10:41 AM

## 2016-05-12 NOTE — Progress Notes (Signed)
  [facility] Adult Case Management Discharge Plan :  Will you be returning to the same living situation after discharge:  Yes,  will live with his mother At discharge, do you have transportation home?: Yes,  mother, Tymier Arave is picking pt. up Do you have the ability to pay for your medications: Yes,  has Medicare  Release of information consent forms completed and in the chart;  Patient's signature needed at discharge.  Patient to Follow up at: Follow-up Information    Follow up with [facility] In 2 days.   Why:  Please arrive to [facility] at 8 am to be seen. The walk-in clinic hours are between 8am-5pm M-F. If you have any questions are concerns, contact [facility].   Contact information:   [address] [place] [address] [phone]       Next level of care provider has access to [facility] Link:no  Safety Planning and Suicide Prevention discussed: Yes,  reviewed SPE with mother and pt.  Have you used any form of tobacco in the last 30 days? (Cigarettes, Smokeless Tobacco, Cigars, and/or Pipes): Yes  Has patient been referred to the Quitline?: N/A patient is not a smoker  Patient has been referred for addiction treatment: N/A  Lynden Oxford 05/12/2016, 11:52 AM

## 2016-05-12 NOTE — Progress Notes (Signed)
Patient discharged home. DC instructions provided and explained. Medications reviewed. Rx given. All questions answered. Pt stable at discharge. Denies SI, HI, AVH. Belongings returned. 

## 2016-06-02 ENCOUNTER — Emergency Department
Admission: EM | Admit: 2016-06-02 | Discharge: 2016-06-02 | Disposition: A | Discharge status: Home / Self Care | Payer: Medicare Other | Attending: Emergency Medicine | Admitting: Emergency Medicine

## 2016-06-02 ENCOUNTER — Encounter: Payer: Self-pay | Admitting: Emergency Medicine

## 2016-06-02 DIAGNOSIS — F209 Schizophrenia, unspecified: Secondary | ICD-10-CM | POA: Insufficient documentation

## 2016-06-02 DIAGNOSIS — Z79899 Other long term (current) drug therapy: Secondary | ICD-10-CM | POA: Diagnosis not present

## 2016-06-02 DIAGNOSIS — I10 Essential (primary) hypertension: Secondary | ICD-10-CM | POA: Insufficient documentation

## 2016-06-02 DIAGNOSIS — F17210 Nicotine dependence, cigarettes, uncomplicated: Secondary | ICD-10-CM | POA: Diagnosis not present

## 2016-06-02 DIAGNOSIS — R369 Urethral discharge, unspecified: Secondary | ICD-10-CM | POA: Diagnosis present

## 2016-06-02 DIAGNOSIS — F1220 Cannabis dependence, uncomplicated: Secondary | ICD-10-CM | POA: Diagnosis not present

## 2016-06-02 DIAGNOSIS — A549 Gonococcal infection, unspecified: Secondary | ICD-10-CM | POA: Insufficient documentation

## 2016-06-02 DIAGNOSIS — L0591 Pilonidal cyst without abscess: Secondary | ICD-10-CM | POA: Diagnosis not present

## 2016-06-02 MED ORDER — CLINDAMYCIN HCL 150 MG PO CAPS: 450.0000 mg | ORAL_CAPSULE | Freq: Three times a day (TID) | ORAL | Status: DC

## 2016-06-02 MED ORDER — LIDOCAINE HCL (PF) 1 % IJ SOLN
INTRAMUSCULAR | Status: AC
  Filled 2016-06-02: qty 5

## 2016-06-02 MED ORDER — CEFTRIAXONE SODIUM 1 G IJ SOLR
1.0000 g | Freq: Once | INTRAMUSCULAR | Status: AC
  Administered 2016-06-02: 1 g via INTRAMUSCULAR

## 2016-06-02 MED ORDER — AZITHROMYCIN 500 MG PO TABS
2000.0000 mg | ORAL_TABLET | Freq: Once | ORAL | Status: AC
  Administered 2016-06-02: 2000 mg via ORAL

## 2016-06-02 MED ORDER — AZITHROMYCIN 1 G PO PACK: 2.0000 g | PACK | Freq: Once | ORAL | Status: DC

## 2016-06-02 MED ORDER — CEFTRIAXONE SODIUM 1 G IJ SOLR
INTRAMUSCULAR | Status: AC
  Filled 2016-06-02: qty 10

## 2016-06-02 MED ORDER — AZITHROMYCIN 250 MG PO TABS
ORAL_TABLET | ORAL | Status: AC
  Filled 2016-06-02: qty 8

## 2016-06-02 NOTE — ED Notes (Signed)
Pt states has abscess to "the crack of my butt". Pt states is draining yellow fluid. Pt states also needs follow up for STD, pt states was diagnosed with gonorrhea and did not follow up regarding std. Pt states is still having yellow drainage from penis.

## 2016-06-02 NOTE — ED Notes (Signed)
Patient reports that he developed an abscess to the top of the gluteal crevice that stared 2 days ago. Patient reports that it started draining today. Patient reports that he has had to have surgery in the past to have multiple abscess removed from his buttocks. Patient with raised hard area to the top of his gluteal crest.

## 2016-06-02 NOTE — Discharge Instructions (Signed)
Antibiotic Medicine Antibiotic medicines are used to treat infections caused by bacteria. They work by injuring or killing the bacteria that is making you sick. HOW IS AN ANTIBIOTIC CHOSEN? An antibiotic is chosen based on many factors. To help your health care provider choose one for you, tell your health care provider if:  You have any allergies.  You are pregnant or plan to get pregnant.  You are breastfeeding.  You are taking any medicines. These include over-the-counter medicines, prescription medicines, and herbal remedies.  You have a medical condition or problem you have not already discussed. Your health care provider will also consider:  How often the medicine has to be taken.  Common side effects of the medicine.  The cost of the medicine.  The taste of the medicine. If you have questions about why an antibiotic was chosen, make sure to ask. FOR HOW LONG SHOULD I TAKE MY ANTIBIOTIC? Continue to take your antibiotic for as long as told by your health care provider. Do not stop taking it when you feel better. If you stop taking it too soon:  You may start to feel sick again.  Your infection may become harder to treat.  Complications may develop. WHAT IF I MISS A DOSE? Try not to miss any doses of medicine. If you miss a dose, take it as soon as possible. However, if it is almost time for the next dose:  If you are taking 2 doses per day, take the missed dose and the next dose 5 to 6 hours apart.  If you are taking 3 or more doses per day, take the missed dose and the next dose 2 to 4 hours apart, then go back to the normal schedule. If you cannot make up a missed dose, take the next scheduled dose on time. Then take the missed dose after you have taken all the doses as recommended by your health care provider, as if you had one more dose left. DO ANTIBIOTICS AFFECT BIRTH CONTROL? Birth control pills may not work while you are on antibiotics. If you are taking birth  control pills, continue taking them as usual and use a second form of birth control, such as a condom, to avoid unwanted pregnancy. Continue using the second form of birth control until you are finished with your current 1 month cycle of birth control pills. OTHER INFORMATION  If there is any medicine left over, throw it away.  Never take someone else's antibiotics.  Never take leftover antibiotics. SEEK MEDICAL CARE IF:  You get worse.  You do not feel better within a few days of starting the antibiotic medicine.  You vomit.  White patches appear in your mouth.  You have new joint pain that begins after starting the antibiotic.  You have new muscle aches that begin after starting the antibiotic.  You had a fever before starting the antibiotic and it returns.  You have any symptoms of an allergic reaction, such as an itchy rash. If this happens, stop taking the antibiotic. SEEK IMMEDIATE MEDICAL CARE IF:  Your urine turns dark or becomes blood-colored.  Your skin turns yellow.  You bruise or bleed easily.  You have severe diarrhea and abdominal cramps.  You have a severe headache.  You have signs of a severe allergic reaction, such as:  Trouble breathing.  Wheezing.  Swelling of the lips, tongue, or face.  Fainting.  Blisters on the skin or in the mouth. If you have signs of a severe allergic  reaction, stop taking the antibiotic right away.   This information is not intended to replace advice given to you by your health care provider. Make sure you discuss any questions you have with your health care provider.   Document Released: 07/16/2004 Document Revised: 07/25/2015 Document Reviewed: 03/21/2015 Elsevier Interactive Patient Education [employer]  Gonorrhea Gonorrhea is an infection that can cause serious problems. If left untreated, the infection may:   Damage the male or male organs.   Cause women to be unable to have children (sterility).    Harm a fetus if the infected woman is pregnant.  It is important to get treatment for gonorrhea as soon as possible. It is also necessary that all your sexual partners be tested for the infection.  CAUSES  Gonorrhea is caused by bacteria called Neisseria gonorrhoeae. The infection is spread from person to person, usually by sexual contact (such as by anal, vaginal, or oral means). A newborn can contract the infection from his or her mother during birth.  RISK FACTORS  Being a woman younger than 36 years of age who is sexually active.  Being a woman 31 years of age or older who has:  A new sex partner.  More than one sex partner.  A sex partner who has a sexually transmitted disease (STD).  Using condoms inconsistently.  Currently having, or having previously had, an STD.  Exchanging sex or money or drugs. SYMPTOMS  Some people with gonorrhea do not have symptoms. Symptoms may be different in females and males.  Females The most common symptoms are:   Pain in the lower abdomen.   Fever with or without chills.  Other symptoms include:   Abnormal vaginal discharge.   Painful intercourse.   Burning or itching of the vagina or lips of the vagina.   Abnormal vaginal bleeding.   Pain when urinating.   Long-lasting (chronic) pain in the lower abdomen, especially during menstruation or intercourse.   Inability to become pregnant.   Going into premature labor.   Irritation, pain, bleeding, or discharge from the rectum. This may occur if the infection was spread by anal sex.   Sore throat or swollen lymph nodes in the neck. This may occur if the infection was spread by oral sex.  Males The most common symptoms are:   Discharge from the penis.   Pain or burning during urination.   Pain or swelling in the testicles. Other symptoms may include:   Irritation, pain, bleeding, or discharge from the rectum. This may occur if the infection was spread by  anal sex.   Sore throat, fever, or swollen lymph nodes in the neck. This may occur if the infection was spread by oral sex.  DIAGNOSIS  A diagnosis is made after a physical exam is done and a sample of discharge is examined under a microscope for the presence of the bacteria. The discharge may be taken from the urethra, cervix, throat, or rectum.  TREATMENT  Gonorrhea is treated with antibiotic medicines. It is important for treatment to begin as soon as possible. Early treatment may prevent some problems from developing. Do not have sex. Avoid all types of sexual activity for 7 days after treatment is complete and until any sex partners have been treated. HOME CARE INSTRUCTIONS   Take medicines only as directed by your health care provider.   Take your antibiotic medicine as directed by your health care provider. Finish the antibiotic even if you start to feel better. Incomplete  treatment will put you at risk for continued infection.   Do not have sex until treatment is complete or as directed by your health care provider.   Keep all follow-up visits as directed by your health care provider.   Not all test results are available during your visit. If your test results are not back during the visit, make an appointment with your health care provider to find out the results. Do not assume everything is normal if you have not heard from your health care provider or the medical facility. It is your responsibility to get your test results.  If you test positive for gonorrhea, inform your recent sexual partners. They need to be checked for gonorrhea even if they do not have symptoms. They may need treatment, even if they test negative for gonorrhea.  SEEK MEDICAL CARE IF:   You develop any bad reaction to the medicine you were prescribed. This may include:   A rash.   Nausea.   Vomiting.   Diarrhea.   Your symptoms do not improve after a few days of taking antibiotics.   Your  symptoms get worse.   You develop increased pain, such as in the testicles (for males) or in the abdomen (for females).  You have a fever. MAKE SURE YOU:   Understand these instructions.  Will watch your condition.  Will get help right away if you are not doing well or get worse.   This information is not intended to replace advice given to you by your health care provider. Make sure you discuss any questions you have with your health care provider.   Document Released: 10/31/2000 Document Revised: 11/24/2014 Document Reviewed: 05/11/2013 Elsevier Interactive Patient Education 2016 [employer]  Pilonidal Cyst A pilonidal cyst is a fluid-filled sac. It forms beneath the skin near your tailbone, at the top of the crease of your buttocks. A pilonidal cyst that is not large or infected may not cause symptoms or problems. If the cyst becomes irritated or infected, it may fill with pus. This causes pain and swelling (pilonidal abscess). An infected cyst may need to be treated with medicine, drained, or removed. CAUSES The cause of a pilonidal cyst is not known. One cause may be a hair that grows into your skin (ingrown hair). RISK FACTORS Pilonidal cysts are more common in boys and men. Risk factors include:  Having lots of hair near the crease of the buttocks.  Being overweight.  Having a pilonidal dimple.  Wearing tight clothing.  Not bathing or showering frequently.  Sitting for long periods of time. SIGNS AND SYMPTOMS Signs and symptoms of a pilonidal cyst may include:  Redness.  Pain and tenderness.  Warmth.  Swelling.  Pus.  Fever. DIAGNOSIS Your health care provider may diagnose a pilonidal cyst based on your symptoms and a physical exam. The health care provider may do a blood test to check for infection. If your cyst is draining pus, your health care provider may take a sample of the drainage to be tested at a laboratory. TREATMENT Surgery is the usual  treatment for an infected pilonidal cyst. You may also have to take medicines before surgery. The type of surgery you have depends on the size and severity of the infected cyst. The different kinds of surgery include:  Incision and drainage. This is a procedure to open and drain the cyst.  Marsupialization. In this procedure, a large cyst or abscess may be opened and kept open by stitching the edges  of the skin to the cyst walls.  Cyst removal. This procedure involves opening the skin and removing all or part of the cyst. HOME CARE INSTRUCTIONS  Follow all of your surgeon's instructions carefully if you had surgery.  Take medicines only as directed by your health care provider.  If you were prescribed an antibiotic medicine, finish it all even if you start to feel better.  Keep the area around your pilonidal cyst clean and dry.  Clean the area as directed by your health care provider. Pat the area dry with a clean towel. Do not rub it as this may cause bleeding.  Remove hair from the area around the cyst as directed by your health care provider.  Do not wear tight clothing or sit in one place for long periods of time.  There are many different ways to close and cover an incision, including stitches, skin glue, and adhesive strips. Follow your health care provider's instructions on:  Incision care.  Bandage (dressing) changes and removal.  Incision closure removal. SEEK MEDICAL CARE IF:   You have drainage, redness, swelling, or pain at the site of the cyst.  You have a fever.   This information is not intended to replace advice given to you by your health care provider. Make sure you discuss any questions you have with your health care provider.   Document Released: 10/31/2000 Document Revised: 11/24/2014 Document Reviewed: 03/23/2014 Elsevier Interactive Patient Education 2016 [employer].  Sexually Transmitted Disease A sexually transmitted disease (STD) is a disease or  infection that may be passed (transmitted) from person to person, usually during sexual activity. This may happen by way of saliva, semen, blood, vaginal mucus, or urine. Common STDs include:  Gonorrhea.  Chlamydia.  Syphilis.  HIV and AIDS.  Genital herpes.  Hepatitis B and C.  Trichomonas.  Human papillomavirus (HPV).  Pubic lice.  Scabies.  Mites.  Bacterial vaginosis. WHAT ARE CAUSES OF STDs? An STD may be caused by bacteria, a virus, or parasites. STDs are often transmitted during sexual activity if one person is infected. However, they may also be transmitted through nonsexual means. STDs may be transmitted after:   Sexual intercourse with an infected person.  Sharing sex toys with an infected person.  Sharing needles with an infected person or using unclean piercing or tattoo needles.  Having intimate contact with the genitals, mouth, or rectal areas of an infected person.  Exposure to infected fluids during birth. WHAT ARE THE SIGNS AND SYMPTOMS OF STDs? Different STDs have different symptoms. Some people may not have any symptoms. If symptoms are present, they may include:  Painful or bloody urination.  Pain in the pelvis, abdomen, vagina, anus, throat, or eyes.  A skin rash, itching, or irritation.  Growths, ulcerations, blisters, or sores in the genital and anal areas.  Abnormal vaginal discharge with or without bad odor.  Penile discharge in men.  Fever.  Pain or bleeding during sexual intercourse.  Swollen glands in the groin area.  Yellow skin and eyes (jaundice). This is seen with hepatitis.  Swollen testicles.  Infertility.  Sores and blisters in the mouth. HOW ARE STDs DIAGNOSED? To make a diagnosis, your health care provider may:  Take a medical history.  Perform a physical exam.  Take a sample of any discharge to examine.  Swab the throat, cervix, opening to the penis, rectum, or vagina for testing.  Test a sample of your  first morning urine.  Perform blood tests.  Perform  a Pap test, if this applies.  Perform a colposcopy.  Perform a laparoscopy. HOW ARE STDs TREATED? Treatment depends on the STD. Some STDs may be treated but not cured.  Chlamydia, gonorrhea, trichomonas, and syphilis can be cured with antibiotic medicine.  Genital herpes, hepatitis, and HIV can be treated, but not cured, with prescribed medicines. The medicines lessen symptoms.  Genital warts from HPV can be treated with medicine or by freezing, burning (electrocautery), or surgery. Warts may come back.  HPV cannot be cured with medicine or surgery. However, abnormal areas may be removed from the cervix, vagina, or vulva.  If your diagnosis is confirmed, your recent sexual partners need treatment. This is true even if they are symptom-free or have a negative culture or evaluation. They should not have sex until their health care providers say it is okay.  Your health care provider may test you for infection again 3 months after treatment. HOW CAN I REDUCE MY RISK OF GETTING AN STD? Take these steps to reduce your risk of getting an STD:  Use latex condoms, dental dams, and water-soluble lubricants during sexual activity. Do not use petroleum jelly or oils.  Avoid having multiple sex partners.  Do not have sex with someone who has other sex partners  Do not have sex with anyone you do not know or who is at high risk for an STD.  Avoid risky sex practices that can break your skin.  Do not have sex if you have open sores on your mouth or skin.  Avoid drinking too much alcohol or taking illegal drugs. Alcohol and drugs can affect your judgment and put you in a vulnerable position.  Avoid engaging in oral and anal sex acts.  Get vaccinated for HPV and hepatitis. If you have not received these vaccines in the past, talk to your health care provider about whether one or both might be right for you.  If you are at risk of being  infected with HIV, it is recommended that you take a prescription medicine daily to prevent HIV infection. This is called pre-exposure prophylaxis (PrEP). You are considered at risk if:  You are a man who has sex with other men (MSM).  You are a heterosexual man or woman and are sexually active with more than one partner.  You take drugs by injection.  You are sexually active with a partner who has HIV.  Talk with your health care provider about whether you are at high risk of being infected with HIV. If you choose to begin PrEP, you should first be tested for HIV. You should then be tested every 3 months for as long as you are taking PrEP. WHAT SHOULD I DO IF I THINK I HAVE AN STD?  See your health care provider.  Tell your sexual partner(s). They should be tested and treated for any STDs.  Do not have sex until your health care provider says it is okay. WHEN SHOULD I GET IMMEDIATE MEDICAL CARE? Contact your health care provider right away if:   You have severe abdominal pain.  You are a man and notice swelling or pain in your testicles.  You are a woman and notice swelling or pain in your vagina.   This information is not intended to replace advice given to you by your health care provider. Make sure you discuss any questions you have with your health care provider.   Document Released: 01/24/2003 Document Revised: 11/24/2014 Document Reviewed: 05/24/2013 Elsevier Interactive Patient Education  2016 Elsevier Inc[employer] ° °

## 2016-06-02 NOTE — ED Provider Notes (Signed)
[facility] Emergency Department Provider Note  ____________________________________________  Time seen: 7:20 AM  I have reviewed the triage vital signs and the nursing notes.   HISTORY  Chief Complaint Abscess and SEXUALLY TRANSMITTED DISEASE    HPI Walter Pena. is a 35 y.o. male who complains of pain in the superior aspect of his gluteal cleft as well as persistent penile drainage. He has a history of hidradenitis status post sweat gland excision in the perineum and groin. He reports that his been under control since then. He has had this problem with the gluteal cleft before requiring surgical cyst removal in the past. He is not sure if it was pilonidal cyst or not. Also reports being treated a month ago for gonorrhea but still having penile discharge. He denies any pain or swelling in the scrotum. Denies any sexual activity in the last few weeks.No abdominal pain nausea vomiting or diarrhea. No fever.     Past Medical History  Diagnosis Date  . Gout   . Hypertension   . Schizophrenia [facility])      Patient Active Problem List   Diagnosis Date Noted  . Disorganized schizophrenia (HCC)   . Noncompliance 05/07/2016  . HTN (hypertension) 01/21/2016  . Cannabis use disorder, moderate, dependence (HCC) 01/21/2016  . Tobacco use disorder 01/21/2016  . Gout 01/21/2016  . UTI (urinary tract infection) 01/21/2016  . Hidradenitis 01/21/2016     Past Surgical History  Procedure Laterality Date  . Sweat gland removal       Current Outpatient Rx  Name  Route  Sig  Dispense  Refill  . amLODipine-benazepril (LOTREL) 10-40 MG capsule   Oral   Take 1 capsule by mouth daily.   30 capsule   3   . clindamycin (CLEOCIN) 150 MG capsule   Oral   Take 3 capsules (450 mg total) by mouth 3 (three) times daily.   90 capsule   0   . colchicine 0.6 MG tablet      1 tablet by mouth daily until gout pain is gone.   15 tablet   0   . fluconazole  (DIFLUCAN) 100 MG tablet   Oral   Take 1 tablet (100 mg total) by mouth daily.   30 tablet   3   . haloperidol (HALDOL) 10 MG tablet   Oral   Take 1 tablet (10 mg total) by mouth at bedtime.   30 tablet   3   . haloperidol decanoate (HALDOL DECANOATE) 100 MG/ML injection   Intramuscular   Inject 1 mL (100 mg total) into the muscle every 30 (thirty) days.   1 mL   1   . indomethacin (INDOCIN) 50 MG capsule   Oral   Take 1 capsule (50 mg total) by mouth 2 (two) times daily with a meal.   30 capsule   0   . temazepam (RESTORIL) 30 MG capsule   Oral   Take 1 capsule (30 mg total) by mouth at bedtime.   30 capsule   0      Allergies Bactrim and Lisinopril   No family history on file.  Social History Social History  Substance Use Topics  . Smoking status: Current Every Day Smoker -- 1.00 packs/day    Types: Cigarettes  . Smokeless tobacco: Never Used  . Alcohol Use: Yes    Review of Systems  Constitutional:   No fever or chills.  ENT:   No sore throat. No rhinorrhea. Cardiovascular:  No chest pain. Respiratory:   No dyspnea or cough. Gastrointestinal:   Negative for abdominal pain, vomiting and diarrhea.  Genitourinary:   Positive penile discharge. No dysuria frequency urgency or scrotal pain.. Musculoskeletal:   Swelling at the superior aspect of the gluteal cleft with some purulent drainage Neurological:   Negative for headaches 10-point ROS otherwise negative.  ____________________________________________   PHYSICAL EXAM:  VITAL SIGNS: ED Triage Vitals  Enc Vitals Group     BP 06/02/16 0344 116/83 mmHg     Pulse Rate 06/02/16 0344 84     Resp 06/02/16 0344 16     Temp 06/02/16 0344 98.5 F (36.9 C)     Temp Source 06/02/16 0344 Oral     SpO2 06/02/16 0344 100 %     Weight 06/02/16 0344 230 lb (104.327 kg)     Height 06/02/16 0344 5\' 8"  (1.727 m)     Head Cir --      Peak Flow --      Pain Score 06/02/16 0345 9     Pain Loc --      Pain  Edu? --      Excl. in GC? --     Vital signs reviewed, nursing assessments reviewed.   Constitutional:   Alert and oriented. Well appearing and in no distress. Eyes:   No scleral icterus. No conjunctival pallor. PERRL. EOMI.   ENT   Head:   Normocephalic and atraumatic.      Mouth/Throat:   MMM, no pharyngeal erythema. No peritonsillar mass.    Neck:   No stridor. No SubQ emphysema. No meningismus. Hematological/Lymphatic/Immunilogical:   No cervical lymphadenopathy. Cardiovascular:   RRR. Symmetric bilateral radial and DP pulses.  No murmurs.  Respiratory:   Normal respiratory effort without tachypnea nor retractions. Breath sounds are clear and equal bilaterally. No wheezes/rales/rhonchi. Gastrointestinal:   Soft and nontender. Non distended. There is no CVA tenderness.  No rebound, rigidity, or guarding. Genitourinary:   Scarred groin and perineum skin but without tenderness and induration or drainage around the genitalia. Scrotum is nontender nonswollen. No epididymal tenderness. No obvious infectious or acute lesions. There is a scant amount of white discharge that can be expressed from the penile shaft at the meatus without tenderness. Musculoskeletal:   Nontender with normal range of motion in all extremities. No joint effusions.  No lower extremity tenderness.  No edema. Neurologic:   Normal speech and language.  CN 2-10 normal. Motor grossly intact. No gross focal neurologic deficits are appreciated.  Skin:    Indurated tender area at the superior most aspect of the gluteal cleft. Applying pressure to the area expresses a small amount of purulent fluid from the edge of the area. It is not fluctuant, no significant erythema or warmth. No lymphangitis or crepitus.. Area does not involve the perirectal space  ____________________________________________    LABS (pertinent positives/negatives) (all labs ordered are listed, but only abnormal results are displayed) Labs  Reviewed - No data to display ____________________________________________   EKG    ____________________________________________    RADIOLOGY    ____________________________________________   PROCEDURES   ____________________________________________   INITIAL IMPRESSION / ASSESSMENT AND PLAN / ED COURSE  Pertinent labs & imaging results that were available during my care of the patient were reviewed by me and considered in my medical decision making (see chart for details).  Patient well appearing no acute distress. Vital signs unremarkable. Presents with 2 problems. First is persistent penile discharge after recent treatment for gonorrhea.  I was unable to verify what medication or what dose he was given, so I will treat him with high-dose ceftriaxone 1 g intramuscular as well as high dose azithromycin 2 g by mouth. This should provide adequate coverage for the gonorrhea as well as any co-infecting chlamydia. He was negative for chlamydia when tested, but this was by urine screen which can be unreliable.  Second issue is an infected pilonidal cyst. There is not a large abscess collection that would benefit from incision and drainage, and it is spontaneously draining. Heat therapy, clindamycin. Patient is very well-appearing. Follow-up with primary care. No evidence of any deep space abscess or soft tissue infection or osteomyelitis. Low suspicion for epididymitis prostatitis.     ____________________________________________   FINAL CLINICAL IMPRESSION(S) / ED DIAGNOSES  Final diagnoses:  Infected pilonidal cyst  Gonorrhea in male       Portions of this note were generated with dragon dictation software. Dictation errors may occur despite best attempts at proofreading.   Sharman Cheek, MD 06/02/16 [phone]

## 2016-06-24 ENCOUNTER — Telehealth: Payer: Self-pay | Admitting: Emergency Medicine

## 2016-06-24 ENCOUNTER — Other Ambulatory Visit
Admission: EM | Admit: 2016-06-24 | Discharge: 2016-06-24 | Disposition: A | Discharge status: Home / Self Care | Payer: Medicare Other | Attending: Family Medicine | Admitting: Family Medicine
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
## 2016-08-04 ENCOUNTER — Encounter: Payer: Self-pay | Admitting: *Deleted

## 2016-08-21 ENCOUNTER — Ambulatory Visit: Payer: Medicare Other | Admitting: General Surgery

## 2016-08-26 ENCOUNTER — Encounter: Payer: Self-pay | Admitting: *Deleted

## 2016-11-24 ENCOUNTER — Encounter: Payer: Self-pay | Admitting: *Deleted

## 2016-12-01 ENCOUNTER — Ambulatory Visit: Payer: Medicare Other | Admitting: General Surgery

## 2016-12-09 ENCOUNTER — Encounter: Payer: Self-pay | Admitting: General Surgery

## 2016-12-09 ENCOUNTER — Ambulatory Visit (INDEPENDENT_AMBULATORY_CARE_PROVIDER_SITE_OTHER): Payer: Medicare Other | Admitting: General Surgery

## 2016-12-09 VITALS — BP 158/94 | HR 88 | Resp 12 | Ht 68.0 in | Wt 248.0 lb

## 2016-12-09 DIAGNOSIS — L732 Hidradenitis suppurativa: Secondary | ICD-10-CM

## 2016-12-09 NOTE — Progress Notes (Signed)
Patient ID: Walter Pena., male   DOB: 01-21-1981, 36 y.o.   MRN: [id number]  Chief Complaint  Patient presents with  . Rectal Problems    HPI Walter Pena. is a 36 y.o. male.  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 Ellsworth Lennox. He states he has a boil that has had surgery on it 4 years ago. He said it "flared back up" about 8 months ago. Drainage is daily. He does have some pain that seems to be getting worse. Bowels move daily. He is here today with his girlfriend, [name]. I have reviewed the history of present illness with the patient.  HPI  Past Medical History:  Diagnosis Date  . Gout   . Hypertension   . Schizophrenia [facility])     Past Surgical History:  Procedure Laterality Date  . sweat gland removal      History reviewed. No pertinent family history.  Social History Social History  Substance Use Topics  . Smoking status: Current Every Day Smoker    Packs/day: 1.00    Years: 9.00    Types: Cigarettes  . Smokeless tobacco: Never Used  . Alcohol use Yes    Allergies  Allergen Reactions  . Bactrim [Sulfamethoxazole-Trimethoprim] Nausea And Vomiting  . Lisinopril Swelling  . Other Rash    aloe    Current Outpatient Prescriptions  Medication Sig Dispense Refill  . amLODipine-benazepril (LOTREL) 10-40 MG capsule Take 1 capsule by mouth daily. 30 capsule 3  . benztropine (COGENTIN) 1 MG tablet Take 1 mg by mouth daily.    . colchicine 0.6 MG tablet 1 tablet by mouth daily until gout pain is gone. 15 tablet 0  . doxycycline (VIBRA-TABS) 100 MG tablet Take 100 mg by mouth daily.     . haloperidol (HALDOL) 10 MG tablet Take 1 tablet (10 mg total) by mouth at bedtime. 30 tablet 3  . temazepam (RESTORIL) 30 MG capsule Take 1 capsule (30 mg total) by mouth at bedtime. 30 capsule 0   No current facility-administered medications for this visit.     Review of Systems Review of Systems  Constitutional:  Negative.   Respiratory: Negative.   Cardiovascular: Negative.     Blood pressure (!) 158/94, pulse 88, resp. rate 12, height 5\' 8"  (1.727 m), weight 248 lb (112.5 kg).  Physical Exam Physical Exam  Constitutional: He is oriented to person, place, and time. He appears well-developed and well-nourished.  HENT:  Mouth/Throat: Oropharynx is clear and moist.  Eyes: Conjunctivae are normal. No scleral icterus.  Neck: Neck supple.  Cardiovascular: Normal rate, regular rhythm and normal heart sounds.   Pulmonary/Chest: Effort normal and breath sounds normal.  Abdominal: Soft. There is no tenderness.  Genitourinary:  Genitourinary Comments: Extensive  areas of hidradenitis located along the perineum, perianal and gluteal cleft. There are multiple areas of drops of pus. Whole process extends along inner thigh and groin area.  Lymphadenopathy:    He has no cervical adenopathy.    He has no axillary adenopathy.       Right: No inguinal adenopathy present.       Left: No inguinal adenopathy present.  Neurological: He is alert and oriented to person, place, and time.  Skin: Skin is warm and dry.  Healed areas of hidradenitis in both axillae  Psychiatric: His behavior is normal.    Data Reviewed Labs and recent note from PCP Assessment    Hidradenitis of  the buttocks and perianal region. Extensive in nature with active areas of drainage.    Plan    Patient advised that involved skin is very extensive and excision would involve    plastic surgery involvement and possible colostomy. Will try a course of low dose antibiotic for 6-[redacted] weeks along with diligent local hygiene. Patient was agreeable.  Continue Doxycycline 100 mg daily for now Culture sent   Warm tub soaks Follow up in 6 weeks      This information has been scribed by Dorathy Daft RN, BSN,BC.   Jovie Swanner G 12/09/2016, 4:00 PM

## 2016-12-09 NOTE — Patient Instructions (Addendum)
The patient is aware to call back for any questions or concerns. Keep area clean and dry Warm tub soaks Follow up in 6 weeks.

## 2016-12-13 LAB — ANAEROBIC AND AEROBIC CULTURE

## 2017-01-08 ENCOUNTER — Emergency Department: Payer: Medicare Other

## 2017-01-08 ENCOUNTER — Observation Stay
Admission: EM | Admit: 2017-01-08 | Discharge: 2017-01-08 | Disposition: A | Discharge status: Home / Self Care | Payer: Medicare Other | Attending: Internal Medicine | Admitting: Internal Medicine

## 2017-01-08 DIAGNOSIS — N442 Benign cyst of testis: Secondary | ICD-10-CM | POA: Diagnosis not present

## 2017-01-08 DIAGNOSIS — L732 Hidradenitis suppurativa: Secondary | ICD-10-CM | POA: Insufficient documentation

## 2017-01-08 DIAGNOSIS — A749 Chlamydial infection, unspecified: Secondary | ICD-10-CM | POA: Insufficient documentation

## 2017-01-08 DIAGNOSIS — R109 Unspecified abdominal pain: Secondary | ICD-10-CM | POA: Diagnosis not present

## 2017-01-08 DIAGNOSIS — I10 Essential (primary) hypertension: Secondary | ICD-10-CM | POA: Insufficient documentation

## 2017-01-08 DIAGNOSIS — N492 Inflammatory disorders of scrotum: Secondary | ICD-10-CM | POA: Insufficient documentation

## 2017-01-08 DIAGNOSIS — M109 Gout, unspecified: Secondary | ICD-10-CM | POA: Insufficient documentation

## 2017-01-08 DIAGNOSIS — F201 Disorganized schizophrenia: Secondary | ICD-10-CM | POA: Diagnosis not present

## 2017-01-08 DIAGNOSIS — Z79899 Other long term (current) drug therapy: Secondary | ICD-10-CM | POA: Insufficient documentation

## 2017-01-08 DIAGNOSIS — N433 Hydrocele, unspecified: Secondary | ICD-10-CM | POA: Diagnosis not present

## 2017-01-08 DIAGNOSIS — N50819 Testicular pain, unspecified: Secondary | ICD-10-CM | POA: Diagnosis present

## 2017-01-08 DIAGNOSIS — N5089 Other specified disorders of the male genital organs: Secondary | ICD-10-CM | POA: Diagnosis not present

## 2017-01-08 DIAGNOSIS — F17210 Nicotine dependence, cigarettes, uncomplicated: Secondary | ICD-10-CM | POA: Insufficient documentation

## 2017-01-08 HISTORY — DX: Hidradenitis suppurativa: L73.2

## 2017-01-08 LAB — RAPID HIV SCREEN (HIV 1/2 AB+AG)
HIV 1/2 Antibodies: NONREACTIVE
HIV-1 P24 Antigen - HIV24: NONREACTIVE

## 2017-01-08 LAB — COMPREHENSIVE METABOLIC PANEL
ALK PHOS: 67 U/L (ref 38–126)
ALT: 24 U/L (ref 17–63)
ANION GAP: 6 (ref 5–15)
AST: 24 U/L (ref 15–41)
Albumin: 3.5 g/dL (ref 3.5–5.0)
BUN: 11 mg/dL (ref 6–20)
CALCIUM: 8.7 mg/dL — AB (ref 8.9–10.3)
CO2: 27 mmol/L (ref 22–32)
CREATININE: 1.06 mg/dL (ref 0.61–1.24)
Chloride: 105 mmol/L (ref 101–111)
Glucose, Bld: 135 mg/dL — ABNORMAL HIGH (ref 65–99)
Potassium: 3.4 mmol/L — ABNORMAL LOW (ref 3.5–5.1)
Sodium: 138 mmol/L (ref 135–145)
Total Bilirubin: 0.3 mg/dL (ref 0.3–1.2)
Total Protein: 8.2 g/dL — ABNORMAL HIGH (ref 6.5–8.1)

## 2017-01-08 LAB — URINALYSIS, COMPLETE (UACMP) WITH MICROSCOPIC
BILIRUBIN URINE: NEGATIVE
Glucose, UA: NEGATIVE mg/dL
HGB URINE DIPSTICK: NEGATIVE
KETONES UR: NEGATIVE mg/dL
Nitrite: NEGATIVE
Protein, ur: NEGATIVE mg/dL
Specific Gravity, Urine: 1.001 — ABNORMAL LOW (ref 1.005–1.030)
pH: 6 (ref 5.0–8.0)

## 2017-01-08 LAB — CHLAMYDIA/NGC RT PCR (ARMC ONLY)
Chlamydia Tr: DETECTED — AB
N gonorrhoeae: NOT DETECTED

## 2017-01-08 LAB — CBC
HCT: 40.2 % (ref 40.0–52.0)
HEMOGLOBIN: 13.8 g/dL (ref 13.0–18.0)
MCH: 29.7 pg (ref 26.0–34.0)
MCHC: 34.3 g/dL (ref 32.0–36.0)
MCV: 86.5 fL (ref 80.0–100.0)
PLATELETS: 330 10*3/uL (ref 150–440)
RBC: 4.64 MIL/uL (ref 4.40–5.90)
RDW: 14.9 % — ABNORMAL HIGH (ref 11.5–14.5)
WBC: 12.5 10*3/uL — ABNORMAL HIGH (ref 3.8–10.6)

## 2017-01-08 LAB — TSH: TSH: 0.567 u[IU]/mL (ref 0.350–4.500)

## 2017-01-08 MED ORDER — AMOXICILLIN-POT CLAVULANATE 875-125 MG PO TABS: 1.0000 | ORAL_TABLET | Freq: Two times a day (BID) | ORAL | 0 refills | Status: AC

## 2017-01-08 MED ORDER — AMLODIPINE BESYLATE 10 MG PO TABS
10.0000 mg | ORAL_TABLET | Freq: Every day | ORAL | Status: DC
  Administered 2017-01-08: 10 mg via ORAL
  Filled 2017-01-08: qty 2

## 2017-01-08 MED ORDER — AZITHROMYCIN 1 G PO PACK
1.0000 g | PACK | Freq: Once | ORAL | Status: AC
  Administered 2017-01-08: 1 g via ORAL
  Filled 2017-01-08: qty 1

## 2017-01-08 MED ORDER — DOCUSATE SODIUM 100 MG PO CAPS
100.0000 mg | ORAL_CAPSULE | Freq: Two times a day (BID) | ORAL | Status: DC
  Administered 2017-01-08: 100 mg via ORAL
  Filled 2017-01-08: qty 1

## 2017-01-08 MED ORDER — ACETAMINOPHEN 650 MG RE SUPP: 650.0000 mg | Freq: Four times a day (QID) | RECTAL | Status: DC | PRN

## 2017-01-08 MED ORDER — HALOPERIDOL 2 MG PO TABS: 10.0000 mg | ORAL_TABLET | Freq: Every day | ORAL | Status: DC

## 2017-01-08 MED ORDER — DOXYCYCLINE HYCLATE 100 MG PO TABS: 100.0000 mg | ORAL_TABLET | Freq: Two times a day (BID) | ORAL | 0 refills | Status: DC

## 2017-01-08 MED ORDER — ONDANSETRON HCL 4 MG PO TABS: 4.0000 mg | ORAL_TABLET | Freq: Four times a day (QID) | ORAL | Status: DC | PRN

## 2017-01-08 MED ORDER — ENOXAPARIN SODIUM 40 MG/0.4ML ~~LOC~~ SOLN: 40.0000 mg | SUBCUTANEOUS | Status: DC

## 2017-01-08 MED ORDER — BENAZEPRIL HCL 20 MG PO TABS
40.0000 mg | ORAL_TABLET | Freq: Every day | ORAL | Status: DC
  Administered 2017-01-08: 40 mg via ORAL
  Filled 2017-01-08: qty 2

## 2017-01-08 MED ORDER — BENZTROPINE MESYLATE 1 MG PO TABS
1.0000 mg | ORAL_TABLET | Freq: Every day | ORAL | Status: DC
  Administered 2017-01-08: 1 mg via ORAL
  Filled 2017-01-08: qty 1

## 2017-01-08 MED ORDER — LABETALOL HCL 5 MG/ML IV SOLN
5.0000 mg | INTRAVENOUS | Status: DC | PRN
  Administered 2017-01-08: 5 mg via INTRAVENOUS
  Filled 2017-01-08: qty 4

## 2017-01-08 MED ORDER — PIPERACILLIN-TAZOBACTAM 3.375 G IVPB 30 MIN
3.3750 g | Freq: Once | INTRAVENOUS | Status: AC
  Administered 2017-01-08: 3.375 g via INTRAVENOUS
  Filled 2017-01-08: qty 50

## 2017-01-08 MED ORDER — ONDANSETRON HCL 4 MG/2ML IJ SOLN: 4.0000 mg | Freq: Four times a day (QID) | INTRAMUSCULAR | Status: DC | PRN

## 2017-01-08 MED ORDER — ACETAMINOPHEN 325 MG PO TABS: 650.0000 mg | ORAL_TABLET | Freq: Four times a day (QID) | ORAL | Status: DC | PRN

## 2017-01-08 MED ORDER — DEXTROSE 5 % IV SOLN
2.0000 g | INTRAVENOUS | Status: DC
  Administered 2017-01-08: 2 g via INTRAVENOUS
  Filled 2017-01-08: qty 2

## 2017-01-08 MED ORDER — IOPAMIDOL (ISOVUE-300) INJECTION 61%
100.0000 mL | Freq: Once | INTRAVENOUS | Status: AC | PRN
  Administered 2017-01-08: 100 mL via INTRAVENOUS

## 2017-01-08 MED ORDER — TEMAZEPAM 15 MG PO CAPS: 30.0000 mg | ORAL_CAPSULE | Freq: Every day | ORAL | Status: DC

## 2017-01-08 NOTE — ED Notes (Signed)
Dr Manson Passey at bedside to discuss result findings; pt voices understanding; SO at bedside

## 2017-01-08 NOTE — Progress Notes (Signed)
ID E note REviewed chart and imaging and labs. He has Chlamydia + but that does not cause a scrotal celluliits. Has received azithro in ED and ceftraixone since admission so covered for the Chlamydia  For the scrotal cellulitis would cover for the usual suspects with augmentin and doxy for 10 days and FU with PCP

## 2017-01-08 NOTE — Discharge Summary (Signed)
[facility] - [place] at [facility]   PATIENT NAME: Walter Pena    MR#:  [id number]  DATE OF BIRTH:  1981/01/24  DATE OF ADMISSION:  01/08/2017 ADMITTING PHYSICIAN: Arnaldo Natal, MD  DATE OF DISCHARGE:  PRIMARY CARE PHYSICIAN: Luna Fuse, MD    ADMISSION DIAGNOSIS:  Testicular swelling [N50.89]  DISCHARGE DIAGNOSIS:  Active Problems:   Scrotal swelling  Chlamydia infection in urine.  SECONDARY DIAGNOSIS:   Past Medical History:  Diagnosis Date  . Gout   . Hidradenitis   . Hypertension   . Schizophrenia [facility])     HOSPITAL COURSE:   presents emergency department complaining of testicular pain. He states that his genitals have been swelling over the last few days. He denies fevers, nausea, vomiting or diarrhea. He also denies penile discharge. Laboratory evaluation in the emergency department revealed leukocytosis as well as urine positive for Chlamydia infection. CT of the abdomen and pelvis shows disorganized subcutaneous fluid collection likely representing a pilonidal sinus tract infection or inflammation. Surgery was consulted who states the patient is not a surgical candidate at this time and recommended IV antibiotics for which the emergency department staff contacted the hospitalist service. Pt was feeling fine, no pain or fever. Discussed his case and results with ID physician. He suggested to give oral doxy and augmentin for 10 days.  DISCHARGE CONDITIONS:   Stable.  CONSULTS OBTAINED:  Treatment Team:  Mick Sell, MD  DRUG ALLERGIES:   Allergies  Allergen Reactions  . Bactrim [Sulfamethoxazole-Trimethoprim] Nausea And Vomiting  . Lisinopril Swelling  . Other Rash    aloe    DISCHARGE MEDICATIONS:   Current Discharge Medication List    START taking these medications   Details  amoxicillin-clavulanate (AUGMENTIN) 875-125 MG tablet Take 1 tablet by mouth 2 (two) times daily. Qty: 20 tablet,  Refills: 0      CONTINUE these medications which have CHANGED   Details  doxycycline (VIBRA-TABS) 100 MG tablet Take 1 tablet (100 mg total) by mouth 2 (two) times daily. Qty: 20 tablet, Refills: 0      CONTINUE these medications which have NOT CHANGED   Details  amLODipine-benazepril (LOTREL) 10-40 MG capsule Take 1 capsule by mouth daily. Qty: 30 capsule, Refills: 3    benztropine (COGENTIN) 1 MG tablet Take 1 mg by mouth daily.    colchicine 0.6 MG tablet 1 tablet by mouth daily until gout pain is gone. Qty: 15 tablet, Refills: 0    haloperidol (HALDOL) 10 MG tablet Take 1 tablet (10 mg total) by mouth at bedtime. Qty: 30 tablet, Refills: 3    temazepam (RESTORIL) 30 MG capsule Take 1 capsule (30 mg total) by mouth at bedtime. Qty: 30 capsule, Refills: 0         DISCHARGE INSTRUCTIONS:    Follow with PMD in 10 days.  If you experience worsening of your admission symptoms, develop shortness of breath, life threatening emergency, suicidal or homicidal thoughts you must seek medical attention immediately by calling 911 or calling your MD immediately  if symptoms less severe.  You Must read complete instructions/literature along with all the possible adverse reactions/side effects for all the Medicines you take and that have been prescribed to you. Take any new Medicines after you have completely understood and accept all the possible adverse reactions/side effects.   Please note  You were cared for by a hospitalist during your hospital stay. If you have any questions about your discharge medications or  the care you received while you were in the hospital after you are discharged, you can call the unit and asked to speak with the hospitalist on call if the hospitalist that took care of you is not available. Once you are discharged, your primary care physician will handle any further medical issues. Please note that NO REFILLS for any discharge medications will be authorized  once you are discharged, as it is imperative that you return to your primary care physician (or establish a relationship with a primary care physician if you do not have one) for your aftercare needs so that they can reassess your need for medications and monitor your lab values.    Today   CHIEF COMPLAINT:   Chief Complaint  Patient presents with  . Testicle Pain    HISTORY OF PRESENT ILLNESS:  Walter Pena  is a 36 y.o. male presents emergency department complaining of testicular pain. He states that his genitals have been swelling over the last few days. He denies fevers, nausea, vomiting or diarrhea. He also denies penile discharge. Laboratory evaluation in the emergency department revealed leukocytosis as well as urine positive for Chlamydia infection. CT of the abdomen and pelvis shows disorganized subcutaneous fluid collection likely representing a pilonidal sinus tract infection or inflammation. Surgery was consulted who states the patient is not a surgical candidate at this time and recommended IV antibiotics for which the emergency department staff contacted the hospitalist service.  [place] to quit smoking for 4 min.  VITAL SIGNS:  Blood pressure 132/80, pulse 91, temperature 98.1 F (36.7 C), temperature source Oral, resp. rate 20, height 5\' 8"  (1.727 m), weight 113.4 kg (250 lb), SpO2 100 %.  I/O:   Intake/Output Summary (Last 24 hours) at 01/08/17 1406 Last data filed at 01/08/17 1345  Gross per 24 hour  Intake              290 ml  Output                0 ml  Net              290 ml    PHYSICAL EXAMINATION:   Vitals reviewed. Constitutional: He is oriented to person, place, and time. He appears well-developed and well-nourished. No distress.  HENT:  Head: Normocephalic and atraumatic.  Mouth/Throat: Oropharynx is clear and moist.  Eyes: Conjunctivae and EOM are normal. Pupils are equal, round, and reactive to light. No scleral icterus.  Neck: Normal range of  motion. Neck supple. No JVD present. No tracheal deviation present. No thyromegaly present.  Cardiovascular: Normal rate, regular rhythm and normal heart sounds.  Exam reveals no gallop and no friction rub.   No murmur heard. Respiratory: Effort normal and breath sounds normal. No respiratory distress.  GI: Soft. Bowel sounds are normal. He exhibits no distension and no mass. There is tenderness. There is no rebound and no guarding.  Genitourinary:     Genitourinary Comments: Scrotal swelling  Musculoskeletal: Normal range of motion. He exhibits no edema.  Lymphadenopathy:    He has no cervical adenopathy.  Neurological: He is alert and oriented to person, place, and time. No cranial nerve deficit.  Skin: Skin is warm and dry. No erythema.  Enumerable areas of scarred cystic lesions all over body  Psychiatric: He has a normal mood and affect. His behavior is normal. Judgment and thought content normal.      DATA REVIEW:   CBC  Recent Labs Lab 01/08/17 0311  WBC  12.5*  HGB 13.8  HCT 40.2  PLT 330    Chemistries   Recent Labs Lab 01/08/17 0311  NA 138  K 3.4*  CL 105  CO2 27  GLUCOSE 135*  BUN 11  CREATININE 1.06  CALCIUM 8.7*  AST 24  ALT 24  ALKPHOS 67  BILITOT 0.3    Cardiac Enzymes No results for input(s): TROPONINI in the last 168 hours.  Microbiology Results  Results for orders placed or performed during the hospital encounter of 01/08/17  Chlamydia/NGC rt PCR     Status: Abnormal   Collection Time: 01/08/17  2:25 AM  Result Value Ref Range Status   Specimen source GC/Chlam URINE, RANDOM  Final   Chlamydia Tr DETECTED (A) NOT DETECTED Final   N gonorrhoeae NOT DETECTED NOT DETECTED Final    Comment: (NOTE) 100  This methodology has not been evaluated in pregnant women or in 200  patients with a history of hysterectomy. 300 400  This methodology will not be performed on patients less than 84  years of age.     RADIOLOGY:  US  Scrotum  Result Date: 01/08/2017 CLINICAL DATA:  Bilateral testicle swelling and pain EXAM: SCROTAL ULTRASOUND DOPPLER ULTRASOUND OF THE TESTICLES TECHNIQUE: Complete ultrasound examination of the testicles, epididymis, and other scrotal structures was performed. Color and spectral Doppler ultrasound were also utilized to evaluate blood flow to the testicles. COMPARISON:  None. FINDINGS: Right testicle Measurements: 4 x 2.4 x 2.9 cm. No mass or microlithiasis visualized. Left testicle Measurements: 3.8 x 2.5 x 2.8 cm. Small cyst anteriorly measuring 0.8 x 0.5 x 0.7 cm. Right epididymis:  Normal in size and appearance. Left epididymis:  Normal in size and appearance. Hydrocele:  Small bilateral hydroceles. Varicocele:  None visualized. Pulsed Doppler interrogation of both testes demonstrates normal low resistance arterial and venous waveforms bilaterally. Marked edema and scrotal wall thickening. IMPRESSION: 1. No sonographic evidence for testicular torsion 2. Small bilateral hydroceles 3. Small left testicular cyst 4. Marked scrotal edema Electronically Signed   By: Jasmine Pang M.D.   On: 01/08/2017 02:23   Ct Abdomen Pelvis W Contrast  Result Date: 01/08/2017 CLINICAL DATA:  36 year old male with right flank and testicular pain. EXAM: CT ABDOMEN AND PELVIS WITH CONTRAST TECHNIQUE: Multidetector CT imaging of the abdomen and pelvis was performed using the standard protocol following bolus administration of intravenous contrast. CONTRAST:  ISOVUE-300 IOPAMIDOL (ISOVUE-300) INJECTION 61% COMPARISON:  Testicular ultrasound dated 01/08/2017 FINDINGS: Lower chest: The visualized lung bases are clear. No intra-abdominal free air or free fluid. Hepatobiliary: No focal liver abnormality is seen. No gallstones, gallbladder wall thickening, or biliary dilatation. Pancreas: Unremarkable. No pancreatic ductal dilatation or surrounding inflammatory changes. Spleen: Normal in size without focal abnormality.  Adrenals/Urinary Tract: Adrenal glands are unremarkable. Kidneys are normal, without renal calculi, focal lesion, or hydronephrosis. Bladder is unremarkable. Stomach/Bowel: There is moderate stool throughout the colon. No evidence of bowel obstruction or active inflammation. Normal appendix. Vascular/Lymphatic: The abdominal aorta and IVC appear unremarkable. The SMV, splenic vein, and main portal vein are patent. No portal venous gas identified. There is no adenopathy. Reproductive: The prostate and seminal vesicles are grossly unremarkable. Other: Small bilateral hydroceles and scrotal edema. There is thickening of the skin along the gluteal fissure. A small fluid containing tract noted in the subcutaneous soft tissues extending from the skin at the level of S2 to the perianal region likely representing a pilonidal sinus tract with mild infection/inflammation. Correlation with clinical exam recommended.  Musculoskeletal: No acute or significant osseous findings. IMPRESSION: 1.  No acute intra- abdominal or pelvic pathology. 2. Fluid containing subcutaneous tract extending from the skin in the upper gluteal region to the perianal area likely representing a pilonidal sinus tract with associated mild inflammation/infection. Clinical correlation is recommended. 3. Small bilateral hydroceles and mild scrotal edema. Electronically Signed   By: Elgie Collard M.D.   On: 01/08/2017 04:20   [place] Art/ven Flow Abd Pelv Doppler  Result Date: 01/08/2017 CLINICAL DATA:  Bilateral testicle swelling and pain EXAM: SCROTAL ULTRASOUND DOPPLER ULTRASOUND OF THE TESTICLES TECHNIQUE: Complete ultrasound examination of the testicles, epididymis, and other scrotal structures was performed. Color and spectral Doppler ultrasound were also utilized to evaluate blood flow to the testicles. COMPARISON:  None. FINDINGS: Right testicle Measurements: 4 x 2.4 x 2.9 cm. No mass or microlithiasis visualized. Left testicle Measurements: 3.8 x  2.5 x 2.8 cm. Small cyst anteriorly measuring 0.8 x 0.5 x 0.7 cm. Right epididymis:  Normal in size and appearance. Left epididymis:  Normal in size and appearance. Hydrocele:  Small bilateral hydroceles. Varicocele:  None visualized. Pulsed Doppler interrogation of both testes demonstrates normal low resistance arterial and venous waveforms bilaterally. Marked edema and scrotal wall thickening. IMPRESSION: 1. No sonographic evidence for testicular torsion 2. Small bilateral hydroceles 3. Small left testicular cyst 4. Marked scrotal edema Electronically Signed   By: Jasmine Pang M.D.   On: 01/08/2017 02:23    EKG:   Orders placed or performed in visit on 03/06/13  . EKG 12-Lead      Management plans discussed with the patient, family and they are in agreement.  CODE STATUS:     Code Status Orders        Start     Ordered   01/08/17 0906  Full code  Continuous     01/08/17 0905    Code Status History    Date Active Date Inactive Code Status Order ID Comments User Context   05/07/2016  9:20 PM 05/12/2016  4:13 PM Full Code [id number]  Audery Amel, MD Inpatient   01/20/2016 12:42 AM 01/24/2016  6:09 PM Full Code [id number]  Brandy Hale, MD Inpatient      TOTAL TIME TAKING CARE OF THIS PATIENT: 35 minutes.    Altamese Dilling M.D on 01/08/2017 at 2:06 PM  Between 7am to 6pm - Pager - [phone]  After 6pm go to [email or web address] - password [email or web address]  [facility]  Office  [phone]  CC: Primary care physician; Luna Fuse, MD   Note: This dictation was prepared with Dragon dictation along with smaller phrase technology. Any transcriptional errors that result from this process are unintentional.

## 2017-01-08 NOTE — ED Provider Notes (Signed)
[facility] Emergency Department Provider Note   First MD Initiated Contact with Patient 01/08/17 0250     (approximate)  I have reviewed the triage vital signs and the nursing notes.   HISTORY  Chief Complaint Testicle Pain   HPI Walter Pena. is a 36 y.o. male with bolus of chronic medical conditions including hidradenitis presents to the emergency department with acute onset of scrotal and penis swelling tonight. Patient states when he went to bed he was in his normal state of health and noted no swelling in his genital region. Patient denies any dysuria no fever no nausea or vomiting. Patient also admits to right flank pain current pain score 5 out of 10.   Past Medical History:  Diagnosis Date  . Gout   . Hypertension   . Schizophrenia [facility])     Patient Active Problem List   Diagnosis Date Noted  . Disorganized schizophrenia (HCC)   . Noncompliance 05/07/2016  . HTN (hypertension) 01/21/2016  . Cannabis use disorder, moderate, dependence (HCC) 01/21/2016  . Tobacco use disorder 01/21/2016  . Gout 01/21/2016  . UTI (urinary tract infection) 01/21/2016  . Hidradenitis 01/21/2016    Past Surgical History:  Procedure Laterality Date  . sweat gland removal      Prior to Admission medications   Medication Sig Start Date End Date Taking? Authorizing Provider  amLODipine-benazepril (LOTREL) 10-40 MG capsule Take 1 capsule by mouth daily. 05/12/16  Yes Jolanta B Pucilowska, MD  benztropine (COGENTIN) 1 MG tablet Take 1 mg by mouth daily.   Yes Historical Provider, MD  colchicine 0.6 MG tablet 1 tablet by mouth daily until gout pain is gone. 04/24/16  Yes Joni Reining, PA-C  doxycycline (VIBRA-TABS) 100 MG tablet Take 100 mg by mouth daily.  11/18/16  Yes Historical Provider, MD  haloperidol (HALDOL) 10 MG tablet Take 1 tablet (10 mg total) by mouth at bedtime. 05/12/16  Yes Jolanta B Pucilowska, MD  temazepam (RESTORIL) 30 MG capsule Take 1  capsule (30 mg total) by mouth at bedtime. Patient not taking: Reported on 01/08/2017 05/12/16   Shari Prows, MD    Allergies Bactrim [sulfamethoxazole-trimethoprim]; Lisinopril; and Other  No family history on file.  Social History Social History  Substance Use Topics  . Smoking status: Current Every Day Smoker    Packs/day: 1.00    Years: 9.00    Types: Cigarettes  . Smokeless tobacco: Never Used  . Alcohol use Yes    Review of Systems Constitutional: No fever/chills Eyes: No visual changes. ENT: No sore throat. Cardiovascular: Denies chest pain. Respiratory: Denies shortness of breath. Gastrointestinal: No abdominal pain.  No nausea, no vomiting.  No diarrhea.  No constipation. Genitourinary: Negative for dysuria. Positive for penile and scrotal swelling Musculoskeletal: Negative for back pain. Skin: Negative for rash. Neurological: Negative for headaches, focal weakness or numbness.  10-point ROS otherwise negative.  ____________________________________________   PHYSICAL EXAM:  VITAL SIGNS: ED Triage Vitals  Enc Vitals Group     BP 01/08/17 0111 (!) 162/100     Pulse Rate 01/08/17 0111 80     Resp 01/08/17 0111 18     Temp 01/08/17 0111 99 F (37.2 C)     Temp Source 01/08/17 0111 Oral     SpO2 01/08/17 0111 99 %     Weight 01/08/17 0111 250 lb (113.4 kg)     Height 01/08/17 0111 5\' 8"  (1.727 m)     Head Circumference --  Peak Flow --      Pain Score 01/08/17 0307 0     Pain Loc --      Pain Edu? --      Excl. in GC? --     Constitutional: Alert and oriented. Well appearing and in no acute distress. Eyes: Conjunctivae are normal. PERRL. EOMI. Head: Atraumatic. Ears:  Healthy appearing ear canals and TMs bilaterally Nose: No congestion/rhinnorhea. Mouth/Throat: Mucous membranes are moist Neck: No stridor.    Cardiovascular: Normal rate, regular rhythm. Good peripheral circulation. Grossly normal heart sounds. Respiratory: Normal  respiratory effort.  No retractions. Lungs CTAB. Gastrointestinal: Soft and nontender. No distention.  Genitourinary: Marked scrotal and penile edema noted Musculoskeletal: No lower extremity tenderness nor edema. No gross deformities of extremities. Neurologic:  Normal speech and language. No gross focal neurologic deficits are appreciated.  Skin:  Skin is warm, dry and intact. No rash noted. Psychiatric: Mood and affect are normal. Speech and behavior are normal.  ____________________________________________   LABS (all labs ordered are listed, but only abnormal results are displayed)  Labs Reviewed  CHLAMYDIA/NGC RT PCR ([facility] ONLY) - Abnormal; Notable for the following:       Result Value   Chlamydia Tr DETECTED (*)    All other components within normal limits  URINALYSIS, COMPLETE (UACMP) WITH MICROSCOPIC - Abnormal; Notable for the following:    Color, Urine STRAW (*)    APPearance CLEAR (*)    Specific Gravity, Urine 1.001 (*)    Leukocytes, UA LARGE (*)    Bacteria, UA RARE (*)    Squamous Epithelial / LPF 0-5 (*)    All other components within normal limits  CBC - Abnormal; Notable for the following:    WBC 12.5 (*)    RDW 14.9 (*)    All other components within normal limits  COMPREHENSIVE METABOLIC PANEL - Abnormal; Notable for the following:    Potassium 3.4 (*)    Glucose, Bld 135 (*)    Calcium 8.7 (*)    Total Protein 8.2 (*)    All other components within normal limits  RAPID HIV SCREEN (HIV 1/2 AB+AG)     RADIOLOGY I, [place] Maryclaire Stoecker, personally viewed and evaluated these images (plain radiographs) as part of my medical decision making, as well as reviewing the written report by the radiologist.  US Scrotum  Result Date: 01/08/2017 CLINICAL DATA:  Bilateral testicle swelling and pain EXAM: SCROTAL ULTRASOUND DOPPLER ULTRASOUND OF THE TESTICLES TECHNIQUE: Complete ultrasound examination of the testicles, epididymis, and other scrotal structures  was performed. Color and spectral Doppler ultrasound were also utilized to evaluate blood flow to the testicles. COMPARISON:  None. FINDINGS: Right testicle Measurements: 4 x 2.4 x 2.9 cm. No mass or microlithiasis visualized. Left testicle Measurements: 3.8 x 2.5 x 2.8 cm. Small cyst anteriorly measuring 0.8 x 0.5 x 0.7 cm. Right epididymis:  Normal in size and appearance. Left epididymis:  Normal in size and appearance. Hydrocele:  Small bilateral hydroceles. Varicocele:  None visualized. Pulsed Doppler interrogation of both testes demonstrates normal low resistance arterial and venous waveforms bilaterally. Marked edema and scrotal wall thickening. IMPRESSION: 1. No sonographic evidence for testicular torsion 2. Small bilateral hydroceles 3. Small left testicular cyst 4. Marked scrotal edema Electronically Signed   By: Jasmine Pang M.D.   On: 01/08/2017 02:23   Ct Abdomen Pelvis W Contrast  Result Date: 01/08/2017 CLINICAL DATA:  36 year old male with right flank and testicular pain. EXAM: CT ABDOMEN AND PELVIS WITH  CONTRAST TECHNIQUE: Multidetector CT imaging of the abdomen and pelvis was performed using the standard protocol following bolus administration of intravenous contrast. CONTRAST:  ISOVUE-300 IOPAMIDOL (ISOVUE-300) INJECTION 61% COMPARISON:  Testicular ultrasound dated 01/08/2017 FINDINGS: Lower chest: The visualized lung bases are clear. No intra-abdominal free air or free fluid. Hepatobiliary: No focal liver abnormality is seen. No gallstones, gallbladder wall thickening, or biliary dilatation. Pancreas: Unremarkable. No pancreatic ductal dilatation or surrounding inflammatory changes. Spleen: Normal in size without focal abnormality. Adrenals/Urinary Tract: Adrenal glands are unremarkable. Kidneys are normal, without renal calculi, focal lesion, or hydronephrosis. Bladder is unremarkable. Stomach/Bowel: There is moderate stool throughout the colon. No evidence of bowel obstruction or  active inflammation. Normal appendix. Vascular/Lymphatic: The abdominal aorta and IVC appear unremarkable. The SMV, splenic vein, and main portal vein are patent. No portal venous gas identified. There is no adenopathy. Reproductive: The prostate and seminal vesicles are grossly unremarkable. Other: Small bilateral hydroceles and scrotal edema. There is thickening of the skin along the gluteal fissure. A small fluid containing tract noted in the subcutaneous soft tissues extending from the skin at the level of S2 to the perianal region likely representing a pilonidal sinus tract with mild infection/inflammation. Correlation with clinical exam recommended. Musculoskeletal: No acute or significant osseous findings. IMPRESSION: 1.  No acute intra- abdominal or pelvic pathology. 2. Fluid containing subcutaneous tract extending from the skin in the upper gluteal region to the perianal area likely representing a pilonidal sinus tract with associated mild inflammation/infection. Clinical correlation is recommended. 3. Small bilateral hydroceles and mild scrotal edema. Electronically Signed   By: Elgie Collard M.D.   On: 01/08/2017 04:20   [place] Art/ven Flow Abd Pelv Doppler  Result Date: 01/08/2017 CLINICAL DATA:  Bilateral testicle swelling and pain EXAM: SCROTAL ULTRASOUND DOPPLER ULTRASOUND OF THE TESTICLES TECHNIQUE: Complete ultrasound examination of the testicles, epididymis, and other scrotal structures was performed. Color and spectral Doppler ultrasound were also utilized to evaluate blood flow to the testicles. COMPARISON:  None. FINDINGS: Right testicle Measurements: 4 x 2.4 x 2.9 cm. No mass or microlithiasis visualized. Left testicle Measurements: 3.8 x 2.5 x 2.8 cm. Small cyst anteriorly measuring 0.8 x 0.5 x 0.7 cm. Right epididymis:  Normal in size and appearance. Left epididymis:  Normal in size and appearance. Hydrocele:  Small bilateral hydroceles. Varicocele:  None visualized. Pulsed Doppler  interrogation of both testes demonstrates normal low resistance arterial and venous waveforms bilaterally. Marked edema and scrotal wall thickening. IMPRESSION: 1. No sonographic evidence for testicular torsion 2. Small bilateral hydroceles 3. Small left testicular cyst 4. Marked scrotal edema Electronically Signed   By: Jasmine Pang M.D.   On: 01/08/2017 02:23     Procedures     INITIAL IMPRESSION / ASSESSMENT AND PLAN / ED COURSE  Pertinent labs & imaging results that were available during my care of the patient were reviewed by me and considered in my medical decision making (see chart for details).  Patient discussed with Dr. Evette Cristal car who saw the patient a month ago. Dr. Evette Cristal stated that the patient required a higher level of care and requested that the patient be transferred.: [facility] transfer center unfortunately emergency department is on bypass at this time for medicine and surgery. Patient discussed with Dr.Tsuei general surgeon on call at [facility] who refused to accept the patient in transfer. Patient given IV Zosyn here in the emergency department.    ____________________________________________  FINAL CLINICAL IMPRESSION(S) / ED DIAGNOSES  Final diagnoses:  Testicular swelling  Chlamydia    MEDICATIONS GIVEN DURING THIS VISIT:  Medications  iopamidol (ISOVUE-300) 61 % injection 100 mL (100 mLs Intravenous Contrast Given 01/08/17 0349)  piperacillin-tazobactam (ZOSYN) IVPB 3.375 g (0 g Intravenous Stopped 01/08/17 0637)  azithromycin (ZITHROMAX) powder 1 g (1 g Oral Given 01/08/17 0456)     NEW OUTPATIENT MEDICATIONS STARTED DURING THIS VISIT:  New Prescriptions   No medications on file    Modified Medications   No medications on file    Discontinued Medications   No medications on file     Note:  This document was prepared using Dragon voice recognition software and may include unintentional dictation errors.    Darci Current,  MD 01/08/17 [phone]

## 2017-01-08 NOTE — ED Triage Notes (Signed)
Pt in with co testicular swelling and soreness to penis, denies any pain or cause. No hx of the same no trouble voiding at this time. Tried compresses with some decrease in swelling.

## 2017-01-08 NOTE — Care Management Obs Status (Signed)
MEDICARE OBSERVATION STATUS NOTIFICATION   Patient Details  Name: Walter Pena. MRN: [id number] Date of Birth: 06/09/1981   Medicare Observation Status Notification Given:  No (admitted observation less 24 hours)    Chapman Fitch, RN 01/08/2017, 2:45 PM

## 2017-01-08 NOTE — Progress Notes (Signed)
IV was removed. Discharge instructions, follow-up appointments, and prescriptions were provided to the pt. Education was provided to the pt and the pt's girlfriend at bedside. Girlfriend was advised to seek treatment at the health department for STI treatment. All questions were answered.

## 2017-01-08 NOTE — H&P (Addendum)
Walter Pena. is an 36 y.o. male.   Chief Complaint: Scrotal swelling HPI: The patient with past medical history of hidradenitis presents emergency department complaining of testicular pain. He states that his genitals have been swelling over the last few days. He denies fevers, nausea, vomiting or diarrhea. He also denies penile discharge. Laboratory evaluation in the emergency department revealed leukocytosis as well as urine positive for Chlamydia infection. CT of the abdomen and pelvis shows disorganized subcutaneous fluid collection likely representing a pilonidal sinus tract infection or inflammation. Surgery was consulted who states the patient is not a surgical candidate at this time and recommended IV antibiotics for which the emergency department staff contacted the hospitalist service.  Past Medical History:  Diagnosis Date  . Gout   . Hidradenitis   . Hypertension   . Schizophrenia [facility])     Past Surgical History:  Procedure Laterality Date  . sweat gland removal      Family History  Problem Relation Age of Onset  . Diabetes Mellitus II Mother   . Hypertension Mother    Social History:  reports that he has been smoking Cigarettes.  He has a 9.00 pack-year smoking history. He has never used smokeless tobacco. He reports that he drinks alcohol. He reports that he uses drugs, including Marijuana.  Allergies:  Allergies  Allergen Reactions  . Bactrim [Sulfamethoxazole-Trimethoprim] Nausea And Vomiting  . Lisinopril Swelling  . Other Rash    aloe    Prior to Admission medications   Medication Sig Start Date End Date Taking? Authorizing Provider  amLODipine-benazepril (LOTREL) 10-40 MG capsule Take 1 capsule by mouth daily. 05/12/16  Yes Jolanta B Pucilowska, MD  benztropine (COGENTIN) 1 MG tablet Take 1 mg by mouth daily.   Yes Historical Provider, MD  colchicine 0.6 MG tablet 1 tablet by mouth daily until gout pain is gone. 04/24/16  Yes Sable Feil, PA-C    doxycycline (VIBRA-TABS) 100 MG tablet Take 100 mg by mouth daily.  11/18/16  Yes Historical Provider, MD  haloperidol (HALDOL) 10 MG tablet Take 1 tablet (10 mg total) by mouth at bedtime. 05/12/16  Yes Jolanta B Pucilowska, MD  temazepam (RESTORIL) 30 MG capsule Take 1 capsule (30 mg total) by mouth at bedtime. Patient not taking: Reported on 01/08/2017 05/12/16   Clovis Fredrickson, MD     Results for orders placed or performed during the hospital encounter of 01/08/17 (from the past 48 hour(s))  Urinalysis, Complete w Microscopic     Status: Abnormal   Collection Time: 01/08/17  2:25 AM  Result Value Ref Range   Color, Urine STRAW (A) YELLOW   APPearance CLEAR (A) CLEAR   Specific Gravity, Urine 1.001 (L) 1.005 - 1.030   pH 6.0 5.0 - 8.0   Glucose, UA NEGATIVE NEGATIVE mg/dL   Hgb urine dipstick NEGATIVE NEGATIVE   Bilirubin Urine NEGATIVE NEGATIVE   Ketones, ur NEGATIVE NEGATIVE mg/dL   Protein, ur NEGATIVE NEGATIVE mg/dL   Nitrite NEGATIVE NEGATIVE   Leukocytes, UA LARGE (A) NEGATIVE   RBC / HPF 0-5 0 - 5 RBC/hpf   WBC, UA TOO NUMEROUS TO COUNT 0 - 5 WBC/hpf   Bacteria, UA RARE (A) NONE SEEN   Squamous Epithelial / LPF 0-5 (A) NONE SEEN  Chlamydia/NGC rt PCR     Status: Abnormal   Collection Time: 01/08/17  2:25 AM  Result Value Ref Range   Specimen source GC/Chlam URINE, RANDOM    Chlamydia Tr DETECTED (A) NOT  DETECTED   N gonorrhoeae NOT DETECTED NOT DETECTED    Comment: (NOTE) 100  This methodology has not been evaluated in pregnant women or in 200  patients with a history of hysterectomy. 300 400  This methodology will not be performed on patients less than 33  years of age.   CBC     Status: Abnormal   Collection Time: 01/08/17  3:11 AM  Result Value Ref Range   WBC 12.5 (H) 3.8 - 10.6 K/uL   RBC 4.64 4.40 - 5.90 MIL/uL   Hemoglobin 13.8 13.0 - 18.0 g/dL   HCT 40.2 40.0 - 52.0 %   MCV 86.5 80.0 - 100.0 fL   MCH 29.7 26.0 - 34.0 pg   MCHC 34.3 32.0 - 36.0 g/dL    RDW 14.9 (H) 11.5 - 14.5 %   Platelets 330 150 - 440 K/uL  Comprehensive metabolic panel     Status: Abnormal   Collection Time: 01/08/17  3:11 AM  Result Value Ref Range   Sodium 138 135 - 145 mmol/L   Potassium 3.4 (L) 3.5 - 5.1 mmol/L   Chloride 105 101 - 111 mmol/L   CO2 27 22 - 32 mmol/L   Glucose, Bld 135 (H) 65 - 99 mg/dL   BUN 11 6 - 20 mg/dL   Creatinine, Ser 1.06 0.61 - 1.24 mg/dL   Calcium 8.7 (L) 8.9 - 10.3 mg/dL   Total Protein 8.2 (H) 6.5 - 8.1 g/dL   Albumin 3.5 3.5 - 5.0 g/dL   AST 24 15 - 41 U/L   ALT 24 17 - 63 U/L   Alkaline Phosphatase 67 38 - 126 U/L   Total Bilirubin 0.3 0.3 - 1.2 mg/dL   GFR calc non Af Amer >60 >60 mL/min   GFR calc Af Amer >60 >60 mL/min    Comment: (NOTE) The eGFR has been calculated using the CKD EPI equation. This calculation has not been validated in all clinical situations. eGFR's persistently <60 mL/min signify possible Chronic Kidney Disease.    Anion gap 6 5 - 15  Rapid HIV screen (HIV 1/2 Ab+Ag)     Status: None   Collection Time: 01/08/17  3:11 AM  Result Value Ref Range   HIV-1 P24 Antigen - HIV24 NON REACTIVE NON REACTIVE   HIV 1/2 Antibodies NON REACTIVE NON REACTIVE   Interpretation (HIV Ag Ab)      A non reactive test result means that HIV 1 or HIV 2 antibodies and HIV 1 p24 antigen were not detected in the specimen.   US Scrotum  Result Date: 01/08/2017 CLINICAL DATA:  Bilateral testicle swelling and pain EXAM: SCROTAL ULTRASOUND DOPPLER ULTRASOUND OF THE TESTICLES TECHNIQUE: Complete ultrasound examination of the testicles, epididymis, and other scrotal structures was performed. Color and spectral Doppler ultrasound were also utilized to evaluate blood flow to the testicles. COMPARISON:  None. FINDINGS: Right testicle Measurements: 4 x 2.4 x 2.9 cm. No mass or microlithiasis visualized. Left testicle Measurements: 3.8 x 2.5 x 2.8 cm. Small cyst anteriorly measuring 0.8 x 0.5 x 0.7 cm. Right epididymis:  Normal in size  and appearance. Left epididymis:  Normal in size and appearance. Hydrocele:  Small bilateral hydroceles. Varicocele:  None visualized. Pulsed Doppler interrogation of both testes demonstrates normal low resistance arterial and venous waveforms bilaterally. Marked edema and scrotal wall thickening. IMPRESSION: 1. No sonographic evidence for testicular torsion 2. Small bilateral hydroceles 3. Small left testicular cyst 4. Marked scrotal edema Electronically Signed   By:  Donavan Foil M.D.   On: 01/08/2017 02:23   Ct Abdomen Pelvis W Contrast  Result Date: 01/08/2017 CLINICAL DATA:  36 year old male with right flank and testicular pain. EXAM: CT ABDOMEN AND PELVIS WITH CONTRAST TECHNIQUE: Multidetector CT imaging of the abdomen and pelvis was performed using the standard protocol following bolus administration of intravenous contrast. CONTRAST:  139m ISOVUE-300 IOPAMIDOL (ISOVUE-300) INJECTION 61% COMPARISON:  Testicular ultrasound dated 01/08/2017 FINDINGS: Lower chest: The visualized lung bases are clear. No intra-abdominal free air or free fluid. Hepatobiliary: No focal liver abnormality is seen. No gallstones, gallbladder wall thickening, or biliary dilatation. Pancreas: Unremarkable. No pancreatic ductal dilatation or surrounding inflammatory changes. Spleen: Normal in size without focal abnormality. Adrenals/Urinary Tract: Adrenal glands are unremarkable. Kidneys are normal, without renal calculi, focal lesion, or hydronephrosis. Bladder is unremarkable. Stomach/Bowel: There is moderate stool throughout the colon. No evidence of bowel obstruction or active inflammation. Normal appendix. Vascular/Lymphatic: The abdominal aorta and IVC appear unremarkable. The SMV, splenic vein, and main portal vein are patent. No portal venous gas identified. There is no adenopathy. Reproductive: The prostate and seminal vesicles are grossly unremarkable. Other: Small bilateral hydroceles and scrotal edema. There is  thickening of the skin along the gluteal fissure. A small fluid containing tract noted in the subcutaneous soft tissues extending from the skin at the level of S2 to the perianal region likely representing a pilonidal sinus tract with mild infection/inflammation. Correlation with clinical exam recommended. Musculoskeletal: No acute or significant osseous findings. IMPRESSION: 1.  No acute intra- abdominal or pelvic pathology. 2. Fluid containing subcutaneous tract extending from the skin in the upper gluteal region to the perianal area likely representing a pilonidal sinus tract with associated mild inflammation/infection. Clinical correlation is recommended. 3. Small bilateral hydroceles and mild scrotal edema. Electronically Signed   By: AAnner CreteM.D.   On: 01/08/2017 04:20   [place]Art/ven Flow Abd Pelv Doppler  Result Date: 01/08/2017 CLINICAL DATA:  Bilateral testicle swelling and pain EXAM: SCROTAL ULTRASOUND DOPPLER ULTRASOUND OF THE TESTICLES TECHNIQUE: Complete ultrasound examination of the testicles, epididymis, and other scrotal structures was performed. Color and spectral Doppler ultrasound were also utilized to evaluate blood flow to the testicles. COMPARISON:  None. FINDINGS: Right testicle Measurements: 4 x 2.4 x 2.9 cm. No mass or microlithiasis visualized. Left testicle Measurements: 3.8 x 2.5 x 2.8 cm. Small cyst anteriorly measuring 0.8 x 0.5 x 0.7 cm. Right epididymis:  Normal in size and appearance. Left epididymis:  Normal in size and appearance. Hydrocele:  Small bilateral hydroceles. Varicocele:  None visualized. Pulsed Doppler interrogation of both testes demonstrates normal low resistance arterial and venous waveforms bilaterally. Marked edema and scrotal wall thickening. IMPRESSION: 1. No sonographic evidence for testicular torsion 2. Small bilateral hydroceles 3. Small left testicular cyst 4. Marked scrotal edema Electronically Signed   By: KDonavan FoilM.D.   On: 01/08/2017  02:23    Review of Systems  Constitutional: Negative for chills and fever.  HENT: Negative for sore throat and tinnitus.   Eyes: Negative for blurred vision and redness.  Respiratory: Negative for cough and shortness of breath.   Cardiovascular: Negative for chest pain, palpitations, orthopnea and PND.  Gastrointestinal: Negative for abdominal pain, diarrhea, nausea and vomiting.  Genitourinary: Negative for dysuria, frequency and urgency.  Musculoskeletal: Negative for joint pain and myalgias.  Skin: Negative for rash.       No lesions  Neurological: Negative for speech change, focal weakness and weakness.  Endo/Heme/Allergies: Does not  bruise/bleed easily.       No temperature intolerance  Psychiatric/Behavioral: Negative for depression and suicidal ideas.    Blood pressure (!) 150/100, pulse 68, temperature 98.7 F (37.1 C), temperature source Oral, resp. rate 18, height '5\' 8"'$  (1.727 m), weight 113.4 kg (250 lb), SpO2 100 %. Physical Exam  Vitals reviewed. Constitutional: He is oriented to person, place, and time. He appears well-developed and well-nourished. No distress.  HENT:  Head: Normocephalic and atraumatic.  Mouth/Throat: Oropharynx is clear and moist.  Eyes: Conjunctivae and EOM are normal. Pupils are equal, round, and reactive to light. No scleral icterus.  Neck: Normal range of motion. Neck supple. No JVD present. No tracheal deviation present. No thyromegaly present.  Cardiovascular: Normal rate, regular rhythm and normal heart sounds.  Exam reveals no gallop and no friction rub.   No murmur heard. Respiratory: Effort normal and breath sounds normal. No respiratory distress.  GI: Soft. Bowel sounds are normal. He exhibits no distension and no mass. There is tenderness. There is no rebound and no guarding.  Genitourinary:     Genitourinary Comments: Scrotal swelling  Musculoskeletal: Normal range of motion. He exhibits no edema.  Lymphadenopathy:    He has no  cervical adenopathy.  Neurological: He is alert and oriented to person, place, and time. No cranial nerve deficit.  Skin: Skin is warm and dry. No erythema.  Enumerable areas of scarred cystic lesions all over body  Psychiatric: He has a normal mood and affect. His behavior is normal. Judgment and thought content normal.     Assessment/Plan This is a 36 year old male admitted for scrotal swelling. 1. Scrotal swelling: Fluid collection within pilonidal sinus. No abscess visualized on CT or ultrasound. The patient has received a dose of Zosyn in the emergency department. Though his skin is warm and there is no purulence or erythema appreciated thus I have stepped back his antibiotic therapy to ceftriaxone. Monitor for signs or symptoms of sepsis. 2. Hidradenitis: Potentially the source of current infection/inflammation. 3. Hypertension uncontrolled; continue amlodipine and benazepril per home regimen. Labetalol as needed. 4. Chlamydial infection: The patient is received azithromycin 1 g in the emergency department. Health department will be contacted. 5. Schizophrenia: The patient does not have any auditory or visual hallucinations at this time. Continue Haldol and Cogentin per home regimen. 6. DVT prophylaxis: Lovenox 7. GI prophylaxis: None The patient is a full code. Time spent on admission orders and patient care approximately 45 minutes  Harrie Foreman, MD 01/08/2017, 7:06 AM

## 2017-01-21 ENCOUNTER — Ambulatory Visit: Payer: Medicare Other | Admitting: General Surgery

## 2017-02-14 ENCOUNTER — Encounter: Payer: Self-pay | Admitting: Emergency Medicine

## 2017-02-14 ENCOUNTER — Emergency Department: Payer: Medicare Other

## 2017-02-14 ENCOUNTER — Emergency Department
Admission: EM | Admit: 2017-02-14 | Discharge: 2017-02-14 | Disposition: A | Discharge status: Home / Self Care | Payer: Medicare Other | Source: Home / Self Care | Attending: Emergency Medicine | Admitting: Emergency Medicine

## 2017-02-14 ENCOUNTER — Emergency Department
Admission: EM | Admit: 2017-02-14 | Discharge: 2017-02-17 | Disposition: A | Discharge status: Other Acute Inpatient Hospital | Payer: Medicare Other | Attending: Emergency Medicine | Admitting: Emergency Medicine

## 2017-02-14 DIAGNOSIS — F201 Disorganized schizophrenia: Secondary | ICD-10-CM | POA: Diagnosis not present

## 2017-02-14 DIAGNOSIS — L732 Hidradenitis suppurativa: Secondary | ICD-10-CM | POA: Diagnosis present

## 2017-02-14 DIAGNOSIS — R410 Disorientation, unspecified: Secondary | ICD-10-CM | POA: Insufficient documentation

## 2017-02-14 DIAGNOSIS — F17210 Nicotine dependence, cigarettes, uncomplicated: Secondary | ICD-10-CM | POA: Insufficient documentation

## 2017-02-14 DIAGNOSIS — M109 Gout, unspecified: Secondary | ICD-10-CM | POA: Diagnosis not present

## 2017-02-14 DIAGNOSIS — Z79899 Other long term (current) drug therapy: Secondary | ICD-10-CM | POA: Insufficient documentation

## 2017-02-14 DIAGNOSIS — I10 Essential (primary) hypertension: Secondary | ICD-10-CM | POA: Insufficient documentation

## 2017-02-14 DIAGNOSIS — Z91199 Patient's noncompliance with other medical treatment and regimen due to unspecified reason: Secondary | ICD-10-CM

## 2017-02-14 DIAGNOSIS — R4182 Altered mental status, unspecified: Secondary | ICD-10-CM | POA: Insufficient documentation

## 2017-02-14 DIAGNOSIS — F1220 Cannabis dependence, uncomplicated: Secondary | ICD-10-CM | POA: Diagnosis present

## 2017-02-14 DIAGNOSIS — Z9119 Patient's noncompliance with other medical treatment and regimen: Secondary | ICD-10-CM

## 2017-02-14 LAB — COMPREHENSIVE METABOLIC PANEL
ALBUMIN: 4 g/dL (ref 3.5–5.0)
ALT: 50 U/L (ref 17–63)
AST: 50 U/L — AB (ref 15–41)
Alkaline Phosphatase: 78 U/L (ref 38–126)
Anion gap: 10 (ref 5–15)
BILIRUBIN TOTAL: 1.2 mg/dL (ref 0.3–1.2)
BUN: 27 mg/dL — AB (ref 6–20)
CHLORIDE: 100 mmol/L — AB (ref 101–111)
CO2: 28 mmol/L (ref 22–32)
Calcium: 9.4 mg/dL (ref 8.9–10.3)
Creatinine, Ser: 1.47 mg/dL — ABNORMAL HIGH (ref 0.61–1.24)
GFR calc Af Amer: >60 mL/min (ref >60)
GFR calc non Af Amer: 60 mL/min — ABNORMAL LOW (ref >60)
GLUCOSE: 147 mg/dL — AB (ref 65–99)
POTASSIUM: 3.5 mmol/L (ref 3.5–5.1)
Sodium: 138 mmol/L (ref 135–145)
Total Protein: 9.9 g/dL — ABNORMAL HIGH (ref 6.5–8.1)

## 2017-02-14 LAB — CBC
HEMATOCRIT: 44.7 % (ref 40.0–52.0)
Hemoglobin: 15 g/dL (ref 13.0–18.0)
MCH: 28.9 pg (ref 26.0–34.0)
MCHC: 33.6 g/dL (ref 32.0–36.0)
MCV: 85.9 fL (ref 80.0–100.0)
Platelets: 319 10*3/uL (ref 150–440)
RBC: 5.2 MIL/uL (ref 4.40–5.90)
RDW: 15 % — ABNORMAL HIGH (ref 11.5–14.5)
WBC: 14 10*3/uL — ABNORMAL HIGH (ref 3.8–10.6)

## 2017-02-14 LAB — ETHANOL

## 2017-02-14 LAB — ACETAMINOPHEN LEVEL: Acetaminophen (Tylenol), Serum: <10 ug/mL — ABNORMAL LOW (ref 10–30)

## 2017-02-14 LAB — SALICYLATE LEVEL: Salicylate Lvl: <7 mg/dL (ref 2.8–30.0)

## 2017-02-14 NOTE — ED Notes (Signed)
I walked by the room and this patient was putting on his cloths and had already pulled out his IV. He stated to me that he was leaving. I asked him would he please wait and talk to the RN or the Physician and he stated no. I am leaving. I tried to cover the IV site with guaze and tape but he refused and and said no, it will be ok. He then walked out of the room and left walking to the lobby.

## 2017-02-14 NOTE — ED Triage Notes (Signed)
Pt presents to ED via [facility] from his apt complex. EMS called out by [employer] for "suspicious condition." Pt found wandering around apartment complex barefoot, flashing his genitals. Pt is alert but disoriented, calm and cooperative at this time, repeatedly asking for water but does not answer assessment questions.

## 2017-02-14 NOTE — ED Provider Notes (Signed)
[facility] Emergency Department Provider Note  ____________________________________________  Time seen: Approximately 3:58 PM  I have reviewed the triage vital signs and the nursing notes.   HISTORY  Chief Complaint No chief complaint on file.  Level 5 caveat: Portions of the history and physical were unable to be obtained due to AMS  HPI Walter Pena. is a 36 y.o. male with h/o hidradenitis, HTN, schizophrenia who presents for AMS. Patient was here earlier today and very confused. Patient left the emergency department without informing any of the staff. Since patient was altered and confused I thought patient was a risk for himself so IVC paperwork was taken by me and police was able to locate patient outside of the hospital and brought him back to the emergency room. Patient is even more confused. Thinks that he was at a football game when he was outside in the gas station. He is upset that he thinks he has been arrested. I explained to him that he was brought back because I was concerned about him and he is not under arrest. Patient does not know that he is in a hospital. Patient agrees to undergo further testing at this time. Patient is more awake than he was and I first saw him and is neurologically intact. He has no complaints at this time.   Past Medical History:  Diagnosis Date  . Gout   . Hidradenitis   . Hypertension   . Schizophrenia [facility])     Patient Active Problem List   Diagnosis Date Noted  . Scrotal swelling 01/08/2017  . Disorganized schizophrenia (HCC)   . Noncompliance 05/07/2016  . HTN (hypertension) 01/21/2016  . Cannabis use disorder, moderate, dependence (HCC) 01/21/2016  . Tobacco use disorder 01/21/2016  . Gout 01/21/2016  . UTI (urinary tract infection) 01/21/2016  . Hidradenitis 01/21/2016    Past Surgical History:  Procedure Laterality Date  . sweat gland removal      Prior to Admission medications     Medication Sig Start Date End Date Taking? Authorizing Provider  amLODipine-benazepril (LOTREL) 10-40 MG capsule Take 1 capsule by mouth daily. 05/12/16   Shari Prows, MD  benztropine (COGENTIN) 1 MG tablet Take 1 mg by mouth daily.    Historical Provider, MD  colchicine 0.6 MG tablet 1 tablet by mouth daily until gout pain is gone. 04/24/16   Joni Reining, PA-C  doxycycline (VIBRA-TABS) 100 MG tablet Take 1 tablet (100 mg total) by mouth 2 (two) times daily. 01/08/17   Altamese Dilling, MD  haloperidol (HALDOL) 10 MG tablet Take 1 tablet (10 mg total) by mouth at bedtime. 05/12/16   Jolanta B Pucilowska, MD  temazepam (RESTORIL) 30 MG capsule Take 1 capsule (30 mg total) by mouth at bedtime. Patient not taking: Reported on 01/08/2017 05/12/16   Shari Prows, MD    Allergies Bactrim [sulfamethoxazole-trimethoprim]; Lisinopril; and Other  Family History  Problem Relation Age of Onset  . Diabetes Mellitus II Mother   . Hypertension Mother     Social History Social History  Substance Use Topics  . Smoking status: Current Every Day Smoker    Packs/day: 1.00    Years: 9.00    Types: Cigarettes  . Smokeless tobacco: Never Used  . Alcohol use Yes    Review of Systems  Constitutional: Negative for fever. + confusion Eyes: Negative for visual changes. ENT: Negative for sore throat. Neck: No neck pain  Cardiovascular: Negative for chest pain. Respiratory: Negative  for shortness of breath. Gastrointestinal: Negative for abdominal pain, vomiting or diarrhea. Genitourinary: Negative for dysuria. Musculoskeletal: Negative for back pain. Skin: Negative for rash. Neurological: Negative for headaches, weakness or numbness. Psych: No SI or HI  ____________________________________________   PHYSICAL EXAM:  VITAL SIGNS: ED Triage Vitals  Enc Vitals Group     BP 02/14/17 1509 (!) 146/89     Pulse Rate 02/14/17 1509 (!) 122     Resp 02/14/17 1509 20     Temp  02/14/17 1509 97.7 F (36.5 C)     Temp Source 02/14/17 1509 Oral     SpO2 02/14/17 1509 95 %     Weight 02/14/17 1510 220 lb (99.8 kg)     Height 02/14/17 1510 5\' 6"  (1.676 m)     Head Circumference --      Peak Flow --      Pain Score --      Pain Loc --      Pain Edu? --      Excl. in GC? --     Constitutional: Alert and oriented to self only, no distress, walking with no difficulty.  HEENT:      Head: Normocephalic and atraumatic.         Eyes: Conjunctivae are normal. Sclera is non-icteric. EOMI. PERRL      Mouth/Throat: Mucous membranes are moist.       Neck: Supple with no signs of meningismus. Cardiovascular: Regular rate and rhythm. No murmurs, gallops, or rubs. 2+ symmetrical distal pulses are present in all extremities. No JVD. Respiratory: Normal respiratory effort. Lungs are clear to auscultation bilaterally. No wheezes, crackles, or rhonchi.  Gastrointestinal: Soft, non tender, and non distended with positive bowel sounds. No rebound or guarding. Musculoskeletal: Nontender with normal range of motion in all extremities. No edema, cyanosis, or erythema of extremities. Neurologic: Normal speech and language. Face is symmetric. Moving all extremities. No gross focal neurologic deficits are appreciated. Skin: Skin is warm, dry and intact. No rash noted. Psychiatric: Disorganized thoughts, confused  ____________________________________________   LABS (all labs ordered are listed, but only abnormal results are displayed)  Labs Reviewed  ACETAMINOPHEN LEVEL - Abnormal; Notable for the following:       Result Value   Acetaminophen (Tylenol), Serum <10 (*)    All other components within normal limits  ETHANOL  SALICYLATE LEVEL  URINE DRUG SCREEN, QUALITATIVE ([facility] ONLY)   ____________________________________________  EKG  None ____________________________________________  RADIOLOGY  Head CT: negative   ____________________________________________   PROCEDURES  Procedure(s) performed: None Procedures Critical Care performed:  None ____________________________________________   INITIAL IMPRESSION / ASSESSMENT AND PLAN / ED COURSE   36 y.o. male with h/o hidradenitis, HTN, schizophrenia who presents for AMS. We'll continue evaluation there was started on his first visit earlier today. Patient's vital signs are within normal limits, he is afebrile, has no meningeal signs, looks more alert than earlier today but still very confused. Labs done earlier today showed mildly elevated creatinine with normal GFR concerning for possible dehydration. Patient had leukocytosis with white count of 14 of unclear etiology. CT head, drug screen, alcohol level had not been done. Those have been reordered at this time. Patient is under IVC commitment.  Clinical Course as of Feb 14 2049  Sat Feb 14, 2017  2026 Labs and head CT WNL. Drug screen pending. Patient has not urinated yet. Pending SOC evaluation.  [CV]  2048 Drug screen pending. Care transferred to Dr. Cyril Loosen  [CV]  Clinical Course User Index [CV] Nita Sickle, MD    Pertinent labs & imaging results that were available during my care of the patient were reviewed by me and considered in my medical decision making (see chart for details).    ____________________________________________   FINAL CLINICAL IMPRESSION(S) / ED DIAGNOSES  Final diagnoses:  Confusion      NEW MEDICATIONS STARTED DURING THIS VISIT:  New Prescriptions   No medications on file     Note:  This document was prepared using Dragon voice recognition software and may include unintentional dictation errors.    Nita Sickle, MD 02/14/17 2050

## 2017-02-14 NOTE — ED Notes (Signed)
Pt dressed out by Durene Cal, RN and Gerilyn Pilgrim, EDT.
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
## 2017-02-14 NOTE — ED Triage Notes (Signed)
Pt returns to ED in [employer] custody after having left AMA. IVC papers taken out by Dr. Don Perking. Pt behaving similar to previous visit, reluctant to answers questions or make eye contact. Repeatedly asking for water. Otherwise pt is in no apparent distress at this time. Cooperative with triage process. Per Dr. Don Perking, do not order protocols at this time, pt to be moved to behavioral area of ED.

## 2017-02-14 NOTE — ED Provider Notes (Signed)
[facility] Emergency Department Provider Note  ____________________________________________  Time seen: Approximately 1:25 PM  I have reviewed the triage vital signs and the nursing notes.   HISTORY  Chief Complaint Altered Mental Status  Level 5 caveat:  Portions of the history and physical were unable to be obtained due to AMS   HPI Walter Pena. is a 36 y.o. male with h/o hidradenitis, HTN, schizophrenia who presents for AMS. Patient is brought in by police after being found wandering in his apartment complex, barefoot, and flashing his genitals. Patient is awake, looks at me, tells me his name, tells me his whole body hurts, he does not know where he is or why he is here. He denies drugs or ethanol. He does not answer any other questions.  Past Medical History:  Diagnosis Date  . Gout   . Hidradenitis   . Hypertension   . Schizophrenia [facility])     Patient Active Problem List   Diagnosis Date Noted  . Scrotal swelling 01/08/2017  . Disorganized schizophrenia (HCC)   . Noncompliance 05/07/2016  . HTN (hypertension) 01/21/2016  . Cannabis use disorder, moderate, dependence (HCC) 01/21/2016  . Tobacco use disorder 01/21/2016  . Gout 01/21/2016  . UTI (urinary tract infection) 01/21/2016  . Hidradenitis 01/21/2016    Past Surgical History:  Procedure Laterality Date  . sweat gland removal      Prior to Admission medications   Medication Sig Start Date End Date Taking? Authorizing Provider  allopurinol (ZYLOPRIM) 100 MG tablet Take 100 mg by mouth daily. 01/21/17   Historical Provider, MD  amLODipine (NORVASC) 10 MG tablet Take 10 mg by mouth daily. 01/21/17   Historical Provider, MD  benazepril (LOTENSIN) 40 MG tablet Take 1 tablet by mouth daily. 01/21/17   Historical Provider, MD  benztropine (COGENTIN) 1 MG tablet Take 1 mg by mouth daily.    Historical Provider, MD  colchicine 0.6 MG tablet 1 tablet by mouth daily until gout pain is  gone. 04/24/16   Joni Reining, PA-C    Allergies Bactrim [sulfamethoxazole-trimethoprim]; Lisinopril; and Other  Family History  Problem Relation Age of Onset  . Diabetes Mellitus II Mother   . Hypertension Mother     Social History Social History  Substance Use Topics  . Smoking status: Current Every Day Smoker    Packs/day: 1.00    Years: 9.00    Types: Cigarettes  . Smokeless tobacco: Never Used  . Alcohol use Yes    Review of Systems  Constitutional: Negative for fever. + confusion and body aches Eyes: Negative for visual changes. ENT: Negative for sore throat. Neck: No neck pain  Cardiovascular: Negative for chest pain. Respiratory: Negative for shortness of breath. Gastrointestinal: Negative for abdominal pain, vomiting or diarrhea. Genitourinary: Negative for dysuria. Musculoskeletal: Negative for back pain. Skin: Negative for rash. Neurological: Negative for headaches, weakness or numbness. Psych: No SI or HI  ____________________________________________   PHYSICAL EXAM:  VITAL SIGNS: ED Triage Vitals  Enc Vitals Group     BP 02/14/17 1303 (!) 137/93     Pulse Rate 02/14/17 1303 (!) 107     Resp 02/14/17 1303 20     Temp 02/14/17 1303 98.6 F (37.2 C)     Temp Source 02/14/17 1303 Oral     SpO2 02/14/17 1303 97 %     Weight 02/14/17 1305 220 lb (99.8 kg)     Height 02/14/17 1305 5\' 6"  (1.676 m)  Head Circumference --      Peak Flow --      Pain Score --      Pain Loc --      Pain Edu? --      Excl. in GC? --     Constitutional: Somnolent, easily arouses, answers to some questions, no distress HEENT:      Head: Normocephalic and atraumatic.         Eyes: Conjunctivae are normal. Sclera is non-icteric. EOMI. PERRL      Mouth/Throat: Mucous membranes are moist.       Neck: Supple with no signs of meningismus. Cardiovascular: Tachycardic with regular rhythm. No murmurs, gallops, or rubs. 2+ symmetrical distal pulses are present in all  extremities. No JVD. Respiratory: Normal respiratory effort. Lungs are clear to auscultation bilaterally. No wheezes, crackles, or rhonchi.  Gastrointestinal: Soft, non tender, and non distended with positive bowel sounds. No rebound or guarding. Genitourinary: No CVA tenderness. Musculoskeletal: Nontender with normal range of motion in all extremities. No edema, cyanosis, or erythema of extremities. Neurologic: Confused, alert to self and month, PERRL, 4mm and reactive, intact strength x4, speech slurred. Skin: Skin is warm, dry and intact. No rash noted.   ____________________________________________   LABS (all labs ordered are listed, but only abnormal results are displayed)  Labs Reviewed  COMPREHENSIVE METABOLIC PANEL - Abnormal; Notable for the following:       Result Value   Chloride 100 (*)    Glucose, Bld 147 (*)    BUN 27 (*)    Creatinine, Ser 1.47 (*)    Total Protein 9.9 (*)    AST 50 (*)    GFR calc non Af Amer 60 (*)    All other components within normal limits  CBC - Abnormal; Notable for the following:    WBC 14.0 (*)    RDW 15.0 (*)    All other components within normal limits   ____________________________________________  EKG  ED ECG REPORT I, Nita Sickle, the attending physician, personally viewed and interpreted this ECG.  Sinus tachycardia, rate of 107, normal intervals, borderline prolonged QTC, normal axis, no ST elevations or depressions. No significant changes when compared to prior from 2014 ____________________________________________  RADIOLOGY  none  ____________________________________________   PROCEDURES  Procedure(s) performed: None Procedures Critical Care performed:  None ____________________________________________   INITIAL IMPRESSION / ASSESSMENT AND PLAN / ED COURSE  36 y.o. male with h/o hidradenitis, HTN, schizophrenia who presents for AMS. Patient is somnolent but easily arousable, alert and oriented to self  and month, pupils are equal round and reactive and 4 mm, equal strength in all 4 extremities, face is symmetric, patient had slurred speech. Physical exam showing sinus tachycardia, afebrile, no meningeal signs. Patient was fully undressed with no injuries. Will get head CT, labs, tox screen, IVF and monitor on telemetry  Clinical Course as of Feb 16 2156  Sat Feb 14, 2017  1411 I went to reassess patient and patient was no longer in his room. Had removed his IV and left. Since patient is confused, security was activated to look for patient. I went to the triage area and patient was not seen leaving through that area. Security and staff circling the hospital looking for patient.   [CV]  1423 Patient located by police at his apartment complex. IVC papers have been taken on him. Police to bring patient back to the ED.  [CV]    Clinical Course User Index [CV] Nita Sickle, MD  Pertinent labs & imaging results that were available during my care of the patient were reviewed by me and considered in my medical decision making (see chart for details).    ____________________________________________   FINAL CLINICAL IMPRESSION(S) / ED DIAGNOSES  Final diagnoses:  Altered mental status, unspecified altered mental status type      NEW MEDICATIONS STARTED DURING THIS VISIT:  Discharge Medication List as of 02/14/2017  2:35 PM       Note:  This document was prepared using Dragon voice recognition software and may include unintentional dictation errors.    Nita Sickle, MD 02/15/17 2157

## 2017-02-14 NOTE — ED Notes (Signed)
This RN notified that patient had left room without notifying this RN or MD and had gotten dressed and pulled his IV. MD notified. This RN attempted to find patient in parking lot and called floors to see if patient had been seen wandering floors, patient had not been seen. MD began IVC paperwork on patient at this time.

## 2017-02-15 DIAGNOSIS — R410 Disorientation, unspecified: Secondary | ICD-10-CM | POA: Diagnosis not present

## 2017-02-15 LAB — URINE DRUG SCREEN, QUALITATIVE (ARMC ONLY)
Amphetamines, Ur Screen: NOT DETECTED
Barbiturates, Ur Screen: NOT DETECTED
Benzodiazepine, Ur Scrn: POSITIVE — AB
Cannabinoid 50 Ng, Ur ~~LOC~~: POSITIVE — AB
Cocaine Metabolite,Ur ~~LOC~~: NOT DETECTED
MDMA (ECSTASY) UR SCREEN: NOT DETECTED
Methadone Scn, Ur: NOT DETECTED
Opiate, Ur Screen: NOT DETECTED
PHENCYCLIDINE (PCP) UR S: NOT DETECTED
Tricyclic, Ur Screen: NOT DETECTED

## 2017-02-15 MED ORDER — OLANZAPINE 5 MG PO TABS
5.0000 mg | ORAL_TABLET | Freq: Every morning | ORAL | Status: DC
  Administered 2017-02-16: 5 mg via ORAL
  Filled 2017-02-15: qty 1

## 2017-02-15 MED ORDER — OLANZAPINE 10 MG PO TABS
10.0000 mg | ORAL_TABLET | Freq: Every day | ORAL | Status: DC
  Administered 2017-02-15 – 2017-02-16 (×3): 10 mg via ORAL
  Filled 2017-02-15 (×3): qty 1

## 2017-02-15 MED ORDER — LORAZEPAM 2 MG PO TABS
2.0000 mg | ORAL_TABLET | Freq: Once | ORAL | Status: AC
  Administered 2017-02-15: 2 mg via ORAL
  Filled 2017-02-15: qty 1

## 2017-02-15 MED ORDER — ZIPRASIDONE HCL 20 MG PO CAPS
20.0000 mg | ORAL_CAPSULE | Freq: Once | ORAL | Status: AC
  Administered 2017-02-15: 20 mg via ORAL
  Filled 2017-02-15: qty 1

## 2017-02-15 MED ORDER — COLCHICINE 0.6 MG PO TABS
1.2000 mg | ORAL_TABLET | Freq: Once | ORAL | Status: AC
  Administered 2017-02-15: 1.2 mg via ORAL
  Filled 2017-02-15: qty 2

## 2017-02-15 NOTE — [facility] Counselor (Signed)
Referral information for Adult Inpatient Placement have been faxed to:    [facility] [phone])   [facility] [phone])    [facility] [phone]),    [address] [phone]),    [place] [phone])   [facility] [phone] [phone]),    [facility] [phone]),    [place] [phone]),    [facility] [phone]),    [place] [phone])   [facility] [phone])

## 2017-02-15 NOTE — ED Notes (Signed)
Lunch tray provided. 

## 2017-02-15 NOTE — ED Provider Notes (Signed)
-----------------------------------------   12:33 AM on 02/15/2017 -----------------------------------------  [facility] recommends inpatient psychiatric admission. Will put in medication recommendations.   Phineas Semen, MD 02/15/17 [phone]

## 2017-02-15 NOTE — ED Notes (Signed)
Pt ambulatory with no distress to the restroom, even unlabored respirations.

## 2017-02-15 NOTE — ED Notes (Signed)
Pt sleeping soundly, even and unlabored respirations, will continue to monitor  

## 2017-02-15 NOTE — ED Notes (Signed)
Pt sitting on bed, more fluids encouraged (approx 5 cups water since [facility] within the hour)

## 2017-02-15 NOTE — ED Provider Notes (Signed)
The patient is now awake stating his right great toe hurts and that he has a history of gout. It is a little bit tender and is consistent with podagra. I will give him a dose of colchicine now.   Merrily Brittle, MD 02/15/17 2000

## 2017-02-15 NOTE — ED Notes (Signed)
Patient is IVC and is pending placement. 

## 2017-02-16 DIAGNOSIS — F201 Disorganized schizophrenia: Secondary | ICD-10-CM | POA: Diagnosis not present

## 2017-02-16 DIAGNOSIS — R410 Disorientation, unspecified: Secondary | ICD-10-CM | POA: Diagnosis not present

## 2017-02-16 MED ORDER — BENZTROPINE MESYLATE 1 MG PO TABS
0.5000 mg | ORAL_TABLET | Freq: Two times a day (BID) | ORAL | Status: DC
  Administered 2017-02-17: 0.5 mg via ORAL
  Filled 2017-02-16: qty 1

## 2017-02-16 MED ORDER — ALLOPURINOL 300 MG PO TABS
300.0000 mg | ORAL_TABLET | Freq: Every day | ORAL | Status: DC
  Filled 2017-02-16 (×2): qty 1

## 2017-02-16 MED ORDER — HALOPERIDOL 5 MG PO TABS
5.0000 mg | ORAL_TABLET | Freq: Two times a day (BID) | ORAL | Status: DC
  Administered 2017-02-17: 5 mg via ORAL
  Filled 2017-02-16: qty 1

## 2017-02-16 MED ORDER — COLCHICINE 0.6 MG PO TABS
0.6000 mg | ORAL_TABLET | Freq: Every day | ORAL | Status: DC
  Administered 2017-02-17: 0.6 mg via ORAL
  Filled 2017-02-16 (×2): qty 1

## 2017-02-16 MED ORDER — BENAZEPRIL HCL 20 MG PO TABS
40.0000 mg | ORAL_TABLET | Freq: Every day | ORAL | Status: DC
  Administered 2017-02-17: 40 mg via ORAL
  Filled 2017-02-16 (×2): qty 2

## 2017-02-16 NOTE — ED Provider Notes (Signed)
-----------------------------------------   7:55 AM on 02/16/2017 -----------------------------------------   Blood pressure (!) 136/95, pulse 95, temperature 98.1 F (36.7 C), temperature source Oral, resp. rate 18, height 5\' 6"  (1.676 m), weight 220 lb (99.8 kg), SpO2 98 %.  The patient had no acute events since last update.  Calm and cooperative at this time.  Disposition is pending Psychiatry/Behavioral Medicine team recommendations.     Willy Eddy, MD 02/16/17 [phone]

## 2017-02-16 NOTE — ED Notes (Signed)
Pt is very pleasant but he has had a problem with his thought process and paranoia he needed a lot of direction and support to shower but he did so sucessfully

## 2017-02-16 NOTE — Progress Notes (Signed)
Patient is to be admitted to [facility] by Dr. Toni Amend.  Attending Physician will be Dr. Ardyth Harps.   Patient has been assigned to room 324 by [facility] Charge Nurse Abi.   Patient is under IVC  ER staff is aware of the admission [facility].; Dr. Fanny Bien , ER MD; Minerva Areola Patient's Nurse & Nicole Cella in Patient Access).

## 2017-02-16 NOTE — ED Notes (Signed)
Pt is alert and oriented this evening, although confused about exact location. Writer reoriented pt and encouraged rest until MD could reassess tomorrow. Pt has some thought blocking and delayed responses. Pt responds well to redirection and verbal que's and is taking medications as prescribed. 15 minute checks are ongoing for safety.
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
## 2017-02-17 ENCOUNTER — Inpatient Hospital Stay
Admit: 2017-02-17 | Discharge: 2017-02-26 | DRG: 885 | Disposition: A | Discharge status: Home / Self Care | Payer: Medicare Other | Source: Ambulatory Visit | Attending: Psychiatry | Admitting: Psychiatry

## 2017-02-17 DIAGNOSIS — G4700 Insomnia, unspecified: Secondary | ICD-10-CM | POA: Diagnosis present

## 2017-02-17 DIAGNOSIS — F1010 Alcohol abuse, uncomplicated: Secondary | ICD-10-CM | POA: Diagnosis present

## 2017-02-17 DIAGNOSIS — Z8249 Family history of ischemic heart disease and other diseases of the circulatory system: Secondary | ICD-10-CM

## 2017-02-17 DIAGNOSIS — E119 Type 2 diabetes mellitus without complications: Secondary | ICD-10-CM

## 2017-02-17 DIAGNOSIS — Z9114 Patient's other noncompliance with medication regimen: Secondary | ICD-10-CM

## 2017-02-17 DIAGNOSIS — R410 Disorientation, unspecified: Secondary | ICD-10-CM | POA: Diagnosis not present

## 2017-02-17 DIAGNOSIS — Z833 Family history of diabetes mellitus: Secondary | ICD-10-CM | POA: Diagnosis not present

## 2017-02-17 DIAGNOSIS — F1220 Cannabis dependence, uncomplicated: Secondary | ICD-10-CM | POA: Diagnosis present

## 2017-02-17 DIAGNOSIS — M109 Gout, unspecified: Secondary | ICD-10-CM | POA: Diagnosis present

## 2017-02-17 DIAGNOSIS — F203 Undifferentiated schizophrenia: Secondary | ICD-10-CM | POA: Diagnosis not present

## 2017-02-17 DIAGNOSIS — L732 Hidradenitis suppurativa: Secondary | ICD-10-CM | POA: Diagnosis present

## 2017-02-17 DIAGNOSIS — Z79899 Other long term (current) drug therapy: Secondary | ICD-10-CM

## 2017-02-17 DIAGNOSIS — Z9119 Patient's noncompliance with other medical treatment and regimen: Secondary | ICD-10-CM

## 2017-02-17 DIAGNOSIS — F201 Disorganized schizophrenia: Principal | ICD-10-CM | POA: Diagnosis present

## 2017-02-17 DIAGNOSIS — F17210 Nicotine dependence, cigarettes, uncomplicated: Secondary | ICD-10-CM | POA: Diagnosis present

## 2017-02-17 DIAGNOSIS — F1020 Alcohol dependence, uncomplicated: Secondary | ICD-10-CM

## 2017-02-17 DIAGNOSIS — F209 Schizophrenia, unspecified: Secondary | ICD-10-CM | POA: Diagnosis present

## 2017-02-17 DIAGNOSIS — I10 Essential (primary) hypertension: Secondary | ICD-10-CM | POA: Diagnosis present

## 2017-02-17 DIAGNOSIS — Z91199 Patient's noncompliance with other medical treatment and regimen due to unspecified reason: Secondary | ICD-10-CM

## 2017-02-17 DIAGNOSIS — F17200 Nicotine dependence, unspecified, uncomplicated: Secondary | ICD-10-CM | POA: Diagnosis present

## 2017-02-17 LAB — LIPID PANEL
Cholesterol: 118 mg/dL (ref 0–200)
HDL: 25 mg/dL — AB (ref >40)
LDL CALC: 70 mg/dL (ref 0–99)
TRIGLYCERIDES: 117 mg/dL (ref <150)
Total CHOL/HDL Ratio: 4.7 RATIO
VLDL: 23 mg/dL (ref 0–40)

## 2017-02-17 LAB — TROPONIN I: Troponin I: <0.03 ng/mL (ref <0.03)

## 2017-02-17 LAB — TSH: TSH: 0.98 u[IU]/mL (ref 0.350–4.500)

## 2017-02-17 LAB — CKMB (ARMC ONLY): CK, MB: 5.6 ng/mL — ABNORMAL HIGH (ref 0.5–5.0)

## 2017-02-17 MED ORDER — MAGNESIUM HYDROXIDE 400 MG/5ML PO SUSP: 30.0000 mL | Freq: Every day | ORAL | Status: DC | PRN

## 2017-02-17 MED ORDER — COLCHICINE 0.6 MG PO TABS
0.6000 mg | ORAL_TABLET | Freq: Every day | ORAL | Status: AC
Start: 2017-02-17 — End: 2017-02-20
  Administered 2017-02-18 – 2017-02-19 (×2): 0.6 mg via ORAL
  Filled 2017-02-17 (×5): qty 1

## 2017-02-17 MED ORDER — AMLODIPINE BESYLATE 5 MG PO TABS
5.0000 mg | ORAL_TABLET | Freq: Every day | ORAL | Status: DC
  Administered 2017-02-17 – 2017-02-18 (×2): 5 mg via ORAL
  Filled 2017-02-17 (×2): qty 1

## 2017-02-17 MED ORDER — PALIPERIDONE ER 3 MG PO TB24
9.0000 mg | ORAL_TABLET | Freq: Every day | ORAL | Status: DC
  Administered 2017-02-17 – 2017-02-18 (×2): 9 mg via ORAL
  Filled 2017-02-17 (×2): qty 3

## 2017-02-17 MED ORDER — NICOTINE 21 MG/24HR TD PT24
21.0000 mg | MEDICATED_PATCH | Freq: Every day | TRANSDERMAL | Status: DC
  Administered 2017-02-17 – 2017-02-24 (×5): 21 mg via TRANSDERMAL
  Filled 2017-02-17 (×9): qty 1

## 2017-02-17 MED ORDER — ACETAMINOPHEN 325 MG PO TABS
650.0000 mg | ORAL_TABLET | Freq: Four times a day (QID) | ORAL | Status: DC | PRN
  Administered 2017-02-25: 650 mg via ORAL
  Filled 2017-02-17: qty 2

## 2017-02-17 MED ORDER — INDOMETHACIN 50 MG PO CAPS
50.0000 mg | ORAL_CAPSULE | Freq: Three times a day (TID) | ORAL | Status: DC
  Administered 2017-02-18 – 2017-02-20 (×7): 50 mg via ORAL
  Filled 2017-02-17 (×9): qty 1

## 2017-02-17 MED ORDER — LORAZEPAM 1 MG PO TABS
1.0000 mg | ORAL_TABLET | Freq: Every day | ORAL | Status: DC
  Administered 2017-02-17 – 2017-02-19 (×3): 1 mg via ORAL
  Filled 2017-02-17 (×3): qty 1

## 2017-02-17 MED ORDER — ALLOPURINOL 100 MG PO TABS
100.0000 mg | ORAL_TABLET | Freq: Every day | ORAL | Status: DC
  Administered 2017-02-18 – 2017-02-26 (×9): 100 mg via ORAL
  Filled 2017-02-17 (×11): qty 1

## 2017-02-17 MED ORDER — ALUM & MAG HYDROXIDE-SIMETH 200-200-20 MG/5ML PO SUSP: 30.0000 mL | ORAL | Status: DC | PRN

## 2017-02-17 NOTE — H&P (Signed)
Psychiatric Admission Assessment Adult  Patient Identification: Walter Pena. MRN:  [id number] Date of Evaluation:  02/17/2017 Chief Complaint:  Disorganized Schzophrenia Principal Diagnosis: Schizophrenia (HCC) Diagnosis:   Patient Active Problem List   Diagnosis Date Noted  . Schizophrenia (HCC) [F20.9] 02/17/2017  . Alcohol use disorder (HCC) [F10.99] 02/17/2017  . Noncompliance [Z91.19] 05/07/2016  . HTN (hypertension) [I10] 01/21/2016  . Cannabis use disorder, moderate, dependence (HCC) [F12.20] 01/21/2016  . Tobacco use disorder [F17.200] 01/21/2016  . Gout [M10.9] 01/21/2016  . Hidradenitis [L73.2] 01/21/2016   History of Present Illness:   Patient is a 36 year old single African-American male from [facility] [place] who carries a diagnosis of schizophrenia.  Patient was brought in by police to our emergency department on March 31 after he was found wandering in his apartment complex barefoot and flashing his genitals.  Patient eloped from the emergency department. Later on he was brought back by police after he was placed on involuntary commitment.  Patient told the ER physician that he was at a football game. Per collateral information obtained from his mother: "Pt lives independently but has not paid his bills and now his lights are turned off and he has received eviction notice.  Pt was found walking in the middle of the street today and when he starts to decompensate he begins to walk all over the place and also starts to get angry a lot.  This is how pt typically acts when he is not taking his medicine and this has happened a number of times before.  Pt history includes several group homes and several admits to [facility].  Mother has also smelled marijuana in pt's apartment recently and believes he is smoking again."  Today during assessment the patient displayed some episodes of thought blocking. At times and of his answers were completely unrelated to the questions. He  tells me he has been diagnosed with schizophrenia and receives disability. He follows up with Dr. Franz Dell at [facility]. Patient is unable to remember the name of the medications he is prescribed with for schizophrenia. He claims that he has been taking medications daily as prescribed.  Patient is unable to tell me what brought him to the hospital. He does not feel he needs to be.  Today the patient denies having any issues with depression, problems with sleep appetite, energy or concentration. He denies suicidality, homicidality or auditory or visual hallucinations.  He reports drinking half a gallon of liquor per day. However he is not withdrawing and his toxicology screen was negative for alcohol. AST is only minimally elevated at 50. Patient says he has been smoking marijuana daily. He smokes about one pack of cigarettes per day.  Trauma this was not addressed during this assessment as the patient was a limited historian and displayed thought blocking  Per records his last hospitalization in our unit was in 2017. He was discharged on Haldol oral and Haldol decanoate.   Associated Signs/Symptoms: Depression Symptoms:  denies (Hypo) Manic Symptoms:  denies Anxiety Symptoms:  denies Psychotic Symptoms:  disorganized thought process PTSD Symptoms: NA Total Time spent with patient: 1 hour  Past Psychiatric History: Patient reports being diagnosed with schizophrenia in 1995. States that he has been hospitalized multiple times since the age of 89. He denies any history of self injury or suicidal attempts.  Is the patient at risk to self? Yes.    Has the patient been a risk to self in the past 6 months? No.  Has the patient  been a risk to self within the distant past? No.  Is the patient a risk to others? No.  Has the patient been a risk to others in the past 6 months? No.  Has the patient been a risk to others within the distant past? No.    Alcohol Screening: 1. How often do you have a drink  containing alcohol?: Monthly or less 2. How many drinks containing alcohol do you have on a typical day when you are drinking?: 1 or 2 3. How often do you have six or more drinks on one occasion?: Never Preliminary Score: 0 4. How often during the last year have you found that you were not able to stop drinking once you had started?: Never 5. How often during the last year have you failed to do what was normally expected from you becasue of drinking?: Never 6. How often during the last year have you needed a first drink in the morning to get yourself going after a heavy drinking session?: Never 7. How often during the last year have you had a feeling of guilt of remorse after drinking?: Never 8. How often during the last year have you been unable to remember what happened the night before because you had been drinking?: Never 9. Have you or someone else been injured as a result of your drinking?: No 10. Has a relative or friend or a doctor or another health worker been concerned about your drinking or suggested you cut down?: No Alcohol Use Disorder Identification Test Final Score (AUDIT): 1 Brief Intervention: AUDIT score less than 7 or less-screening does not suggest unhealthy drinking-brief intervention not indicated  Past Medical History:  Past Medical History:  Diagnosis Date  . Gout   . Hidradenitis   . Hypertension   . Schizophrenia [facility])     Past Surgical History:  Procedure Laterality Date  . sweat gland removal     Family History:  Family History  Problem Relation Age of Onset  . Diabetes Mellitus II Mother   . Hypertension Mother    Family Psychiatric  History: Patient denies having any family history of suicide, mental illness or substance abuse.  Tobacco Screening: Have you used any form of tobacco in the last 30 days? (Cigarettes, Smokeless Tobacco, Cigars, and/or Pipes): Yes Tobacco use, Select all that apply: 5 or more cigarettes per day Are you interested in  Tobacco Cessation Medications?: No, patient refused Counseled patient on smoking cessation including recognizing danger situations, developing coping skills and basic information about quitting provided: Yes (Nicotine Patch 21 mg recommended)  Social History: Patient lives by himself in an apartment. He is disabled due to mental illness. He is single, never married. Doesn't have any children. He denies having any legal history. As far as his education he dropped out of the 11th grade History  Alcohol Use  . Yes     History  Drug Use  . Types: Marijuana    Additional Social History: Marital status: Single Are you sexually active?: Yes What is your sexual orientation?: heterosexual Does patient have children?: No     Allergies:   Allergies  Allergen Reactions  . Bactrim [Sulfamethoxazole-Trimethoprim] Nausea And Vomiting  . Lisinopril Swelling  . Other Rash    aloe   Lab Results:  Results for orders placed or performed during the hospital encounter of 02/17/17 (from the past 48 hour(s))  Lipid panel     Status: Abnormal   Collection Time: 02/17/17  6:45 AM  Result  Value Ref Range   Cholesterol 118 0 - 200 mg/dL   Triglycerides 409 <811 mg/dL   HDL 25 (L) >91 mg/dL   Total CHOL/HDL Ratio 4.7 RATIO   VLDL 23 0 - 40 mg/dL   LDL Cholesterol 70 0 - 99 mg/dL    Comment:        Total Cholesterol/HDL:CHD Risk Coronary Heart Disease Risk Table                     Men   Women  1/2 Average Risk   3.4   3.3  Average Risk       5.0   4.4  2 X Average Risk   9.6   7.1  3 X Average Risk  23.4   11.0        Use the calculated Patient Ratio above and the CHD Risk Table to determine the patient's CHD Risk.        ATP III CLASSIFICATION (LDL):  <100     mg/dL   Optimal  478-295  mg/dL   Near or Above                    Optimal  130-159  mg/dL   Borderline  621-308  mg/dL   High  >657     mg/dL   Very High   TSH     Status: None   Collection Time: 02/17/17  6:45 AM  Result  Value Ref Range   TSH 0.980 0.350 - 4.500 uIU/mL    Comment: Performed by a 3rd Generation assay with a functional sensitivity of <=0.01 uIU/mL.  Troponin I (q 6hr x 3)     Status: None   Collection Time: 02/17/17  6:45 AM  Result Value Ref Range   Troponin I <0.03 <0.03 ng/mL  CKMB([facility] only)     Status: Abnormal   Collection Time: 02/17/17  6:45 AM  Result Value Ref Range   CK, MB 5.6 (H) 0.5 - 5.0 ng/mL    Blood Alcohol level:  Lab Results  Component Value Date   ETH <5 02/14/2017   ETH <5 05/07/2016    Metabolic Disorder Labs:  Lab Results  Component Value Date   HGBA1C 6.2 (H) 05/08/2016   Lab Results  Component Value Date   PROLACTIN 27.2 (H) 05/08/2016   PROLACTIN 6.9 01/19/2016   Lab Results  Component Value Date   CHOL 118 02/17/2017   TRIG 117 02/17/2017   HDL 25 (L) 02/17/2017   CHOLHDL 4.7 02/17/2017   VLDL 23 02/17/2017   LDLCALC 70 02/17/2017   LDLCALC 68 05/08/2016    Current Medications: Current Facility-Administered Medications  Medication Dose Route Frequency Provider Last Rate Last Dose  . acetaminophen (TYLENOL) tablet 650 mg  650 mg Oral Q6H PRN Audery Amel, MD      . allopurinol (ZYLOPRIM) tablet 100 mg  100 mg Oral Daily Jimmy Footman, MD      . alum & mag hydroxide-simeth (MAALOX/MYLANTA) 200-200-20 MG/5ML suspension 30 mL  30 mL Oral Q4H PRN Audery Amel, MD      . amLODipine (NORVASC) tablet 5 mg  5 mg Oral Daily Jimmy Footman, MD      . colchicine tablet 0.6 mg  0.6 mg Oral Daily Jimmy Footman, MD      . LORazepam (ATIVAN) tablet 1 mg  1 mg Oral QHS Jimmy Footman, MD      . magnesium hydroxide (MILK OF MAGNESIA) suspension  30 mL  30 mL Oral Daily PRN Audery Amel, MD      . nicotine (NICODERM CQ - dosed in mg/24 hours) patch 21 mg  21 mg Transdermal Daily Jimmy Footman, MD   21 mg at 02/17/17 0900  . paliperidone (INVEGA) 24 hr tablet 9 mg  9 mg Oral Daily Jimmy Footman, MD       PTA Medications: Prescriptions Prior to Admission  Medication Sig Dispense Refill Last Dose  . allopurinol (ZYLOPRIM) 100 MG tablet Take 100 mg by mouth daily.     Marland Kitchen amLODipine (NORVASC) 10 MG tablet Take 10 mg by mouth daily.     . benazepril (LOTENSIN) 40 MG tablet Take 1 tablet by mouth daily.     . benztropine (COGENTIN) 1 MG tablet Take 1 mg by mouth daily.   Taking  . colchicine 0.6 MG tablet 1 tablet by mouth daily until gout pain is gone. 15 tablet 0 Taking    Musculoskeletal: Strength & Muscle Tone: within normal limits Gait & Station: normal Patient leans: N/A  Psychiatric Specialty Exam: Physical Exam  Constitutional: He is oriented to person, place, and time. He appears well-developed and well-nourished.  HENT:  Head: Normocephalic and atraumatic.  Eyes: Conjunctivae and EOM are normal.  Neck: Normal range of motion.  Respiratory: Effort normal.  Musculoskeletal: Normal range of motion.  Neurological: He is alert and oriented to person, place, and time.    Review of Systems  Constitutional: Negative.   HENT: Negative.   Eyes: Negative.   Respiratory: Negative.   Cardiovascular: Negative.   Musculoskeletal: Positive for joint pain.  Skin: Negative.   Neurological: Negative.   Endo/Heme/Allergies: Negative.   Psychiatric/Behavioral: Positive for substance abuse. Negative for depression, hallucinations, memory loss and suicidal ideas. The patient is not nervous/anxious and does not have insomnia.     Blood pressure 124/82, pulse 70, temperature 98.8 F (37.1 C), temperature source Oral, resp. rate 16, height 5\' 6"  (1.676 m), weight 96.2 kg (212 lb), SpO2 100 %.Body mass index is 34.22 kg/m.  General Appearance: Disheveled  Eye Contact:  Minimal  Speech:  Slow  Volume:  Decreased  Mood:  Dysphoric  Affect:  Constricted  Thought Process:  Disorganized and Descriptions of Associations: Loose  Orientation:  Full (Time, Place, and  Person)  Thought Content:  Illogical  Suicidal Thoughts:  No  Homicidal Thoughts:  No  Memory:  Immediate;   Poor Recent;   Poor Remote;   Fair  Judgement:  Impaired  Insight:  Lacking  Psychomotor Activity:  Decreased  Concentration:  Concentration: Poor and Attention Span: Poor  Recall:  Poor  Fund of Knowledge:  Poor  Language:  Fair  Akathisia:  No  Handed:    AIMS (if indicated):     Assets:  Architect Housing  ADL's:  Intact  Cognition:  Impaired,  Mild  Sleep:  Number of Hours: 0    Treatment Plan Summary: Daily contact with patient to assess and evaluate symptoms and progress in treatment and Medication management   Patient is a 36 year old African-American male with schizophrenia. He has a long history of poor compliance with medication and substance abuse.  Patient presented to our emergency department due to disorganized and bizarre behavior likely in the setting of noncompliance.  Schizophrenia: I will restart the patient on Invega 9 mg by mouth daily at bedtime. He is in need of a long acting injectable. Looks like back in June of last year  he was treated with Haldol decanoate. Patient is unable to tell me whether or not he had any problems or side effects with haloperidol.   For insomnia I will start him on Ativan 1 mg by mouth daily at bedtime  Cannabis use disorder, alcohol use disorder: Consider referral to intensive outpatient substance abuse treatment upon discharge.  For hypertension will start him on a low dose of Norvasc as his blood pressure today is within the normal limits  For gout I will start him on colchicine and allopurinol as the patient appears to be having any acute attack. Patient is complaining of pain on  first toes  For tobacco use disorder I will order nicotine patch 21 mg  Diet low sodium  Precautions every 15 minute checks  Vital signs daily  Hospitalization status involuntary  commitment  Labs I will order hemoglobin A1c, lipid panel and TSH  Disposition patient will be discharged back to his apartment  Follow-up patient will continue to follow-up with [facility].   Physician Treatment Plan for Primary Diagnosis: Schizophrenia (HCC) Long Term Goal(s): Improvement in symptoms so as ready for discharge  Short Term Goals: Compliance with prescribed medications will improve and Ability to identify triggers associated with substance abuse/mental health issues will improve  Physician Treatment Plan for Secondary Diagnosis: Principal Problem:   Schizophrenia (HCC) Active Problems:   HTN (hypertension)   Cannabis use disorder, moderate, dependence (HCC)   Tobacco use disorder   Gout   Hidradenitis   Noncompliance   Alcohol use disorder (HCC)  Long Term Goal(s): Improvement in symptoms so as ready for discharge  Short Term Goals: Ability to identify changes in lifestyle to reduce recurrence of condition will improve and Ability to demonstrate self-control will improve  I certify that inpatient services furnished can reasonably be expected to improve the patient's condition.    Jimmy Footman, MD 4/3/20182:02 PM

## 2017-02-17 NOTE — Progress Notes (Signed)
Affect flat.  Stays to himself.  Forwards little. Support and encouragement offered.  Safety maintained.

## 2017-02-17 NOTE — Progress Notes (Signed)
Arrived to unit for Odd and Bizarre behavior, indecent exposures at his apartment complex, IVC, h/o CPS, disorganized thoughts, loose association: tangential type; poorly kept skin condition and appearance, presented with significant malodorous and stenched body odor.  Skin assessment and contraband search completed, no open wounds, no contrabands found on patient and his belongings. Oriented to the unit and room.
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
## 2017-02-17 NOTE — BHH Suicide Risk Assessment (Signed)
[facility] Admission Suicide Risk Assessment   Nursing information obtained from:    Demographic factors:    Current Mental Status:    Loss Factors:    Historical Factors:    Risk Reduction Factors:     Total Time spent with patient: 1 hour Principal Problem: Schizophrenia (HCC) Diagnosis:   Patient Active Problem List   Diagnosis Date Noted  . Schizophrenia (HCC) [F20.9] 02/17/2017  . Alcohol use disorder (HCC) [F10.99] 02/17/2017  . Noncompliance [Z91.19] 05/07/2016  . HTN (hypertension) [I10] 01/21/2016  . Cannabis use disorder, moderate, dependence (HCC) [F12.20] 01/21/2016  . Tobacco use disorder [F17.200] 01/21/2016  . Gout [M10.9] 01/21/2016  . Hidradenitis [L73.2] 01/21/2016   Subjective Data:   Continued Clinical Symptoms:  Alcohol Use Disorder Identification Test Final Score (AUDIT): 1 The "Alcohol Use Disorders Identification Test", Guidelines for Use in Primary Care, Second Edition.  World Science writer [facility]). Score between 0-7:  no or low risk or alcohol related problems. Score between 8-15:  moderate risk of alcohol related problems. Score between 16-19:  high risk of alcohol related problems. Score 20 or above:  warrants further diagnostic evaluation for alcohol dependence and treatment.   CLINICAL FACTORS:   Alcohol/Substance Abuse/Dependencies Schizophrenia:   Less than 30 years old More than one psychiatric diagnosis Currently Psychotic Previous Psychiatric Diagnoses and Treatments    Psychiatric Specialty Exam: Physical Exam  ROS  Blood pressure 124/82, pulse 70, temperature 98.8 F (37.1 C), temperature source Oral, resp. rate 16, height 5\' 6"  (1.676 m), weight 96.2 kg (212 lb), SpO2 100 %.Body mass index is 34.22 kg/m.                                                    Sleep:  Number of Hours: 0      COGNITIVE FEATURES THAT CONTRIBUTE TO RISK:  Loss of executive function    SUICIDE RISK:   Moderate:  Frequent  suicidal ideation with limited intensity, and duration, some specificity in terms of plans, no associated intent, good self-control, limited dysphoria/symptomatology, some risk factors present, and identifiable protective factors, including available and accessible social support.  PLAN OF CARE: admit to [facility]  I certify that inpatient services furnished can reasonably be expected to improve the patient's condition.   Jimmy Footman, MD 02/17/2017, 2:01 PM

## 2017-02-17 NOTE — Tx Team (Signed)
Initial Treatment Plan 02/17/2017 6:35 AM Walter Pena. ZOX:[id number]    PATIENT STRESSORS: Medication change or noncompliance Substance abuse   PATIENT STRENGTHS: Capable of independent living Motivation for treatment/growth   PATIENT IDENTIFIED PROBLEMS: Thoughts Impaired, Odd and Bizarre Behaviors  Treatment and Medication Noncompliance  Substance Abuse-Cannabis Use D/O                 DISCHARGE CRITERIA:  Improved stabilization in mood, thinking, and/or behavior Verbal commitment to aftercare and medication compliance  PRELIMINARY DISCHARGE PLAN: Return to previous living arrangement  PATIENT/FAMILY INVOLVEMENT: This treatment plan has been presented to and reviewed with the patient, Walter Pena..  The patient and family have been given the opportunity to ask questions and make suggestions.  Cleotis Nipper, RN 02/17/2017, 6:35 AM

## 2017-02-17 NOTE — Plan of Care (Signed)
Problem: Coping: Goal: Ability to verbalize feelings will improve Outcome: Not Progressing forwards little

## 2017-02-17 NOTE — Progress Notes (Signed)
Critical EKG given to RN

## 2017-02-17 NOTE — Plan of Care (Signed)
Problem: Activity: Goal: Sleeping patterns will improve Outcome: Progressing Patient slept for Estimated Hours of Zero due to New Admission; every 15 minutes safety round maintained, no injury or falls during this shift. Cardiac Enzyme placed due to abnormal EKG indicating Acute MI.

## 2017-02-17 NOTE — Progress Notes (Signed)
Pt is alert but requires come reorientation and reinforcement of current circumstances. Pt mood is depressed and his affect is flat/blunted, behavior is somewhat bizarre and he exhibits thought blocking and ambivalence as well. However, pt does respond well to redirection and prompting by staff. Pt preoccupied with leaving but is ultimately accepting of situation. Writer discussed tx plan and encouraged pt to continue participating with staff and tx team during his stay. Pt is taking medications as prescribed at this time, although he is hesitant at first. Pt denies SI/HI and AVH and 15 minute checks are ongoing for safety.

## 2017-02-17 NOTE — BHH Group Notes (Signed)
BHH LCSW Group Therapy Note  Date/Time: 02/17/17, 1500  Type of Therapy/Topic:  Group Therapy:  Feelings about Diagnosis  Participation Level:  Minimal   Mood: somewhat iritated   Description of Group:    This group will allow patients to explore their thoughts and feelings about diagnoses they have received. Patients will be guided to explore their level of understanding and acceptance of these diagnoses. Facilitator will encourage patients to process their thoughts and feelings about the reactions of others to their diagnosis, and will guide patients in identifying ways to discuss their diagnosis with significant others in their lives. This group will be process-oriented, with patients participating in exploration of their own experiences as well as giving and receiving support and challenge from other group members.   Therapeutic Goals: 1. Patient will demonstrate understanding of diagnosis as evidence by identifying two or more symptoms of the disorder:  2. Patient will be able to express two feelings regarding the diagnosis 3. Patient will demonstrate ability to communicate their needs through discussion and/or role plays  Summary of Patient Progress: Pt made several comments during group related to symptoms of bipolar and depression.  Pt was engaged in group discussion, although he did not speak much.  Towards the end, pt stated to CSW that he had "learned all this in lamaze class" and excused himself.        Therapeutic Modalities:   Cognitive Behavioral Therapy Brief Therapy Feelings Identification   Daleen Squibb, LCSW

## 2017-02-18 DIAGNOSIS — E119 Type 2 diabetes mellitus without complications: Secondary | ICD-10-CM

## 2017-02-18 LAB — HEMOGLOBIN A1C
HEMOGLOBIN A1C: 6.5 % — AB (ref 4.8–5.6)
Mean Plasma Glucose: 140 mg/dL

## 2017-02-18 MED ORDER — AMLODIPINE BESYLATE 5 MG PO TABS
5.0000 mg | ORAL_TABLET | Freq: Once | ORAL | Status: AC
  Administered 2017-02-18: 5 mg via ORAL
  Filled 2017-02-18: qty 1

## 2017-02-18 MED ORDER — AMLODIPINE BESYLATE 5 MG PO TABS
10.0000 mg | ORAL_TABLET | Freq: Every day | ORAL | Status: DC
  Administered 2017-02-19: 10 mg via ORAL
  Administered 2017-02-20: 5 mg via ORAL
  Administered 2017-02-21 – 2017-02-26 (×6): 10 mg via ORAL
  Filled 2017-02-18 (×8): qty 2

## 2017-02-18 MED ORDER — PALIPERIDONE ER 3 MG PO TB24
12.0000 mg | ORAL_TABLET | Freq: Every day | ORAL | Status: DC
  Administered 2017-02-19 – 2017-02-20 (×2): 12 mg via ORAL
  Filled 2017-02-18 (×2): qty 4

## 2017-02-18 NOTE — Progress Notes (Addendum)
[facility] MD Progress Note  02/18/2017 1:03 PM Walter Pena.  MRN:  [id number] Subjective: Patient is a 36 year old single African-American male from [facility] who carries a diagnosis of schizophrenia.  Patient was brought in by police to our emergency department on March 31 after he was found wandering in his apartment complex barefoot and flashing his genitals.  Patient eloped from the emergency department. Later on he was brought back by police after he was placed on involuntary commitment.  Patient told the ER physician that he was at a football game. Per collateral information obtained from his mother: "Pt lives independently but has not paid his bills and now his lights are turned off and he has received eviction notice. Pt was found walking in the middle of the street today and when he starts to decompensate he begins to walk all over the place and also starts to get angry a lot. This is how pt typically acts when he is not taking his medicine and this has happened a number of times before. Pt history includes several group homes and several admits to [facility]. Mother has also smelled marijuana in pt's apartment recently and believes he is smoking again."  02/17/17 during assessment the patient displayed some episodes of thought blocking. At times and of his answers were completely unrelated to the questions. He tells me he has been diagnosed with schizophrenia and receives disability. He follows up with Dr. Franz Dell at [facility]. Patient is unable to remember the name of the medications he is prescribed with for schizophrenia. He claims that he has been taking medications daily as prescribed.  02/18/17 Pt continues to have thought blocking and sometimes his answers are disorganized and not related to the questions. Wandering around the unit starring at nurses. He denies any psychotic symptoms or depression.    Per nursing: Pt is alert but requires come reorientation and reinforcement of  current circumstances. Pt mood is depressed and his affect is flat/blunted, behavior is somewhat bizarre and he exhibits thought blocking and ambivalence as well. However, pt does respond well to redirection and prompting by staff. Pt preoccupied with leaving but is ultimately accepting of situation. Writer discussed tx plan and encouraged pt to continue participating with staff and tx team during his stay. Pt is taking medications as prescribed at this time, although he is hesitant at first. Pt denies SI/HI and AVH and 15 minute checks are ongoing for safety.   Principal Problem: Schizophrenia (HCC) Diagnosis:   Patient Active Problem List   Diagnosis Date Noted  . Schizophrenia (HCC) [F20.9] 02/17/2017  . Alcohol use disorder (HCC) [F10.99] 02/17/2017  . Noncompliance [Z91.19] 05/07/2016  . HTN (hypertension) [I10] 01/21/2016  . Cannabis use disorder, moderate, dependence (HCC) [F12.20] 01/21/2016  . Tobacco use disorder [F17.200] 01/21/2016  . Gout [M10.9] 01/21/2016  . Hidradenitis [L73.2] 01/21/2016   Total Time spent with patient: 30 minutes  Past Psychiatric History: Patient reports being diagnosed with schizophrenia in 1995. States that he has been hospitalized multiple times since the age of 70. He denies any history of self injury or suicidal attempts.  Past Medical History:  Past Medical History:  Diagnosis Date  . Gout   . Hidradenitis   . Hypertension   . Schizophrenia [facility])     Past Surgical History:  Procedure Laterality Date  . sweat gland removal     Family History:  Family History  Problem Relation Age of Onset  . Diabetes Mellitus II Mother   .  Hypertension Mother    Family Psychiatric  History: Patient denies having any family history of suicide, mental illness or substance abuse.  Social History: Patient lives by himself in an apartment. He is disabled due to mental illness. He is single, never married. Doesn't have any children. He denies having any legal  history. As far as his education he dropped out of the 11th grade History  Alcohol Use  . Yes     History  Drug Use  . Types: Marijuana    Social History   Social History  . Marital status: Single    Spouse name: N/A  . Number of children: N/A  . Years of education: N/A   Social History Main Topics  . Smoking status: Current Every Day Smoker    Packs/day: 1.00    Years: 9.00    Types: Cigarettes  . Smokeless tobacco: Never Used  . Alcohol use Yes  . Drug use: Yes    Types: Marijuana  . Sexual activity: Not Asked   Other Topics Concern  . None   Social History Narrative  . None     Current Medications: Current Facility-Administered Medications  Medication Dose Route Frequency Provider Last Rate Last Dose  . acetaminophen (TYLENOL) tablet 650 mg  650 mg Oral Q6H PRN Audery Amel, MD      . allopurinol (ZYLOPRIM) tablet 100 mg  100 mg Oral Daily Jimmy Footman, MD   100 mg at 02/18/17 0745  . alum & mag hydroxide-simeth (MAALOX/MYLANTA) 200-200-20 MG/5ML suspension 30 mL  30 mL Oral Q4H PRN Audery Amel, MD      . amLODipine (NORVASC) tablet 5 mg  5 mg Oral Daily Jimmy Footman, MD   5 mg at 02/18/17 0745  . colchicine tablet 0.6 mg  0.6 mg Oral Daily Jimmy Footman, MD   0.6 mg at 02/18/17 0745  . indomethacin (INDOCIN) capsule 50 mg  50 mg Oral TID WC Jimmy Footman, MD   50 mg at 02/18/17 1143  . LORazepam (ATIVAN) tablet 1 mg  1 mg Oral QHS Jimmy Footman, MD   1 mg at 02/17/17 2104  . magnesium hydroxide (MILK OF MAGNESIA) suspension 30 mL  30 mL Oral Daily PRN Audery Amel, MD      . nicotine (NICODERM CQ - dosed in mg/24 hours) patch 21 mg  21 mg Transdermal Daily Jimmy Footman, MD   21 mg at 02/17/17 0900  . paliperidone (INVEGA) 24 hr tablet 9 mg  9 mg Oral Daily Jimmy Footman, MD   9 mg at 02/18/17 0745    Lab Results:  Results for orders placed or performed during the  hospital encounter of 02/17/17 (from the past 48 hour(s))  Hemoglobin A1c     Status: Abnormal   Collection Time: 02/17/17  6:45 AM  Result Value Ref Range   Hgb A1c MFr Bld 6.5 (H) 4.8 - 5.6 %    Comment: (NOTE)         Pre-diabetes: 5.7 - 6.4         Diabetes: >6.4         Glycemic control for adults with diabetes: <7.0    Mean Plasma Glucose 140 mg/dL    Comment: (NOTE) Performed At: [facility] [address] [place], [place] [id number] Mila Homer MD [id number]   Lipid panel     Status: Abnormal   Collection Time: 02/17/17  6:45 AM  Result Value Ref Range   Cholesterol 118 0 -  200 mg/dL   Triglycerides 161 <096 mg/dL   HDL 25 (L) >04 mg/dL   Total CHOL/HDL Ratio 4.7 RATIO   VLDL 23 0 - 40 mg/dL   LDL Cholesterol 70 0 - 99 mg/dL    Comment:        Total Cholesterol/HDL:CHD Risk Coronary Heart Disease Risk Table                     Men   Women  1/2 Average Risk   3.4   3.3  Average Risk       5.0   4.4  2 X Average Risk   9.6   7.1  3 X Average Risk  23.4   11.0        Use the calculated Patient Ratio above and the CHD Risk Table to determine the patient's CHD Risk.        ATP III CLASSIFICATION (LDL):  <100     mg/dL   Optimal  540-981  mg/dL   Near or Above                    Optimal  130-159  mg/dL   Borderline  191-478  mg/dL   High  >295     mg/dL   Very High   TSH     Status: None   Collection Time: 02/17/17  6:45 AM  Result Value Ref Range   TSH 0.980 0.350 - 4.500 uIU/mL    Comment: Performed by a 3rd Generation assay with a functional sensitivity of <=0.01 uIU/mL.  Troponin I (q 6hr x 3)     Status: None   Collection Time: 02/17/17  6:45 AM  Result Value Ref Range   Troponin I <0.03 <0.03 ng/mL  CKMB([facility] only)     Status: Abnormal   Collection Time: 02/17/17  6:45 AM  Result Value Ref Range   CK, MB 5.6 (H) 0.5 - 5.0 ng/mL    Blood Alcohol level:  Lab Results  Component Value Date   ETH <5 02/14/2017   ETH <5 05/07/2016     Metabolic Disorder Labs: Lab Results  Component Value Date   HGBA1C 6.5 (H) 02/17/2017   MPG 140 02/17/2017   Lab Results  Component Value Date   PROLACTIN 27.2 (H) 05/08/2016   PROLACTIN 6.9 01/19/2016   Lab Results  Component Value Date   CHOL 118 02/17/2017   TRIG 117 02/17/2017   HDL 25 (L) 02/17/2017   CHOLHDL 4.7 02/17/2017   VLDL 23 02/17/2017   LDLCALC 70 02/17/2017   LDLCALC 68 05/08/2016    Physical Findings: AIMS:  , ,  ,  ,    CIWA:    COWS:     Musculoskeletal: Strength & Muscle Tone: within normal limits Gait & Station: normal Patient leans: N/A  Psychiatric Specialty Exam: Physical Exam  Constitutional: He is oriented to person, place, and time. He appears well-developed and well-nourished.  HENT:  Head: Normocephalic and atraumatic.  Eyes: Conjunctivae and EOM are normal.  Neck: Normal range of motion.  Respiratory: Effort normal.  Musculoskeletal: Normal range of motion.  Neurological: He is alert and oriented to person, place, and time.    Review of Systems  Constitutional: Negative.   HENT: Negative.   Eyes: Negative.   Respiratory: Negative.   Cardiovascular: Negative.   Gastrointestinal: Negative.   Genitourinary: Negative.   Musculoskeletal: Negative.   Skin: Negative.   Neurological: Negative.   Endo/Heme/Allergies: Negative.  Psychiatric/Behavioral: Positive for substance abuse. Negative for depression, hallucinations, memory loss and suicidal ideas. The patient is not nervous/anxious and does not have insomnia.     Blood pressure (!) 143/82, pulse 67, temperature 98.7 F (37.1 C), resp. rate 16, height 5\' 6"  (1.676 m), weight 96.2 kg (212 lb), SpO2 100 %.Body mass index is 34.22 kg/m.  General Appearance: Fairly Groomed  Eye Contact:  Minimal  Speech:  Slow  Volume:  Decreased  Mood:  Dysphoric  Affect:  Blunt  Thought Process:  Disorganized  Orientation:  Full (Time, Place, and Person)  Thought Content:  Illogical   Suicidal Thoughts:  No  Homicidal Thoughts:  No  Memory:  Immediate;   Poor Recent;   Poor Remote;   Poor  Judgement:  Impaired  Insight:  Lacking  Psychomotor Activity:  Decreased  Concentration:  Concentration: Poor and Attention Span: Poor  Recall:  Poor  Fund of Knowledge:  Poor  Language:  Fair  Akathisia:  No  Handed:    AIMS (if indicated):     Assets:  Manufacturing systems engineer Social Support  ADL's:  Intact  Cognition:  WNL  Sleep:  Number of Hours: 7     Treatment Plan Summary:  Patient is a 36 year old African-American male with schizophrenia. He has a long history of poor compliance with medication and substance abuse.  Patient presented to our emergency department due to disorganized and bizarre behavior likely in the setting of noncompliance.  Schizophrenia: The patient has been started on Invega. I will increase the Invega from 9 mg to 12 mg this evening. He is in need of a long acting injectable. Looks like back in June of last year he was treated with Haldol decanoate. Patient is unable to tell me whether or not he had any problems or side effects with haloperidol.   For insomnia: Continue Ativan 1 mg by mouth daily at bedtime  Cannabis use disorder, alcohol use disorder: Consider referral to intensive outpatient substance abuse treatment upon discharge.  For hypertension: We will increase Norvasc to his original dose of 10 mg a day  Diabetes I will start the patient on metformin 500 mg twice a day  For gout I will start him on colchicine and allopurinol as the patient appears to be having any acute attack. Patient is complaining of pain on  first toes. Also added indocid  For tobacco use disorder: continue  nicotine patch 21 mg  Diet low sodium and carbohydrate modified  Precautions every 15 minute checks  Vital signs daily  Hospitalization status involuntary commitment  Labs I will order hemoglobin A1c, lipid panel and TSH--- hemoglobin  A1c 6.5. TSH within the normal limits. Cholesterol shows decreased HDL.  Disposition patient will be discharged back to his apartment  Follow-up patient will continue to follow-up with [facility].--- Considering ACT referral   Jimmy Footman, MD 02/18/2017, 1:03 PM

## 2017-02-18 NOTE — Plan of Care (Signed)
Problem: Education: Goal: Verbalization of understanding the information provided will improve Outcome: Not Progressing Pt forwards little

## 2017-02-18 NOTE — Progress Notes (Signed)
Recreation Therapy Notes  Date: 04.04.18 Time: 9:30 am Location: Craft Room  Group Topic: Self-esteem  Goal Area(s) Addresses:  Patients will write at least one positive trait about themselves. Patients will verbalize benefit of having a healthy self-esteem.  Behavioral Response: Attentive, Interactive  Intervention: I Am  Activity: Patients were given a worksheet with the letter I on it and were instructed to write as many positive traits about themselves inside the letter.  Education: LRT educated patients on ways they can improve their self-esteem.  Education Outcome: In group clarification offered  Clinical Observations/Feedback: Patient thought of positive traits with assistance from LRT. LRT wrote the traits on patient's worksheet. Patient left group at approximately 9:53 am with Dr. Ardyth Harps and returned to group at approximately 9:57 am. Patient contributed to group discussion by stating what he can do to increase his self-esteem.  Jacquelynn Cree, LRT/CTRS 02/18/2017 10:16 AM

## 2017-02-18 NOTE — Progress Notes (Signed)
Recreation Therapy Notes  INPATIENT RECREATION THERAPY ASSESSMENT  Patient Details Name: Walter Pena. MRN: [id number] DOB: Nov 27, 1980 Today's Date: 02/18/2017  Patient Stressors:  Patient verbalized no stressors.  Coping Skills:   Isolate, Avoidance, Exercise, Music, Sports  Personal Challenges: Anger, Decision-Making, Self-Esteem/Confidence, Stress Management, Time Management, Trusting Others  Leisure Interests (2+):  Sports - Basketball, Sports - Football  Awareness of Community Resources:  Yes  Community Resources:  [employer]  Current Use: No  If no, Barriers?: Other (Comment) (No interest)  Patient Strengths:  "I don't know"  Patient Identified Areas of Improvement:  No  Current Recreation Participation:  "I don't know"  Patient Goal for Hospitalization:  To find a place  [place] of Residence:  [place] of Residence:  [place]   Current SI (including self-harm):  No  Current HI:  No  Consent to Intern Participation: N/A   Jacquelynn Cree, LRT/CTRS 02/18/2017, 3:49 PM

## 2017-02-18 NOTE — [facility] Group Notes (Signed)
[facility] Group Notes:  (Nursing/MHT/Case Management/Adjunct)  Date:  02/18/2017  Time:  5:32 PM  Type of Therapy:  Psychoeducational Skills  Participation Level:  Minimal  Participation Quality:  Appropriate  Affect:  Appropriate  Cognitive:  Appropriate  Insight:  Appropriate  Engagement in Group:  Poor  Modes of Intervention:  Socialization  Summary of Progress/Problems:  Lynelle Smoke Jeromy Borcherding 02/18/2017, 5:32 PM

## 2017-02-18 NOTE — Progress Notes (Signed)
Isolates to self. Stands at nurses station staring at staff. No interaction with peers. Minimal interaction with staff. Forwards little.  Encouragement and support offered. Safety checks maintained. Pt receptive and remains safe on unit with q 15 min checks.

## 2017-02-18 NOTE — Tx Team (Signed)
Interdisciplinary Treatment and Diagnostic Plan Update  02/18/2017 Time of Session: 1145 Walter Pena. MRN: [id number]  Principal Diagnosis: Schizophrenia (HCC)  Secondary Diagnoses: Principal Problem:   Schizophrenia (HCC) Active Problems:   HTN (hypertension)   Cannabis use disorder, moderate, dependence (HCC)   Tobacco use disorder   Gout   Hidradenitis   Noncompliance   Alcohol use disorder (HCC)   Diabetes (HCC)   Current Medications:  Current Facility-Administered Medications  Medication Dose Route Frequency Provider Last Rate Last Dose  . acetaminophen (TYLENOL) tablet 650 mg  650 mg Oral Q6H PRN Audery Amel, MD      . allopurinol (ZYLOPRIM) tablet 100 mg  100 mg Oral Daily Jimmy Footman, MD   100 mg at 02/18/17 0745  . alum & mag hydroxide-simeth (MAALOX/MYLANTA) 200-200-20 MG/5ML suspension 30 mL  30 mL Oral Q4H PRN Audery Amel, MD      . Melene Muller ON 02/19/2017] amLODipine (NORVASC) tablet 10 mg  10 mg Oral Daily Jimmy Footman, MD      . colchicine tablet 0.6 mg  0.6 mg Oral Daily Jimmy Footman, MD   0.6 mg at 02/18/17 0745  . indomethacin (INDOCIN) capsule 50 mg  50 mg Oral TID WC Jimmy Footman, MD   50 mg at 02/18/17 1143  . LORazepam (ATIVAN) tablet 1 mg  1 mg Oral QHS Jimmy Footman, MD   1 mg at 02/17/17 2104  . magnesium hydroxide (MILK OF MAGNESIA) suspension 30 mL  30 mL Oral Daily PRN Audery Amel, MD      . nicotine (NICODERM CQ - dosed in mg/24 hours) patch 21 mg  21 mg Transdermal Daily Jimmy Footman, MD   21 mg at 02/17/17 0900  . [START ON 02/19/2017] paliperidone (INVEGA) 24 hr tablet 12 mg  12 mg Oral Daily Jimmy Footman, MD       PTA Medications: Prescriptions Prior to Admission  Medication Sig Dispense Refill Last Dose  . allopurinol (ZYLOPRIM) 100 MG tablet Take 100 mg by mouth daily.     Marland Kitchen amLODipine (NORVASC) 10 MG tablet Take 10 mg by mouth daily.     .  benazepril (LOTENSIN) 40 MG tablet Take 1 tablet by mouth daily.     . benztropine (COGENTIN) 1 MG tablet Take 1 mg by mouth daily.   Taking  . colchicine 0.6 MG tablet 1 tablet by mouth daily until gout pain is gone. 15 tablet 0 Taking    Patient Stressors: Medication change or noncompliance Substance abuse  Patient Strengths: Capable of independent living Motivation for treatment/growth  Treatment Modalities: Medication Management, Group therapy, Case management,  1 to 1 session with clinician, Psychoeducation, Recreational therapy.   Physician Treatment Plan for Primary Diagnosis: Schizophrenia (HCC) Long Term Goal(s): Improvement in symptoms so as ready for discharge Improvement in symptoms so as ready for discharge   Short Term Goals: Compliance with prescribed medications will improve Ability to identify triggers associated with substance abuse/mental health issues will improve Ability to identify changes in lifestyle to reduce recurrence of condition will improve Ability to demonstrate self-control will improve  Medication Management: Evaluate patient's response, side effects, and tolerance of medication regimen.  Therapeutic Interventions: 1 to 1 sessions, Unit Group sessions and Medication administration.  Evaluation of Outcomes: Progressing  Physician Treatment Plan for Secondary Diagnosis: Principal Problem:   Schizophrenia (HCC) Active Problems:   HTN (hypertension)   Cannabis use disorder, moderate, dependence (HCC)   Tobacco use disorder   Gout  Hidradenitis   Noncompliance   Alcohol use disorder (HCC)   Diabetes (HCC)  Long Term Goal(s): Improvement in symptoms so as ready for discharge Improvement in symptoms so as ready for discharge   Short Term Goals: Compliance with prescribed medications will improve Ability to identify triggers associated with substance abuse/mental health issues will improve Ability to identify changes in lifestyle to reduce  recurrence of condition will improve Ability to demonstrate self-control will improve     Medication Management: Evaluate patient's response, side effects, and tolerance of medication regimen.  Therapeutic Interventions: 1 to 1 sessions, Unit Group sessions and Medication administration.  Evaluation of Outcomes: Progressing   RN Treatment Plan for Primary Diagnosis: Schizophrenia (HCC) Long Term Goal(s): Knowledge of disease and therapeutic regimen to maintain health will improve  Short Term Goals: Ability to verbalize feelings will improve and Compliance with prescribed medications will improve  Medication Management: RN will administer medications as ordered by provider, will assess and evaluate patient's response and provide education to patient for prescribed medication. RN will report any adverse and/or side effects to prescribing provider.  Therapeutic Interventions: 1 on 1 counseling sessions, Psychoeducation, Medication administration, Evaluate responses to treatment, Monitor vital signs and CBGs as ordered, Perform/monitor CIWA, COWS, AIMS and Fall Risk screenings as ordered, Perform wound care treatments as ordered.  Evaluation of Outcomes: Progressing   LCSW Treatment Plan for Primary Diagnosis: Schizophrenia (HCC) Long Term Goal(s): Safe transition to appropriate next level of care at discharge, Engage patient in therapeutic group addressing interpersonal concerns.  Short Term Goals: Engage patient in aftercare planning with referrals and resources and Increase skills for wellness and recovery  Therapeutic Interventions: Assess for all discharge needs, 1 to 1 time with Social worker, Explore available resources and support systems, Assess for adequacy in community support network, Educate family and significant other(s) on suicide prevention, Complete Psychosocial Assessment, Interpersonal group therapy.  Evaluation of Outcomes: Progressing    Recreational Therapy  Treatment Plan for Primary Diagnosis: Schizophrenia (HCC) Long Term Goal(s): Patient will participate in recreation therapy treatment in at least 2 group sessions without prompting from LRT  Short Term Goals: Increase self-esteem, Increase stress management skills  Treatment Modalities: Group Therapy and Individual Treatment Sessions  Therapeutic Interventions: Psychoeducation  Evaluation of Outcomes: Progressing   Progress in Treatment: Attending groups: Yes. Participating in groups: Yes. Taking medication as prescribed: Yes. Toleration medication: Yes. Family/Significant other contact made: No, will contact:  mother Patient understands diagnosis: Yes. Discussing patient identified problems/goals with staff: Yes. Medical problems stabilized or resolved: Yes. Denies suicidal/homicidal ideation: Yes. Issues/concerns per patient self-inventory: No. Other: none  New problem(s) identified: No, Describe:  none  New Short Term/Long Term Goal(s):Pt was unable to share any goals that he had.  Discharge Plan or Barriers: CSW assessing for appropriate plan.  Reason for Continuation of Hospitalization: Delusions  Medication stabilization  Estimated Length of Stay: 5 days  Attendees: Patient:Walter Pena 02/18/2017   Physician: Dr. Ardyth Harps, MD 02/18/2017   Nursing: Shelia Media, RN 02/18/2017   RN Care Manager: 02/18/2017   Social Worker: Daleen Squibb, LCSW 02/18/2017   Recreational Therapist: Princella Ion, LRT/CTRS  02/18/2017   Other:  02/18/2017  Other:  02/18/2017   Other: 02/18/2017        Scribe for Treatment Team: Lorri Frederick, LCSW 02/18/2017 2:37 PM

## 2017-02-18 NOTE — [facility] Group Notes (Signed)
[facility] LCSW Group Therapy  02/18/2017 2:05 PM  Type of Therapy:  Group Therapy  Participation Level:  Patient did not attend group. CSW invited patient to group.   Summary of Progress/Problems: Emotional Regulation: Patients will identify both negative and positive emotions. They will discuss emotions they have difficulty regulating and how they impact their lives. Patients will be asked to identify healthy coping skills to combat unhealthy reactions to negative emotions.   Prince Couey G. Garnette Czech MSW, LCSWA 02/18/2017, 2:05 PM

## 2017-02-19 MED ORDER — BENAZEPRIL HCL 10 MG PO TABS
10.0000 mg | ORAL_TABLET | Freq: Every day | ORAL | Status: DC
  Administered 2017-02-19 – 2017-02-20 (×2): 10 mg via ORAL
  Filled 2017-02-19 (×2): qty 1

## 2017-02-19 NOTE — Progress Notes (Addendum)
[facility] MD Progress Note  02/19/2017 4:03 PM Neta Ehlers.  MRN:  [id number] Subjective: Patient is a 36 year old single African-American male from [facility] who carries a diagnosis of schizophrenia.  Patient was brought in by police to our emergency department on March 31 after he was found wandering in his apartment complex barefoot and flashing his genitals.  Patient eloped from the emergency department. Later on he was brought back by police after he was placed on involuntary commitment.  Patient told the ER physician that he was at a football game. Per collateral information obtained from his mother: "Pt lives independently but has not paid his bills and now his lights are turned off and he has received eviction notice. Pt was found walking in the middle of the street today and when he starts to decompensate he begins to walk all over the place and also starts to get angry a lot. This is how pt typically acts when he is not taking his medicine and this has happened a number of times before. Pt history includes several group homes and several admits to [facility]. Mother has also smelled marijuana in pt's apartment recently and believes he is smoking again."  02/19/2017 Less thought blocking. Pt still does not appear to have insight as to why he is here. Say he does not feel the medications have kicked in yet. Finally slept for 5 hours last night which is the most he has slept in several days. complains of poor appetite. Denies suicidality, homicidality or hallucinations. Denies having side effects from medications. As far as physical complains, says pains from gout is less severe.  Per nursing: Received alert and calm on unit. No complaints.  Denies SI.HI.AVH expresses some paranoia towards medications but is compliant with care. Will continue to monitor and provide interventions as needed.   D: Pt denies SI/HI/AVH. Pt is pleasant and cooperative. Affect is flat and sad. Patient  noted pacing the halls and staring inappropriately at nurses in the nursing station. Patient appears less anxious and he is interacting with peersf appropriately.  A: Pt was offered support and encouragement. Pt was given scheduled medications. Pt was encouraged to attend groups. Q 15 minute checks were done for safety.  R:Pt attends groups and interacts well with peers and staff. Pt is taking medication. Pt has no complaints.Pt receptive to treatment and safety maintained on unit.  Principal Problem: Schizophrenia (HCC) Diagnosis:   Patient Active Problem List   Diagnosis Date Noted  . Diabetes (HCC) [E11.9] 02/18/2017  . Schizophrenia (HCC) [F20.9] 02/17/2017  . Alcohol use disorder (HCC) [F10.99] 02/17/2017  . Noncompliance [Z91.19] 05/07/2016  . HTN (hypertension) [I10] 01/21/2016  . Cannabis use disorder, moderate, dependence (HCC) [F12.20] 01/21/2016  . Tobacco use disorder [F17.200] 01/21/2016  . Gout [M10.9] 01/21/2016  . Hidradenitis [L73.2] 01/21/2016   Total Time spent with patient: 30 minutes  Past Psychiatric History: Patient reports being diagnosed with schizophrenia in 1995. States that he has been hospitalized multiple times since the age of 59. He denies any history of self injury or suicidal attempts.  Past Medical History:  Past Medical History:  Diagnosis Date  . Gout   . Hidradenitis   . Hypertension   . Schizophrenia [facility])     Past Surgical History:  Procedure Laterality Date  . sweat gland removal     Family History:  Family History  Problem Relation Age of Onset  . Diabetes Mellitus II Mother   . Hypertension Mother  Family Psychiatric  History: Patient denies having any family history of suicide, mental illness or substance abuse.  Social History: Patient lives by himself in an apartment. He is disabled due to mental illness. He is single, never married. Doesn't have any children. He denies having any legal history. As far as his education he  dropped out of the 11th grade History  Alcohol Use  . Yes     History  Drug Use  . Types: Marijuana    Social History   Social History  . Marital status: Single    Spouse name: N/A  . Number of children: N/A  . Years of education: N/A   Social History Main Topics  . Smoking status: Current Every Day Smoker    Packs/day: 1.00    Years: 9.00    Types: Cigarettes  . Smokeless tobacco: Never Used  . Alcohol use Yes  . Drug use: Yes    Types: Marijuana  . Sexual activity: Not Asked   Other Topics Concern  . None   Social History Narrative  . None     Current Medications: Current Facility-Administered Medications  Medication Dose Route Frequency Provider Last Rate Last Dose  . acetaminophen (TYLENOL) tablet 650 mg  650 mg Oral Q6H PRN Audery Amel, MD      . allopurinol (ZYLOPRIM) tablet 100 mg  100 mg Oral Daily Jimmy Footman, MD   100 mg at 02/19/17 0815  . alum & mag hydroxide-simeth (MAALOX/MYLANTA) 200-200-20 MG/5ML suspension 30 mL  30 mL Oral Q4H PRN Audery Amel, MD      . amLODipine (NORVASC) tablet 10 mg  10 mg Oral Daily Jimmy Footman, MD   10 mg at 02/19/17 0814  . colchicine tablet 0.6 mg  0.6 mg Oral Daily Jimmy Footman, MD   0.6 mg at 02/19/17 0813  . indomethacin (INDOCIN) capsule 50 mg  50 mg Oral TID WC Jimmy Footman, MD   50 mg at 02/19/17 1224  . LORazepam (ATIVAN) tablet 1 mg  1 mg Oral QHS Jimmy Footman, MD   1 mg at 02/18/17 2223  . magnesium hydroxide (MILK OF MAGNESIA) suspension 30 mL  30 mL Oral Daily PRN Audery Amel, MD      . nicotine (NICODERM CQ - dosed in mg/24 hours) patch 21 mg  21 mg Transdermal Daily Jimmy Footman, MD   21 mg at 02/17/17 0900  . paliperidone (INVEGA) 24 hr tablet 12 mg  12 mg Oral Daily Jimmy Footman, MD   12 mg at 02/19/17 0815    Lab Results:  No results found for this or any previous visit (from the past 48 hour(s)).  Blood  Alcohol level:  Lab Results  Component Value Date   [facility] <5 02/14/2017   ETH <5 05/07/2016    Metabolic Disorder Labs: Lab Results  Component Value Date   HGBA1C 6.5 (H) 02/17/2017   MPG 140 02/17/2017   Lab Results  Component Value Date   PROLACTIN 27.2 (H) 05/08/2016   PROLACTIN 6.9 01/19/2016   Lab Results  Component Value Date   CHOL 118 02/17/2017   TRIG 117 02/17/2017   HDL 25 (L) 02/17/2017   CHOLHDL 4.7 02/17/2017   VLDL 23 02/17/2017   LDLCALC 70 02/17/2017   LDLCALC 68 05/08/2016    Physical Findings: AIMS:  , ,  ,  ,    CIWA:    COWS:     Musculoskeletal: Strength & Muscle Tone: within normal limits Gait &  Station: normal Patient leans: N/A  Psychiatric Specialty Exam: Physical Exam  Constitutional: He is oriented to person, place, and time. He appears well-developed and well-nourished.  HENT:  Head: Normocephalic and atraumatic.  Eyes: Conjunctivae and EOM are normal.  Neck: Normal range of motion.  Respiratory: Effort normal.  Musculoskeletal: Normal range of motion.  Neurological: He is alert and oriented to person, place, and time.    Review of Systems  Constitutional: Negative.   HENT: Negative.   Eyes: Negative.   Respiratory: Negative.   Cardiovascular: Negative.   Gastrointestinal: Negative.   Genitourinary: Negative.   Musculoskeletal: Negative.   Skin: Negative.   Neurological: Negative.   Endo/Heme/Allergies: Negative.   Psychiatric/Behavioral: Positive for substance abuse. Negative for depression, hallucinations, memory loss and suicidal ideas. The patient is not nervous/anxious and does not have insomnia.     Blood pressure (!) 140/91, pulse 68, temperature 98.3 F (36.8 C), temperature source Oral, resp. rate 16, height 5\' 6"  (1.676 m), weight 96.2 kg (212 lb), SpO2 100 %.Body mass index is 34.22 kg/m.  General Appearance: Fairly Groomed  Eye Contact:  Minimal  Speech:  Slow  Volume:  Decreased  Mood:  Dysphoric  Affect:   Blunt  Thought Process:  Disorganized less  Orientation:  Full (Time, Place, and Person)  Thought Content:  Illogical less  Suicidal Thoughts:  No  Homicidal Thoughts:  No  Memory:  Immediate;   Poor Recent;   Poor Remote;   Poor  Judgement:  Impaired  Insight:  Lacking  Psychomotor Activity:  Decreased  Concentration:  Concentration: Poor and Attention Span: Poor  Recall:  Poor  Fund of Knowledge:  Poor  Language:  Fair  Akathisia:  No  Handed:    AIMS (if indicated):     Assets:  Manufacturing systems engineer Social Support  ADL's:  Intact  Cognition:  WNL  Sleep:  Number of Hours: 2     Treatment Plan Summary:  Patient is a 36 year old African-American male with schizophrenia. He has a long history of poor compliance with medication and substance abuse.  Patient presented to our emergency department due to disorganized and bizarre behavior likely in the setting of noncompliance.  Schizophrenia: The patient has been started on Invega. Continue Invega12 mg qhs. He is in need of a long acting injectable. Looks like back in June of last year he was treated with Haldol decanoate. Patient is unable to tell me whether or not he had any problems or side effects with haloperidol.   For insomnia: Continue Ativan 1 mg by mouth daily at bedtime  Cannabis use disorder, alcohol use disorder: Consider referral to intensive outpatient substance abuse treatment upon discharge.  For hypertension:continue Norvasc 10 mg daily and restart Lotensin 10 mg daily  Diabetes continue metformin 500 mg twice a day  For gout continue colchicine, allopurinol and indocin   For tobacco use disorder: continue  nicotine patch 21 mg  Diet low sodium and carbohydrate modified  Precautions every 15 minute checks  Vital signs daily  Hospitalization status involuntary commitment  Labs I will order hemoglobin A1c, lipid panel and TSH--- hemoglobin A1c 6.5. TSH within the normal limits.  Cholesterol shows decreased HDL.  Disposition patient will be discharged back to his apartment  Follow-up patient will continue to follow-up with [facility].--- Considering ACT referral   Jimmy Footman, MD 02/19/2017, 4:03 PM

## 2017-02-19 NOTE — [facility] Group Notes (Signed)
[facility] Group Notes:  (Nursing/MHT/Case Management/Adjunct)  Date:  02/19/2017  Time:  3:36 AM  Type of Therapy:  Group Therapy  Participation Level:  Active  Participation Quality:  Appropriate  Affect:  Appropriate  Cognitive:  Appropriate  Insight:  Appropriate  Engagement in Group:  Engaged  Modes of Intervention:  n/a  Summary of Progress/Problems:  Walter Pena 02/19/2017, 3:36 AM

## 2017-02-19 NOTE — Progress Notes (Signed)
Received alert and calm on unit. No complaints.  Denies SI.HI.AVH expresses some paranoia towards medications but is compliant with care. Will continue to monitor and provide interventions as needed.

## 2017-02-19 NOTE — [facility] Group Notes (Signed)
[facility] LCSW Group Therapy Note  Type of Therapy and Topic:  Group Therapy:  Goals Group: SMART Goals  Participation Level:  Patient did not attend group. CSW invited patient to group.   Description of Group:   The purpose of a daily goals group is to assist and guide patients in setting recovery/wellness-related goals.  The objective is to set goals as they relate to the crisis in which they were admitted. Patients will be using SMART goal modalities to set measurable goals.  Characteristics of realistic goals will be discussed and patients will be assisted in setting and processing how one will reach their goal. Facilitator will also assist patients in applying interventions and coping skills learned in psycho-education groups to the SMART goal and process how one will achieve defined goal.  Therapeutic Goals: -Patients will develop and document one goal related to or their crisis in which brought them into treatment. -Patients will be guided by LCSW using SMART goal setting modality in how to set a measurable, attainable, realistic and time sensitive goal.  -Patients will process barriers in reaching goal. -Patients will process interventions in how to overcome and successful in reaching goal.   Summary of Patient Progress:  Patient Goal: Patient did not attend group. CSW invited patient to group.    Therapeutic Modalities:   Motivational Interviewing Engineer, manufacturing systems Therapy Crisis Intervention Model SMART goals setting  Jamise Pentland G. Garnette Czech MSW, [facility] 02/19/2017 10:44 AM

## 2017-02-19 NOTE — Progress Notes (Signed)
D: Pt denies SI/HI/AVH. Pt is pleasant and cooperative. Affect is flat and sad. Patient noted pacing the halls and staring inappropriately at nurses in the nursing station. Patient appears less anxious and he is interacting with peersf appropriately.  A: Pt was offered support and encouragement. Pt was given scheduled medications. Pt was encouraged to attend groups. Q 15 minute checks were done for safety.  R:Pt attends groups and interacts well with peers and staff. Pt is taking medication. Pt has no complaints.Pt receptive to treatment and safety maintained on unit.

## 2017-02-19 NOTE — Progress Notes (Signed)
CSW spoke to [facility] ACT team about pt and provider her with information.  She will review for potential admission to ACT team program. Garner Nash, MSW, LCSW Clinical Social Worker 02/19/2017 1:36 PM

## 2017-02-19 NOTE — Plan of Care (Signed)
Problem: Education: Goal: Knowledge of the prescribed therapeutic regimen will improve Outcome: Not Progressing Paranoia expressed towards medications.

## 2017-02-19 NOTE — [facility] Group Notes (Signed)
[facility] LCSW Group Therapy  02/19/2017 10:44 AM  Type of Therapy:  Group Therapy  Participation Level:  Minimal  Participation Quality:  Attentive  Affect:  Appropriate  Cognitive:  Appropriate  Insight:  Limited  Engagement in Therapy:  Improving  Modes of Intervention:  Activity, Discussion, Education, Problem-solving, Reality Testing, Socialization and Support  Summary of Progress/Problems: Balance in life: Patients will discuss the concept of balance and how it looks and feels to be unbalanced. Pt will identify areas in their life that is unbalanced and ways to become more balanced. They discussed what aspects in their lives has influenced their self care. Patients also discussed self care in the areas of self regulation/control, hygiene/appearance, sleep/relaxation, healthy leisure, healthy eating habits, exercise, inner peace/spirituality, self improvement, sobriety, and health management. They were challenged to identify changes that are needed in order to improve self care. Patient stated he would like to exercise as a coping skill and to improve his overall health. CSW provided support to patient and discussed ways to incorporate exercise into his weekly schedule.   Areyanna Figeroa G. Garnette Czech MSW, LCSWA 02/19/2017, 10:50 AM

## 2017-02-20 MED ORDER — LORAZEPAM 2 MG PO TABS
2.0000 mg | ORAL_TABLET | Freq: Every day | ORAL | Status: DC
  Administered 2017-02-20 – 2017-02-24 (×5): 2 mg via ORAL
  Filled 2017-02-20 (×4): qty 1

## 2017-02-20 MED ORDER — DIPHENHYDRAMINE HCL 25 MG PO CAPS
25.0000 mg | ORAL_CAPSULE | Freq: Every day | ORAL | Status: DC
  Administered 2017-02-20 – 2017-02-24 (×5): 25 mg via ORAL
  Filled 2017-02-20 (×5): qty 1

## 2017-02-20 MED ORDER — PALIPERIDONE ER 3 MG PO TB24
12.0000 mg | ORAL_TABLET | Freq: Every day | ORAL | Status: DC
  Administered 2017-02-21 – 2017-02-25 (×5): 12 mg via ORAL
  Filled 2017-02-20 (×5): qty 4

## 2017-02-20 MED ORDER — INDOMETHACIN 25 MG PO CAPS
25.0000 mg | ORAL_CAPSULE | Freq: Three times a day (TID) | ORAL | Status: DC
  Administered 2017-02-20 – 2017-02-23 (×10): 25 mg via ORAL
  Filled 2017-02-20 (×9): qty 1

## 2017-02-20 MED ORDER — BENAZEPRIL HCL 10 MG PO TABS
20.0000 mg | ORAL_TABLET | Freq: Every day | ORAL | Status: DC
  Administered 2017-02-21 – 2017-02-23 (×3): 20 mg via ORAL
  Filled 2017-02-20 (×3): qty 2

## 2017-02-20 MED ORDER — BENAZEPRIL HCL 10 MG PO TABS
10.0000 mg | ORAL_TABLET | Freq: Once | ORAL | Status: AC
  Administered 2017-02-20: 10 mg via ORAL
  Filled 2017-02-20: qty 1

## 2017-02-20 MED ORDER — LORAZEPAM 1 MG PO TABS
ORAL_TABLET | ORAL | Status: AC
Start: 2017-02-20 — End: 2017-02-20
  Filled 2017-02-20: qty 2

## 2017-02-20 NOTE — Progress Notes (Signed)
Denies SI/HI/AVH.  Affect sad.  Minimal interaction with peers and staff. Medication and group compliant.  Support and encouragement offered.  Safety maintained.

## 2017-02-20 NOTE — [facility] Group Notes (Signed)
[facility] LCSW Group Therapy   02/20/2017 1 PM   Type of Therapy: Group Therapy   Participation Level: Minimal   Participation Quality: Attentive   Affect: Appropriate   Cognitive: Alert and Oriented   Insight: Developing/Improving and Engaged   Engagement in Therapy: Developing/Improving and Engaged   Modes of Intervention: Clarification, Confrontation, Discussion, Education, Exploration, Limit-setting, Orientation, Problem-solving, Rapport Building, Dance movement psychotherapist, Socialization and Support   Summary of Progress/Problems: The topic for today was feelings about relapse. Pt discussed what relapse prevention is to them and identified triggers that they are on the path to relapse. Pt processed their feeling towards relapse and was able to relate to peers. Pt discussed coping skills that can be used for relapse prevention.    Hampton Abbot, MSW, LCSWA  02/20/2017, 2:18 PM

## 2017-02-20 NOTE — Progress Notes (Signed)
[facility] MD Progress Note  02/20/2017 10:19 AM Neta Ehlers.  MRN:  [id number] Subjective: Patient is a 36 year old single African-American male from [facility] who carries a diagnosis of schizophrenia.  Patient was brought in by police to our emergency department on March 31 after he was found wandering in his apartment complex barefoot and flashing his genitals.  Patient eloped from the emergency department. Later on he was brought back by police after he was placed on involuntary commitment.  Patient told the ER physician that he was at a football game. Per collateral information obtained from his mother: "Pt lives independently but has not paid his bills and now his lights are turned off and he has received eviction notice. Pt was found walking in the middle of the street today and when he starts to decompensate he begins to walk all over the place and also starts to get angry a lot. This is how pt typically acts when he is not taking his medicine and this has happened a number of times before. Pt history includes several group homes and several admits to [facility]. Mother has also smelled marijuana in pt's apartment recently and believes he is smoking again."  02/19/2017 Less thought blocking. Pt still does not appear to have insight as to why he is here. Say he does not feel the medications have kicked in yet. Finally slept for 5 hours last night which is the most he has slept in several days. complains of poor appetite. Denies suicidality, homicidality or hallucinations. Denies having side effects from medications. As far as physical complains, says pains from gout is less severe.  02/20/17 patient tells me he wasn't able to sleep at all last night. He kept falling asleep during assessment and had great difficulty following my questions and answering them. He continues to deny having any psychiatric symptoms. He seen pacing around and is staring at people. Nurses report he was  paranoid about medications yesterday.  Per nursing: D: Pt denies SI/HI/AVH. Pt is pleasant and cooperative. Pt  appears less anxious, paced less this afternoon, his thoughts were organized, no bizarre behavior noted, and he is interacting with peers and staff appropriately.  A: Pt was offered support and encouragement. Pt was given scheduled medications. Pt was encouraged to attend groups. Q 15 minute checks were done for safety.  R:Pt attends groups and interacts well with peers and staff. Pt is compliant with medication. .Pt receptive to treatment and safety maintained on unit.   Principal Problem: Schizophrenia (HCC) Diagnosis:   Patient Active Problem List   Diagnosis Date Noted  . Diabetes (HCC) [E11.9] 02/18/2017  . Schizophrenia (HCC) [F20.9] 02/17/2017  . Alcohol use disorder (HCC) [F10.99] 02/17/2017  . Noncompliance [Z91.19] 05/07/2016  . HTN (hypertension) [I10] 01/21/2016  . Cannabis use disorder, moderate, dependence (HCC) [F12.20] 01/21/2016  . Tobacco use disorder [F17.200] 01/21/2016  . Gout [M10.9] 01/21/2016  . Hidradenitis [L73.2] 01/21/2016   Total Time spent with patient: 30 minutes  Past Psychiatric History: Patient reports being diagnosed with schizophrenia in 1995. States that he has been hospitalized multiple times since the age of 75. He denies any history of self injury or suicidal attempts.  Past Medical History:  Past Medical History:  Diagnosis Date  . Gout   . Hidradenitis   . Hypertension   . Schizophrenia [facility])     Past Surgical History:  Procedure Laterality Date  . sweat gland removal     Family History:  Family History  Problem Relation Age of Onset  . Diabetes Mellitus II Mother   . Hypertension Mother    Family Psychiatric  History: Patient denies having any family history of suicide, mental illness or substance abuse.  Social History: Patient lives by himself in an apartment. He is disabled due to mental illness. He is single,  never married. Doesn't have any children. He denies having any legal history. As far as his education he dropped out of the 11th grade History  Alcohol Use  . Yes     History  Drug Use  . Types: Marijuana    Social History   Social History  . Marital status: Single    Spouse name: N/A  . Number of children: N/A  . Years of education: N/A   Social History Main Topics  . Smoking status: Current Every Day Smoker    Packs/day: 1.00    Years: 9.00    Types: Cigarettes  . Smokeless tobacco: Never Used  . Alcohol use Yes  . Drug use: Yes    Types: Marijuana  . Sexual activity: Not Asked   Other Topics Concern  . None   Social History Narrative  . None     Current Medications: Current Facility-Administered Medications  Medication Dose Route Frequency Provider Last Rate Last Dose  . acetaminophen (TYLENOL) tablet 650 mg  650 mg Oral Q6H PRN Audery Amel, MD      . allopurinol (ZYLOPRIM) tablet 100 mg  100 mg Oral Daily Jimmy Footman, MD   100 mg at 02/20/17 0847  . alum & mag hydroxide-simeth (MAALOX/MYLANTA) 200-200-20 MG/5ML suspension 30 mL  30 mL Oral Q4H PRN Audery Amel, MD      . amLODipine (NORVASC) tablet 10 mg  10 mg Oral Daily Jimmy Footman, MD   5 mg at 02/20/17 0847  . benazepril (LOTENSIN) tablet 10 mg  10 mg Oral Once Jimmy Footman, MD      . Melene Muller ON 02/21/2017] benazepril (LOTENSIN) tablet 20 mg  20 mg Oral Daily Jimmy Footman, MD      . indomethacin (INDOCIN) capsule 25 mg  25 mg Oral TID WC Jimmy Footman, MD      . LORazepam (ATIVAN) tablet 2 mg  2 mg Oral QHS Jimmy Footman, MD      . magnesium hydroxide (MILK OF MAGNESIA) suspension 30 mL  30 mL Oral Daily PRN Audery Amel, MD      . nicotine (NICODERM CQ - dosed in mg/24 hours) patch 21 mg  21 mg Transdermal Daily Jimmy Footman, MD   21 mg at 02/17/17 0900  . [START ON 02/21/2017] paliperidone (INVEGA) 24 hr tablet 12 mg   12 mg Oral QHS Jimmy Footman, MD        Lab Results:  No results found for this or any previous visit (from the past 48 hour(s)).  Blood Alcohol level:  Lab Results  Component Value Date   ETH <5 02/14/2017   ETH <5 05/07/2016    Metabolic Disorder Labs: Lab Results  Component Value Date   HGBA1C 6.5 (H) 02/17/2017   MPG 140 02/17/2017   Lab Results  Component Value Date   PROLACTIN 27.2 (H) 05/08/2016   PROLACTIN 6.9 01/19/2016   Lab Results  Component Value Date   CHOL 118 02/17/2017   TRIG 117 02/17/2017   HDL 25 (L) 02/17/2017   CHOLHDL 4.7 02/17/2017   VLDL 23 02/17/2017   LDLCALC 70 02/17/2017   LDLCALC 68 05/08/2016  Physical Findings: AIMS:  , ,  ,  ,    CIWA:    COWS:     Musculoskeletal: Strength & Muscle Tone: within normal limits Gait & Station: normal Patient leans: N/A  Psychiatric Specialty Exam: Physical Exam  Constitutional: He is oriented to person, place, and time. He appears well-developed and well-nourished.  HENT:  Head: Normocephalic and atraumatic.  Eyes: Conjunctivae and EOM are normal.  Neck: Normal range of motion.  Respiratory: Effort normal.  Musculoskeletal: Normal range of motion.  Neurological: He is alert and oriented to person, place, and time.    Review of Systems  Constitutional: Negative.   HENT: Negative.   Eyes: Negative.   Respiratory: Negative.   Cardiovascular: Negative.   Gastrointestinal: Negative.   Genitourinary: Negative.   Musculoskeletal: Negative.   Skin: Negative.   Neurological: Negative.   Endo/Heme/Allergies: Negative.   Psychiatric/Behavioral: Positive for substance abuse. Negative for depression, hallucinations, memory loss and suicidal ideas. The patient is not nervous/anxious and does not have insomnia.     Blood pressure (!) 151/85, pulse 64, temperature 97.8 F (36.6 C), temperature source Oral, resp. rate 20, height 5\' 6"  (1.676 m), weight 96.2 kg (212 lb), SpO2 100  %.Body mass index is 34.22 kg/m.  General Appearance: Fairly Groomed  Eye Contact:  Minimal  Speech:  Slow  Volume:  Decreased  Mood:  Dysphoric  Affect:  Blunt  Thought Process:  Disorganized   Orientation:  Full (Time, Place, and Person)  Thought Content:  Illogical   Suicidal Thoughts:  No  Homicidal Thoughts:  No  Memory:  Immediate;   Poor Recent;   Poor Remote;   Poor  Judgement:  Impaired  Insight:  Lacking  Psychomotor Activity:  Decreased  Concentration:  Concentration: Poor and Attention Span: Poor  Recall:  Poor  Fund of Knowledge:  Poor  Language:  Fair  Akathisia:  No  Handed:    AIMS (if indicated):     Assets:  Manufacturing systems engineer Social Support  ADL's:  Intact  Cognition:  WNL  Sleep:  Number of Hours: 8.5     Treatment Plan Summary:  Patient is a 36 year old African-American male with schizophrenia. He has a long history of poor compliance with medication and substance abuse.  Patient presented to our emergency department due to disorganized and bizarre behavior likely in the setting of noncompliance.  Schizophrenia: The patient has been started on Invega. Continue Invega12 mg qhs. He is in need of a long acting injectable. Looks like back in June of last year he was treated with Haldol decanoate. Patient is unable to tell me whether or not he had any problems or side effects with haloperidol.   Patient does not appear to be open to long acting injectable at this time. We will try to readdress this on Monday.  For insomnia: Continue Ativan but  will increase the dose to 2 mg  at bedtime  Cannabis use disorder, alcohol use disorder: Consider referral to intensive outpatient substance abuse treatment upon discharge.  For hypertension:continue Norvasc 10 mg daily and restart Lotensin. As blood pressure was elevated today I will increase Lotensin from 10 to 20 mg. If needed over the weekend and also Lotensin could be titrated up to 40 mg which was  his home dose.  Diabetes continue metformin 500 mg twice a day  For gout continue colchicine, allopurinol and indocin   For tobacco use disorder: continue  nicotine patch 21 mg  Diet low sodium and carbohydrate  modified  Precautions every 15 minute checks  Vital signs daily  Hospitalization status involuntary commitment  Labs I will order hemoglobin A1c, lipid panel and TSH--- hemoglobin A1c 6.5. TSH within the normal limits. Cholesterol shows decreased HDL.  Disposition patient will be discharged back to his apartment  Follow-up patient will continue to follow-up with [facility].--- Considering ACT referral   Jimmy Footman, MD 02/20/2017, 10:19 AM

## 2017-02-20 NOTE — Tx Team (Signed)
Interdisciplinary Treatment and Diagnostic Plan Update  02/20/2017 Time of Session: 11:00am Walter Pena. MRN: [id number]  Principal Diagnosis: Schizophrenia (HCC)  Secondary Diagnoses: Principal Problem:   Schizophrenia (HCC) Active Problems:   HTN (hypertension)   Cannabis use disorder, moderate, dependence (HCC)   Tobacco use disorder   Gout   Hidradenitis   Noncompliance   Alcohol use disorder (HCC)   Diabetes (HCC)   Current Medications:  Current Facility-Administered Medications  Medication Dose Route Frequency Provider Last Rate Last Dose  . acetaminophen (TYLENOL) tablet 650 mg  650 mg Oral Q6H PRN Audery Amel, MD      . allopurinol (ZYLOPRIM) tablet 100 mg  100 mg Oral Daily Jimmy Footman, MD   100 mg at 02/20/17 0847  . alum & mag hydroxide-simeth (MAALOX/MYLANTA) 200-200-20 MG/5ML suspension 30 mL  30 mL Oral Q4H PRN Audery Amel, MD      . amLODipine (NORVASC) tablet 10 mg  10 mg Oral Daily Jimmy Footman, MD   5 mg at 02/20/17 0847  . [START ON 02/21/2017] benazepril (LOTENSIN) tablet 20 mg  20 mg Oral Daily Jimmy Footman, MD      . diphenhydrAMINE (BENADRYL) capsule 25 mg  25 mg Oral QHS Jimmy Footman, MD      . indomethacin (INDOCIN) capsule 25 mg  25 mg Oral TID WC Jimmy Footman, MD   25 mg at 02/20/17 1225  . LORazepam (ATIVAN) tablet 2 mg  2 mg Oral QHS Jimmy Footman, MD      . magnesium hydroxide (MILK OF MAGNESIA) suspension 30 mL  30 mL Oral Daily PRN Audery Amel, MD      . nicotine (NICODERM CQ - dosed in mg/24 hours) patch 21 mg  21 mg Transdermal Daily Jimmy Footman, MD   21 mg at 02/17/17 0900  . [START ON 02/21/2017] paliperidone (INVEGA) 24 hr tablet 12 mg  12 mg Oral QHS Jimmy Footman, MD       PTA Medications: Prescriptions Prior to Admission  Medication Sig Dispense Refill Last Dose  . allopurinol (ZYLOPRIM) 100 MG tablet Take 100 mg by mouth  daily.     Marland Kitchen amLODipine (NORVASC) 10 MG tablet Take 10 mg by mouth daily.     . benazepril (LOTENSIN) 40 MG tablet Take 1 tablet by mouth daily.     . benztropine (COGENTIN) 1 MG tablet Take 1 mg by mouth daily.   Taking  . colchicine 0.6 MG tablet 1 tablet by mouth daily until gout pain is gone. 15 tablet 0 Taking    Patient Stressors: Medication change or noncompliance Substance abuse  Patient Strengths: Capable of independent living Motivation for treatment/growth  Treatment Modalities: Medication Management, Group therapy, Case management,  1 to 1 session with clinician, Psychoeducation, Recreational therapy.   Physician Treatment Plan for Primary Diagnosis: Schizophrenia (HCC) Long Term Goal(s): Improvement in symptoms so as ready for discharge Improvement in symptoms so as ready for discharge   Short Term Goals: Compliance with prescribed medications will improve Ability to identify triggers associated with substance abuse/mental health issues will improve Ability to identify changes in lifestyle to reduce recurrence of condition will improve Ability to demonstrate self-control will improve  Medication Management: Evaluate patient's response, side effects, and tolerance of medication regimen.  Therapeutic Interventions: 1 to 1 sessions, Unit Group sessions and Medication administration.  Evaluation of Outcomes: Progressing  Physician Treatment Plan for Secondary Diagnosis: Principal Problem:   Schizophrenia (HCC) Active Problems:   HTN (hypertension)  Cannabis use disorder, moderate, dependence (HCC)   Tobacco use disorder   Gout   Hidradenitis   Noncompliance   Alcohol use disorder (HCC)   Diabetes (HCC)  Long Term Goal(s): Improvement in symptoms so as ready for discharge Improvement in symptoms so as ready for discharge   Short Term Goals: Compliance with prescribed medications will improve Ability to identify triggers associated with substance abuse/mental  health issues will improve Ability to identify changes in lifestyle to reduce recurrence of condition will improve Ability to demonstrate self-control will improve     Medication Management: Evaluate patient's response, side effects, and tolerance of medication regimen.  Therapeutic Interventions: 1 to 1 sessions, Unit Group sessions and Medication administration.  Evaluation of Outcomes: Progressing   RN Treatment Plan for Primary Diagnosis: Schizophrenia (HCC) Long Term Goal(s): Knowledge of disease and therapeutic regimen to maintain health will improve  Short Term Goals: Ability to verbalize feelings will improve and Compliance with prescribed medications will improve  Medication Management: RN will administer medications as ordered by provider, will assess and evaluate patient's response and provide education to patient for prescribed medication. RN will report any adverse and/or side effects to prescribing provider.  Therapeutic Interventions: 1 on 1 counseling sessions, Psychoeducation, Medication administration, Evaluate responses to treatment, Monitor vital signs and CBGs as ordered, Perform/monitor CIWA, COWS, AIMS and Fall Risk screenings as ordered, Perform wound care treatments as ordered.  Evaluation of Outcomes: Progressing   LCSW Treatment Plan for Primary Diagnosis: Schizophrenia (HCC) Long Term Goal(s): Safe transition to appropriate next level of care at discharge, Engage patient in therapeutic group addressing interpersonal concerns.  Short Term Goals: Engage patient in aftercare planning with referrals and resources and Increase skills for wellness and recovery  Therapeutic Interventions: Assess for all discharge needs, 1 to 1 time with Social worker, Explore available resources and support systems, Assess for adequacy in community support network, Educate family and significant other(s) on suicide prevention, Complete Psychosocial Assessment, Interpersonal group  therapy.  Evaluation of Outcomes: Progressing    Recreational Therapy Treatment Plan for Primary Diagnosis: Schizophrenia (HCC) Long Term Goal(s): Patient will participate in recreation therapy treatment in at least 2 group sessions without prompting from LRT  Short Term Goals: Increase self-esteem, Increase stress management skills  Treatment Modalities: Group Therapy and Individual Treatment Sessions  Therapeutic Interventions: Psychoeducation  Evaluation of Outcomes: Progressing   Progress in Treatment: Attending groups: Yes. Participating in groups: Yes. Taking medication as prescribed: Yes. Toleration medication: Yes. Family/Significant other contact made: No, will contact:  family member Patient understands diagnosis: Yes. Discussing patient identified problems/goals with staff: Yes. Medical problems stabilized or resolved: Yes. Denies suicidal/homicidal ideation: Yes. Issues/concerns per patient self-inventory: No. Other: n/a  New problem(s) identified: None identified at this time.   New Short Term/Long Term Goal(s): None identified at this time.   Discharge Plan or Barriers: CSW still assessing appropriate discharge plan.   Reason for Continuation of Hospitalization: Medication stabilization Other; describe paranoia  Estimated Length of Stay: 7 days.   Attendees: Patient: 02/20/2017 2:33 PM  Physician: Dr. Radene JourneyJayme Cloud, MD 02/20/2017 2:33 PM  Nursing: Leonia Reader, RN 02/20/2017 2:33 PM  RN Care Manager: 02/20/2017 2:33 PM  Social Worker: Fredrich Birks. Garnette Czech MSW, LCSWA 02/20/2017 2:33 PM  Recreational Therapist: Jacquelynn Cree, LRT/CTRS 02/20/2017 2:33 PM  Other:  02/20/2017 2:33 PM  Other:  02/20/2017 2:33 PM  Other: 02/20/2017 2:33 PM    Scribe for Treatment Team: Arelia Longest, LCSWA 02/20/2017 2:37 PM

## 2017-02-20 NOTE — Progress Notes (Signed)
D: Pt denies SI/HI/AVH. Pt is pleasant and cooperative. Pt  appears less anxious, paced less this afternoon, his thoughts were organized, no bizarre behavior noted, and he is interacting with peers and staff appropriately.  A: Pt was offered support and encouragement. Pt was given scheduled medications. Pt was encouraged to attend groups. Q 15 minute checks were done for safety.  R:Pt attends groups and interacts well with peers and staff. Pt is compliant with medication. .Pt receptive to treatment and safety maintained on unit.

## 2017-02-20 NOTE — Progress Notes (Signed)
Recreation Therapy Notes  Date: 04.06.18 Time: 9:30 am Location: Craft Room  Group Topic: Coping Skills  Goal Area(s) Addresses:  Patient will participate in healthy coping skills. Patient will verbalize benefit of using art as a coping skill.  Behavioral Response: Did not attend  Intervention: Coloring  Activity: Patients were given coloring sheets to color and were instructed to think about the emotions they were feeling and what their minds were focused on.  Education: LRT educated patients on healthy coping skills.   Education Outcome: Patient did not attend group.  Clinical Observations/Feedback: Patient did not attend group.  Sheelah Ritacco M, LRT/CTRS 02/20/2017 10:19 AM 

## 2017-02-20 NOTE — [facility] Group Notes (Signed)
[facility] Group Notes:  (Nursing/MHT/Case Management/Adjunct)  Date:  02/20/2017  Time:  3:04 AM  Type of Therapy:  Psychoeducational Skills  Participation Level:  Active  Participation Quality:  Appropriate  Affect:  Appropriate  Cognitive:  Alert  Insight:  Appropriate  Engagement in Group:  Supportive  Modes of Intervention:  Activity  Summary of Progress/Problems:  Walter Pena 02/20/2017, 3:04 AM

## 2017-02-21 DIAGNOSIS — F203 Undifferentiated schizophrenia: Secondary | ICD-10-CM

## 2017-02-21 NOTE — Progress Notes (Signed)
Quiet and reserved.  Stays to self.  Denies SI/HI/AVH.  No group attendance.  Medication compliant.  Support offered.  Safety checks maintained.

## 2017-02-21 NOTE — Plan of Care (Signed)
Problem: Coping: Goal: Ability to verbalize feelings will improve Outcome: Not Progressing Selectively mute.  Answers yes and no questions.  Forwards little.  Encouraged to verbalize feelings.

## 2017-02-21 NOTE — Progress Notes (Signed)
Denies SI/HI/AVH. Did not attend evening group. Some pacing of unit noted. Minimal interaction with staff or peers. Forwards little. Affect depressed. Medication compliant.  Encouragement and support provided. Safety maintained. Voices no additional concerns at this time. Will continue to monitor.

## 2017-02-21 NOTE — [facility] Group Notes (Signed)
[facility] LCSW Group Therapy  02/21/2017 2:48 PM  Type of Therapy:  Group Therapy  Participation Level:  Patient did not attend group. CSW invited patient to group.    Summary of Progress/Problems: Coping Skills: Patients defined and discussed healthy coping skills. Patients identified healthy coping skills they would like to try during hospitalization and after discharge. CSW offered insight to varying coping skills that may have been new to patients such as practicing mindfulness.  Ala Capri G. Garnette Czech MSW, LCSWA 02/21/2017, 2:49 PM

## 2017-02-21 NOTE — Progress Notes (Signed)
[facility] MD Progress Note  02/21/2017 2:21 PM Neta Ehlers.  MRN:  [id number] Subjective: Patient is a 36 year old single African-American male from [facility] who carries a diagnosis of schizophrenia.  Patient was brought in by police to our emergency department on March 31 after he was found wandering in his apartment complex barefoot and flashing his genitals.  Patient eloped from the emergency department. Later on he was brought back by police after he was placed on involuntary commitment.  Patient told the ER physician that he was at a football game. Per collateral information obtained from his mother: "Pt lives independently but has not paid his bills and now his lights are turned off and he has received eviction notice. Pt was found walking in the middle of the street today and when he starts to decompensate he begins to walk all over the place and also starts to get angry a lot. This is how pt typically acts when he is not taking his medicine and this has happened a number of times before. Pt history includes several group homes and several admits to [facility]. Mother has also smelled marijuana in pt's apartment recently and believes he is smoking again."  02/19/2017 Less thought blocking. Pt still does not appear to have insight as to why he is here. Say he does not feel the medications have kicked in yet. Finally slept for 5 hours last night which is the most he has slept in several days. complains of poor appetite. Denies suicidality, homicidality or hallucinations. Denies having side effects from medications. As far as physical complains, says pains from gout is less severe.  02/20/17 patient tells me he wasn't able to sleep at all last night. He kept falling asleep during assessment and had great difficulty following my questions and answering them. He continues to deny having any psychiatric symptoms. He seen pacing around and is staring at people. Nurses report he was paranoid  about medications yesterday.  Follow-up for Saturday the seventh. Patient seen chart reviewed. Patient is taking care of his hygiene and is neatly dressed. He goes to meals but otherwise is mostly staying isolated. He seems a little guarded and paranoid in the interview but that is very normal for him. Overall seems like he might be getting a little bit better. No complaints about his medicine. No other medical complaints.  Per nursing: D: Pt denies SI/HI/AVH. Pt is pleasant and cooperative. Pt  appears less anxious, paced less this afternoon, his thoughts were organized, no bizarre behavior noted, and he is interacting with peers and staff appropriately.  A: Pt was offered support and encouragement. Pt was given scheduled medications. Pt was encouraged to attend groups. Q 15 minute checks were done for safety.  R:Pt attends groups and interacts well with peers and staff. Pt is compliant with medication. .Pt receptive to treatment and safety maintained on unit.   Principal Problem: Schizophrenia (HCC) Diagnosis:   Patient Active Problem List   Diagnosis Date Noted  . Diabetes (HCC) [E11.9] 02/18/2017  . Schizophrenia (HCC) [F20.9] 02/17/2017  . Alcohol use disorder (HCC) [F10.99] 02/17/2017  . Noncompliance [Z91.19] 05/07/2016  . HTN (hypertension) [I10] 01/21/2016  . Cannabis use disorder, moderate, dependence (HCC) [F12.20] 01/21/2016  . Tobacco use disorder [F17.200] 01/21/2016  . Gout [M10.9] 01/21/2016  . Hidradenitis [L73.2] 01/21/2016   Total Time spent with patient: 30 minutes  Past Psychiatric History: Patient reports being diagnosed with schizophrenia in 1995. States that he has been hospitalized multiple times  since the age of 63. He denies any history of self injury or suicidal attempts.  Past Medical History:  Past Medical History:  Diagnosis Date  . Gout   . Hidradenitis   . Hypertension   . Schizophrenia [facility])     Past Surgical History:  Procedure Laterality Date   . sweat gland removal     Family History:  Family History  Problem Relation Age of Onset  . Diabetes Mellitus II Mother   . Hypertension Mother    Family Psychiatric  History: Patient denies having any family history of suicide, mental illness or substance abuse.  Social History: Patient lives by himself in an apartment. He is disabled due to mental illness. He is single, never married. Doesn't have any children. He denies having any legal history. As far as his education he dropped out of the 11th grade History  Alcohol Use  . Yes     History  Drug Use  . Types: Marijuana    Social History   Social History  . Marital status: Single    Spouse name: N/A  . Number of children: N/A  . Years of education: N/A   Social History Main Topics  . Smoking status: Current Every Day Smoker    Packs/day: 1.00    Years: 9.00    Types: Cigarettes  . Smokeless tobacco: Never Used  . Alcohol use Yes  . Drug use: Yes    Types: Marijuana  . Sexual activity: Not Asked   Other Topics Concern  . None   Social History Narrative  . None     Current Medications: Current Facility-Administered Medications  Medication Dose Route Frequency Provider Last Rate Last Dose  . acetaminophen (TYLENOL) tablet 650 mg  650 mg Oral Q6H PRN Audery Amel, MD      . allopurinol (ZYLOPRIM) tablet 100 mg  100 mg Oral Daily Jimmy Footman, MD   100 mg at 02/21/17 0844  . alum & mag hydroxide-simeth (MAALOX/MYLANTA) 200-200-20 MG/5ML suspension 30 mL  30 mL Oral Q4H PRN Audery Amel, MD      . amLODipine (NORVASC) tablet 10 mg  10 mg Oral Daily Jimmy Footman, MD   10 mg at 02/21/17 0844  . benazepril (LOTENSIN) tablet 20 mg  20 mg Oral Daily Jimmy Footman, MD   20 mg at 02/21/17 0844  . diphenhydrAMINE (BENADRYL) capsule 25 mg  25 mg Oral QHS Jimmy Footman, MD   25 mg at 02/20/17 2158  . indomethacin (INDOCIN) capsule 25 mg  25 mg Oral TID WC Jimmy Footman, MD   25 mg at 02/21/17 1244  . LORazepam (ATIVAN) tablet 2 mg  2 mg Oral QHS Jimmy Footman, MD   2 mg at 02/20/17 2201  . magnesium hydroxide (MILK OF MAGNESIA) suspension 30 mL  30 mL Oral Daily PRN Audery Amel, MD      . nicotine (NICODERM CQ - dosed in mg/24 hours) patch 21 mg  21 mg Transdermal Daily Jimmy Footman, MD   21 mg at 02/21/17 0845  . paliperidone (INVEGA) 24 hr tablet 12 mg  12 mg Oral QHS Jimmy Footman, MD        Lab Results:  No results found for this or any previous visit (from the past 48 hour(s)).  Blood Alcohol level:  Lab Results  Component Value Date   [facility] <5 02/14/2017   ETH <5 05/07/2016    Metabolic Disorder Labs: Lab Results  Component Value Date  HGBA1C 6.5 (H) 02/17/2017   MPG 140 02/17/2017   Lab Results  Component Value Date   PROLACTIN 27.2 (H) 05/08/2016   PROLACTIN 6.9 01/19/2016   Lab Results  Component Value Date   CHOL 118 02/17/2017   TRIG 117 02/17/2017   HDL 25 (L) 02/17/2017   CHOLHDL 4.7 02/17/2017   VLDL 23 02/17/2017   LDLCALC 70 02/17/2017   LDLCALC 68 05/08/2016    Physical Findings: AIMS:  , ,  ,  ,    CIWA:    COWS:     Musculoskeletal: Strength & Muscle Tone: within normal limits Gait & Station: normal Patient leans: N/A  Psychiatric Specialty Exam: Physical Exam  Nursing note and vitals reviewed. Constitutional: He is oriented to person, place, and time. He appears well-developed and well-nourished.  HENT:  Head: Normocephalic and atraumatic.  Eyes: Conjunctivae and EOM are normal.  Neck: Normal range of motion.  Cardiovascular: Regular rhythm.   Respiratory: Effort normal. No respiratory distress.  Musculoskeletal: Normal range of motion.  Neurological: He is alert and oriented to person, place, and time.  Skin:     Psychiatric: His affect is blunt. His speech is delayed. He is slowed. Thought content is paranoid. He expresses no suicidal  ideation. He exhibits abnormal recent memory.    Review of Systems  Constitutional: Negative.   HENT: Negative.   Eyes: Negative.   Respiratory: Negative.   Cardiovascular: Negative.   Gastrointestinal: Negative.   Genitourinary: Negative.   Musculoskeletal: Negative.   Skin: Negative.   Neurological: Negative.   Endo/Heme/Allergies: Negative.   Psychiatric/Behavioral: Positive for substance abuse. Negative for depression, hallucinations, memory loss and suicidal ideas. The patient is not nervous/anxious and does not have insomnia.     Blood pressure 128/82, pulse 82, temperature 98.7 F (37.1 C), temperature source Oral, resp. rate 20, height 5\' 6"  (1.676 m), weight 96.2 kg (212 lb), SpO2 100 %.Body mass index is 34.22 kg/m.  General Appearance: Fairly Groomed  Eye Contact:  Minimal  Speech:  Slow  Volume:  Decreased  Mood:  Dysphoric  Affect:  Blunt  Thought Process:  Disorganized   Orientation:  Full (Time, Place, and Person)  Thought Content:  Illogical   Suicidal Thoughts:  No  Homicidal Thoughts:  No  Memory:  Immediate;   Poor Recent;   Poor Remote;   Poor  Judgement:  Impaired  Insight:  Lacking  Psychomotor Activity:  Decreased  Concentration:  Concentration: Poor and Attention Span: Poor  Recall:  Poor  Fund of Knowledge:  Poor  Language:  Fair  Akathisia:  No  Handed:    AIMS (if indicated):     Assets:  Manufacturing systems engineer Social Support  ADL's:  Intact  Cognition:  WNL  Sleep:  Number of Hours: 5.45     Treatment Plan Summary:  Patient is a 36 year old African-American male with schizophrenia. He has a long history of poor compliance with medication and substance abuse.  Patient presented to our emergency department due to disorganized and bizarre behavior likely in the setting of noncompliance.  Schizophrenia: The patient has been started on Invega. Continue Invega12 mg qhs. He is in need of a long acting injectable. Looks like back in June of  last year he was treated with Haldol decanoate. Patient is unable to tell me whether or not he had any problems or side effects with haloperidol.   Patient does not appear to be open to long acting injectable at this time. We will try  to readdress this on Monday.  For insomnia: Continue Ativan but  will increase the dose to 2 mg  at bedtime  Cannabis use disorder, alcohol use disorder: Consider referral to intensive outpatient substance abuse treatment upon discharge.  For hypertension:continue Norvasc 10 mg daily and restart Lotensin. As blood pressure was elevated today I will increase Lotensin from 10 to 20 mg. If needed over the weekend and also Lotensin could be titrated up to 40 mg which was his home dose.  Diabetes continue metformin 500 mg twice a day  For gout continue colchicine, allopurinol and indocin   For tobacco use disorder: continue  nicotine patch 21 mg  Diet low sodium and carbohydrate modified  Precautions every 15 minute checks  Vital signs daily  Hospitalization status involuntary commitment  Labs I will order hemoglobin A1c, lipid panel and TSH--- hemoglobin A1c 6.5. TSH within the normal limits. Cholesterol shows decreased HDL.  Disposition patient will be discharged back to his apartment  Follow-up patient will continue to follow-up with [facility].--- Considering ACT referral  Patient seems to be doing reasonably well. Tolerating medication without difficulty. Sugars under decent control. No change to medication plan for today. Supportive counseling and encouragement and psychoeducation.   Mordecai Rasmussen, MD 02/21/2017, 2:21 PM

## 2017-02-21 NOTE — [facility] Group Notes (Signed)
[facility] Group Notes:  (Nursing/MHT/Case Management/Adjunct)  Date:  02/21/2017  Time:  1:02 AM  Type of Therapy:  Psychoeducational Skills  Participation Level:  Did Not Attend   Foy Guadalajara 02/21/2017, 1:02 AM

## 2017-02-22 NOTE — Plan of Care (Signed)
Problem: Health Behavior/Discharge Planning: Goal: Compliance with prescribed medication regimen will improve Outcome: Progressing Pt has been compliant with prescribed medications.

## 2017-02-22 NOTE — [facility] Group Notes (Signed)
April 06/2017 100 PM  Type of Therapy:  Group Therapy Emotional Regulation Participation Level:  Active  Participation Quality:  Attentive  Affect:  Appropriate  Cognitive:  Appropriate  Insight:  Engaged  Engagement in Therapy:  Developing/Improving  Modes of Intervention:  Activity, Clarification, Discussion, Education, Exploration and Socialization   The purpose of this group is to assist patients in learning to regulate negative emotions and experience positive emotions.  Patients will be guided to discuss ways in which they have been vulnerable to their negative emotions.  These vulnerabilities will be juxtaposed with experiences of positive emotions or situations, and  patients challenged to use positive emotions to combat negative ones.  Special emphasis will be placed on coping with negative emotions in conflict situations,  and patients will process healthy conflict resolution skills.    Therapeutic Goals:   1. Patient will identify two positive emotions or experiences to reflect on in order to balance out negative emotions:    2. Patient will label two or more emotions that they find the most difficult to experience:    3. Patient will be able to demonstrate positive conflict resolution skills through discussion or role plays:    Summary of Progress/Problems: The topic for today was Emotional Regulation Peers were asked to reflect on 2 incidents when they lives were unbalanced.  With facilitation from  group Leader those situations were reflected by patient and their peers.   Pt discussed coping skills that can be used for regulating emotions This patient was able to be supportive and this patients feelings was supported by this worker and peers Patient were then to review ways to improve thier emotions and to share options that assisted them in reducing thier own emotional disregulation. This patient was able to express anger and he reports he struggles to  trust others. He hopes to find a talk therapist to help him with his anger and trust issues. ( taking medications, keep doctors appointments, create a daily routine,excercize, volunteer, call friend/family and several stress reduction techniques were reviewed.)    Walter Pena April 8,2018 2:30pm

## 2017-02-22 NOTE — [facility] Group Notes (Signed)
[facility] Group Notes:  (Nursing/MHT/Case Management/Adjunct)  Date:  02/22/2017  Time:  3:29 AM  Type of Therapy:  Psychoeducational Skills  Participation Level:  Active  Participation Quality:  Appropriate  Affect:  Appropriate  Cognitive:  Appropriate  Insight:  Appropriate  Engagement in Group:  Engaged  Modes of Intervention:  Discussion, Socialization and Support  Summary of Progress/Problems:  Chancy Milroy 02/22/2017, 3:29 AM

## 2017-02-22 NOTE — Progress Notes (Signed)
Denies SI/HI/AVH.  Affect brighter.  More talkative and interactive.  Support and encouragement offered.  Safety maintained.

## 2017-02-22 NOTE — Progress Notes (Signed)
[facility] MD Progress Note  02/22/2017 1:56 PM Walter Pena.  MRN:  [id number] Subjective: Patient is a 36 year old single African-American male from [facility] who carries a diagnosis of schizophrenia.  Patient was brought in by police to our emergency department on March 31 after he was found wandering in his apartment complex barefoot and flashing his genitals.  Patient eloped from the emergency department. Later on he was brought back by police after he was placed on involuntary commitment.  Patient told the ER physician that he was at a football game. Per collateral information obtained from his mother: "Pt lives independently but has not paid his bills and now his lights are turned off and he has received eviction notice. Pt was found walking in the middle of the street today and when he starts to decompensate he begins to walk all over the place and also starts to get angry a lot. This is how pt typically acts when he is not taking his medicine and this has happened a number of times before. Pt history includes several group homes and several admits to [facility]. Mother has also smelled marijuana in pt's apartment recently and believes he is smoking again."  02/19/2017 Less thought blocking. Pt still does not appear to have insight as to why he is here. Say he does not feel the medications have kicked in yet. Finally slept for 5 hours last night which is the most he has slept in several days. complains of poor appetite. Denies suicidality, homicidality or hallucinations. Denies having side effects from medications. As far as physical complains, says pains from gout is less severe.  02/20/17 patient tells me he wasn't able to sleep at all last night. He kept falling asleep during assessment and had great difficulty following my questions and answering them. He continues to deny having any psychiatric symptoms. He seen pacing around and is staring at people. Nurses report he was paranoid  about medications yesterday.  Follow-up for Saturday the seventh. Patient seen chart reviewed. Patient is taking care of his hygiene and is neatly dressed. He goes to meals but otherwise is mostly staying isolated. He seems a little guarded and paranoid in the interview but that is very normal for him. Overall seems like he might be getting a little bit better. No complaints about his medicine. No other medical complaints.  Follow-up Sunday the eighth. Patient has no new complaints. He is neatly dressed and groomed. Taking care of his hygiene better. Affect flat age but speech is normal in rate and tone and he is not frankly bizarre or disorganized and is showing improved insight.  Per nursing: D: Pt denies SI/HI/AVH. Pt is pleasant and cooperative. Pt  appears less anxious, paced less this afternoon, his thoughts were organized, no bizarre behavior noted, and he is interacting with peers and staff appropriately.  A: Pt was offered support and encouragement. Pt was given scheduled medications. Pt was encouraged to attend groups. Q 15 minute checks were done for safety.  R:Pt attends groups and interacts well with peers and staff. Pt is compliant with medication. .Pt receptive to treatment and safety maintained on unit.   Principal Problem: Schizophrenia (HCC) Diagnosis:   Patient Active Problem List   Diagnosis Date Noted  . Diabetes (HCC) [E11.9] 02/18/2017  . Schizophrenia (HCC) [F20.9] 02/17/2017  . Alcohol use disorder (HCC) [F10.99] 02/17/2017  . Noncompliance [Z91.19] 05/07/2016  . HTN (hypertension) [I10] 01/21/2016  . Cannabis use disorder, moderate, dependence (HCC) [F12.20] 01/21/2016  .  Tobacco use disorder [F17.200] 01/21/2016  . Gout [M10.9] 01/21/2016  . Hidradenitis [L73.2] 01/21/2016   Total Time spent with patient: 30 minutes  Past Psychiatric History: Patient reports being diagnosed with schizophrenia in 1995. States that he has been hospitalized multiple times since  the age of 22. He denies any history of self injury or suicidal attempts.  Past Medical History:  Past Medical History:  Diagnosis Date  . Gout   . Hidradenitis   . Hypertension   . Schizophrenia [facility])     Past Surgical History:  Procedure Laterality Date  . sweat gland removal     Family History:  Family History  Problem Relation Age of Onset  . Diabetes Mellitus II Mother   . Hypertension Mother    Family Psychiatric  History: Patient denies having any family history of suicide, mental illness or substance abuse.  Social History: Patient lives by himself in an apartment. He is disabled due to mental illness. He is single, never married. Doesn't have any children. He denies having any legal history. As far as his education he dropped out of the 11th grade History  Alcohol Use  . Yes     History  Drug Use  . Types: Marijuana    Social History   Social History  . Marital status: Single    Spouse name: N/A  . Number of children: N/A  . Years of education: N/A   Social History Main Topics  . Smoking status: Current Every Day Smoker    Packs/day: 1.00    Years: 9.00    Types: Cigarettes  . Smokeless tobacco: Never Used  . Alcohol use Yes  . Drug use: Yes    Types: Marijuana  . Sexual activity: Not Asked   Other Topics Concern  . None   Social History Narrative  . None     Current Medications: Current Facility-Administered Medications  Medication Dose Route Frequency Provider Last Rate Last Dose  . acetaminophen (TYLENOL) tablet 650 mg  650 mg Oral Q6H PRN Audery Amel, MD      . allopurinol (ZYLOPRIM) tablet 100 mg  100 mg Oral Daily Jimmy Footman, MD   100 mg at 02/22/17 0815  . alum & mag hydroxide-simeth (MAALOX/MYLANTA) 200-200-20 MG/5ML suspension 30 mL  30 mL Oral Q4H PRN Audery Amel, MD      . amLODipine (NORVASC) tablet 10 mg  10 mg Oral Daily Jimmy Footman, MD   10 mg at 02/22/17 0815  . benazepril (LOTENSIN) tablet 20  mg  20 mg Oral Daily Jimmy Footman, MD   20 mg at 02/22/17 0815  . diphenhydrAMINE (BENADRYL) capsule 25 mg  25 mg Oral QHS Jimmy Footman, MD   25 mg at 02/21/17 2115  . indomethacin (INDOCIN) capsule 25 mg  25 mg Oral TID WC Jimmy Footman, MD   25 mg at 02/22/17 0815  . LORazepam (ATIVAN) tablet 2 mg  2 mg Oral QHS Jimmy Footman, MD   2 mg at 02/21/17 2115  . magnesium hydroxide (MILK OF MAGNESIA) suspension 30 mL  30 mL Oral Daily PRN Audery Amel, MD      . nicotine (NICODERM CQ - dosed in mg/24 hours) patch 21 mg  21 mg Transdermal Daily Jimmy Footman, MD   21 mg at 02/22/17 0815  . paliperidone (INVEGA) 24 hr tablet 12 mg  12 mg Oral QHS Jimmy Footman, MD   12 mg at 02/21/17 2115    Lab Results:  No results  found for this or any previous visit (from the past 48 hour(s)).  Blood Alcohol level:  Lab Results  Component Value Date   ETH <5 02/14/2017   ETH <5 05/07/2016    Metabolic Disorder Labs: Lab Results  Component Value Date   HGBA1C 6.5 (H) 02/17/2017   MPG 140 02/17/2017   Lab Results  Component Value Date   PROLACTIN 27.2 (H) 05/08/2016   PROLACTIN 6.9 01/19/2016   Lab Results  Component Value Date   CHOL 118 02/17/2017   TRIG 117 02/17/2017   HDL 25 (L) 02/17/2017   CHOLHDL 4.7 02/17/2017   VLDL 23 02/17/2017   LDLCALC 70 02/17/2017   LDLCALC 68 05/08/2016    Physical Findings: AIMS:  , ,  ,  ,    CIWA:    COWS:     Musculoskeletal: Strength & Muscle Tone: within normal limits Gait & Station: normal Patient leans: N/A  Psychiatric Specialty Exam: Physical Exam  Nursing note and vitals reviewed. Constitutional: He is oriented to person, place, and time. He appears well-developed and well-nourished.  HENT:  Head: Normocephalic and atraumatic.  Eyes: Conjunctivae and EOM are normal. Pupils are equal, round, and reactive to light.  Neck: Normal range of motion.  Cardiovascular:  Regular rhythm and normal heart sounds.   Respiratory: Effort normal. No respiratory distress.  GI: Soft.  Musculoskeletal: Normal range of motion.  Neurological: He is alert and oriented to person, place, and time.  Skin: Skin is warm and dry.     Psychiatric: Judgment normal. His affect is blunt. His speech is delayed. He is slowed. Thought content is not paranoid. Cognition and memory are normal. He expresses no homicidal and no suicidal ideation.    Review of Systems  Constitutional: Negative.   HENT: Negative.   Eyes: Negative.   Respiratory: Negative.   Cardiovascular: Negative.   Gastrointestinal: Negative.   Genitourinary: Negative.   Musculoskeletal: Negative.   Skin: Negative.   Neurological: Negative.   Endo/Heme/Allergies: Negative.   Psychiatric/Behavioral: Negative.     Blood pressure (!) 129/95, pulse 68, temperature 98.4 F (36.9 C), temperature source Oral, resp. rate 20, height 5\' 6"  (1.676 m), weight 96.2 kg (212 lb), SpO2 100 %.Body mass index is 34.22 kg/m.  General Appearance: Fairly Groomed  Eye Contact:  Minimal  Speech:  Slow  Volume:  Decreased  Mood:  Dysphoric  Affect:  Blunt  Thought Process:  Disorganized   Orientation:  Full (Time, Place, and Person)  Thought Content:  Illogical   Suicidal Thoughts:  No  Homicidal Thoughts:  No  Memory:  Immediate;   Poor Recent;   Poor Remote;   Poor  Judgement:  Impaired  Insight:  Lacking  Psychomotor Activity:  Decreased  Concentration:  Concentration: Poor and Attention Span: Poor  Recall:  Poor  Fund of Knowledge:  Poor  Language:  Fair  Akathisia:  No  Handed:    AIMS (if indicated):     Assets:  Manufacturing systems engineer Social Support  ADL's:  Intact  Cognition:  WNL  Sleep:  Number of Hours: 7     Treatment Plan Summary:  Patient is a 36 year old African-American male with schizophrenia. He has a long history of poor compliance with medication and substance abuse.  Patient presented  to our emergency department due to disorganized and bizarre behavior likely in the setting of noncompliance.  Schizophrenia: The patient has been started on Invega. Continue Invega12 mg qhs. He is in need of a long acting  injectable. Looks like back in June of last year he was treated with Haldol decanoate. Patient is unable to tell me whether or not he had any problems or side effects with haloperidol.   Patient does not appear to be open to long acting injectable at this time. We will try to readdress this on Monday.  For insomnia: Continue Ativan but  will increase the dose to 2 mg  at bedtime  Cannabis use disorder, alcohol use disorder: Consider referral to intensive outpatient substance abuse treatment upon discharge.  For hypertension:continue Norvasc 10 mg daily and restart Lotensin. As blood pressure was elevated today I will increase Lotensin from 10 to 20 mg. If needed over the weekend and also Lotensin could be titrated up to 40 mg which was his home dose.  Diabetes continue metformin 500 mg twice a day  For gout continue colchicine, allopurinol and indocin   For tobacco use disorder: continue  nicotine patch 21 mg  Diet low sodium and carbohydrate modified  Precautions every 15 minute checks  Vital signs daily  Hospitalization status involuntary commitment  Labs I will order hemoglobin A1c, lipid panel and TSH--- hemoglobin A1c 6.5. TSH within the normal limits. Cholesterol shows decreased HDL.  Disposition patient will be discharged back to his apartment  Follow-up patient will continue to follow-up with [facility].--- Considering ACT referral  Significantly improved. Seems to be returning to baseline. Supportive counseling completed. Possible discharge within the next day or 2.   Mordecai Rasmussen, MD 02/22/2017, 1:56 PM

## 2017-02-22 NOTE — [facility] Group Notes (Signed)
[facility] Group Notes:  (Nursing/MHT/Case Management/Adjunct)  Date:  02/22/2017  Time:  9:41 PM  Type of Therapy:  Evening Wrap-up Group  Participation Level:  Minimal  Participation Quality:  Attentive  Affect:  Appropriate  Cognitive:  Appropriate  Insight:  Improving  Engagement in Group:  Developing/Improving  Modes of Intervention:  Discussion  Summary of Progress/Problems:  Tomasita Morrow 02/22/2017, 9:41 PM

## 2017-02-22 NOTE — Progress Notes (Signed)
Denies SI/HI/AVH. Denies pain. Minimal interaction with staff or peers however noted to be responding more appropriately to assessment questions. Appears slightly less paranoid. Forwards little. Affect blunted. Medication compliant.  Encouragement and support provided. Safety maintained. Voices no additional concerns at this time. Will continue to monitor.

## 2017-02-22 NOTE — Progress Notes (Signed)
Pt Denies SI/HI/AVH. Affect noted to be brighter during interaction. Attended evening group. Appropriate  behavior with peers and staff. Improved eye contact. Seems less suspicious and paranoid. Medication compliant. Noted to have improved hygiene. Encouragement and support provided. Safety maintained. Will continue to monitor.

## 2017-02-22 NOTE — [facility] Suicide Risk Assessment (Signed)
[facility] INPATIENT:  Family/Significant Other Suicide Prevention Education  Suicide Prevention Education:  Contact Attempts:Pearl Doeden(mother [phone]), has been identified by the patient as the family member/significant other with whom the patient will be residing, and identified as the person(s) who will aid the patient in the event of a mental health crisis.  With written consent from the patient, two attempts were made to provide suicide prevention education, prior to and/or following the patient's discharge.  We were unsuccessful in providing suicide prevention education.  A suicide education pamphlet was given to the patient to share with family/significant other.  Date and time of first attempt: 02/23/2047 / 9:17am; No answer, CSW  left HIPAA compliant voicemail requesting returned call.   Halston Kintz G. Garnette Czech MSW, LCSWA 02/22/2017, 9:19 AM

## 2017-02-22 NOTE — [facility] Suicide Risk Assessment (Signed)
[facility] INPATIENT:  Family/Significant Other Suicide Prevention Education  Suicide Prevention Education:  Education Completed;Walter Pena(mother [phone]), has been identified by the patient as the family member/significant other with whom the patient will be residing, and identified as the person(s) who will aid the patient in the event of a mental health crisis (suicidal ideations/suicide attempt).  With written consent from the patient, the family member/significant other has been provided the following suicide prevention education, prior to the and/or following the discharge of the patient. Mother states that she has concerns about patient continuing to live independently. States patient would benefit from living in a more structured setting like a group home.   The suicide prevention education provided includes the following:  Suicide risk factors  Suicide prevention and interventions  National Suicide Hotline telephone number  [facility] assessment telephone number  [facility] Emergency Assistance 911  [facility] and/or Residential Mobile Crisis Unit telephone number  Request made of family/significant other to:  Remove weapons (e.g., guns, rifles, knives), all items previously/currently identified as safety concern.    Remove drugs/medications (over-the-counter, prescriptions, illicit drugs), all items previously/currently identified as a safety concern.  The family member/significant other verbalizes understanding of the suicide prevention education information provided.  The family member/significant other agrees to remove the items of safety concern listed above.  Vincenta Steffey G. Garnette Czech MSW, LCSWA 02/22/2017, 9:36 AM

## 2017-02-23 MED ORDER — PALIPERIDONE PALMITATE 234 MG/1.5ML IM SUSP
234.0000 mg | Freq: Once | INTRAMUSCULAR | Status: AC
  Administered 2017-02-23: 234 mg via INTRAMUSCULAR
  Filled 2017-02-23: qty 1.5

## 2017-02-23 MED ORDER — INDOMETHACIN 25 MG PO CAPS: 25.0000 mg | ORAL_CAPSULE | Freq: Three times a day (TID) | ORAL | Status: DC | PRN

## 2017-02-23 MED ORDER — BENAZEPRIL HCL 20 MG PO TABS
40.0000 mg | ORAL_TABLET | Freq: Every day | ORAL | Status: DC
  Administered 2017-02-24 – 2017-02-26 (×3): 40 mg via ORAL
  Filled 2017-02-23 (×4): qty 2

## 2017-02-23 NOTE — [facility] Group Notes (Signed)
[facility] LCSW Group Therapy Note  Date/Time: 02/23/17, 1300  Type of Therapy and Topic:  Group Therapy:  Overcoming Obstacles  Participation Level:  active  Description of Group:    In this group patients will be encouraged to explore what they see as obstacles to their own wellness and recovery. They will be guided to discuss their thoughts, feelings, and behaviors related to these obstacles. The group will process together ways to cope with barriers, with attention given to specific choices patients can make. Each patient will be challenged to identify changes they are motivated to make in order to overcome their obstacles. This group will be process-oriented, with patients participating in exploration of their own experiences as well as giving and receiving support and challenge from other group members.  Therapeutic Goals: 1. Patient will identify personal and current obstacles as they relate to admission. 2. Patient will identify barriers that currently interfere with their wellness or overcoming obstacles.  3. Patient will identify feelings, thought process and behaviors related to these barriers. 4. Patient will identify two changes they are willing to make to overcome these obstacles:    Summary of Patient Progress: pt arrived late to group but then was active participant.  Pt identified miscommunication which led to him not getting his medication as the obstacle that led to his current hospitalization.  He later shared that alcohol use had played a part in this as well, subsequent to the death of his father.  He identified that he needed to pay attention better to make sure he makes his appointments.      Therapeutic Modalities:   Cognitive Behavioral Therapy Solution Focused Therapy Motivational Interviewing Relapse Prevention Therapy  Daleen Squibb, LCSW

## 2017-02-23 NOTE — [facility] Group Notes (Signed)
[facility] Group Notes:  (Nursing/MHT/Case Management/Adjunct)  Date:  02/23/2017  Time:  10:59 PM  Type of Therapy:  Psychoeducational Skills  Participation Level:  Active  Participation Quality:  Appropriate and Sharing  Affect:  Appropriate  Cognitive:  Oriented  Insight:  Good  Engagement in Group:  Engaged  Modes of Intervention:  Discussion and Exploration  Summary of Progress/Problems:  Walter Pena 02/23/2017, 10:59 PM

## 2017-02-23 NOTE — Progress Notes (Signed)
[facility] MD Progress Note  02/23/2017 12:47 PM Neta Ehlers.  MRN:  [id number] Subjective: Patient is a 36 year old single African-American male from [facility] who carries a diagnosis of schizophrenia.  Patient was brought in by police to our emergency department on March 31 after he was found wandering in his apartment complex barefoot and flashing his genitals.  Patient eloped from the emergency department. Later on he was brought back by police after he was placed on involuntary commitment.  Patient told the ER physician that he was at a football game. Per collateral information obtained from his mother: "Pt lives independently but has not paid his bills and now his lights are turned off and he has received eviction notice. Pt was found walking in the middle of the street today and when he starts to decompensate he begins to walk all over the place and also starts to get angry a lot. This is how pt typically acts when he is not taking his medicine and this has happened a number of times before. Pt history includes several group homes and several admits to [facility]. Mother has also smelled marijuana in pt's apartment recently and believes he is smoking again."  02/19/2017 Less thought blocking. Pt still does not appear to have insight as to why he is here. Say he does not feel the medications have kicked in yet. Finally slept for 5 hours last night which is the most he has slept in several days. complains of poor appetite. Denies suicidality, homicidality or hallucinations. Denies having side effects from medications. As far as physical complains, says pains from gout is less severe.  02/20/17 patient tells me he wasn't able to sleep at all last night. He kept falling asleep during assessment and had great difficulty following my questions and answering them. He continues to deny having any psychiatric symptoms. He seen pacing around and is staring at people. Nurses report he was  paranoid about medications yesterday.  Follow-up for Saturday the seventh. Patient seen chart reviewed. Patient is taking care of his hygiene and is neatly dressed. He goes to meals but otherwise is mostly staying isolated. He seems a little guarded and paranoid in the interview but that is very normal for him. Overall seems like he might be getting a little bit better. No complaints about his medicine. No other medical complaints.  Follow-up Sunday the eighth. Patient has no new complaints. He is neatly dressed and groomed. Taking care of his hygiene better. Affect flat age but speech is normal in rate and tone and he is not frankly bizarre or disorganized and is showing improved insight.  02/23/17 patient reports doing well. He does not have major issues or concerns. His thought process is much more linear and organized. He still has no insight but is less guarded and suspicious. No evidence of thought blocking today. He has been is sleeping much better this weekend. He says he is tolerating medications well. He is open to injectable medication today. Denies any suicidality, homicidality or auditory or visual hallucinations. He denies any problems with sleep appetite, energy or concentration. Denies physical complaints or side effects.  Per nursing: Pt Denies SI/HI/AVH. Affect noted to be brighter during interaction. Attended evening group. Appropriate  behavior with peers and staff. Improved eye contact. Seems less suspicious and paranoid. Medication compliant. Noted to have improved hygiene. Encouragement and support provided. Safety maintained. Will continue to monitor.   Principal Problem: Schizophrenia (HCC) Diagnosis:   Patient Active Problem List  Diagnosis Date Noted  . Diabetes (HCC) [E11.9] 02/18/2017  . Schizophrenia (HCC) [F20.9] 02/17/2017  . Alcohol use disorder (HCC) [F10.99] 02/17/2017  . Noncompliance [Z91.19] 05/07/2016  . HTN (hypertension) [I10] 01/21/2016  . Cannabis use  disorder, moderate, dependence (HCC) [F12.20] 01/21/2016  . Tobacco use disorder [F17.200] 01/21/2016  . Gout [M10.9] 01/21/2016  . Hidradenitis [L73.2] 01/21/2016   Total Time spent with patient: 30 minutes  Past Psychiatric History: Patient reports being diagnosed with schizophrenia in 1995. States that he has been hospitalized multiple times since the age of 14. He denies any history of self injury or suicidal attempts.  Past Medical History:  Past Medical History:  Diagnosis Date  . Gout   . Hidradenitis   . Hypertension   . Schizophrenia [facility])     Past Surgical History:  Procedure Laterality Date  . sweat gland removal     Family History:  Family History  Problem Relation Age of Onset  . Diabetes Mellitus II Mother   . Hypertension Mother    Family Psychiatric  History: Patient denies having any family history of suicide, mental illness or substance abuse.  Social History: Patient lives by himself in an apartment. He is disabled due to mental illness. He is single, never married. Doesn't have any children. He denies having any legal history. As far as his education he dropped out of the 11th grade History  Alcohol Use  . Yes     History  Drug Use  . Types: Marijuana    Social History   Social History  . Marital status: Single    Spouse name: N/A  . Number of children: N/A  . Years of education: N/A   Social History Main Topics  . Smoking status: Current Every Day Smoker    Packs/day: 1.00    Years: 9.00    Types: Cigarettes  . Smokeless tobacco: Never Used  . Alcohol use Yes  . Drug use: Yes    Types: Marijuana  . Sexual activity: Not Asked   Other Topics Concern  . None   Social History Narrative  . None     Current Medications: Current Facility-Administered Medications  Medication Dose Route Frequency Provider Last Rate Last Dose  . acetaminophen (TYLENOL) tablet 650 mg  650 mg Oral Q6H PRN Audery Amel, MD      . allopurinol (ZYLOPRIM)  tablet 100 mg  100 mg Oral Daily Jimmy Footman, MD   100 mg at 02/23/17 0840  . alum & mag hydroxide-simeth (MAALOX/MYLANTA) 200-200-20 MG/5ML suspension 30 mL  30 mL Oral Q4H PRN Audery Amel, MD      . amLODipine (NORVASC) tablet 10 mg  10 mg Oral Daily Jimmy Footman, MD   10 mg at 02/23/17 0840  . [START ON 02/24/2017] benazepril (LOTENSIN) tablet 40 mg  40 mg Oral Daily Jimmy Footman, MD      . diphenhydrAMINE (BENADRYL) capsule 25 mg  25 mg Oral QHS Jimmy Footman, MD   25 mg at 02/22/17 2211  . indomethacin (INDOCIN) capsule 25 mg  25 mg Oral TID PRN Jimmy Footman, MD      . LORazepam (ATIVAN) tablet 2 mg  2 mg Oral QHS Jimmy Footman, MD   2 mg at 02/22/17 2212  . magnesium hydroxide (MILK OF MAGNESIA) suspension 30 mL  30 mL Oral Daily PRN Audery Amel, MD      . nicotine (NICODERM CQ - dosed in mg/24 hours) patch 21 mg  21 mg Transdermal  Daily Jimmy Footman, MD   21 mg at 02/23/17 0840  . paliperidone (INVEGA SUSTENNA) injection 234 mg  234 mg Intramuscular Once Jimmy Footman, MD      . paliperidone (INVEGA) 24 hr tablet 12 mg  12 mg Oral QHS Jimmy Footman, MD   12 mg at 02/22/17 2211    Lab Results:  No results found for this or any previous visit (from the past 48 hour(s)).  Blood Alcohol level:  Lab Results  Component Value Date   ETH <5 02/14/2017   ETH <5 05/07/2016    Metabolic Disorder Labs: Lab Results  Component Value Date   HGBA1C 6.5 (H) 02/17/2017   MPG 140 02/17/2017   Lab Results  Component Value Date   PROLACTIN 27.2 (H) 05/08/2016   PROLACTIN 6.9 01/19/2016   Lab Results  Component Value Date   CHOL 118 02/17/2017   TRIG 117 02/17/2017   HDL 25 (L) 02/17/2017   CHOLHDL 4.7 02/17/2017   VLDL 23 02/17/2017   LDLCALC 70 02/17/2017   LDLCALC 68 05/08/2016    Physical Findings: AIMS:  , ,  ,  ,    CIWA:    COWS:      Musculoskeletal: Strength & Muscle Tone: within normal limits Gait & Station: normal Patient leans: N/A  Psychiatric Specialty Exam: Physical Exam  Nursing note and vitals reviewed. Constitutional: He is oriented to person, place, and time. He appears well-developed and well-nourished.  HENT:  Head: Normocephalic and atraumatic.  Eyes: Conjunctivae and EOM are normal. Pupils are equal, round, and reactive to light.  Neck: Normal range of motion.  Cardiovascular: Regular rhythm and normal heart sounds.   Respiratory: Effort normal. No respiratory distress.  GI: Soft.  Musculoskeletal: Normal range of motion.  Neurological: He is alert and oriented to person, place, and time.  Skin: Skin is warm and dry.     Psychiatric: Judgment normal. His affect is blunt. His speech is delayed. He is slowed. Thought content is not paranoid. Cognition and memory are normal. He expresses no homicidal and no suicidal ideation.    Review of Systems  Constitutional: Negative.   HENT: Negative.   Eyes: Negative.   Respiratory: Negative.   Cardiovascular: Negative.   Gastrointestinal: Negative.   Genitourinary: Negative.   Musculoskeletal: Negative.   Skin: Negative.   Neurological: Negative.   Endo/Heme/Allergies: Negative.   Psychiatric/Behavioral: Negative.     Blood pressure 135/78, pulse 75, temperature 98.2 F (36.8 C), resp. rate 20, height 5\' 6"  (1.676 m), weight 96.2 kg (212 lb), SpO2 100 %.Body mass index is 34.22 kg/m.  General Appearance: Fairly Groomed  Eye Contact:  Minimal  Speech:  Slow  Volume:  Decreased  Mood:  Dysphoric  Affect:  Blunt  Thought Process:  Disorganized   Orientation:  Full (Time, Place, and Person)  Thought Content:  Illogical   Suicidal Thoughts:  No  Homicidal Thoughts:  No  Memory:  Immediate;   Poor Recent;   Poor Remote;   Poor  Judgement:  Impaired  Insight:  Lacking  Psychomotor Activity:  Decreased  Concentration:  Concentration: Poor  and Attention Span: Poor  Recall:  Poor  Fund of Knowledge:  Poor  Language:  Fair  Akathisia:  No  Handed:    AIMS (if indicated):     Assets:  Manufacturing systems engineer Social Support  ADL's:  Intact  Cognition:  WNL  Sleep:  Number of Hours: 7     Treatment Plan Summary:  Patient is  a 36 year old African-American male with schizophrenia. He has a long history of poor compliance with medication and substance abuse.  Patient presented to our emergency department due to disorganized and bizarre behavior likely in the setting of noncompliance.  Schizophrenia: Continue Invega 12 mg by mouth daily at bedtime.  Will order Invega injectable 234 mg today. He will receive 156 IM in 4 days.  For insomnia: Continue Ativan 2 mg daily at bedtime  Cannabis use disorder, alcohol use disorder: Consider referral to intensive outpatient substance abuse treatment upon discharge.  For hypertension:continue Norvasc 10 mg daily and Lotensin 40 mg a day  Diabetes continue metformin 500 mg twice a day  For gout continue: Continue Allopurinol. Indocin  only when necessary  For tobacco use disorder: continue  nicotine patch 21 mg  Diet low sodium and carbohydrate modified  Precautions every 15 minute checks  Vital signs daily  Hospitalization status involuntary commitment--- Court hearing tomorrow for involuntary commitment. Patient declines to go.  Labs I will order hemoglobin A1c, lipid panel and TSH--- hemoglobin A1c 6.5. TSH within the normal limits. Cholesterol shows decreased HDL.  Disposition patient will be discharged back to his apartment  Follow-up patient will continue to follow-up with [facility].--- Considering ACT referral  Will likely discharge on Thursday after he received [place] 156mg .  Jimmy Footman, MD 02/23/2017, 12:47 PM

## 2017-02-23 NOTE — Plan of Care (Signed)
Problem: Safety: Goal: Ability to remain free from injury will improve Outcome: Progressing No injury reported or observed   

## 2017-02-23 NOTE — Tx Team (Signed)
Interdisciplinary Treatment and Diagnostic Plan Update  02/23/2017 Time of Session: 11:00am Walter Pena. MRN: [id number]  Principal Diagnosis: Schizophrenia (HCC)  Secondary Diagnoses: Principal Problem:   Schizophrenia (HCC) Active Problems:   HTN (hypertension)   Cannabis use disorder, moderate, dependence (HCC)   Tobacco use disorder   Gout   Hidradenitis   Noncompliance   Alcohol use disorder (HCC)   Diabetes (HCC)   Current Medications:  Current Facility-Administered Medications  Medication Dose Route Frequency Provider Last Rate Last Dose  . acetaminophen (TYLENOL) tablet 650 mg  650 mg Oral Q6H PRN Audery Amel, MD      . allopurinol (ZYLOPRIM) tablet 100 mg  100 mg Oral Daily Jimmy Footman, MD   100 mg at 02/23/17 0840  . alum & mag hydroxide-simeth (MAALOX/MYLANTA) 200-200-20 MG/5ML suspension 30 mL  30 mL Oral Q4H PRN Audery Amel, MD      . amLODipine (NORVASC) tablet 10 mg  10 mg Oral Daily Jimmy Footman, MD   10 mg at 02/23/17 0840  . [START ON 02/24/2017] benazepril (LOTENSIN) tablet 40 mg  40 mg Oral Daily Jimmy Footman, MD      . diphenhydrAMINE (BENADRYL) capsule 25 mg  25 mg Oral QHS Jimmy Footman, MD   25 mg at 02/22/17 2211  . indomethacin (INDOCIN) capsule 25 mg  25 mg Oral TID PRN Jimmy Footman, MD      . LORazepam (ATIVAN) tablet 2 mg  2 mg Oral QHS Jimmy Footman, MD   2 mg at 02/22/17 2212  . magnesium hydroxide (MILK OF MAGNESIA) suspension 30 mL  30 mL Oral Daily PRN Audery Amel, MD      . nicotine (NICODERM CQ - dosed in mg/24 hours) patch 21 mg  21 mg Transdermal Daily Jimmy Footman, MD   21 mg at 02/23/17 0840  . paliperidone (INVEGA SUSTENNA) injection 234 mg  234 mg Intramuscular Once Jimmy Footman, MD      . paliperidone (INVEGA) 24 hr tablet 12 mg  12 mg Oral QHS Jimmy Footman, MD   12 mg at 02/22/17 2211   PTA  Medications: Prescriptions Prior to Admission  Medication Sig Dispense Refill Last Dose  . allopurinol (ZYLOPRIM) 100 MG tablet Take 100 mg by mouth daily.     Marland Kitchen amLODipine (NORVASC) 10 MG tablet Take 10 mg by mouth daily.     . benazepril (LOTENSIN) 40 MG tablet Take 1 tablet by mouth daily.     . benztropine (COGENTIN) 1 MG tablet Take 1 mg by mouth daily.   Taking  . colchicine 0.6 MG tablet 1 tablet by mouth daily until gout pain is gone. 15 tablet 0 Taking    Patient Stressors: Medication change or noncompliance Substance abuse  Patient Strengths: Capable of independent living Motivation for treatment/growth  Treatment Modalities: Medication Management, Group therapy, Case management,  1 to 1 session with clinician, Psychoeducation, Recreational therapy.   Physician Treatment Plan for Primary Diagnosis: Schizophrenia (HCC) Long Term Goal(s): Improvement in symptoms so as ready for discharge Improvement in symptoms so as ready for discharge   Short Term Goals: Compliance with prescribed medications will improve Ability to identify triggers associated with substance abuse/mental health issues will improve Ability to identify changes in lifestyle to reduce recurrence of condition will improve Ability to demonstrate self-control will improve  Medication Management: Evaluate patient's response, side effects, and tolerance of medication regimen.  Therapeutic Interventions: 1 to 1 sessions, Unit Group sessions and Medication administration.  Evaluation  of Outcomes: Progressing  Physician Treatment Plan for Secondary Diagnosis: Principal Problem:   Schizophrenia (HCC) Active Problems:   HTN (hypertension)   Cannabis use disorder, moderate, dependence (HCC)   Tobacco use disorder   Gout   Hidradenitis   Noncompliance   Alcohol use disorder (HCC)   Diabetes (HCC)  Long Term Goal(s): Improvement in symptoms so as ready for discharge Improvement in symptoms so as ready for  discharge   Short Term Goals: Compliance with prescribed medications will improve Ability to identify triggers associated with substance abuse/mental health issues will improve Ability to identify changes in lifestyle to reduce recurrence of condition will improve Ability to demonstrate self-control will improve     Medication Management: Evaluate patient's response, side effects, and tolerance of medication regimen.  Therapeutic Interventions: 1 to 1 sessions, Unit Group sessions and Medication administration.  Evaluation of Outcomes: Progressing   RN Treatment Plan for Primary Diagnosis: Schizophrenia (HCC) Long Term Goal(s): Knowledge of disease and therapeutic regimen to maintain health will improve  Short Term Goals: Ability to verbalize feelings will improve and Compliance with prescribed medications will improve  Medication Management: RN will administer medications as ordered by provider, will assess and evaluate patient's response and provide education to patient for prescribed medication. RN will report any adverse and/or side effects to prescribing provider.  Therapeutic Interventions: 1 on 1 counseling sessions, Psychoeducation, Medication administration, Evaluate responses to treatment, Monitor vital signs and CBGs as ordered, Perform/monitor CIWA, COWS, AIMS and Fall Risk screenings as ordered, Perform wound care treatments as ordered.  Evaluation of Outcomes: Progressing   LCSW Treatment Plan for Primary Diagnosis: Schizophrenia (HCC) Long Term Goal(s): Safe transition to appropriate next level of care at discharge, Engage patient in therapeutic group addressing interpersonal concerns.  Short Term Goals: Engage patient in aftercare planning with referrals and resources and Increase skills for wellness and recovery  Therapeutic Interventions: Assess for all discharge needs, 1 to 1 time with Social worker, Explore available resources and support systems, Assess for  adequacy in community support network, Educate family and significant other(s) on suicide prevention, Complete Psychosocial Assessment, Interpersonal group therapy.  Evaluation of Outcomes: Progressing    Recreational Therapy Treatment Plan for Primary Diagnosis: Schizophrenia (HCC) Long Term Goal(s): Patient will participate in recreation therapy treatment in at least 2 group sessions without prompting from LRT  Short Term Goals: Increase self-esteem, Increase stress management skills  Treatment Modalities: Group Therapy and Individual Treatment Sessions  Therapeutic Interventions: Psychoeducation  Evaluation of Outcomes: Progressing   Progress in Treatment: Attending groups: Yes. Participating in groups: Yes. Taking medication as prescribed: Yes. Toleration medication: Yes. Family/Significant other contact made: Yes, mother. Patient understands diagnosis: Yes. Discussing patient identified problems/goals with staff: Yes. Medical problems stabilized or resolved: Yes. Denies suicidal/homicidal ideation: Yes. Issues/concerns per patient self-inventory: No. Other: n/a  New problem(s) identified: None identified at this time.   New Short Term/Long Term Goal(s): None identified at this time.   Discharge Plan or Barriers: CSW seeking ACT team for pt.  Reason for Continuation of Hospitalization: Medication stabilization Other; describe paranoia  Estimated Length of Stay: 2-3 days.   Attendees: Patient:  02/23/2017   Physician: Dr. Jimmy Footman, MD 02/23/2017   Nursing: Shelia Media, RN 02/23/2017   RN Care Manager: 02/23/2017   Social Worker: Daleen Squibb MSW, LCSW 02/23/2017   Recreational Therapist: Jacquelynn Cree, LRT/CTRS 02/23/2017   Other:  02/23/2017   Other:  02/23/2017   Other: 02/23/2017  Scribe for Treatment Team: Wyn Quaker 02/23/2017 12:48 PM

## 2017-02-23 NOTE — Progress Notes (Signed)
Recreation Therapy Notes  Date: 04.09.18 Time: 9:30 am Location: Craft Room  Group Topic: Self-expression  Goal Area(s) Addresses:  Patient will effectively use art as a means of self-expression. Patient will recognize positive benefit of self-expression. Patient will be able to identify one emotion experienced during group session. Patient will identify use of art as a coping skill.  Behavioral Response: Attentive, Interactive  Intervention: Two Face of Me  Activity: Patients were given a blank face worksheet and were instructed to draw a line down the middle. On one side, they were instructed to draw or write how they felt when they were admitted to the hospital. On the other side, they were instructed to draw or write how they want to feel when they are d/c.  Education: LRT educated patients on other forms of self-expression.  Education Outcome: In group clarification offered  Clinical Observations/Feedback: Patient wrote how he felt when he was admitted and how he wants to feel when he is d/c. Patient contributed to group discussion by stating how the two sides were different.  Jacquelynn Cree, LRT/CTRS 02/23/2017 10:08 AM

## 2017-02-23 NOTE — Progress Notes (Signed)
Pt pleasant and cooperative with care. Med and group compliant, appropriate with staff and peers. Denies SI, HI, AVH. Noted smiling, laughing and talking with peers. Encouragement and support offered. Safety checks maintained. Pt receptive and remains safe on unit with q 15 min checks

## 2017-02-23 NOTE — Progress Notes (Signed)
February 23, 2017.  Patient Identification: Walter Pena. MRN:  [id number] Chief Complaint:  Disorganized Schzophrenia Principal Diagnosis: Schizophrenia (HCC)  To [address]:  Patient is a 36 year old single African-American male from [facility] [place] who carries a diagnosis of schizophrenia.   Patient was brought in by police to our emergency department on March 31 after he was found wandering in his apartment complex barefoot and flashing his genitals.   Patient eloped from the emergency department. Later on he was brought back by police after he was placed on involuntary commitment.   Patient told the ER physician that he was at a football game. Per collateral information obtained from his mother: "Pt lives independently but has not paid his bills and now his lights are turned off and he has received eviction notice.  Pt was found walking in the middle of the street today and when he starts to decompensate he begins to walk all over the place and also starts to get angry a lot.  This is how pt typically acts when he is not taking his medicine and this has happened a number of times before.  Pt history includes several group homes and several admits to [facility].  Mother has also smelled marijuana in pt's apartment recently and believes he is smoking again."   Today during assessment, the patient displayed some episodes of thought blocking. At times and of his answers were completely unrelated to the questions. He tells me he has been diagnosed with schizophrenia and receives disability. He follows up with Dr. Franz Dell at [facility]. Patient is unable to remember the name of the medications he is prescribed with for schizophrenia. He claims that he has been taking medications daily as prescribed.   Patient is unable to tell me what brought him to the hospital. He does not feel he needs to be.    He reports drinking half a gallon of liquor per day. However he is not withdrawing and his toxicology  screen was negative for alcohol. AST is only minimally elevated at 50. Patient says he has been smoking marijuana daily. He smokes about one pack of cigarettes per day.    Per records his last hospitalization in our unit was in 2017. He was discharged on Haldol oral and Haldol decanoate.  Patient has been started on paliperidone. The dose has been slowly increased. Patient is still not at baseline. He continues to be paranoid and suspicious. I recommend to extend his involuntary commitment for up to 30 days.  Sincerely,  Radene Journey MD Behavioral Health unit at [facility].

## 2017-02-23 NOTE — [facility] Group Notes (Signed)
[facility] Group Notes:  (Nursing/MHT/Case Management/Adjunct)  Date:  02/23/2017  Time:  6:26 PM  Type of Therapy:  Psychoeducational Skills  Participation Level:  Active  Participation Quality:  Appropriate  Affect:  Appropriate  Cognitive:  Appropriate  Insight:  Appropriate  Engagement in Group:  Engaged  Modes of Intervention:  Socialization  Summary of Progress/Problems:  Walter Pena 02/23/2017, 6:26 PM

## 2017-02-23 NOTE — Plan of Care (Signed)
Problem: [facility] Participation in Recreation Therapeutic Interventions Goal: STG-Patient will demonstrate improved self esteem by identif STG: Self-Esteem - Within 4 treatment sessions, patient will verbalize at least 5 positive affirmation statements in each of 2 treatment sessions to increase self-esteem.  Outcome: Progressing Treatment Session 1; Completed 1 out of 2: At approximately 2:40 pm, LRT met with patient in consultation room. Patient verbalized 5 positive affirmation statements. Patient reported it felt "good". LRT encouraged patient to continue saying positive affirmation statements.  Leonette Monarch, LRT/CTRS 04.09.18 3:04 pm Goal: STG-Other Recreation Therapy Goal (Specify) STG: Stress Management - Within 4 treatment sessions, patient will verbalize understanding of the stress management techniques in each of 2 treatment sessions to increase stress management skills.  Outcome: Progressing Treatment Session 1; Completed 1 out of 2: At approximately 2:40 pm, LRT met with patient in consultation room. LRT educated and provided patient with handouts on stress management techniques. Patient verbalized understanding. LRT encouraged patient to read over and practice the stress management techniques.  Leonette Monarch, LRT/CTRS 04.09.18 3:05 pm

## 2017-02-24 NOTE — Plan of Care (Signed)
Problem: Health Behavior/Discharge Planning: Goal: Compliance with treatment plan for underlying cause of condition will improve Outcome: Progressing Pt med and group compliant

## 2017-02-24 NOTE — [facility] Group Notes (Signed)
[facility] Group Notes:  (Nursing/MHT/Case Management/Adjunct)  Date:  02/24/2017  Time:  2:51 PM  Type of Therapy:  Psychoeducational Skills  Participation Level:  Active  Participation Quality:  Appropriate, Attentive and Supportive  Affect:  Appropriate  Cognitive:  Appropriate  Insight:  Appropriate  Engagement in Group:  Engaged and Supportive  Modes of Intervention:  Discussion and Education  Summary of Progress/Problems:  Twanna Hy 02/24/2017, 2:51 PM

## 2017-02-24 NOTE — [facility] Group Notes (Signed)
[facility] LCSW Group Therapy Note  Date/Time: 02/24/2017, 2:00 PM  Type of Therapy/Topic:  Group Therapy:  Feelings about Diagnosis  Participation Level:  None   Description of Group:    This group will allow patients to explore their thoughts and feelings about diagnoses they have received. Patients will be guided to explore their level of understanding and acceptance of these diagnoses. Facilitator will encourage patients to process their thoughts and feelings about the reactions of others to their diagnosis, and will guide patients in identifying ways to discuss their diagnosis with significant others in their lives. This group will be process-oriented, with patients participating in exploration of their own experiences as well as giving and receiving support and challenge from other group members.   Therapeutic Goals: 1. Patient will demonstrate understanding of diagnosis as evidence by identifying two or more symptoms of the disorder:  2. Patient will be able to express two feelings regarding the diagnosis 3. Patient will demonstrate ability to communicate their needs through discussion and/or role plays    Therapeutic Modalities:   Cognitive Behavioral Therapy Brief Therapy Feelings Identification   Hampton Abbot, MSW, LCSW-A 02/24/2017, 3:16PM

## 2017-02-24 NOTE — Progress Notes (Signed)
[facility] MD Progress Note  02/24/2017 4:22 PM Neta Ehlers.  MRN:  [id number] Subjective: Patient is a 36 year old single African-American male from [facility] who carries a diagnosis of schizophrenia.  Patient was brought in by police to our emergency department on March 31 after he was found wandering in his apartment complex barefoot and flashing his genitals.  Patient eloped from the emergency department. Later on he was brought back by police after he was placed on involuntary commitment.  Patient told the ER physician that he was at a football game. Per collateral information obtained from his mother: "Pt lives independently but has not paid his bills and now his lights are turned off and he has received eviction notice. Pt was found walking in the middle of the street today and when he starts to decompensate he begins to walk all over the place and also starts to get angry a lot. This is how pt typically acts when he is not taking his medicine and this has happened a number of times before. Pt history includes several group homes and several admits to [facility]. Mother has also smelled marijuana in pt's apartment recently and believes he is smoking again."  02/19/2017 Less thought blocking. Pt still does not appear to have insight as to why he is here. Say he does not feel the medications have kicked in yet. Finally slept for 5 hours last night which is the most he has slept in several days. complains of poor appetite. Denies suicidality, homicidality or hallucinations. Denies having side effects from medications. As far as physical complains, says pains from gout is less severe.  02/20/17 patient tells me he wasn't able to sleep at all last night. He kept falling asleep during assessment and had great difficulty following my questions and answering them. He continues to deny having any psychiatric symptoms. He seen pacing around and is staring at people. Nurses report he was  paranoid about medications yesterday.  Follow-up for Saturday the seventh. Patient seen chart reviewed. Patient is taking care of his hygiene and is neatly dressed. He goes to meals but otherwise is mostly staying isolated. He seems a little guarded and paranoid in the interview but that is very normal for him. Overall seems like he might be getting a little bit better. No complaints about his medicine. No other medical complaints.  Follow-up Sunday the eighth. Patient has no new complaints. He is neatly dressed and groomed. Taking care of his hygiene better. Affect flat age but speech is normal in rate and tone and he is not frankly bizarre or disorganized and is showing improved insight.  02/23/17 patient reports doing well. He does not have major issues or concerns. His thought process is much more linear and organized. He still has no insight but is less guarded and suspicious. No evidence of thought blocking today. He has been is sleeping much better this weekend. He says he is tolerating medications well. He is open to injectable medication today. Denies any suicidality, homicidality or auditory or visual hallucinations. He denies any problems with sleep appetite, energy or concentration. Denies physical complaints or side effects.  02/24/17 patient reports doing well. He denies having any issues or concerns. He received Invega 234 mg on Monday. He reports he isn't sleeping much better. He continues to deny having any auditory or visual hallucinations or any problems with mood, appetite, energy or concentration. The patient has been attending some groups. His thought process, hygiene and ability to express  himself has significantly improved.  Per nursing: Pt continues to be bright on approach. Denies SI, HI, AVH. Pt talking with mother in reference to paying his rent. Denies SI, HI, AVH. No negative behaviors. Medication and group compliant.  Encouragement and support offered. Safety checks  maintained. Pt receptive and remains safe on unit with q  Min checks.  Improved appearance, decrease body odor, Invega 234 mg IM Loading Dose given to the left deltoid process, education about medication provided, questions encouraged; patient continues to have bright affect, happy, interacting well with peers and staffs, no odd bizarre behavior; anticipating discharge.  Principal Problem: Schizophrenia (HCC) Diagnosis:   Patient Active Problem List   Diagnosis Date Noted  . Diabetes (HCC) [E11.9] 02/18/2017  . Schizophrenia (HCC) [F20.9] 02/17/2017  . Alcohol use disorder (HCC) [F10.99] 02/17/2017  . Noncompliance [Z91.19] 05/07/2016  . HTN (hypertension) [I10] 01/21/2016  . Cannabis use disorder, moderate, dependence (HCC) [F12.20] 01/21/2016  . Tobacco use disorder [F17.200] 01/21/2016  . Gout [M10.9] 01/21/2016  . Hidradenitis [L73.2] 01/21/2016   Total Time spent with patient: 30 minutes  Past Psychiatric History: Patient reports being diagnosed with schizophrenia in 1995. States that he has been hospitalized multiple times since the age of 54. He denies any history of self injury or suicidal attempts.  Past Medical History:  Past Medical History:  Diagnosis Date  . Gout   . Hidradenitis   . Hypertension   . Schizophrenia [facility])     Past Surgical History:  Procedure Laterality Date  . sweat gland removal     Family History:  Family History  Problem Relation Age of Onset  . Diabetes Mellitus II Mother   . Hypertension Mother    Family Psychiatric  History: Patient denies having any family history of suicide, mental illness or substance abuse.  Social History: Patient lives by himself in an apartment. He is disabled due to mental illness. He is single, never married. Doesn't have any children. He denies having any legal history. As far as his education he dropped out of the 11th grade History  Alcohol Use  . Yes     History  Drug Use  . Types: Marijuana      Social History   Social History  . Marital status: Single    Spouse name: N/A  . Number of children: N/A  . Years of education: N/A   Social History Main Topics  . Smoking status: Current Every Day Smoker    Packs/day: 1.00    Years: 9.00    Types: Cigarettes  . Smokeless tobacco: Never Used  . Alcohol use Yes  . Drug use: Yes    Types: Marijuana  . Sexual activity: Not Asked   Other Topics Concern  . None   Social History Narrative  . None     Current Medications: Current Facility-Administered Medications  Medication Dose Route Frequency Provider Last Rate Last Dose  . acetaminophen (TYLENOL) tablet 650 mg  650 mg Oral Q6H PRN Audery Amel, MD      . allopurinol (ZYLOPRIM) tablet 100 mg  100 mg Oral Daily Jimmy Footman, MD   100 mg at 02/24/17 0811  . alum & mag hydroxide-simeth (MAALOX/MYLANTA) 200-200-20 MG/5ML suspension 30 mL  30 mL Oral Q4H PRN Audery Amel, MD      . amLODipine (NORVASC) tablet 10 mg  10 mg Oral Daily Jimmy Footman, MD   10 mg at 02/24/17 0811  . benazepril (LOTENSIN) tablet 40 mg  40 mg  Oral Daily Jimmy Footman, MD   40 mg at 02/24/17 0811  . diphenhydrAMINE (BENADRYL) capsule 25 mg  25 mg Oral QHS Jimmy Footman, MD   25 mg at 02/23/17 2124  . indomethacin (INDOCIN) capsule 25 mg  25 mg Oral TID PRN Jimmy Footman, MD      . LORazepam (ATIVAN) tablet 2 mg  2 mg Oral QHS Jimmy Footman, MD   2 mg at 02/23/17 2125  . magnesium hydroxide (MILK OF MAGNESIA) suspension 30 mL  30 mL Oral Daily PRN Audery Amel, MD      . nicotine (NICODERM CQ - dosed in mg/24 hours) patch 21 mg  21 mg Transdermal Daily Jimmy Footman, MD   21 mg at 02/24/17 0811  . paliperidone (INVEGA) 24 hr tablet 12 mg  12 mg Oral QHS Jimmy Footman, MD   12 mg at 02/23/17 2124    Lab Results:  No results found for this or any previous visit (from the past 48 hour(s)).  Blood Alcohol  level:  Lab Results  Component Value Date   ETH <5 02/14/2017   ETH <5 05/07/2016    Metabolic Disorder Labs: Lab Results  Component Value Date   HGBA1C 6.5 (H) 02/17/2017   MPG 140 02/17/2017   Lab Results  Component Value Date   PROLACTIN 27.2 (H) 05/08/2016   PROLACTIN 6.9 01/19/2016   Lab Results  Component Value Date   CHOL 118 02/17/2017   TRIG 117 02/17/2017   HDL 25 (L) 02/17/2017   CHOLHDL 4.7 02/17/2017   VLDL 23 02/17/2017   LDLCALC 70 02/17/2017   LDLCALC 68 05/08/2016    Physical Findings: AIMS:  , ,  ,  ,    CIWA:    COWS:     Musculoskeletal: Strength & Muscle Tone: within normal limits Gait & Station: normal Patient leans: N/A  Psychiatric Specialty Exam: Physical Exam  Nursing note and vitals reviewed. Constitutional: He is oriented to person, place, and time. He appears well-developed and well-nourished.  HENT:  Head: Normocephalic and atraumatic.  Eyes: Conjunctivae and EOM are normal. Pupils are equal, round, and reactive to light.  Neck: Normal range of motion.  Cardiovascular: Regular rhythm and normal heart sounds.   Respiratory: Effort normal. No respiratory distress.  GI: Soft.  Musculoskeletal: Normal range of motion.  Neurological: He is alert and oriented to person, place, and time.  Skin: Skin is warm and dry.     Psychiatric: Judgment normal. His affect is blunt. His speech is delayed. He is slowed. Thought content is not paranoid. Cognition and memory are normal. He expresses no homicidal and no suicidal ideation.    Review of Systems  Constitutional: Negative.   HENT: Negative.   Eyes: Negative.   Respiratory: Negative.   Cardiovascular: Negative.   Gastrointestinal: Negative.   Genitourinary: Negative.   Musculoskeletal: Negative.   Skin: Negative.   Neurological: Negative.   Endo/Heme/Allergies: Negative.   Psychiatric/Behavioral: Negative.     Blood pressure 130/86, pulse 90, temperature 99.2 F (37.3 C),  temperature source Oral, resp. rate 18, height 5\' 6"  (1.676 m), weight 96.2 kg (212 lb), SpO2 100 %.Body mass index is 34.22 kg/m.  General Appearance: Fairly Groomed  Eye Contact:  Minimal  Speech:  Slow  Volume:  Decreased  Mood:  Euthymic  Affect:  Constricted  Thought Process:  Linear and Descriptions of Associations: Intact   Orientation:  Full (Time, Place, and Person)  Thought Content:  Hallucinations: None   Suicidal  Thoughts:  No  Homicidal Thoughts:  No  Memory:  Immediate;   Fair Recent;   Fair Remote;   Fair  Judgement:  Fair  Insight:  Fair  Psychomotor Activity:  Decreased  Concentration:  Concentration: Fair and Attention Span: Fair  Recall:  [employer] of Knowledge:  Fair  Language:  Good  Akathisia:  No  Handed:    AIMS (if indicated):     Assets:  Communication Skills Social Support  ADL's:  Intact  Cognition:  WNL  Sleep:  Number of Hours: 6.45     Treatment Plan Summary:  Patient is a 36 year old African-American male with schizophrenia. He has a long history of poor compliance with medication and substance abuse.  Patient presented to our emergency department due to disorganized and bizarre behavior likely in the setting of noncompliance.  Patient was able to tell me today that he misses his long-acting injectable antipsychotic at [facility] for 2 months. He then jump on her son. Patient instead of being taken to the police was taken to the emergency department. Per his mother he is not to be near his son at this time. Patient's mother is taking care of the patient's children.  Schizophrenia: Continue Invega 12 mg by mouth daily at bedtime.  Received Invega injectable 234 mg on 4/9.  He will receive 156 IM on 4/12  For insomnia: Continue Ativan 2 mg daily at bedtime  Cannabis use disorder, alcohol use disorder: Consider referral to intensive outpatient substance abuse treatment upon discharge.  For hypertension:continue Norvasc 10 mg daily and  Lotensin 40 mg a day  Diabetes continue metformin 500 mg twice a day  For gout continue: Continue Allopurinol. Indocin  only when necessary  For tobacco use disorder: continue  nicotine patch 21 mg  Diet low sodium and carbohydrate modified  Precautions every 15 minute checks  Vital signs daily  Hospitalization status involuntary commitment--- Court hearing  for involuntary commitment on 4/9. Patient declined to go.  Labs I will order hemoglobin A1c, lipid panel and TSH--- hemoglobin A1c 6.5. TSH within the normal limits. Cholesterol shows decreased HDL.  Disposition: Social worker has contacted the patient's landlord. He has not been evicted. Patient is to pay his rent which he has not pain 2 months. Patient stated that he has the money to pay for his rent and wishes to return to his apartment.  Follow-up patient will continue to follow-up with [facility].--- Considering ACT referral  Will likely discharge on Thursday after he received Gean Birchwood 156mg .  Jimmy Footman, MD 02/24/2017, 4:22 PM

## 2017-02-24 NOTE — Progress Notes (Signed)
Pt continues to be bright on approach. Denies SI, HI, AVH. Pt talking with mother in reference to paying his rent. Denies SI, HI, AVH. No negative behaviors. Medication and group compliant.  Encouragement and support offered. Safety checks maintained. Pt receptive and remains safe on unit with q  Min checks.

## 2017-02-24 NOTE — Progress Notes (Signed)
Patient ID: Walter Ehlers., male   DOB: [birth year], 36 y.o.   MRN: [id number] Improved appearance, decrease body odor, Invega 234 mg IM Loading Dose given to the left deltoid process, education about medication provided, questions encouraged; patient continues to have bright affect, happy, interacting well with peers and staffs, no odd bizarre behavior; anticipating discharge.

## 2017-02-24 NOTE — Progress Notes (Signed)
Recreation Therapy Notes  Date: 04.10.18 Time: 9:30 am Location: Craft Room  Group Topic: Coping Skills  Goal Area(s) Addresses:  Patient will write healthy coping skills. Patient will verbalize ways to use healthy coping skills.  Behavioral Response: Attentive  Intervention: Coping Skills Alphabet  Activity: Patients were given a Naval architect worksheet and were instructed to write healthy coping skills for each letter of the alphabet.  Education: LRT educated patients on healthy coping skills.  Education Outcome: In group clarification offered   Clinical Observations/Feedback: Patient arrived to group at approximately 9:55 am. LRT provided patient with worksheet. Patient was focused on a document he had. Patient did not work on worksheet or contribute to group discussion.  Jacquelynn Cree, LRT/CTRS 02/24/2017 10:14 AM

## 2017-02-24 NOTE — [facility] Group Notes (Signed)
[facility] Group Notes:  (Nursing/MHT/Case Management/Adjunct)  Date:  02/24/2017  Time:  9:34 PM  Type of Therapy:  Psychoeducational Skills  Participation Level:  Active  Participation Quality:  Appropriate  Affect:  Appropriate  Cognitive:  Alert  Insight:  Good  Engagement in Group:  Supportive  Modes of Intervention:  Support  Summary of Progress/Problems:  Walter Pena 02/24/2017, 9:34 PM

## 2017-02-24 NOTE — Plan of Care (Signed)
Problem: Activity: Goal: Sleeping patterns will improve Outcome: Progressing Patient slept for Estimated Hours of 6.45; every 15 minutes safety round maintained, no injury or falls during this shift.    

## 2017-02-25 MED ORDER — TRAZODONE HCL 50 MG PO TABS
150.0000 mg | ORAL_TABLET | Freq: Every day | ORAL | Status: DC
  Administered 2017-02-25: 23:00:00 150 mg via ORAL
  Filled 2017-02-25: qty 1

## 2017-02-25 MED ORDER — AMANTADINE HCL 100 MG PO CAPS: 100.0000 mg | ORAL_CAPSULE | Freq: Two times a day (BID) | ORAL | 0 refills | Status: DC

## 2017-02-25 MED ORDER — PALIPERIDONE ER 6 MG PO TB24: 12.0000 mg | ORAL_TABLET | Freq: Every day | ORAL | 0 refills | Status: DC

## 2017-02-25 MED ORDER — PALIPERIDONE PALMITATE 234 MG/1.5ML IM SUSP: 234.0000 mg | Freq: Once | INTRAMUSCULAR | 0 refills | Status: DC

## 2017-02-25 MED ORDER — AMANTADINE HCL 100 MG PO CAPS
100.0000 mg | ORAL_CAPSULE | Freq: Two times a day (BID) | ORAL | Status: DC
  Administered 2017-02-25 – 2017-02-26 (×3): 100 mg via ORAL
  Filled 2017-02-25 (×3): qty 1

## 2017-02-25 MED ORDER — TRAZODONE HCL 100 MG PO TABS: 100.0000 mg | ORAL_TABLET | Freq: Every day | ORAL | Status: DC

## 2017-02-25 MED ORDER — PALIPERIDONE PALMITATE 156 MG/ML IM SUSP
156.0000 mg | Freq: Once | INTRAMUSCULAR | Status: AC
  Administered 2017-02-26: 156 mg via INTRAMUSCULAR
  Filled 2017-02-25: qty 1

## 2017-02-25 NOTE — Progress Notes (Signed)
[facility] MD Progress Note  02/25/2017 1:24 PM Neta Ehlers.  MRN:  [id number] Subjective: Patient is a 36 year old single African-American male from [facility] who carries a diagnosis of schizophrenia.  Patient was brought in by police to our emergency department on March 31 after he was found wandering in his apartment complex barefoot and flashing his genitals.  Patient eloped from the emergency department. Later on he was brought back by police after he was placed on involuntary commitment.  Patient told the ER physician that he was at a football game. Per collateral information obtained from his mother: "Pt lives independently but has not paid his bills and now his lights are turned off and he has received eviction notice. Pt was found walking in the middle of the street today and when he starts to decompensate he begins to walk all over the place and also starts to get angry a lot. This is how pt typically acts when he is not taking his medicine and this has happened a number of times before. Pt history includes several group homes and several admits to [facility]. Mother has also smelled marijuana in pt's apartment recently and believes he is smoking again."  02/19/2017 Less thought blocking. Pt still does not appear to have insight as to why he is here. Say he does not feel the medications have kicked in yet. Finally slept for 5 hours last night which is the most he has slept in several days. complains of poor appetite. Denies suicidality, homicidality or hallucinations. Denies having side effects from medications. As far as physical complains, says pains from gout is less severe.  02/20/17 patient tells me he wasn't able to sleep at all last night. He kept falling asleep during assessment and had great difficulty following my questions and answering them. He continues to deny having any psychiatric symptoms. He seen pacing around and is staring at people. Nurses report he was  paranoid about medications yesterday.  Follow-up for Saturday the seventh. Patient seen chart reviewed. Patient is taking care of his hygiene and is neatly dressed. He goes to meals but otherwise is mostly staying isolated. He seems a little guarded and paranoid in the interview but that is very normal for him. Overall seems like he might be getting a little bit better. No complaints about his medicine. No other medical complaints.  Follow-up Sunday the eighth. Patient has no new complaints. He is neatly dressed and groomed. Taking care of his hygiene better. Affect flat age but speech is normal in rate and tone and he is not frankly bizarre or disorganized and is showing improved insight.  02/23/17 patient reports doing well. He does not have major issues or concerns. His thought process is much more linear and organized. He still has no insight but is less guarded and suspicious. No evidence of thought blocking today. He has been is sleeping much better this weekend. He says he is tolerating medications well. He is open to injectable medication today. Denies any suicidality, homicidality or auditory or visual hallucinations. He denies any problems with sleep appetite, energy or concentration. Denies physical complaints or side effects.  02/24/17 patient reports doing well. He denies having any issues or concerns. He received Invega 234 mg on Monday. He reports he isn't sleeping much better. He continues to deny having any auditory or visual hallucinations or any problems with mood, appetite, energy or concentration. The patient has been attending some groups. His thought process, hygiene and ability to express  himself has significantly improved.  4/11 patient reports doing very well. He denies having any problems or concerns today. He denies physical complaints or side effects from medications. He denies problems with mood, appetite, energy, and sleep or concentration. He has been pleasant and cooperative.  His thought process is linear and organized. He does not appear to be delusional or suspicious. There is no evidence of thought blocking or any disorganization in his thought processes. Good hygiene and grooming. Has been compliant with medications.  Pt scheduled to receive Gean Birchwood second injection tomorrow  Per nursing: D: Pt denies SI/HI/AVH. Pt is cooperative,affect is flat but brightens upon approach. Patient appears less anxious and he is interacting with peers and staff appropriately.  A: Pt was offered support and encouragement. Pt was given scheduled medications. Pt was encouraged to attend groups. Q 15 minute checks were done for safety.  R:Pt attends groups and interacts well with peers and staff. Pt is taking medication. Pt has no complaints.Pt receptive to treatment and safety maintained on unit   Principal Problem: Schizophrenia [facility]) Diagnosis:   Patient Active Problem List   Diagnosis Date Noted  . Diabetes (HCC) [E11.9] 02/18/2017  . Schizophrenia (HCC) [F20.9] 02/17/2017  . Alcohol use disorder (HCC) [F10.99] 02/17/2017  . Noncompliance [Z91.19] 05/07/2016  . HTN (hypertension) [I10] 01/21/2016  . Cannabis use disorder, moderate, dependence (HCC) [F12.20] 01/21/2016  . Tobacco use disorder [F17.200] 01/21/2016  . Gout [M10.9] 01/21/2016  . Hidradenitis [L73.2] 01/21/2016   Total Time spent with patient: 30 minutes  Past Psychiatric History: Patient reports being diagnosed with schizophrenia in 1995. States that he has been hospitalized multiple times since the age of 72. He denies any history of self injury or suicidal attempts.  Past Medical History:  Past Medical History:  Diagnosis Date  . Gout   . Hidradenitis   . Hypertension   . Schizophrenia [facility])     Past Surgical History:  Procedure Laterality Date  . sweat gland removal     Family History:  Family History  Problem Relation Age of Onset  . Diabetes Mellitus II Mother   . Hypertension Mother     Family Psychiatric  History: Patient denies having any family history of suicide, mental illness or substance abuse.  Social History: Patient lives by himself in an apartment. He is disabled due to mental illness. He is single, never married. Doesn't have any children. He denies having any legal history. As far as his education he dropped out of the 11th grade History  Alcohol Use  . Yes     History  Drug Use  . Types: Marijuana    Social History   Social History  . Marital status: Single    Spouse name: N/A  . Number of children: N/A  . Years of education: N/A   Social History Main Topics  . Smoking status: Current Every Day Smoker    Packs/day: 1.00    Years: 9.00    Types: Cigarettes  . Smokeless tobacco: Never Used  . Alcohol use Yes  . Drug use: Yes    Types: Marijuana  . Sexual activity: Not Asked   Other Topics Concern  . None   Social History Narrative  . None     Current Medications: Current Facility-Administered Medications  Medication Dose Route Frequency Provider Last Rate Last Dose  . acetaminophen (TYLENOL) tablet 650 mg  650 mg Oral Q6H PRN Audery Amel, MD   650 mg at 02/25/17 0849  .  allopurinol (ZYLOPRIM) tablet 100 mg  100 mg Oral Daily Jimmy Footman, MD   100 mg at 02/25/17 0847  . alum & mag hydroxide-simeth (MAALOX/MYLANTA) 200-200-20 MG/5ML suspension 30 mL  30 mL Oral Q4H PRN Audery Amel, MD      . amantadine (SYMMETREL) capsule 100 mg  100 mg Oral BID Jimmy Footman, MD   100 mg at 02/25/17 1103  . amLODipine (NORVASC) tablet 10 mg  10 mg Oral Daily Jimmy Footman, MD   10 mg at 02/25/17 0847  . benazepril (LOTENSIN) tablet 40 mg  40 mg Oral Daily Jimmy Footman, MD   40 mg at 02/25/17 0847  . magnesium hydroxide (MILK OF MAGNESIA) suspension 30 mL  30 mL Oral Daily PRN Audery Amel, MD      . nicotine (NICODERM CQ - dosed in mg/24 hours) patch 21 mg  21 mg Transdermal Daily Jimmy Footman, MD   21 mg at 02/24/17 0811  . [START ON 02/26/2017] paliperidone (INVEGA SUSTENNA) injection 156 mg  156 mg Intramuscular Once Jimmy Footman, MD      . paliperidone (INVEGA) 24 hr tablet 12 mg  12 mg Oral QHS Jimmy Footman, MD   12 mg at 02/24/17 2139  . traZODone (DESYREL) tablet 150 mg  150 mg Oral QHS Jimmy Footman, MD        Lab Results:  No results found for this or any previous visit (from the past 48 hour(s)).  Blood Alcohol level:  Lab Results  Component Value Date   ETH <5 02/14/2017   ETH <5 05/07/2016    Metabolic Disorder Labs: Lab Results  Component Value Date   HGBA1C 6.5 (H) 02/17/2017   MPG 140 02/17/2017   Lab Results  Component Value Date   PROLACTIN 27.2 (H) 05/08/2016   PROLACTIN 6.9 01/19/2016   Lab Results  Component Value Date   CHOL 118 02/17/2017   TRIG 117 02/17/2017   HDL 25 (L) 02/17/2017   CHOLHDL 4.7 02/17/2017   VLDL 23 02/17/2017   LDLCALC 70 02/17/2017   LDLCALC 68 05/08/2016    Physical Findings: AIMS:  , ,  ,  ,    CIWA:    COWS:     Musculoskeletal: Strength & Muscle Tone: within normal limits Gait & Station: normal Patient leans: N/A  Psychiatric Specialty Exam: Physical Exam  Nursing note and vitals reviewed. Constitutional: He is oriented to person, place, and time. He appears well-developed and well-nourished.  HENT:  Head: Normocephalic and atraumatic.  Eyes: Conjunctivae and EOM are normal. Pupils are equal, round, and reactive to light.  Neck: Normal range of motion.  Cardiovascular: Regular rhythm and normal heart sounds.   Respiratory: Effort normal. No respiratory distress.  GI: Soft.  Musculoskeletal: Normal range of motion.  Neurological: He is alert and oriented to person, place, and time.  Skin: Skin is warm and dry.     Psychiatric: Judgment normal. Thought content is not paranoid. Cognition and memory are normal. He expresses no homicidal and no  suicidal ideation.    Review of Systems  Constitutional: Negative.   HENT: Negative.   Eyes: Negative.   Respiratory: Negative.   Cardiovascular: Negative.   Gastrointestinal: Negative.   Genitourinary: Negative.   Musculoskeletal: Negative.   Skin: Negative.   Neurological: Negative.   Endo/Heme/Allergies: Negative.   Psychiatric/Behavioral: Negative.    Blood pressure 139/85, pulse 90, temperature 98.6 F (37 C), temperature source Oral, resp. rate 16, height 5\' 6"  (1.676 m), weight  96.2 kg (212 lb), SpO2 100 %.Body mass index is 34.22 kg/m.  General Appearance: Fairly Groomed  Eye Contact:  Minimal  Speech:  Slow  Volume:  Decreased  Mood:  Euthymic  Affect:  Constricted  Thought Process:  Linear and Descriptions of Associations: Intact   Orientation:  Full (Time, Place, and Person)  Thought Content:  Hallucinations: None   Suicidal Thoughts:  No  Homicidal Thoughts:  No  Memory:  Immediate;   Fair Recent;   Fair Remote;   Fair  Judgement:  Fair  Insight:  Fair  Psychomotor Activity:  Normal  Concentration:  Concentration: Fair and Attention Span: Fair  Recall:  [employer] of Knowledge:  Fair  Language:  Good  Akathisia:  No  Handed:    AIMS (if indicated):     Assets:  Communication Skills Social Support  ADL's:  Intact  Cognition:  WNL  Sleep:  Number of Hours: 6.5     Treatment Plan Summary:  Patient is a 36 year old African-American male with schizophrenia. He has a long history of poor compliance with medication and substance abuse.  Patient presented to our emergency department due to disorganized and bizarre behavior likely in the setting of noncompliance.  Patient was able to tell me today that he misses his long-acting injectable antipsychotic at [facility] ([place] for 2 months. He then jump on her son. Patient instead of being taken to the police was taken to the emergency department. Per his mother he is not to be near his son at this time. Patient's mother is  taking care of the patient's children.  Schizophrenia: Continue Invega 12 mg by mouth daily at bedtime.  Received Invega injectable 234 mg on 4/9.  He will receive 156 IM on 4/12 (tomorrow)  EPS: will start amantadine 100 mg po bid  For insomnia: will start trazodone 100 mg qhs  Cannabis use disorder, alcohol use disorder: Consider referral to intensive outpatient substance abuse treatment upon discharge.  For hypertension:continue Norvasc 10 mg daily and Lotensin 40 mg a day  Diabetes continue metformin 500 mg twice a day  For gout continue: Continue Allopurinol   For tobacco use disorder: continue  nicotine patch 21 mg  Diet low sodium and carbohydrate modified  Precautions every 15 minute checks  Vital signs daily  Hospitalization status involuntary commitment--- Court hearing  for involuntary commitment on 4/9. Patient declined to go.  Labs I will order hemoglobin A1c, lipid panel and TSH--- hemoglobin A1c 6.5. TSH within the normal limits. Cholesterol shows decreased HDL.  Disposition: Social worker has contacted the patient's landlord. He has not been evicted. Patient is to pay his rent which he has not pain 2 months. Patient stated that he has the money to pay for his rent and wishes to return to his apartment.  Follow-up patient will continue to follow-up with [facility].--- Considering ACT referral  Will be discharge on Thursday after he received Invega Sustenna 156mg .  Jimmy Footman, MD 02/25/2017, 1:24 PM

## 2017-02-25 NOTE — Progress Notes (Signed)
Pt awake, alert, oriented and up on unit today. Acts silly at times, laughing. Denies SI/HI/AVH, pain. Appropriately interacts with staff/peers. Socializes in dayroom. Cooperative and pleasant. Medication compliant. Logical/Coherent thoughts. Less anxious. Attends some groups, but is usually late. Support and encouragement provided. Medications administered as ordered with education. safety maintained with every 15 minute checks. Will continue to monitor.

## 2017-02-25 NOTE — Progress Notes (Signed)
D: Pt denies SI/HI/AVH. Pt is cooperative,affect is flat but brightens upon approach. Patient appears less anxious and he is interacting with peers and staff appropriately.  A: Pt was offered support and encouragement. Pt was given scheduled medications. Pt was encouraged to attend groups. Q 15 minute checks were done for safety.  R:Pt attends groups and interacts well with peers and staff. Pt is taking medication. Pt has no complaints.Pt receptive to treatment and safety maintained on unit.

## 2017-02-25 NOTE — Discharge Summary (Signed)
Physician Discharge Summary Note  Patient:  Walter Pena. is an 36 y.o., male MRN:  [id number] DOB:  10/07/1981 Patient phone:  [phone] (home)  Patient address:   [address] [place] [address],  Total Time spent with patient: 30 minutes  Date of Admission:  02/17/2017 Date of Discharge: 02/26/17  Reason for Admission:  psychosis  Principal Problem: Schizophrenia [facility]) Discharge Diagnoses: Patient Active Problem List   Diagnosis Date Noted  . Diabetes ([place]) [E11.9] 02/18/2017  . Schizophrenia ([place]) [F20.9] 02/17/2017  . Alcohol use disorder ([place]) [F10.99] 02/17/2017  . Noncompliance [Z91.19] 05/07/2016  . HTN (hypertension) [I10] 01/21/2016  . Cannabis use disorder, moderate, dependence ([place]) [F12.20] 01/21/2016  . Tobacco use disorder [F17.200] 01/21/2016  . Gout [M10.9] 01/21/2016  . Hidradenitis [L73.2] 01/21/2016    History of Present Illness:   Patient is a 36 year old single African-American male from [place] who carries a diagnosis of schizophrenia.  Patient was brought in by police to our emergency department on March 31 after he was found wandering in his apartment complex barefoot and flashing his genitals.  Patient eloped from the emergency department. Later on he was brought back by police after he was placed on involuntary commitment.  Patient told the ER physician that he was at a football game. Per collateral information obtained from his mother: "Pt lives independently but has not paid his bills and now his lights are turned off and he has received eviction notice. Pt was found walking in the middle of the street today and when he starts to decompensate he begins to walk all over the place and also starts to get angry a lot. This is how pt typically acts when he is not taking his medicine and this has happened a number of times before. Pt history includes several group homes and several admits to [facility]. Mother has also smelled marijuana  in pt's apartment recently and believes he is smoking again."  Today during assessment the patient displayed some episodes of thought blocking. At times and of his answers were completely unrelated to the questions. He tells me he has been diagnosed with schizophrenia and receives disability. He follows up with Dr. Mathews Argyle at [facility]. Patient is unable to remember the name of the medications he is prescribed with for schizophrenia. He claims that he has been taking medications daily as prescribed.  Patient is unable to tell me what brought him to the hospital. He does not feel he needs to be.  Today the patient denies having any issues with depression, problems with sleep appetite, energy or concentration. He denies suicidality, homicidality or auditory or visual hallucinations.  He reports drinking half a gallon of liquor per day. However he is not withdrawing and his toxicology screen was negative for alcohol. AST is only minimally elevated at 50. Patient says he has been smoking marijuana daily. He smokes about one pack of cigarettes per day.  Trauma this was not addressed during this assessment as the patient was a limited historian and displayed thought blocking  Per records his last hospitalization in our unit was in 2017. He was discharged on Haldol oral and Haldol decanoate.   Associated Signs/Symptoms: Depression Symptoms:  denies (Hypo) Manic Symptoms:  denies Anxiety Symptoms:  denies Psychotic Symptoms:  disorganized thought process PTSD Symptoms: NA Total Time spent with patient: 1 hour  Past Psychiatric History: Patient reports being diagnosed with schizophrenia in 1995. States that he has been hospitalized multiple times since the age of  15. He denies any history of self injury or suicidal attempts.   Past Medical History:  Past Medical History:  Diagnosis Date  . Gout   . Hidradenitis   . Hypertension   . Schizophrenia [facility])     Past Surgical History:  Procedure  Laterality Date  . sweat gland removal     Family History:  Family History  Problem Relation Age of Onset  . Diabetes Mellitus II Mother   . Hypertension Mother    Family Psychiatric  History: Patient denies having any family history of suicide, mental illness or substance abuse.  Social History: Patient lives by himself in an apartment. He is disabled due to mental illness. He is single, never married. Doesn't have any children. He denies having any legal history. As far as his education he dropped out of the 11th grade  History  Alcohol Use  . Yes     History  Drug Use  . Types: Marijuana    Social History   Social History  . Marital status: Single    Spouse name: N/A  . Number of children: N/A  . Years of education: N/A   Social History Main Topics  . Smoking status: Current Every Day Smoker    Packs/day: 1.00    Years: 9.00    Types: Cigarettes  . Smokeless tobacco: Never Used  . Alcohol use Yes  . Drug use: Yes    Types: Marijuana  . Sexual activity: Not Asked   Other Topics Concern  . None   Social History Narrative  . None    Hospital Course:    Patient is a 36 year old African-American male with schizophrenia. He has a long history of poor compliance with medication and substance abuse.  Patient presented to our emergency department due to disorganized and bizarre behavior likely in the setting of noncompliance.  Patient was able to tell me today that he misses his long-acting injectable antipsychotic at [facility] for 2 months. He then jump on her son. Patient instead of being taken to the police was taken to the emergency department. Per his mother he is not to be near his son at this time. Patient's mother is taking care of the patient's children.  Schizophrenia: Continue Invega 12 mg by mouth daily at bedtime.  Received Invega injectable 234 mg on 4/9.  Pt received invega 156 IM on 4/12  OIN:OMVEHMCN amantadine 100 mg po bid  For insomnia:  continuet trazodone 100 mg qhs  Cannabis use disorder, alcohol use disorder: Consider referral to intensive outpatient substance abuse treatment upon discharge.  For hypertension:continue Norvasc 10 mg daily and Lotensin 40 mg a day  Diabetes continue metformin 500 mg twice a day  For gout continue: Continue Allopurinol   For tobacco use disorder:received nicotine patch 21 mg  Diet low sodium and carbohydrate modified  Hospitalization status involuntary commitment--- Court hearing  for involuntary commitment on 4/9. Patient declined to go.  Labs:  hemoglobin A1c 6.5. TSH within the normal limits. Cholesterol shows decreased HDL.  Disposition: will return to live with his mother  Follow-up patient will continue to follow-up with [facility].---  ACT referral made to [facility]  The patient will be discharged back to family today. He is significantly improved he is no longer displaying suspiciousness or paranoia. His thought process is linear and goal directed. His hygiene and grooming is appropriate. He is interacting appropriately with peers, staff, his attending groups and participating. He has not displayed any aggression or agitation. He  did not require any seclusion, restraints or forced medications.  Did very well to [place]. He denies any side effects or any physical complaints. Patient has tolerated the medications quite well. Today he denies suicidality, homicidality or auditory or visual hallucinations. His mood is described as euthymic and his affect is bright and reactive.  He has been contacted the do not have any concerns about his safety or the safety of others upon discharge. Staff working with the patient feels he is much improved and they do not have any concerns about his safety or the safety of others if he were discharged today.  Patient will be discharged to follow up with [facility]. We made a referral for [facility]. They have an IPRS spot that will open up in a  couple weeks  Denies access to guns  Physical Findings: AIMS:  , ,  ,  ,    CIWA:    COWS:     Musculoskeletal: Strength & Muscle Tone: within normal limits Gait & Station: normal Patient leans: N/A  Psychiatric Specialty Exam: Physical Exam  Constitutional: He is oriented to person, place, and time. He appears well-developed and well-nourished.  HENT:  Head: Normocephalic and atraumatic.  Eyes: Conjunctivae and EOM are normal.  Neck: Normal range of motion.  Respiratory: Effort normal.  Musculoskeletal: Normal range of motion.  Neurological: He is alert and oriented to person, place, and time.    Review of Systems  Constitutional: Negative.   HENT: Negative.   Eyes: Negative.   Respiratory: Negative.   Cardiovascular: Negative.   Gastrointestinal: Negative.   Genitourinary: Negative.   Musculoskeletal: Negative.   Skin: Negative.   Neurological: Negative.   Endo/Heme/Allergies: Negative.   Psychiatric/Behavioral: Negative.     Blood pressure 130/89, pulse 77, temperature 97.8 F (36.6 C), temperature source Oral, resp. rate 18, height _0  (1.676 m), weight 96.2 kg (212 lb), SpO2 100 %.Body mass index is 34.22 kg/m.  General Appearance: Well Groomed  Eye Contact:  Good  Speech:  Clear and Coherent  Volume:  Normal  Mood:  Euthymic  Affect:  Appropriate  Thought Process:  Linear and Descriptions of Associations: Intact  Orientation:  Full (Time, Place, and Person)  Thought Content:  Hallucinations: None  Suicidal Thoughts:  No  Homicidal Thoughts:  No  Memory:  Immediate;   Good Recent;   Good Remote;   Good  Judgement:  Fair  Insight:  Fair  Psychomotor Activity:  Normal  Concentration:  Concentration: Good and Attention Span: Good  Recall:  Good  Fund of Knowledge:  Good  Language:  Good  Akathisia:  No  Handed:    AIMS (if indicated):     Assets:  Communication Skills Social Support  ADL's:  Intact  Cognition:  WNL  Sleep:  Number of Hours:  7.75     Have you used any form of tobacco in the last 30 days? (Cigarettes, Smokeless Tobacco, Cigars, and/or Pipes): Yes  Has this patient used any form of tobacco in the last 30 days? (Cigarettes, Smokeless Tobacco, Cigars, and/or Pipes) Yes, Yes, A prescription for an FDA-approved tobacco cessation medication was offered at discharge and the patient refused  Blood Alcohol level:  Lab Results  Component Value Date   [facility] <5 02/14/2017   ETH <5 56/43/3295    Metabolic Disorder Labs:  Lab Results  Component Value Date   HGBA1C 6.5 (H) 02/17/2017   MPG 140 02/17/2017   Lab Results  Component Value Date  PROLACTIN 27.2 (H) 05/08/2016   PROLACTIN 6.9 01/19/2016   Lab Results  Component Value Date   CHOL 118 02/17/2017   TRIG 117 02/17/2017   HDL 25 (L) 02/17/2017   CHOLHDL 4.7 02/17/2017   VLDL 23 02/17/2017   LDLCALC 70 02/17/2017   LDLCALC 68 05/08/2016   Results for JERMANY, RIMEL (MRN [id number]) as of 02/25/2017 14:01  Ref. Range 02/14/2017 13:10 02/14/2017 17:52 02/14/2017 18:13 02/14/2017 20:20 02/17/2017 06:10 02/17/2017 06:11 02/17/2017 06:45  Glucose Latest Ref Range: 65 - 99 mg/dL 147 (H)        Mean Plasma Glucose Latest Units: mg/dL       140  BUN Latest Ref Range: 6 - 20 mg/dL 27 (H)        Creatinine Latest Ref Range: 0.61 - 1.24 mg/dL 1.47 (H)        Calcium Latest Ref Range: 8.9 - 10.3 mg/dL 9.4        Anion gap Latest Ref Range: 5 - 15  10        Alkaline Phosphatase Latest Ref Range: 38 - 126 U/L 78        Albumin Latest Ref Range: 3.5 - 5.0 g/dL 4.0        AST Latest Ref Range: 15 - 41 U/L 50 (H)        ALT Latest Ref Range: 17 - 63 U/L 50        Total Protein Latest Ref Range: 6.5 - 8.1 g/dL 9.9 (H)        Total Bilirubin Latest Ref Range: 0.3 - 1.2 mg/dL 1.2        EGFR (African American) Latest Ref Range: >60 mL/min >60        EGFR (Non-African Amer.) Latest Ref Range: >60 mL/min 60 (L)        CK, MB Latest Ref Range: 0.5 - 5.0 ng/mL       5.6 (H)   Troponin I Latest Ref Range: <0.03 ng/mL       <0.03  Total CHOL/HDL Ratio Latest Units: RATIO       4.7  Cholesterol Latest Ref Range: 0 - 200 mg/dL       118  HDL Cholesterol Latest Ref Range: >40 mg/dL       25 (L)  LDL (calc) Latest Ref Range: 0 - 99 mg/dL       70  Triglycerides Latest Ref Range: <150 mg/dL       117  VLDL Latest Ref Range: 0 - 40 mg/dL       23  WBC Latest Ref Range: 3.8 - 10.6 K/uL 14.0 (H)        RBC Latest Ref Range: 4.40 - 5.90 MIL/uL 5.20        Hemoglobin Latest Ref Range: 13.0 - 18.0 g/dL 15.0        HCT Latest Ref Range: 40.0 - 52.0 % 44.7        MCV Latest Ref Range: 80.0 - 100.0 fL 85.9        MCH Latest Ref Range: 26.0 - 34.0 pg 28.9        MCHC Latest Ref Range: 32.0 - 36.0 g/dL 33.6        RDW Latest Ref Range: 11.5 - 14.5 % 15.0 (H)        Platelets Latest Ref Range: 150 - 440 K/uL 319        Acetaminophen (Tylenol), S Latest Ref Range: 10 - 30 ug/mL  <  10 (L)       Salicylate Lvl Latest Ref Range: 2.8 - 30.0 mg/dL  <7.0       Hemoglobin A1C Latest Ref Range: 4.8 - 5.6 %       6.5 (H)  TSH Latest Ref Range: 0.350 - 4.500 uIU/mL       0.980  Alcohol, Ethyl (B) Latest Ref Range: <5 mg/dL  <5       Amphetamines, Ur Screen Latest Ref Range: NONE DETECTED     NONE DETECTED     Barbiturates, Ur Screen Latest Ref Range: NONE DETECTED     NONE DETECTED     Benzodiazepine, Ur Scrn Latest Ref Range: NONE DETECTED     POSITIVE (A)     Cocaine Metabolite,Ur [place] Latest Ref Range: NONE DETECTED     NONE DETECTED     Methadone Scn, Ur Latest Ref Range: NONE DETECTED     NONE DETECTED     MDMA (Ecstasy)Ur Screen Latest Ref Range: NONE DETECTED     NONE DETECTED     Cannabinoid 50 Ng, Ur [place] Latest Ref Range: NONE DETECTED     POSITIVE (A)     Opiate, Ur Screen Latest Ref Range: NONE DETECTED     NONE DETECTED     Phencyclidine (PCP) Ur S Latest Ref Range: NONE DETECTED     NONE DETECTED     Tricyclic, Ur Screen Latest Ref Range: NONE DETECTED     NONE DETECTED      CT HEAD WO CONTRAST Unknown   Rpt      EKG 12-LEAD Unknown     Rpt Rpt    See Psychiatric Specialty Exam and Suicide Risk Assessment completed by Attending Physician prior to discharge.  Discharge destination:  Home  Is patient on multiple antipsychotic therapies at discharge:  Yes,   Do you recommend tapering to monotherapy for antipsychotics?  Yes   Has Patient had three or more failed trials of antipsychotic monotherapy by history:  No  Recommended Plan for Multiple Antipsychotic Therapies: Taper to monotherapy as described:  gradually decrease oral invega   Allergies as of 02/26/2017      Reactions   Bactrim [sulfamethoxazole-trimethoprim] Nausea And Vomiting   Lisinopril Swelling   Other Rash   aloe      Medication List    STOP taking these medications   benztropine 1 MG tablet Commonly known as:  COGENTIN     TAKE these medications     Indication  allopurinol 100 MG tablet Commonly known as:  ZYLOPRIM Take 100 mg by mouth daily.  Indication:  gout   amantadine 100 MG capsule Commonly known as:  SYMMETREL Take 1 capsule (100 mg total) by mouth 2 (two) times daily.  Indication:  Extrapyramidal Reaction caused by Medications   amLODipine 10 MG tablet Commonly known as:  NORVASC Take 10 mg by mouth daily.  Indication:  High Blood Pressure Disorder   benazepril 40 MG tablet Commonly known as:  LOTENSIN Take 1 tablet by mouth daily.  Indication:  High Blood Pressure Disorder   colchicine 0.6 MG tablet 1 tablet by mouth daily until gout pain is gone.  Indication:  Gout   paliperidone 234 MG/1.5ML Susp injection Commonly known as:  INVEGA SUSTENNA Inject 234 mg into the muscle once. Due May 7 Start taking on:  03/23/2017  Indication:  Schizophrenia   paliperidone 6 MG 24 hr tablet Commonly known as:  INVEGA Take 2 tablets (12 mg total) by mouth  at bedtime.  Indication:  Schizophrenia   traZODone 150 MG tablet Commonly known as:  DESYREL Take 1 tablet  (150 mg total) by mouth at bedtime.  Indication:  [place]. Go on 03/02/2017.   Why:  Please meet Sherrian Divers at [facility] on 03/02/17 at 7:00am for your hospital follow up appointment.  Please bring photo ID and a copy of your hospital discharge paperwork.   Contact information: [place] [address] [place] Follow up.   Why:  [facility] will contact you when they have an available slot on their ACT team and initiate services at that time.  Thayer Headings is the [place] contact person. Please call with any questions at the above number. Contact information: [address] [place] [address] [phone]           >30 minutes. >50 % of the time was spent in coordination of care  Signed: Hildred Priest, MD 02/26/2017, 9:54 AM

## 2017-02-25 NOTE — Progress Notes (Signed)
Recreation Therapy Notes  Date: 04.11.18 Time: 9:30 am Location: Craft Room  Group Topic: Self-esteem  Goal Area(s) Addresses:  Patient will identify at least one positive trait about self. Patient will identify at least one healthy coping skill.  Behavioral Response: Arrived late, Attentive  Intervention: All About Me  Activity: Patients were instructed to make an All About Me pamphlet including their life's motto, positive traits, healthy coping skills, and their support system.  Education: LRT educated patients on ways they can increase their self-esteem.   Education Outcome: Patient arrived with a few minutes left in group.  Clinical Observations/Feedback: Patient arrived to group at approximately 10:05 am. Patient did not contribute to group discussion.  Jacquelynn Cree, LRT/CTRS 02/25/2017 10:25 AM

## 2017-02-25 NOTE — Progress Notes (Signed)
CSW spoke to [facility] ACT team: they cannot serve client due to [employer], which they cannot accept.  All IPRS slots are full currently. [facility] also reports they cannot accept client with Medicare. [address] [facility] does not have current open slot but they do anticipate one in several weeks and would contact client when slot becomes available. Garner Nash, MSW, LCSW Clinical Social Worker 02/25/2017 8:59 AM

## 2017-02-25 NOTE — Progress Notes (Signed)
Adult Services Patient-Family Contact/Session: late entry note for meeting on 02/24/17 at 0845  Attendees:  Pt, Vernia Buff, pt's mother, Dionne Bucy, pt's aunt, Unk Pinto [facility], CSW  Goal(s):  Discuss discharge plans  Safety Concerns:  none  Narrative:  Pt's mother had initially reported that she was pt's guardian, but the document she brought was a power of attorney.  Pt mother informed pt that she did not want pt to move back into her home.  She requested pt to re-enter a group home, which pt was not willing to do.  Concerns with pt not taking his medication, which led up to his current hospitalization, were discussed.  Pt is agreeable to referral for ACT team services but states he would like to speak to his landlord about stopping eviction proceedings or else go to a boarding house where he has lived before.  After pt left meeting, CSW discussed with pt's mother that she would need to initiate guardianship proceedings if she felt like pt was unable to make good decisions for himself.  Barrier(s):  ACT team available slot.  Interventions:    Recommendation(s):  CSW and pt will contact landlord about options to maintain current apartment.  CSW to make referral to [facility].  Samson Frederic will initiate services upon discharge.  Follow-up Required:  Yes  Explanation:  Above recommendations will be pursued.  Lorri Frederick, LCSW 02/25/2017, 2:46 PM

## 2017-02-25 NOTE — Plan of Care (Signed)
Problem: Education: Goal: Will be free of psychotic symptoms Outcome: Progressing Does not appear to be responding to internal stimuli, denies SI/HI/AVH, logical/coherent thoughts expressed. Improved.

## 2017-02-25 NOTE — [facility] Suicide Risk Assessment (Signed)
[facility] Discharge Suicide Risk Assessment   Principal Problem: Schizophrenia [facility]) Discharge Diagnoses:  Patient Active Problem List   Diagnosis Date Noted  . Diabetes (HCC) [E11.9] 02/18/2017  . Schizophrenia (HCC) [F20.9] 02/17/2017  . Alcohol use disorder (HCC) [F10.99] 02/17/2017  . Noncompliance [Z91.19] 05/07/2016  . HTN (hypertension) [I10] 01/21/2016  . Cannabis use disorder, moderate, dependence (HCC) [F12.20] 01/21/2016  . Tobacco use disorder [F17.200] 01/21/2016  . Gout [M10.9] 01/21/2016  . Hidradenitis [L73.2] 01/21/2016     Psychiatric Specialty Exam: ROS  Blood pressure 139/85, pulse 90, temperature 98.6 F (37 C), temperature source Oral, resp. rate 16, height 5\' 6"  (1.676 m), weight 96.2 kg (212 lb), SpO2 100 %.Body mass index is 34.22 kg/m.                                                       Mental Status Per Nursing Assessment::   On Admission:     Demographic Factors:  Male  Loss Factors: Decrease in vocational status  Historical Factors: history of non compliance  Risk Reduction Factors:   Responsible for children under 40 years of age, Sense of responsibility to family, Living with another person, especially a relative and Positive social support  No access to guns  Continued Clinical Symptoms:  Alcohol/Substance Abuse/Dependencies Schizophrenia:   Less than 11 years old Previous Psychiatric Diagnoses and Treatments  Cognitive Features That Contribute To Risk:  Closed-mindedness    Suicide Risk:  Minimal: No identifiable suicidal ideation.  Patients presenting with no risk factors but with morbid ruminations; may be classified as minimal risk based on the severity of the depressive symptoms  Follow-up Information    [facility]. Go on 03/02/2017.   Why:  Please meet Unk Pinto at [facility] on 03/02/17 at 7:00am for your hospital follow up appointment.  Please bring photo ID and a copy of your hospital  discharge paperwork.   Contact information: [address] [place] [address] [phone] [facility] Follow up.   Why:  [facility] will contact you when they have an available slot on their ACT team and initiate services at that time.  Liborio Nixon is the [employer] ACTT contact person. Please call with any questions at the above number. Contact information: [address] [place] [address] [phone]            Jimmy Footman, MD 02/25/2017, 2:09 PM

## 2017-02-25 NOTE — Plan of Care (Signed)
Problem: [facility] Participation in Recreation Therapeutic Interventions Goal: STG-Patient will demonstrate improved self esteem by identif STG: Self-Esteem - Within 4 treatment sessions, patient will verbalize at least 5 positive affirmation statements in each of 2 treatment sessions to increase self-esteem.  Outcome: Completed/Met Date Met: 02/25/17 Treatment Session 2; Completed 2 out of 2: At approximately 3:15 pm, LRT met with patient in consultation room. Patient verbalized 5 positive affirmation statements. Patient reported it felt "good". LRT encouraged patient to continue saying positive affirmation statements.  Leonette Monarch, LRT/CTRS 04.11.18 4:14 pm Goal: STG-Other Recreation Therapy Goal (Specify) STG: Stress Management - Within 4 treatment sessions, patient will verbalize understanding of the stress management techniques in each of 2 treatment sessions to increase stress management skills.  Outcome: Completed/Met Date Met: 02/25/17 Treatment Session 2; Completed 2 out of 2: At approximately 3:15 pm, LRT met with patient in consultation room. Patient reported he read over the stress management techniques. Patient verbalized understanding. LRT encouraged patient to practice the stress management techniques.  Leonette Monarch, LRT/CTRS 04.11.18 4:15 pm

## 2017-02-25 NOTE — Progress Notes (Signed)
CSW spoke with pt who reports that he and his mother have decided to not try to save his current apartment by catching up on his past due rent.  Plan is now for pt to come back home with his mother and pursue another apartment at a later date.  CSW confirmed this with pt's mother, who will be available to pick him up tomorrow afternoon, as she has MD appt in [place] in AM. Garner Nash, MSW, LCSW Clinical Social Worker 02/25/2017 10:16 AM

## 2017-02-26 MED ORDER — TRAZODONE HCL 150 MG PO TABS: 150.0000 mg | ORAL_TABLET | Freq: Every day | ORAL | 0 refills | Status: DC

## 2017-02-26 NOTE — Progress Notes (Signed)
Recreation Therapy Notes  Date: 04.12.18 Time: 9:30 am Location: Craft Room  Group Topic: Leisure Education  Goal Area(s) Addresses:  Patient will identify things they are grateful for. Patient will identify how being grateful can influence their decision making.  Behavioral Response: Did not attend  Intervention: Grateful Wheel  Activity: Patients were given an I Am Grateful For worksheet and were instructed to write things they are grateful for under each category.   Education: LRT educated patients on leisure.  Education Outcome: Patient did not attend group.  Clinical Observations/Feedback: Patient did not attend group.   Jacquelynn Cree, LRT/CTRS 02/26/2017 10:03 AM

## 2017-02-26 NOTE — Progress Notes (Signed)
Recreation Therapy Notes  INPATIENT RECREATION TR PLAN  Patient Details Name: Walter Pena. MRN: [id number] DOB: [birth year] Today's Date: 02/26/2017  Rec Therapy Plan Is patient appropriate for Therapeutic Recreation?: Yes Treatment times per week: At least once a week TR Treatment/Interventions: 1:1 session, Group participation (Comment) ([place] participation in daily recreational therapy tx)  Discharge Criteria Pt will be discharged from therapy if:: Treatment goals are met, Discharged Treatment plan/goals/alternatives discussed and agreed upon by:: Patient/family  Discharge Summary Short term goals set: See Care Plan Short term goals met: Complete Progress toward goals comments: One-to-one attended Which groups?: Self-esteem, Coping skills, Other (Comment) (Self-expression) One-to-one attended: Self-esteem, stress management Reason goals not met: N/A Therapeutic equipment acquired: None Reason patient discharged from therapy: Discharge from hospital Pt/family agrees with progress & goals achieved: Yes Date patient discharged from therapy: 02/26/17   Leonette Monarch, LRT/CTRS 02/26/2017, 2:26 PM

## 2017-02-26 NOTE — Progress Notes (Signed)
  [facility] Adult Case Management Discharge Plan :  Will you be returning to the same living situation after discharge:  No. Will be staying with mother. At discharge, do you have transportation home?: Yes,  mother Do you have the ability to pay for your medications: Yes,  medicare  Release of information consent forms completed and in the chart;  Patient's signature needed at discharge.  Patient to Follow up at: Follow-up Information    [facility]. Go on 03/02/2017.   Why:  Please meet Unk Pinto at [facility] on 03/02/17 at 7:00am for your hospital follow up appointment.  Please bring photo ID and a copy of your hospital discharge paperwork.   Contact information: [address] [place] [address] [phone] [facility] Follow up.   Why:  [facility] will contact you when they have an available slot on their ACT team and initiate services at that time.  Liborio Nixon is the [employer] ACTT contact person. Please call with any questions at the above number. Contact information: [address] [place] [address] [phone]           Next level of care provider has access to [facility] Link:no  Safety Planning and Suicide Prevention discussed: Yes,  with mother  Have you used any form of tobacco in the last 30 days? (Cigarettes, Smokeless Tobacco, Cigars, and/or Pipes): Yes  Has patient been referred to the Quitline?: Patient refused referral  Patient has been referred for addiction treatment: Yes  Lorri Frederick, LCSW 02/26/2017, 9:48 AM

## 2017-02-26 NOTE — [facility] Group Notes (Signed)
  [facility] LCSW Group Therapy Note  Date/Time: 02/25/17, 1300, late entry note  Type of Therapy/Topic:  Group Therapy:  Emotion Regulation  Participation Level:  Minimal   Mood: pleasant  Description of Group:    The purpose of this group is to assist patients in learning to regulate negative emotions and experience positive emotions. Patients will be guided to discuss ways in which they have been vulnerable to their negative emotions. These vulnerabilities will be juxtaposed with experiences of positive emotions or situations, and patients challenged to use positive emotions to combat negative ones. Special emphasis will be placed on coping with negative emotions in conflict situations, and patients will process healthy conflict resolution skills.  Therapeutic Goals: 1. Patient will identify two positive emotions or experiences to reflect on in order to balance out negative emotions:  2. Patient will label two or more emotions that they find the most difficult to experience:  3. Patient will be able to demonstrate positive conflict resolution skills through discussion or role plays:   Summary of Patient Progress: Pt joined group late but did participate.  He shared with the group that grief over the death of his father and anger are two difficult emotions for him to experience.  He was appropriate and engaged throughout group.       Therapeutic Modalities:   Cognitive Behavioral Therapy Feelings Identification Dialectical Behavioral Therapy  Daleen Squibb, LCSW

## 2017-02-26 NOTE — Progress Notes (Signed)
Provided and reviewed discharge paperwork and prescriptions. Verified understanding by use of teach back method. Verbalizes understanding as well. Denies SI/HI/AVH, pain. Belongings to be returned as noted on discharge. Pt's mother to pick him up sometime after lunch to transport home. Safety maintained with every 15 minute checks. Will continue to monitor.

## 2017-02-26 NOTE — [facility] Group Notes (Signed)
[facility] LCSW Group Therapy Note  Type of Therapy and Topic:  Group Therapy:  Goals Group: SMART Goals  Participation Level:  Patient did not attend group. CSW invited patient to group.   Description of Group:   The purpose of a daily goals group is to assist and guide patients in setting recovery/wellness-related goals.  The objective is to set goals as they relate to the crisis in which they were admitted. Patients will be using SMART goal modalities to set measurable goals.  Characteristics of realistic goals will be discussed and patients will be assisted in setting and processing how one will reach their goal. Facilitator will also assist patients in applying interventions and coping skills learned in psycho-education groups to the SMART goal and process how one will achieve defined goal.  Therapeutic Goals: -Patients will develop and document one goal related to or their crisis in which brought them into treatment. -Patients will be guided by LCSW using SMART goal setting modality in how to set a measurable, attainable, realistic and time sensitive goal.  -Patients will process barriers in reaching goal. -Patients will process interventions in how to overcome and successful in reaching goal.   Summary of Patient Progress:  Patient Goal: None identified at this time, patient did not attend group on this date.   Therapeutic Modalities:   Motivational Interviewing Engineer, manufacturing systems Therapy Crisis Intervention Model SMART goals setting  Berry Gallacher G. Garnette Czech MSW, LCSWA 02/26/2017 11:30 AM

## 2017-02-26 NOTE — Progress Notes (Signed)
D: Patient  denies SI/HI/AVH. Pt is pleasant and cooperative,affect is flat. Patient's thoughts are much organized, no bizarre behavior noted.  Pt  appears less anxious and he is interacting with peers and staff appropriately.  A: Pt was offered support and encouragement. Pt was given scheduled medications. Pt was encouraged to attend groups. Q 15 minute checks were done for safety.  R:Pt attends groups and interacts well with peers and staff. Pt is taking medication. Pt has no complaints.Pt receptive to treatment and safety maintained on unit.

## 2017-03-05 ENCOUNTER — Encounter: Payer: Self-pay | Admitting: Emergency Medicine

## 2017-03-05 ENCOUNTER — Emergency Department
Admission: EM | Admit: 2017-03-05 | Discharge: 2017-03-05 | Disposition: A | Discharge status: Home / Self Care | Payer: Medicare Other | Attending: Emergency Medicine | Admitting: Emergency Medicine

## 2017-03-05 DIAGNOSIS — M109 Gout, unspecified: Secondary | ICD-10-CM

## 2017-03-05 DIAGNOSIS — F17210 Nicotine dependence, cigarettes, uncomplicated: Secondary | ICD-10-CM | POA: Diagnosis not present

## 2017-03-05 DIAGNOSIS — I10 Essential (primary) hypertension: Secondary | ICD-10-CM | POA: Insufficient documentation

## 2017-03-05 DIAGNOSIS — M79674 Pain in right toe(s): Secondary | ICD-10-CM | POA: Diagnosis present

## 2017-03-05 DIAGNOSIS — E119 Type 2 diabetes mellitus without complications: Secondary | ICD-10-CM | POA: Insufficient documentation

## 2017-03-05 DIAGNOSIS — Z79899 Other long term (current) drug therapy: Secondary | ICD-10-CM | POA: Diagnosis not present

## 2017-03-05 DIAGNOSIS — M10071 Idiopathic gout, right ankle and foot: Secondary | ICD-10-CM | POA: Diagnosis not present

## 2017-03-05 MED ORDER — DEXAMETHASONE SODIUM PHOSPHATE 10 MG/ML IJ SOLN
10.0000 mg | Freq: Once | INTRAMUSCULAR | Status: AC
  Administered 2017-03-05: 10 mg via INTRAMUSCULAR
  Filled 2017-03-05: qty 1

## 2017-03-05 MED ORDER — OXYCODONE HCL 5 MG PO TABS: 5.0000 mg | ORAL_TABLET | Freq: Three times a day (TID) | ORAL | 0 refills | Status: DC | PRN

## 2017-03-05 MED ORDER — OXYCODONE-ACETAMINOPHEN 5-325 MG PO TABS
1.0000 | ORAL_TABLET | Freq: Once | ORAL | Status: AC
  Administered 2017-03-05: 1 via ORAL
  Filled 2017-03-05: qty 1

## 2017-03-05 MED ORDER — PREDNISONE 10 MG PO TABS: 10.0000 mg | ORAL_TABLET | Freq: Every day | ORAL | 0 refills | Status: DC

## 2017-03-05 NOTE — ED Triage Notes (Signed)
Pt to triage via WC, report pain/swelling to right foot, hx of gout and feels similar, pt reports takes low dose allopurinol.  Pt NAD at this time

## 2017-03-05 NOTE — Discharge Instructions (Signed)
Please take medications as prescribed and follow-up with your PCP in 2-3 days for recheck. Please make sure you are eating a low. Diet. Please drink lots of fluids

## 2017-03-05 NOTE — ED Notes (Signed)
Reviewed d/c instructions, follow-up care, prescriptions with pt. Pt verbalized understanding.  

## 2017-03-05 NOTE — ED Provider Notes (Signed)
[facility]-EMERGENCY DEPARTMENT Provider Note   CSN: [id number] Arrival date & time: 03/05/17  1853     History   Chief Complaint Chief Complaint  Patient presents with  . Gout    HPI Walter Pena. is a 36 y.o. male presents to the emergency department for evaluation of right great toe pain. Patient has a history of gout in the right great toe. Patient states his pain swelling and warmth that has been present for 3 days resembles gout. Toe is very painful, sensitive. He is on allopurinol daily. He has not been taking anything extra for his right great toe pain. He denies any other complaints, chest pain, shortness of breath, loss of appetite, nausea, vomiting. He is having a hard time a related due to moderate to severe right great toe pain. He denies any calf pain, swelling.  HPI  Past Medical History:  Diagnosis Date  . Gout   . Hidradenitis   . Hypertension   . Schizophrenia [facility])     Patient Active Problem List   Diagnosis Date Noted  . Diabetes (HCC) 02/18/2017  . Schizophrenia (HCC) 02/17/2017  . Alcohol use disorder (HCC) 02/17/2017  . Noncompliance 05/07/2016  . HTN (hypertension) 01/21/2016  . Cannabis use disorder, moderate, dependence (HCC) 01/21/2016  . Tobacco use disorder 01/21/2016  . Gout 01/21/2016  . Hidradenitis 01/21/2016    Past Surgical History:  Procedure Laterality Date  . sweat gland removal         Home Medications    Prior to Admission medications   Medication Sig Start Date End Date Taking? Authorizing Provider  allopurinol (ZYLOPRIM) 100 MG tablet Take 100 mg by mouth daily. 01/21/17   Historical Provider, MD  amantadine (SYMMETREL) 100 MG capsule Take 1 capsule (100 mg total) by mouth 2 (two) times daily. 02/25/17   Jimmy Footman, MD  amLODipine (NORVASC) 10 MG tablet Take 10 mg by mouth daily. 01/21/17   Historical Provider, MD  benazepril (LOTENSIN) 40 MG tablet Take 1 tablet by mouth daily. 01/21/17   Historical Provider,  MD  colchicine 0.6 MG tablet 1 tablet by mouth daily until gout pain is gone. 04/24/16   Joni Reining, PA-C  oxyCODONE (ROXICODONE) 5 MG immediate release tablet Take 1 tablet (5 mg total) by mouth every 8 (eight) hours as needed. 03/05/17 03/05/18  Evon Slack, PA-C  paliperidone (INVEGA SUSTENNA) 234 MG/1.5ML SUSP injection Inject 234 mg into the muscle once. Due May 7 03/23/17 03/23/17  Jimmy Footman, MD  paliperidone (INVEGA) 6 MG 24 hr tablet Take 2 tablets (12 mg total) by mouth at bedtime. 02/25/17   Jimmy Footman, MD  predniSONE (DELTASONE) 10 MG tablet Take 1 tablet (10 mg total) by mouth daily. 6,5,4,3,2,1 six day taper 03/05/17   Evon Slack, PA-C  traZODone (DESYREL) 150 MG tablet Take 1 tablet (150 mg total) by mouth at bedtime. 02/26/17   Jimmy Footman, MD    Family History Family History  Problem Relation Age of Onset  . Diabetes Mellitus II Mother   . Hypertension Mother     Social History Social History  Substance Use Topics  . Smoking status: Current Every Day Smoker    Packs/day: 1.00    Years: 9.00    Types: Cigarettes  . Smokeless tobacco: Never Used  . Alcohol use Yes     Allergies   Bactrim [sulfamethoxazole-trimethoprim]; Lisinopril; and Other   Review of Systems Review of Systems  Constitutional: Negative.  Negative for activity change,  appetite change, chills and fever.  HENT: Negative for congestion, ear pain, mouth sores, rhinorrhea, sinus pressure, sore throat and trouble swallowing.   Eyes: Negative for photophobia, pain and discharge.  Respiratory: Negative for cough, chest tightness and shortness of breath.   Cardiovascular: Negative for chest pain and leg swelling.  Gastrointestinal: Negative for abdominal distention, abdominal pain, diarrhea, nausea and vomiting.  Genitourinary: Negative for difficulty urinating and dysuria.  Musculoskeletal: Positive for arthralgias. Negative for back pain and gait problem.   Skin: Negative for color change and rash.  Neurological: Negative for dizziness and headaches.  Hematological: Negative for adenopathy.  Psychiatric/Behavioral: Negative for agitation and behavioral problems.     Physical Exam Updated Vital Signs BP 132/87 (BP Location: Right Arm)   Pulse 92   Temp 99.6 F (37.6 C) (Oral)   Resp 18   Ht 5\' 8"  (1.727 m)   Wt 104.3 kg   SpO2 96%   BMI 34.97 kg/m   Physical Exam  Constitutional: He appears well-developed and well-nourished.  HENT:  Head: Normocephalic and atraumatic.  Eyes: Conjunctivae are normal.  Neck: Normal range of motion. Neck supple.  Cardiovascular: Normal rate and regular rhythm.   No murmur heard. Pulmonary/Chest: Effort normal. No respiratory distress. He has no wheezes. He has no rales.  Abdominal: Soft. He exhibits no mass. There is no tenderness. There is no guarding.  Musculoskeletal: He exhibits no edema.  Examination of bilateral lower extremity shows patient has no swelling. He has no edema of the lower sugars. Examination of the right foot shows the patient has swelling warmth and extreme tenderness to the first MCP joint. There is no fluctuance or abscess. There is no streaking cellulitis. He is nontender throughout the calf. Sensation is intact distally.  Neurological: He is alert.  Skin: Skin is warm and dry.  Psychiatric: He has a normal mood and affect.  Nursing note and vitals reviewed.    ED Treatments / Results  Labs (all labs ordered are listed, but only abnormal results are displayed) Labs Reviewed - No data to display  EKG  EKG Interpretation None       Radiology No results found.  Procedures Procedures (including critical care time)  Medications Ordered in ED Medications  dexamethasone (DECADRON) injection 10 mg (not administered)  oxyCODONE-acetaminophen (PERCOCET/ROXICET) 5-325 MG per tablet 1 tablet (not administered)     Initial Impression / Assessment and Plan / ED  Course  I have reviewed the triage vital signs and the nursing notes.  Pertinent labs & imaging results that were available during my care of the patient were reviewed by me and considered in my medical decision making (see chart for details).     36 year old male with right great toe gout flareup. Symptoms been present for 2-3 days. He denies any other symptoms besides right great toe pain. Patient given shot of dexamethasone today in the emergency department with Percocet tablet. He is given a walker to help with ambulation. He is educated on signs and symptoms return to the ED for. He is sent home with prednisone taper and oxycodone. Will follow-up with PCP and 3-4 days for recheck.  Final Clinical Impressions(s) / ED Diagnoses   Final diagnoses:  Acute gout involving toe of right foot, unspecified cause    New Prescriptions New Prescriptions   OXYCODONE (ROXICODONE) 5 MG IMMEDIATE RELEASE TABLET    Take 1 tablet (5 mg total) by mouth every 8 (eight) hours as needed.   PREDNISONE (DELTASONE) 10 MG  TABLET    Take 1 tablet (10 mg total) by mouth daily. 6,5,4,3,2,1 six day taper     Evon Slack, PA-C 03/05/17 2109    Minna Antis, MD 03/05/17 [phone]

## 2017-03-05 NOTE — ED Notes (Signed)
See triage note, pt reports 3 days ago pain and swelling developed to right foot. Pt reports hx of gout and states "this feels like gout." Pt states he is not able to put pressure to right foot. Pulses intact to right pedal pulse.

## 2017-03-10 ENCOUNTER — Encounter: Payer: Self-pay | Admitting: *Deleted

## 2017-10-09 ENCOUNTER — Other Ambulatory Visit: Payer: Self-pay

## 2017-10-09 ENCOUNTER — Encounter: Payer: Self-pay | Admitting: Emergency Medicine

## 2017-10-09 ENCOUNTER — Emergency Department
Admission: EM | Admit: 2017-10-09 | Discharge: 2017-10-10 | Disposition: A | Discharge status: Home / Self Care | Payer: Medicare Other | Attending: Emergency Medicine | Admitting: Emergency Medicine

## 2017-10-09 ENCOUNTER — Emergency Department: Payer: Medicare Other

## 2017-10-09 DIAGNOSIS — Z139 Encounter for screening, unspecified: Secondary | ICD-10-CM | POA: Diagnosis present

## 2017-10-09 DIAGNOSIS — F209 Schizophrenia, unspecified: Secondary | ICD-10-CM | POA: Diagnosis not present

## 2017-10-09 DIAGNOSIS — F1210 Cannabis abuse, uncomplicated: Secondary | ICD-10-CM | POA: Insufficient documentation

## 2017-10-09 DIAGNOSIS — R5383 Other fatigue: Secondary | ICD-10-CM | POA: Insufficient documentation

## 2017-10-09 DIAGNOSIS — I10 Essential (primary) hypertension: Secondary | ICD-10-CM | POA: Insufficient documentation

## 2017-10-09 DIAGNOSIS — F17210 Nicotine dependence, cigarettes, uncomplicated: Secondary | ICD-10-CM | POA: Diagnosis not present

## 2017-10-09 DIAGNOSIS — R413 Other amnesia: Secondary | ICD-10-CM | POA: Diagnosis not present

## 2017-10-09 DIAGNOSIS — Z79899 Other long term (current) drug therapy: Secondary | ICD-10-CM | POA: Insufficient documentation

## 2017-10-09 LAB — COMPREHENSIVE METABOLIC PANEL
ALBUMIN: 3.6 g/dL (ref 3.5–5.0)
ALK PHOS: 91 U/L (ref 38–126)
ALT: 35 U/L (ref 17–63)
AST: 33 U/L (ref 15–41)
Anion gap: 8 (ref 5–15)
BILIRUBIN TOTAL: 0.9 mg/dL (ref 0.3–1.2)
BUN: 10 mg/dL (ref 6–20)
CO2: 28 mmol/L (ref 22–32)
Calcium: 9 mg/dL (ref 8.9–10.3)
Chloride: 100 mmol/L — ABNORMAL LOW (ref 101–111)
Creatinine, Ser: 1.43 mg/dL — ABNORMAL HIGH (ref 0.61–1.24)
GFR calc Af Amer: >60 mL/min (ref >60)
GFR calc non Af Amer: >60 mL/min (ref >60)
GLUCOSE: 126 mg/dL — AB (ref 65–99)
POTASSIUM: 3.3 mmol/L — AB (ref 3.5–5.1)
SODIUM: 136 mmol/L (ref 135–145)
TOTAL PROTEIN: 8.7 g/dL — AB (ref 6.5–8.1)

## 2017-10-09 LAB — URINALYSIS, COMPLETE (UACMP) WITH MICROSCOPIC
BILIRUBIN URINE: NEGATIVE
Bacteria, UA: NONE SEEN
GLUCOSE, UA: NEGATIVE mg/dL
HGB URINE DIPSTICK: NEGATIVE
KETONES UR: NEGATIVE mg/dL
NITRITE: NEGATIVE
PH: 7 (ref 5.0–8.0)
Protein, ur: NEGATIVE mg/dL
SPECIFIC GRAVITY, URINE: 1.006 (ref 1.005–1.030)

## 2017-10-09 LAB — URINE DRUG SCREEN, QUALITATIVE (ARMC ONLY)
AMPHETAMINES, UR SCREEN: NOT DETECTED
Barbiturates, Ur Screen: NOT DETECTED
Benzodiazepine, Ur Scrn: NOT DETECTED
COCAINE METABOLITE, UR ~~LOC~~: NOT DETECTED
Cannabinoid 50 Ng, Ur ~~LOC~~: POSITIVE — AB
MDMA (ECSTASY) UR SCREEN: NOT DETECTED
METHADONE SCREEN, URINE: NOT DETECTED
OPIATE, UR SCREEN: NOT DETECTED
PHENCYCLIDINE (PCP) UR S: NOT DETECTED
Tricyclic, Ur Screen: NOT DETECTED

## 2017-10-09 LAB — CBC
HEMATOCRIT: 40.2 % (ref 40.0–52.0)
HEMOGLOBIN: 13.4 g/dL (ref 13.0–18.0)
MCH: 28.8 pg (ref 26.0–34.0)
MCHC: 33.3 g/dL (ref 32.0–36.0)
MCV: 86.4 fL (ref 80.0–100.0)
Platelets: 328 10*3/uL (ref 150–440)
RBC: 4.65 MIL/uL (ref 4.40–5.90)
RDW: 15.6 % — AB (ref 11.5–14.5)
WBC: 14.7 10*3/uL — AB (ref 3.8–10.6)

## 2017-10-09 LAB — SALICYLATE LEVEL: Salicylate Lvl: <7 mg/dL (ref 2.8–30.0)

## 2017-10-09 LAB — ETHANOL: Alcohol, Ethyl (B): <10 mg/dL (ref <10)

## 2017-10-09 LAB — ACETAMINOPHEN LEVEL: Acetaminophen (Tylenol), Serum: <10 ug/mL — ABNORMAL LOW (ref 10–30)

## 2017-10-09 MED ORDER — HALOPERIDOL 5 MG PO TABS: 5.0000 mg | ORAL_TABLET | Freq: Every day | ORAL | 0 refills | Status: DC

## 2017-10-09 MED ORDER — CEFTRIAXONE SODIUM IN DEXTROSE 20 MG/ML IV SOLN
1.0000 g | Freq: Once | INTRAVENOUS | Status: AC
  Administered 2017-10-09: 1 g via INTRAVENOUS

## 2017-10-09 MED ORDER — CEPHALEXIN 500 MG PO CAPS: 500.0000 mg | ORAL_CAPSULE | Freq: Two times a day (BID) | ORAL | 0 refills | Status: DC

## 2017-10-09 NOTE — ED Notes (Signed)
Patient able to wake up to light touch and calling his name.  When asked questions patient mumbles most answers but keeps falling asleep.  This nurse unable to fully assess patient at this time.

## 2017-10-09 NOTE — ED Notes (Signed)
Patient is AAOx3.  Very sleepy.  Continues to doze off frequently during triage assessment.  Wakes with loud verbal stimulation, sometimes needs light touch to wake him.

## 2017-10-09 NOTE — Discharge Instructions (Signed)
Take the Haldol prescribed today as it was prescribed, until you are able to follow-up with your regular psychiatrist.  You should then discuss your medication regimen with your psychiatrist.  Return to the ER for any new or worsening symptoms including worsening drowsiness or fatigue, weakness, change in mental status, severe headaches, confusion, thoughts of wanting to hurt yourself or anyone else, or any other new or worsening symptoms that concern you.

## 2017-10-09 NOTE — ED Notes (Signed)
Pt stood by bedside with this nurse to collect urine sample.  Patient then given meal tray and sitting up in bed eating.

## 2017-10-09 NOTE — ED Notes (Signed)
Pt given cell phone to call loved ones for ride, pt changing clothes, lobby called again for family waiting

## 2017-10-09 NOTE — BH Assessment (Signed)
Assessment Note  Walter Pena. is an 36 y.o. male. The pt came in after displaying bizarre behavior.  According to the pt's mother the pt was calling his sister and not talking coherently.  She reported she does not think he has been sleeping at night and appeared to be responding to internal voices.  The pt's mother also stated the pt seemed "out of it" and eventually fell to the floor.  The pt also stated he has not been sleeping for several days.  He said he was tired, but couldn't sleep.  He gets medication from [facility] and he has not taken his medication shot for about 2 months.  He denied any recent psychosis, but admitted he has had auditory and visual hallucinations in the past.    The pt reported he has not smoked marijuana in about 2 months and he passed his drug test last month.  However, he UDS was positive for marijuana.  He said when he was using he used about "20 dollars worth" per day.  The pt denied SI and HI.  The pt's mother reported she has never known of the pt to be suicidal or homicidal.  She stated he typically gets aggressive when taken to the hospital in the past, but was cooperative this time when he came to the hospital.  The pt was cooperative during the assessment.    Diagnosis: Schizophrenia  Past Medical History:  Past Medical History:  Diagnosis Date  . Gout   . Hidradenitis   . Hypertension   . Schizophrenia [facility])     Past Surgical History:  Procedure Laterality Date  . sweat gland removal      Family History:  Family History  Problem Relation Age of Onset  . Diabetes Mellitus II Mother   . Hypertension Mother     Social History:  reports that he has been smoking cigarettes.  He has a 9.00 pack-year smoking history. he has never used smokeless tobacco. He reports that he drinks alcohol. He reports that he uses drugs. Drug: Marijuana.  Additional Social History:  Alcohol / Drug Use Pain Medications: See PTA Prescriptions: See PTA Over the  Counter: See PTA History of alcohol / drug use?: Yes Longest period of sobriety (when/how long): one month Negative Consequences of Use: Legal Substance #1 Name of Substance 1: marijuana 1 - Amount (size/oz): "20 dollars worth a day" 1 - Last Use / Amount: "a month ago"  CIWA: CIWA-Ar BP: (!) 123/92 Pulse Rate: 90 COWS:    Allergies:  Allergies  Allergen Reactions  . Bactrim [Sulfamethoxazole-Trimethoprim] Nausea And Vomiting  . Lisinopril Swelling  . Other Rash    aloe    Home Medications:  (Not in a hospital admission)  OB/GYN Status:  No LMP for male patient.  General Assessment Data Location of Assessment: [facility] ED TTS Assessment: In system Is this a Tele or Face-to-Face Assessment?: Face-to-Face Is this an Initial Assessment or a Re-assessment for this encounter?: Initial Assessment Marital status: Single Maiden name: NA Living Arrangements: Alone Can pt return to current living arrangement?: Yes Admission Status: Voluntary Is patient capable of signing voluntary admission?: Yes Referral Source: Self/Family/Friend Insurance type: Medicare     Crisis Care Plan Living Arrangements: Alone Legal Guardian: Other:(Self) Name of Psychiatrist: [facility] Psychiatrist Name of Therapist: [facility]  Education Status Is patient currently in school?: No Current Grade: NA Highest grade of school patient has completed: Unable to assess Name of school: NA Contact person: NA  Risk  to self with the past 6 months Suicidal Ideation: No Has patient been a risk to self within the past 6 months prior to admission? : No Suicidal Intent: No Has patient had any suicidal intent within the past 6 months prior to admission? : No Is patient at risk for suicide?: No Suicidal Plan?: No Has patient had any suicidal plan within the past 6 months prior to admission? : No Access to Means: No What has been your use of drugs/alcohol within the last 12 months?: marijuana use Previous  Attempts/Gestures: No How many times?: 0 Other Self Harm Risks: none Triggers for Past Attempts: None known Intentional Self Injurious Behavior: None Family Suicide History: No Recent stressful life event(s): Other (Comment)(missing medication) Persecutory voices/beliefs?: Yes Depression: No Depression Symptoms: Insomnia Substance abuse history and/or treatment for substance abuse?: Yes Suicide prevention information given to non-admitted patients: Not applicable  Risk to Others within the past 6 months Homicidal Ideation: No Does patient have any lifetime risk of violence toward others beyond the six months prior to admission? : No Thoughts of Harm to Others: No Current Homicidal Intent: No Current Homicidal Plan: No Access to Homicidal Means: No Identified Victim: NA History of harm to others?: No Assessment of Violence: None Noted Violent Behavior Description: none Does patient have access to weapons?: No Criminal Charges Pending?: No Does patient have a court date: No Is patient on probation?: Yes  Psychosis Hallucinations: Auditory, Visual Delusions: None noted  Mental Status Report Appearance/Hygiene: Unremarkable, In scrubs Eye Contact: Good Motor Activity: Freedom of movement, Unremarkable Speech: Logical/coherent Level of Consciousness: Alert Mood: Pleasant Affect: Appropriate to circumstance Anxiety Level: None Thought Processes: Coherent, Relevant Judgement: Partial Orientation: Person, Place, Situation Obsessive Compulsive Thoughts/Behaviors: None  Cognitive Functioning Concentration: Normal Memory: Recent Intact, Remote Intact IQ: Average Insight: Fair Impulse Control: Fair Appetite: Good Weight Loss: 0 Weight Gain: 0 Sleep: Decreased Total Hours of Sleep: 1 Vegetative Symptoms: None  ADLScreening [facility] Assessment Services) Patient's cognitive ability adequate to safely complete daily activities?: Yes Patient able to express need for  assistance with ADLs?: Yes Independently performs ADLs?: Yes (appropriate for developmental age)  Prior Inpatient Therapy Prior Inpatient Therapy: Yes Prior Therapy Dates: 02/2017, 2017, 2012 Prior Therapy Facilty/Provider(s): [facility], Willy Eddy Reason for Treatment: psychosis  Prior Outpatient Therapy Prior Outpatient Therapy: Yes Prior Therapy Dates: current Prior Therapy Facilty/Provider(s): [facility] Reason for Treatment: psychosis Does patient have an ACCT team?: No Does patient have Intensive In-House Services?  : No Does patient have [facility] services? : No Does patient have P4CC services?: No  ADL Screening (condition at time of admission) Patient's cognitive ability adequate to safely complete daily activities?: Yes Is the patient deaf or have difficulty hearing?: No Does the patient have difficulty seeing, even when wearing glasses/contacts?: No Does the patient have difficulty concentrating, remembering, or making decisions?: No Patient able to express need for assistance with ADLs?: Yes Does the patient have difficulty dressing or bathing?: No Independently performs ADLs?: Yes (appropriate for developmental age) Does the patient have difficulty walking or climbing stairs?: No Weakness of Legs: None Weakness of Arms/Hands: None  Home Assistive Devices/Equipment Home Assistive Devices/Equipment: None  Therapy Consults (therapy consults require a physician order) PT Evaluation Needed: No OT Evalulation Needed: No SLP Evaluation Needed: No Abuse/Neglect Assessment (Assessment to be complete while patient is alone) Abuse/Neglect Assessment Can Be Completed: Yes Physical Abuse: Denies Verbal Abuse: Denies Sexual Abuse: Denies Exploitation of patient/patient's resources: Denies Self-Neglect: Denies Values / Beliefs Cultural Requests During Hospitalization:  None Spiritual Requests During Hospitalization: None Consults Spiritual Care Consult Needed: No Social Work Consult  Needed: No Merchant navy officerAdvance Directives (For Healthcare) Does Patient Have a Medical Advance Directive?: No Would patient like information on creating a medical advance directive?: No - Patient declined    Additional Information 1:1 In Past 12 Months?: No CIRT Risk: No Elopement Risk: No Does patient have medical clearance?: Yes     Disposition:  Disposition Initial Assessment Completed for this Encounter: Yes Disposition of Patient: Other dispositions Other disposition(s): Other (Comment)(SOC)  On Site Evaluation by:   Reviewed with Physician:    Ottis StainGarvin, Dail Lerew Jermaine 10/09/2017 8:31 PM

## 2017-10-09 NOTE — ED Notes (Signed)
Patient's aunt, Dionne BucyLois McAdams brought patient and is now leaving.  Her phone number is in the chart.

## 2017-10-09 NOTE — ED Provider Notes (Signed)
[facility] Emergency Department Provider Note   ____________________________________________   First MD Initiated Contact with Patient 10/09/17 1254     (approximate)  I have reviewed the triage vital signs and the nursing notes.   HISTORY  Chief Complaint Acting unusual possibly due to schizophrenia  EM caveat: Patient reports he cannot remember, is lethargic and is unable to provide history  HPI Walter Pena. is a 36 y.o. male who himself cannot provide history  Patient states that he feels real tired.  He has not been sleeping much, and he feels like he needs to sleep.  He cannot tell me much else, he denies being in pain.  Denies any fevers.  Tells me that he has not been on his medications for a while, but started taking some a couple days ago.  Denies headache.  Fevers.  Neck pain.  Chest pain.  Trouble breathing prior abdominal pain.  Reports use alcohol occasionally and sometimes marijuana.  Denies any other substance use or ingestion.  Denies any thoughts about hurting himself or anyone else.  Denies suicidal ideation.  Past Medical History:  Diagnosis Date  . Gout   . Hidradenitis   . Hypertension   . Schizophrenia [facility])     Patient Active Problem List   Diagnosis Date Noted  . Diabetes (HCC) 02/18/2017  . Schizophrenia (HCC) 02/17/2017  . Alcohol use disorder 02/17/2017  . Noncompliance 05/07/2016  . HTN (hypertension) 01/21/2016  . Cannabis use disorder, moderate, dependence (HCC) 01/21/2016  . Tobacco use disorder 01/21/2016  . Gout 01/21/2016  . Hidradenitis 01/21/2016    Past Surgical History:  Procedure Laterality Date  . sweat gland removal      Prior to Admission medications   Medication Sig Start Date End Date Taking? Authorizing Provider  allopurinol (ZYLOPRIM) 100 MG tablet Take 100 mg by mouth daily. 01/21/17   [provider]  amantadine (SYMMETREL) 100 MG capsule Take 1 capsule (100 mg total) by  mouth 2 (two) times daily. 02/25/17   Jimmy Footman, MD  amLODipine (NORVASC) 10 MG tablet Take 10 mg by mouth daily. 01/21/17   [provider]  benazepril (LOTENSIN) 40 MG tablet Take 1 tablet by mouth daily. 01/21/17   [provider]  colchicine 0.6 MG tablet 1 tablet by mouth daily until gout pain is gone. 04/24/16   Joni Reining, PA-C  oxyCODONE (ROXICODONE) 5 MG immediate release tablet Take 1 tablet (5 mg total) by mouth every 8 (eight) hours as needed. 03/05/17 03/05/18  Evon Slack, PA-C  paliperidone (INVEGA SUSTENNA) 234 MG/1.5ML SUSP injection Inject 234 mg into the muscle once. Due May 7 03/23/17 03/23/17  Jimmy Footman, MD  paliperidone (INVEGA) 6 MG 24 hr tablet Take 2 tablets (12 mg total) by mouth at bedtime. 02/25/17   Jimmy Footman, MD  predniSONE (DELTASONE) 10 MG tablet Take 1 tablet (10 mg total) by mouth daily. 6,5,4,3,2,1 six day taper 03/05/17   Evon Slack, PA-C  traZODone (DESYREL) 150 MG tablet Take 1 tablet (150 mg total) by mouth at bedtime. 02/26/17   Jimmy Footman, MD    Allergies Bactrim [sulfamethoxazole-trimethoprim]; Lisinopril; and Other  Family History  Problem Relation Age of Onset  . Diabetes Mellitus II Mother   . Hypertension Mother     Social History Social History   Tobacco Use  . Smoking status: Current Every Day Smoker    Packs/day: 1.00    Years: 9.00    Pack years:  9.00    Types: Cigarettes  . Smokeless tobacco: Never Used  Substance Use Topics  . Alcohol use: Yes  . Drug use: Yes    Types: Marijuana    Review of Systems EM caveat   ____________________________________________   PHYSICAL EXAM:  VITAL SIGNS: ED Triage Vitals  Enc Vitals Group     BP 10/09/17 1149 113/74     Pulse Rate 10/09/17 1149 81     Resp 10/09/17 1149 16     Temp 10/09/17 1149 98.9 F (37.2 C)     Temp src --      SpO2 10/09/17 1149 94 %     Weight 10/09/17 1150 250 lb (113.4  kg)     Height 10/09/17 1150 5\' 7"  (1.702 m)     Head Circumference --      Peak Flow --      Pain Score 10/09/17 1149 0     Pain Loc --      Pain Edu? --      Excl. in GC? --     Constitutional: Somnolent, snoring.  He alerts to verbal stimuli and opens his eyes, but has a slightly disconjugate appearance of his eyes with some mild left side esotropia. Eyes: Conjunctivae are normal.  Pupils are midpoint and reactive bilateral.  No pinpoint pupils. Head: Atraumatic. Nose: No congestion/rhinnorhea. Mouth/Throat: Mucous membranes are moist. Neck: No stridor.   Cardiovascular: Normal rate, regular rhythm. Grossly normal heart sounds.  Good peripheral circulation. Respiratory: Normal respiratory effort.  No retractions. Lungs CTAB. Gastrointestinal: Soft and nontender. No distention. Musculoskeletal: No lower extremity tenderness nor edema. Neurologic: Slightly slurred speech and language. No gross focal neurologic deficits are appreciated though he has some difficulty following commands, for instance he will raise both arms briefly but then puts them back down and falls fast asleep.  He is able to move all extremities, no obvious deficit is noted at present but the exam is limited.  His smile seems symmetric. Skin:  Skin is warm, dry and intact. No rash noted. Psychiatric: Mood and affect are normal. Speech and behavior are normal.  ____________________________________________   LABS (all labs ordered are listed, but only abnormal results are displayed)  Labs Reviewed  COMPREHENSIVE METABOLIC PANEL - Abnormal; Notable for the following components:      Result Value   Potassium 3.3 (*)    Chloride 100 (*)    Glucose, Bld 126 (*)    Creatinine, Ser 1.43 (*)    Total Protein 8.7 (*)    All other components within normal limits  ACETAMINOPHEN LEVEL - Abnormal; Notable for the following components:   Acetaminophen (Tylenol), Serum <10 (*)    All other components within normal limits    CBC - Abnormal; Notable for the following components:   WBC 14.7 (*)    RDW 15.6 (*)    All other components within normal limits  URINE DRUG SCREEN, QUALITATIVE ([facility] ONLY) - Abnormal; Notable for the following components:   Cannabinoid 50 Ng, Ur [place] POSITIVE (*)    All other components within normal limits  BLOOD GAS, VENOUS - Abnormal; Notable for the following components:   Bicarbonate 31.6 (*)    Acid-Base Excess 4.6 (*)    All other components within normal limits  ETHANOL  SALICYLATE LEVEL  URINALYSIS, COMPLETE (UACMP) WITH MICROSCOPIC   ____________________________________________  EKG  Reviewed and interpreted by me at 1410 Heart rate 75 QRS 90 QTC 470 Normal sinus rhythm, LVH with repolarization abnormality.  No acute change from previous ____________________________________________  RADIOLOGY  Ct Head Wo Contrast  Result Date: 10/09/2017 CLINICAL DATA:  Arrives with family members who told staff that patient has history of schizophrenia and has not taken any of his medications "in a while". Lethargic/sleepy. Denies auditory of visuall hallucinations at this time. Patient constantly moving during exam. EXAM: CT HEAD WITHOUT CONTRAST TECHNIQUE: Contiguous axial images were obtained from the base of the skull through the vertex without intravenous contrast. COMPARISON:  Head CT dated 02/14/2017. FINDINGS: Study limited by patient motion artifact. Brain: Ventricles are normal in size and configuration. All areas of the brain demonstrate grossly normal gray-white matter differentiation. There is no mass, hemorrhage, edema or other evidence of acute parenchymal abnormality. No extra-axial hemorrhage. Vascular: No hyperdense vessel or unexpected calcification. Skull: Normal. Negative for fracture or focal lesion. Sinuses/Orbits: No acute finding. Other: None. IMPRESSION: Negative head CT, with study limitations detailed above. No evidence of intracranial mass, hemorrhage or edema.  Electronically Signed   By: Bary Richard M.D.   On: 10/09/2017 13:54    ____________________________________________   PROCEDURES  Procedure(s) performed: None  Procedures  Critical Care performed: No  ____________________________________________   INITIAL IMPRESSION / ASSESSMENT AND PLAN / ED COURSE  Pertinent labs & imaging results that were available during my care of the patient were reviewed by me and considered in my medical decision making (see chart for details).  Patient presents for evaluation for concerns of being noncompliant with his medications and change in his behavior.  Does have a history of schizophrenia, and was reportedly dropped up by family for further evaluation.  Is currently very somnolent, unable to provide a reasonable history at this time.  Clinical Course as of Oct 09 1512  Fri Oct 09, 2017  1322 Called Pearly Karau (Mother). Mom reports he stopped taking his medication for several days. He recently 'aint talking right' which she noted last night when he came for thanksgiving. He called mother several times last night and he has seemed similar in the past. He has seemed 'out of it' all day yesterday and had seemed real tired but hasn't been sleeping. He was up and walking around yesterday but he just seemed real sleepy and was not acting normally. He does smoke THC from time to time.  Mother reports he has not had any known SI, hallucinations, or been making any threatening statements.   [MQ]  1438 Patient mental status is improving.  He is now able to sit up and eating a meal at the side of the bed.  Appears in no distress, but does appear to remain slightly somnolent.  [MQ]    Clinical Course User Index [MQ] Sharyn Creamer, MD   ----------------------------------------- 3:11 PM on 10/09/2017 -----------------------------------------  Patient is now sitting up in bed, has eaten a meal, is alert and oriented.  He reports he recently restarted his  psychiatric medications but cannot remember exactly which ones they are, and reports that they made him very sleepy.  He denies attempt to harm himself or anyone else.  Appears he may be missed using her mismanaging clearly improving at this time.  He now has conjugate gaze, been able to eat a sandwich, and is resting in no distress.  At this time I will place a consult to tele-psychiatry for further evaluation.  His medical workup is thus far been very reassuring.  At the present time he complains of nothing other than wishing he could get more sleep.  Ongoing  care assigned to Dr. Marisa Severin  ____________________________________________   FINAL CLINICAL IMPRESSION(S) / ED DIAGNOSES  Final diagnoses:  Encounter for medical screening examination  Schizophrenia, unspecified type (HCC)      NEW MEDICATIONS STARTED DURING THIS VISIT:  This SmartLink is deprecated. Use AVSMEDLIST instead to display the medication list for a patient.   Note:  This document was prepared using Dragon voice recognition software and may include unintentional dictation errors.     Sharyn Creamer, MD 10/09/17 [id number]

## 2017-10-09 NOTE — ED Notes (Addendum)
Patient's family states they are leaving patient here.  Family states, "right now he's not cooperating really good but he's kind of cooperating.  They state, "he just needs to take his medicine."  Denies that patient has mentioned hurting himself or others.  He is saying things that don't make sense.  Aunt states patient hasn't slept in 3-4 days.  Patient normally goes to [facility] but has been missing appointments.

## 2017-10-09 NOTE — ED Notes (Signed)
Changed into hospital paper scrubs.  Clothing placed in patient belonging bags, labeled.  2 bags used.

## 2017-10-09 NOTE — ED Triage Notes (Signed)
Arrives with family members who told staff that patient has history of schizophrenia and has not taken any of his medications "In a while" and that they were unable to stay with patient-- so they left the grounds..  Patient is sleepy.  Denies SI/ HI.  Denies auditory of visuall hallucinations at this time.  Patient is calm and cooperative.

## 2017-10-09 NOTE — ED Notes (Signed)
Attempted to call patient's 3 family members listed as emergency contacts without answer from all three.

## 2017-10-09 NOTE — ED Notes (Signed)
TTS with pt att; [facility] consultation initiated

## 2017-10-09 NOTE — ED Notes (Signed)
SOC machine placed in pt room for [facility] consult

## 2017-10-09 NOTE — ED Notes (Signed)
ED Provider at bedside. 

## 2017-10-09 NOTE — ED Notes (Signed)
First Nurse Note: Patient has schizophrenia and has not taken medication in months according to person accompanying patient.

## 2017-10-09 NOTE — ED Notes (Signed)
Contact with mother, Billyray Grudzien, [phone], reports will find ride for pt in the next 40 min

## 2017-10-09 NOTE — ED Notes (Signed)
Call to [facility], no answer, other numbers in pt demographics attempted without answer

## 2017-10-10 LAB — BLOOD GAS, VENOUS
Acid-Base Excess: 4.6 mmol/L — ABNORMAL HIGH (ref 0.0–2.0)
BICARBONATE: 31.6 mmol/L — AB (ref 20.0–28.0)
Patient temperature: 37
pCO2, Ven: 56 mmHg (ref 44.0–60.0)
pH, Ven: 7.36 (ref 7.250–7.430)

## 2017-10-10 NOTE — ED Notes (Signed)
Pt reports brother in lobby to pick up, pt walked out to lobby after discussing meds and follow up

## 2017-10-20 ENCOUNTER — Emergency Department
Admission: EM | Admit: 2017-10-20 | Discharge: 2017-10-20 | Disposition: A | Discharge status: Other Acute Inpatient Hospital | Payer: Medicare Other | Attending: Emergency Medicine | Admitting: Emergency Medicine

## 2017-10-20 ENCOUNTER — Other Ambulatory Visit: Payer: Self-pay

## 2017-10-20 ENCOUNTER — Encounter: Payer: Self-pay | Admitting: *Deleted

## 2017-10-20 ENCOUNTER — Inpatient Hospital Stay
Admission: AD | Admit: 2017-10-20 | Discharge: 2017-10-27 | DRG: 885 | Disposition: A | Discharge status: Home / Self Care | Payer: Medicare Other | Attending: Psychiatry | Admitting: Psychiatry

## 2017-10-20 DIAGNOSIS — Z8659 Personal history of other mental and behavioral disorders: Secondary | ICD-10-CM

## 2017-10-20 DIAGNOSIS — Z9114 Patient's other noncompliance with medication regimen: Secondary | ICD-10-CM | POA: Insufficient documentation

## 2017-10-20 DIAGNOSIS — R460 Very low level of personal hygiene: Secondary | ICD-10-CM | POA: Insufficient documentation

## 2017-10-20 DIAGNOSIS — Z9119 Patient's noncompliance with other medical treatment and regimen: Secondary | ICD-10-CM

## 2017-10-20 DIAGNOSIS — E119 Type 2 diabetes mellitus without complications: Secondary | ICD-10-CM | POA: Diagnosis present

## 2017-10-20 DIAGNOSIS — E876 Hypokalemia: Secondary | ICD-10-CM | POA: Diagnosis present

## 2017-10-20 DIAGNOSIS — Z91199 Patient's noncompliance with other medical treatment and regimen due to unspecified reason: Secondary | ICD-10-CM

## 2017-10-20 DIAGNOSIS — Z91048 Other nonmedicinal substance allergy status: Secondary | ICD-10-CM

## 2017-10-20 DIAGNOSIS — F17210 Nicotine dependence, cigarettes, uncomplicated: Secondary | ICD-10-CM | POA: Diagnosis present

## 2017-10-20 DIAGNOSIS — Z888 Allergy status to other drugs, medicaments and biological substances status: Secondary | ICD-10-CM

## 2017-10-20 DIAGNOSIS — F209 Schizophrenia, unspecified: Secondary | ICD-10-CM | POA: Diagnosis not present

## 2017-10-20 DIAGNOSIS — I10 Essential (primary) hypertension: Secondary | ICD-10-CM | POA: Diagnosis present

## 2017-10-20 DIAGNOSIS — M109 Gout, unspecified: Secondary | ICD-10-CM | POA: Diagnosis present

## 2017-10-20 DIAGNOSIS — Z882 Allergy status to sulfonamides status: Secondary | ICD-10-CM | POA: Diagnosis not present

## 2017-10-20 DIAGNOSIS — Z79899 Other long term (current) drug therapy: Secondary | ICD-10-CM | POA: Diagnosis not present

## 2017-10-20 DIAGNOSIS — N390 Urinary tract infection, site not specified: Secondary | ICD-10-CM | POA: Diagnosis present

## 2017-10-20 DIAGNOSIS — Z046 Encounter for general psychiatric examination, requested by authority: Secondary | ICD-10-CM | POA: Diagnosis not present

## 2017-10-20 DIAGNOSIS — F29 Unspecified psychosis not due to a substance or known physiological condition: Secondary | ICD-10-CM | POA: Diagnosis present

## 2017-10-20 DIAGNOSIS — F1220 Cannabis dependence, uncomplicated: Secondary | ICD-10-CM | POA: Diagnosis present

## 2017-10-20 DIAGNOSIS — F1020 Alcohol dependence, uncomplicated: Secondary | ICD-10-CM | POA: Diagnosis present

## 2017-10-20 DIAGNOSIS — F203 Undifferentiated schizophrenia: Secondary | ICD-10-CM

## 2017-10-20 DIAGNOSIS — L732 Hidradenitis suppurativa: Secondary | ICD-10-CM | POA: Diagnosis present

## 2017-10-20 LAB — COMPREHENSIVE METABOLIC PANEL
ALK PHOS: 83 U/L (ref 38–126)
ALT: 42 U/L (ref 17–63)
AST: 35 U/L (ref 15–41)
Albumin: 4 g/dL (ref 3.5–5.0)
Anion gap: 12 (ref 5–15)
BUN: 8 mg/dL (ref 6–20)
CALCIUM: 9.2 mg/dL (ref 8.9–10.3)
CHLORIDE: 102 mmol/L (ref 101–111)
CO2: 24 mmol/L (ref 22–32)
Creatinine, Ser: 1.07 mg/dL (ref 0.61–1.24)
Glucose, Bld: 130 mg/dL — ABNORMAL HIGH (ref 65–99)
Potassium: 3.2 mmol/L — ABNORMAL LOW (ref 3.5–5.1)
Sodium: 138 mmol/L (ref 135–145)
TOTAL PROTEIN: 9.2 g/dL — AB (ref 6.5–8.1)
Total Bilirubin: 0.9 mg/dL (ref 0.3–1.2)

## 2017-10-20 LAB — CBC
HEMATOCRIT: 39.6 % — AB (ref 40.0–52.0)
Hemoglobin: 13 g/dL (ref 13.0–18.0)
MCH: 28.8 pg (ref 26.0–34.0)
MCHC: 32.8 g/dL (ref 32.0–36.0)
MCV: 87.7 fL (ref 80.0–100.0)
Platelets: 334 10*3/uL (ref 150–440)
RBC: 4.52 MIL/uL (ref 4.40–5.90)
RDW: 15.8 % — AB (ref 11.5–14.5)
WBC: 17.4 10*3/uL — ABNORMAL HIGH (ref 3.8–10.6)

## 2017-10-20 LAB — URINE DRUG SCREEN, QUALITATIVE (ARMC ONLY)
Amphetamines, Ur Screen: NOT DETECTED
BARBITURATES, UR SCREEN: NOT DETECTED
BENZODIAZEPINE, UR SCRN: NOT DETECTED
Cannabinoid 50 Ng, Ur ~~LOC~~: POSITIVE — AB
Cocaine Metabolite,Ur ~~LOC~~: NOT DETECTED
MDMA (Ecstasy)Ur Screen: NOT DETECTED
METHADONE SCREEN, URINE: NOT DETECTED
OPIATE, UR SCREEN: NOT DETECTED
Phencyclidine (PCP) Ur S: NOT DETECTED
Tricyclic, Ur Screen: NOT DETECTED

## 2017-10-20 LAB — ETHANOL: Alcohol, Ethyl (B): <10 mg/dL (ref <10)

## 2017-10-20 LAB — ACETAMINOPHEN LEVEL

## 2017-10-20 LAB — SALICYLATE LEVEL

## 2017-10-20 MED ORDER — AMLODIPINE BESYLATE 5 MG PO TABS: 10.0000 mg | ORAL_TABLET | Freq: Every day | ORAL | Status: DC

## 2017-10-20 MED ORDER — MAGNESIUM HYDROXIDE 400 MG/5ML PO SUSP: 30.0000 mL | Freq: Every day | ORAL | Status: DC | PRN

## 2017-10-20 MED ORDER — HALOPERIDOL 5 MG PO TABS
5.0000 mg | ORAL_TABLET | Freq: Every day | ORAL | Status: DC
  Administered 2017-10-20: 5 mg via ORAL
  Filled 2017-10-20: qty 1

## 2017-10-20 MED ORDER — HALOPERIDOL 5 MG PO TABS: 5.0000 mg | ORAL_TABLET | Freq: Every day | ORAL | Status: DC

## 2017-10-20 MED ORDER — BENAZEPRIL HCL 40 MG PO TABS: 40.0000 mg | ORAL_TABLET | Freq: Every day | ORAL | Status: DC

## 2017-10-20 MED ORDER — AMLODIPINE BESYLATE 5 MG PO TABS
10.0000 mg | ORAL_TABLET | Freq: Once | ORAL | Status: AC
  Administered 2017-10-20: 10 mg via ORAL

## 2017-10-20 MED ORDER — ALLOPURINOL 100 MG PO TABS: 100.0000 mg | ORAL_TABLET | Freq: Every day | ORAL | Status: DC

## 2017-10-20 MED ORDER — ACETAMINOPHEN 325 MG PO TABS: 650.0000 mg | ORAL_TABLET | Freq: Four times a day (QID) | ORAL | Status: DC | PRN

## 2017-10-20 MED ORDER — TRAZODONE HCL 50 MG PO TABS: 150.0000 mg | ORAL_TABLET | Freq: Every day | ORAL | Status: DC

## 2017-10-20 MED ORDER — AMANTADINE HCL 100 MG PO CAPS
100.0000 mg | ORAL_CAPSULE | Freq: Two times a day (BID) | ORAL | Status: DC
  Administered 2017-10-20 – 2017-10-27 (×14): 100 mg via ORAL
  Filled 2017-10-20 (×14): qty 1

## 2017-10-20 MED ORDER — TRAZODONE HCL 50 MG PO TABS
150.0000 mg | ORAL_TABLET | Freq: Every day | ORAL | Status: DC
  Administered 2017-10-20: 23:00:00 150 mg via ORAL
  Filled 2017-10-20: qty 1

## 2017-10-20 MED ORDER — AMLODIPINE BESYLATE 5 MG PO TABS
10.0000 mg | ORAL_TABLET | Freq: Every day | ORAL | Status: DC
  Administered 2017-10-21 – 2017-10-27 (×7): 10 mg via ORAL
  Filled 2017-10-20 (×7): qty 2

## 2017-10-20 MED ORDER — INSULIN ASPART 100 UNIT/ML ~~LOC~~ SOLN: 0.0000 [IU] | Freq: Three times a day (TID) | SUBCUTANEOUS | Status: DC

## 2017-10-20 MED ORDER — AMLODIPINE BESYLATE 5 MG PO TABS
ORAL_TABLET | ORAL | Status: AC
  Filled 2017-10-20: qty 2

## 2017-10-20 MED ORDER — BENAZEPRIL HCL 20 MG PO TABS
40.0000 mg | ORAL_TABLET | Freq: Every day | ORAL | Status: DC
  Administered 2017-10-21 – 2017-10-27 (×7): 40 mg via ORAL
  Filled 2017-10-20 (×7): qty 2

## 2017-10-20 MED ORDER — AMANTADINE HCL 100 MG PO CAPS: 100.0000 mg | ORAL_CAPSULE | Freq: Two times a day (BID) | ORAL | Status: DC

## 2017-10-20 MED ORDER — PALIPERIDONE ER 6 MG PO TB24: 9.0000 mg | ORAL_TABLET | Freq: Every day | ORAL | Status: DC

## 2017-10-20 MED ORDER — ALUM & MAG HYDROXIDE-SIMETH 200-200-20 MG/5ML PO SUSP: 30.0000 mL | ORAL | Status: DC | PRN

## 2017-10-20 MED ORDER — HYDROXYZINE HCL 50 MG PO TABS: 50.0000 mg | ORAL_TABLET | Freq: Three times a day (TID) | ORAL | Status: DC | PRN

## 2017-10-20 MED ORDER — PALIPERIDONE ER 3 MG PO TB24
9.0000 mg | ORAL_TABLET | Freq: Every day | ORAL | Status: DC
  Administered 2017-10-21 – 2017-10-27 (×7): 9 mg via ORAL
  Filled 2017-10-20 (×7): qty 3

## 2017-10-20 MED ORDER — INSULIN ASPART 100 UNIT/ML ~~LOC~~ SOLN
0.0000 [IU] | Freq: Three times a day (TID) | SUBCUTANEOUS | Status: DC
  Administered 2017-10-21: 2 [IU] via SUBCUTANEOUS
  Administered 2017-10-21: 3 [IU] via SUBCUTANEOUS
  Administered 2017-10-22 – 2017-10-23 (×2): 2 [IU] via SUBCUTANEOUS
  Administered 2017-10-25: 3 [IU] via SUBCUTANEOUS
  Administered 2017-10-26: 2 [IU] via SUBCUTANEOUS
  Administered 2017-10-27: 3 [IU] via SUBCUTANEOUS
  Filled 2017-10-20: qty 1

## 2017-10-20 MED ORDER — ALLOPURINOL 100 MG PO TABS
100.0000 mg | ORAL_TABLET | Freq: Every day | ORAL | Status: DC
  Administered 2017-10-21 – 2017-10-27 (×7): 100 mg via ORAL
  Filled 2017-10-20 (×7): qty 1

## 2017-10-20 NOTE — Plan of Care (Signed)
Patient is new admission   Not Progressing Education: Knowledge of [place] [facility] Education information/materials will improve 10/20/2017 2227 - Not Progressing by Addison Naegelieynolds, Epsie Walthall I, RN 10/20/2017 2227 - Not Progressing by Addison Naegelieynolds, Sanjay Broadfoot I, RN Emotional status will improve 10/20/2017 2227 - Not Progressing by Addison Naegelieynolds, Tino Ronan I, RN 10/20/2017 2227 - Not Progressing by Addison Naegelieynolds, Chandi Nicklin I, RN Mental status will improve 10/20/2017 2227 - Not Progressing by Addison Naegelieynolds, Nyellie Yetter I, RN 10/20/2017 2227 - Not Progressing by Addison Naegelieynolds, Kaelee Pfeffer I, RN Verbalization of understanding the information provided will improve 10/20/2017 2227 - Not Progressing by Addison Naegelieynolds, Jatin Naumann I, RN 10/20/2017 2227 - Not Progressing by Addison Naegelieynolds, Timmia Cogburn I, RN Coping: Ability to verbalize frustrations and anger appropriately will improve 10/20/2017 2227 - Not Progressing by Addison Naegelieynolds, Lucah Petta I, RN 10/20/2017 2227 - Not Progressing by Addison Naegelieynolds, Jamita Mckelvin I, RN Ability to demonstrate self-control will improve 10/20/2017 2227 - Not Progressing by Addison Naegelieynolds, Brandley Aldrete I, RN 10/20/2017 2227 - Not Progressing by Addison Naegelieynolds, Jelina Paulsen I, RN Safety: Periods of time without injury will increase 10/20/2017 2227 - Not Progressing by Addison Naegelieynolds, Eames Dibiasio I, RN 10/20/2017 2227 - Not Progressing by Berkley Harveyeynolds, Kenyatta Keidel I, RN

## 2017-10-20 NOTE — ED Provider Notes (Signed)
[facility] Emergency Department Provider Note  ____________________________________________   First MD Initiated Contact with Patient 10/20/17 1813     (approximate)  I have reviewed the triage vital signs and the nursing notes.   HISTORY  Chief Complaint IVC   HPI Walter Pena. is a 36 y.o. male with a history of schizophrenia as well as diabetes who is presenting to the emergency department with psychosis.  Per his IVC filled out by family he has been noncompliant with his medications over the past several months.  He has not been bathing and today ran out into the street and was pretending to shoot people.  He has only been minimally verbal here and so the patient was not able to give details about recent events.    Past Medical History:  Diagnosis Date  . Gout   . Hidradenitis   . Hypertension   . Schizophrenia [facility])     Patient Active Problem List   Diagnosis Date Noted  . Diabetes (HCC) 02/18/2017  . Schizophrenia (HCC) 02/17/2017  . Alcohol use disorder 02/17/2017  . Noncompliance 05/07/2016  . HTN (hypertension) 01/21/2016  . Cannabis use disorder, moderate, dependence (HCC) 01/21/2016  . Tobacco use disorder 01/21/2016  . Gout 01/21/2016  . Hidradenitis 01/21/2016    Past Surgical History:  Procedure Laterality Date  . sweat gland removal      Prior to Admission medications   Medication Sig Start Date End Date Taking? Authorizing Provider  allopurinol (ZYLOPRIM) 100 MG tablet Take 100 mg by mouth daily. 01/21/17   [provider]  amantadine (SYMMETREL) 100 MG capsule Take 1 capsule (100 mg total) by mouth 2 (two) times daily. 02/25/17   Jimmy Footman, MD  amLODipine (NORVASC) 10 MG tablet Take 10 mg by mouth daily. 01/21/17   [provider]  benazepril (LOTENSIN) 40 MG tablet Take 1 tablet by mouth daily. 01/21/17   [provider]  cephALEXin (KEFLEX) 500 MG capsule Take 1 capsule (500  mg total) by mouth 2 (two) times daily for 10 days. 10/10/17 10/20/17  Dionne Bucy, MD  colchicine 0.6 MG tablet 1 tablet by mouth daily until gout pain is gone. 04/24/16   Joni Reining, PA-C  haloperidol (HALDOL) 5 MG tablet Take 1 tablet (5 mg total) by mouth at bedtime. 10/10/17   Dionne Bucy, MD  oxyCODONE (ROXICODONE) 5 MG immediate release tablet Take 1 tablet (5 mg total) by mouth every 8 (eight) hours as needed. 03/05/17 03/05/18  Evon Slack, PA-C  paliperidone (INVEGA SUSTENNA) 234 MG/1.5ML SUSP injection Inject 234 mg into the muscle once. Due May 7 03/23/17 03/23/17  Jimmy Footman, MD  paliperidone (INVEGA) 6 MG 24 hr tablet Take 2 tablets (12 mg total) by mouth at bedtime. 02/25/17   Jimmy Footman, MD  predniSONE (DELTASONE) 10 MG tablet Take 1 tablet (10 mg total) by mouth daily. 6,5,4,3,2,1 six day taper 03/05/17   Evon Slack, PA-C  traZODone (DESYREL) 150 MG tablet Take 1 tablet (150 mg total) by mouth at bedtime. 02/26/17   Jimmy Footman, MD    Allergies Bactrim [sulfamethoxazole-trimethoprim]; Lisinopril; and Other  Family History  Problem Relation Age of Onset  . Diabetes Mellitus II Mother   . Hypertension Mother     Social History Social History   Tobacco Use  . Smoking status: Current Every Day Smoker    Packs/day: 1.00    Years: 9.00    Pack years: 9.00    Types:  Cigarettes  . Smokeless tobacco: Never Used  Substance Use Topics  . Alcohol use: Yes  . Drug use: Yes    Types: Marijuana    Review of Systems  Level 5 caveat secondary to patient psychosis.  ____________________________________________   PHYSICAL EXAM:  VITAL SIGNS: ED Triage Vitals  Enc Vitals Group     BP 10/20/17 1723 (!) 151/91     Pulse Rate 10/20/17 1723 100     Resp 10/20/17 1723 16     Temp 10/20/17 1723 98.9 F (37.2 C)     Temp Source 10/20/17 1723 Oral     SpO2 10/20/17 1723 97 %     Weight 10/20/17 1724 250 lb  (113.4 kg)     Height 10/20/17 1724 5\' 7"  (1.702 m)     Head Circumference --      Peak Flow --      Pain Score 10/20/17 1723 10     Pain Loc --      Pain Edu? --      Excl. in GC? --     Constitutional: Alert and in no acute distress. Eyes: Conjunctivae are normal.  Head: Atraumatic. Nose: No congestion/rhinnorhea. Mouth/Throat: Mucous membranes are moist.  Neck: No stridor.   Cardiovascular: Normal rate, regular rhythm. Grossly normal heart sounds.   Respiratory: Normal respiratory effort.  No retractions. Lungs CTAB. Gastrointestinal: Soft and nontender. No distention. Musculoskeletal: No lower extremity tenderness nor edema.  No joint effusions. Neurologic:  Normal speech and language. No gross focal neurologic deficits are appreciated. Skin:  Skin is warm, dry and intact. No rash noted. Psychiatric: Flat affect.  Patient minimally answers questions although is awake and alert.  Sometimes says "yes "to questioning but it is difficult to get any detailed response.  ____________________________________________   LABS (all labs ordered are listed, but only abnormal results are displayed)  Labs Reviewed  COMPREHENSIVE METABOLIC PANEL - Abnormal; Notable for the following components:      Result Value   Potassium 3.2 (*)    Glucose, Bld 130 (*)    Total Protein 9.2 (*)    All other components within normal limits  ACETAMINOPHEN LEVEL - Abnormal; Notable for the following components:   Acetaminophen (Tylenol), Serum <10 (*)    All other components within normal limits  CBC - Abnormal; Notable for the following components:   WBC 17.4 (*)    HCT 39.6 (*)    RDW 15.8 (*)    All other components within normal limits  ETHANOL  SALICYLATE LEVEL  URINE DRUG SCREEN, QUALITATIVE ([facility] ONLY)    ____________________________________________  EKG   ____________________________________________  RADIOLOGY   ____________________________________________   PROCEDURES  Procedure(s) performed:   Procedures  Critical Care performed:   ____________________________________________   INITIAL IMPRESSION / ASSESSMENT AND PLAN / ED COURSE  Pertinent labs & imaging results that were available during my care of the patient were reviewed by me and considered in my medical decision making (see chart for details).  DDX: Psychosis, renal failure, UTI, polysubstance abuse, medication noncompliance, schizophrenia  As part of my medical decision making, I reviewed the following data within the electronic MEDICAL RECORD NUMBER Notes from prior ED visits  ----------------------------------------- 6:44 PM on 10/20/2017 -----------------------------------------  IVC held by me.  Patient seen by Dr. Toni Amend who is recommending admission.        ____________________________________________   FINAL CLINICAL IMPRESSION(S) / ED DIAGNOSES  Psychosis    NEW MEDICATIONS STARTED DURING THIS VISIT:  This SmartLink  is deprecated. Use AVSMEDLIST instead to display the medication list for a patient.   Note:  This document was prepared using Dragon voice recognition software and may include unintentional dictation errors.      Myrna BlazerSchaevitz, David Matthew, MD 10/20/17 [phone]

## 2017-10-20 NOTE — Consult Note (Signed)
[facility] Face-to-Face Psychiatry Consult   Reason for Consult: Consult for 36 year old man with a history of schizophrenia brought in under involuntary commitment papers Referring Physician:  Engineer, maintenance (IT)chaevitz Patient Identification: Walter Ehlersharles L Powe Jr. MRN:  [id number] Principal Diagnosis: Schizophrenia [facility](HCC) Diagnosis:   Patient Active Problem List   Diagnosis Date Noted  . Diabetes (HCC) [E11.9] 02/18/2017  . Schizophrenia (HCC) [F20.9] 02/17/2017  . Alcohol use disorder [IMO0002] 02/17/2017  . Noncompliance [Z91.19] 05/07/2016  . HTN (hypertension) [I10] 01/21/2016  . Cannabis use disorder, moderate, dependence (HCC) [F12.20] 01/21/2016  . Tobacco use disorder [F17.200] 01/21/2016  . Gout [M10.9] 01/21/2016  . Hidradenitis [L73.2] 01/21/2016    Total Time spent with patient: 1 hour  Subjective:   Walter Ehlersharles L Labrake Jr. is a 36 y.o. male patient admitted with patient is not currently verbal.  HPI: 36 year old man with a history of schizophrenia and multiple medical problems.  Brought in by law enforcement under involuntary commitment papers.  Commitment paperwork filed by his aunt reports that the patient has been acting bizarrely.  Describes him as walking in the middle of the street inappropriately dressed pretending that he is shooting it people.  Claims that he had been off of his medication for probably a few months.  [facility] and it is not really clear whether he got his injection or not.  Patient was passively cooperative with being brought into the emergency room but made no eye contact with me and was not verbal.  Did not respond to any of my questions either verbally or with any gestures.  Does not appear to be in any physical distress.  Social history: Patient is not married.  He has extended family who usually look out for him.  Usually stays with either his mother or aunt.  Medical history: Several medical problems including diabetes hypertension gout history of  hidradenitis is with significant skin problems.  Substance abuse history: Intermittent use of marijuana and alcohol which usually makes his symptoms acutely worse.  Past Psychiatric History: Patient has been seen several times previously in the hospital for exacerbations of psychotic disorder.  Established history of noncompliance.  No history of suicide attempts or violence.  Risk to Self: Is patient at risk for suicide?: No Risk to Others:   Prior Inpatient Therapy:   Prior Outpatient Therapy:    Past Medical History:  Past Medical History:  Diagnosis Date  . Gout   . Hidradenitis   . Hypertension   . Schizophrenia [facility](HCC)     Past Surgical History:  Procedure Laterality Date  . sweat gland removal     Family History:  Family History  Problem Relation Age of Onset  . Diabetes Mellitus II Mother   . Hypertension Mother    Family Psychiatric  History: Negative Social History:  Social History   Substance and Sexual Activity  Alcohol Use Yes     Social History   Substance and Sexual Activity  Drug Use Yes  . Types: Marijuana    Social History   Socioeconomic History  . Marital status: Single    Spouse name: None  . Number of children: None  . Years of education: None  . Highest education level: None  Social Needs  . Financial resource strain: None  . Food insecurity - worry: None  . Food insecurity - inability: None  . Transportation needs - medical: None  . Transportation needs - non-medical: None  Occupational History  . None  Tobacco Use  .  Smoking status: Current Every Day Smoker    Packs/day: 1.00    Years: 9.00    Pack years: 9.00    Types: Cigarettes  . Smokeless tobacco: Never Used  Substance and Sexual Activity  . Alcohol use: Yes  . Drug use: Yes    Types: Marijuana  . Sexual activity: None  Other Topics Concern  . None  Social History Narrative  . None   Additional Social History:    Allergies:   Allergies  Allergen Reactions  .  Bactrim [Sulfamethoxazole-Trimethoprim] Nausea And Vomiting  . Lisinopril Swelling  . Other Rash    aloe    Labs:  Results for orders placed or performed during the hospital encounter of 10/20/17 (from the past 48 hour(s))  Ethanol     Status: None   Collection Time: 10/20/17  5:35 PM  Result Value Ref Range   Alcohol, Ethyl (B) <10 <10 mg/dL    Comment:        LOWEST DETECTABLE LIMIT FOR SERUM ALCOHOL IS 10 mg/dL FOR MEDICAL PURPOSES ONLY   Salicylate level     Status: None   Collection Time: 10/20/17  5:35 PM  Result Value Ref Range   Salicylate Lvl <7.0 2.8 - 30.0 mg/dL  Acetaminophen level     Status: Abnormal   Collection Time: 10/20/17  5:35 PM  Result Value Ref Range   Acetaminophen (Tylenol), Serum <10 (L) 10 - 30 ug/mL    Comment:        THERAPEUTIC CONCENTRATIONS VARY SIGNIFICANTLY. A RANGE OF 10-30 ug/mL MAY BE AN EFFECTIVE CONCENTRATION FOR MANY PATIENTS. HOWEVER, SOME ARE BEST TREATED AT CONCENTRATIONS OUTSIDE THIS RANGE. ACETAMINOPHEN CONCENTRATIONS >150 ug/mL AT 4 HOURS AFTER INGESTION AND >50 ug/mL AT 12 HOURS AFTER INGESTION ARE OFTEN ASSOCIATED WITH TOXIC REACTIONS.   cbc     Status: Abnormal   Collection Time: 10/20/17  5:35 PM  Result Value Ref Range   WBC 17.4 (H) 3.8 - 10.6 K/uL   RBC 4.52 4.40 - 5.90 MIL/uL   Hemoglobin 13.0 13.0 - 18.0 g/dL   HCT 16.1 (L) 09.6 - 04.5 %   MCV 87.7 80.0 - 100.0 fL   MCH 28.8 26.0 - 34.0 pg   MCHC 32.8 32.0 - 36.0 g/dL   RDW 40.9 (H) 81.1 - 91.4 %   Platelets 334 150 - 440 K/uL    No current facility-administered medications for this encounter.    Current Outpatient Medications  Medication Sig Dispense Refill  . allopurinol (ZYLOPRIM) 100 MG tablet Take 100 mg by mouth daily.    Marland Kitchen amantadine (SYMMETREL) 100 MG capsule Take 1 capsule (100 mg total) by mouth 2 (two) times daily. 60 capsule 0  . amLODipine (NORVASC) 10 MG tablet Take 10 mg by mouth daily.    . benazepril (LOTENSIN) 40 MG tablet Take 1  tablet by mouth daily.    . cephALEXin (KEFLEX) 500 MG capsule Take 1 capsule (500 mg total) by mouth 2 (two) times daily for 10 days. 20 capsule 0  . colchicine 0.6 MG tablet 1 tablet by mouth daily until gout pain is gone. 15 tablet 0  . haloperidol (HALDOL) 5 MG tablet Take 1 tablet (5 mg total) by mouth at bedtime. 30 tablet 0  . oxyCODONE (ROXICODONE) 5 MG immediate release tablet Take 1 tablet (5 mg total) by mouth every 8 (eight) hours as needed. 15 tablet 0  . paliperidone (INVEGA SUSTENNA) 234 MG/1.5ML SUSP injection Inject 234 mg into the muscle once.  Due May 7 1.5 mL 0  . paliperidone (INVEGA) 6 MG 24 hr tablet Take 2 tablets (12 mg total) by mouth at bedtime. 60 tablet 0  . predniSONE (DELTASONE) 10 MG tablet Take 1 tablet (10 mg total) by mouth daily. 6,5,4,3,2,1 six day taper 21 tablet 0  . traZODone (DESYREL) 150 MG tablet Take 1 tablet (150 mg total) by mouth at bedtime. 30 tablet 0    Musculoskeletal: Strength & Muscle Tone: within normal limits Gait & Station: normal Patient leans: N/A  Psychiatric Specialty Exam: Physical Exam  Nursing note and vitals reviewed. Constitutional: He appears well-developed and well-nourished.  HENT:  Head: Normocephalic and atraumatic.  Eyes: Conjunctivae are normal. Pupils are equal, round, and reactive to light.  Neck: Normal range of motion.  Cardiovascular: Regular rhythm and normal heart sounds.  Respiratory: Effort normal. No respiratory distress.  GI: Soft.  Musculoskeletal: Normal range of motion.  Neurological: He is alert.  Skin: Skin is warm and dry.  Psychiatric: His affect is blunt. His speech is delayed. He is slowed and withdrawn. Cognition and memory are impaired. He expresses inappropriate judgment. He expresses no homicidal ideation.    Review of Systems  Unable to perform ROS: Psychiatric disorder    Blood pressure (!) 151/91, pulse 100, temperature 98.9 F (37.2 C), temperature source Oral, resp. rate 16, height  5\' 7"  (1.702 m), weight 250 lb (113.4 kg), SpO2 97 %.Body mass index is 39.16 kg/m.  General Appearance: Casual  Eye Contact:  None  Speech:  Negative  Volume:  Decreased  Mood:  Negative  Affect:  Flat  Thought Process:  NA  Orientation:  Negative  Thought Content:  Negative  Suicidal Thoughts:  No  Homicidal Thoughts:  No  Memory:  Negative  Judgement:  Negative  Insight:  Negative  Psychomotor Activity:  Negative  Concentration:  Concentration: Negative  Recall:  Negative  Fund of Knowledge:  Negative  Language:  Negative  Akathisia:  Negative  Handed:  Right  AIMS (if indicated):     Assets:  Resilience Social Support  ADL's:  Impaired  Cognition:  Impaired,  Mild  Sleep:        Treatment Plan Summary: Daily contact with patient to assess and evaluate symptoms and progress in treatment, Medication management and Plan 36 year old man with schizophrenia brought in under involuntary commitment.  Paperwork describes behavior consistent with his psychosis.  Patient is currently very passive did not make eye contact with me.  Seems to be having some significant thought blocking.  Because of known psychotic disorder with decompensation he is a appropriate candidate for inpatient treatment.  He was not able to answer the question as to whether he had received an injection of long-acting Invega yesterday.  Patient will be put back on appropriate medication consistent with what he was taking in the past.  Admission orders will be completed.  We will check his blood sugars and I will put in orders for regular blood sugar checks.  Restart medicines for diabetes gout hypertension and psychotic disorder.  Case reviewed with emergency room physician and TTS.  Continue IV C.  Disposition: Recommend psychiatric Inpatient admission when medically cleared. Supportive therapy provided about ongoing stressors.  Mordecai Rasmussen, MD 10/20/2017 6:20 PM

## 2017-10-20 NOTE — ED Notes (Signed)
Pt roomed; dressed out, sitting on bed. Pt mumbles "I don't want to talk" when this nurse begins assessment. Pt asked to answer yes or no to questions: Do you hear voices? No. Do you see things that are not there? No. Do you want to hurt yourself? No answer. Have you been drinking or doing drugs today? No.  Pt alert; NAD noted.

## 2017-10-20 NOTE — Progress Notes (Addendum)
D: Received patient from ED. Patient skin assessment completed with ABI RN, skin is intact, contraband found.   Contraband found during search of patient belongs. Small baggy of white crystal like substance. Nursing supervisor contacted and at 2342 contraband was disposed by flushing by Jill Alexanders MHT,was witnessed by this nurse.   Skin is also dry and flaky. Patient is a low fall risk. Patient denies SI/HI/AVH. Patient is reported to have ran into traffic and using hands to "shoot at people" by making "finger guns." Patient is mildly confused about location and time. Patient is unwilling to participate at certain points during the assessment.  A: Patient oriented to unit/room/call light. Patient was offered support and encouragement. Patient was encourage to attend groups, participate in unit activities and continue with plan of care. Q x 15 minute observation checks were completed for safety.   R: Patient has no complaints at this time. Patient is receptive to treatment and safety maintained on unit.

## 2017-10-20 NOTE — ED Triage Notes (Signed)
Pt to Ed under IVC after having walked into street barefoot and pretending to shoot people. Pt is also reported to have a hx of schizophrenia and has not been compliment with medications. PT is not answering all questions in triage and verbalized to this RN, "stop asking questions you are giving me a headache."   Pt does denies HI, drug use and alcohol use but does not answer when asked about SI or hallucinations.   Pt also reports he has not been sleeping like normal.

## 2017-10-20 NOTE — BH Assessment (Signed)
[facility] Attending Physician will be Dr. Mikey CollegeMcKnew.   [facility] Abbi

## 2017-10-20 NOTE — ED Notes (Signed)
This tech provided meal tray to pt per request.

## 2017-10-20 NOTE — Plan of Care (Signed)
Patient is new admission

## 2017-10-20 NOTE — BH Assessment (Signed)
Assessment Note  Walter Pena. is an 36 y.o. male who presents to the ER via law enforcement due to walking in the street, pointing his fingers at oncoming traffic as if he was shooting him with the gun.  Patient is well known to the ER due to similar presentation. History of schizophrenia and noncompliant with medications. He receives treatment with [facility] and it's unknown when he last seen them.  During the interview, the patient was quiet and didn't talk much to the staff. He was also responding to internal stimuli while Clinical research associate was interviewing him.  Diagnosis: Schizophrenia  Past Medical History:  Past Medical History:  Diagnosis Date  . Gout   . Hidradenitis   . Hypertension   . Schizophrenia [facility])     Past Surgical History:  Procedure Laterality Date  . sweat gland removal      Family History:  Family History  Problem Relation Age of Onset  . Diabetes Mellitus II Mother   . Hypertension Mother     Social History:  reports that he has been smoking cigarettes.  He has a 9.00 pack-year smoking history. he has never used smokeless tobacco. He reports that he drinks alcohol. He reports that he uses drugs. Drug: Marijuana.  Additional Social History:  Alcohol / Drug Use Pain Medications: See PTA Prescriptions: See PTA Over the Counter: See PTA History of alcohol / drug use?: Yes Negative Consequences of Use: Legal Substance #1 Name of Substance 1: marijuana 1 - Amount (size/oz): "20 dollars worth a day" 1 - Last Use / Amount: Unknown, patient would not talk.  CIWA: CIWA-Ar BP: (!) 151/91 Pulse Rate: 100 COWS:    Allergies:  Allergies  Allergen Reactions  . Bactrim [Sulfamethoxazole-Trimethoprim] Nausea And Vomiting  . Lisinopril Swelling  . Other Rash    aloe    Home Medications:  (Not in a hospital admission)  OB/GYN Status:  No LMP for male patient.  General Assessment Data Location of Assessment: [facility] ED TTS Assessment: In system Is this a Tele or  Face-to-Face Assessment?: Face-to-Face Is this an Initial Assessment or a Re-assessment for this encounter?: Initial Assessment Marital status: Single Maiden name: n/a Is patient pregnant?: No Pregnancy Status: No Living Arrangements: Other relatives Can pt return to current living arrangement?: Yes Admission Status: Involuntary Is patient capable of signing voluntary admission?: No(Under IVC) Referral Source: Self/Family/Friend Insurance type: Medicare  Medical Screening Exam [facility] Walk-in ONLY) Medical Exam completed: Yes  Crisis Care Plan Living Arrangements: Other relatives Legal Guardian: Other:(Self) Name of Psychiatrist: [facility] Psychiatrist Name of Therapist: [facility]  Education Status Is patient currently in school?: No Current Grade: n/a Highest grade of school patient has completed: n/a Name of school: n/a Contact person: n/a  Risk to self with the past 6 months Suicidal Ideation: No Has patient been a risk to self within the past 6 months prior to admission? : No Suicidal Intent: No Has patient had any suicidal intent within the past 6 months prior to admission? : No Is patient at risk for suicide?: No Suicidal Plan?: No Has patient had any suicidal plan within the past 6 months prior to admission? : No Access to Means: No What has been your use of drugs/alcohol within the last 12 months?: UTA, patient would not talk Previous Attempts/Gestures: No How many times?: 0 Other Self Harm Risks: None reports Triggers for Past Attempts: None known Intentional Self Injurious Behavior: None Family Suicide History: No Recent stressful life event(s): Other (Comment)(Non-compliant with medications)  Persecutory voices/beliefs?: No Depression: Yes Depression Symptoms: Despondent, Feeling worthless/self pity, Isolating Substance abuse history and/or treatment for substance abuse?: No Suicide prevention information given to non-admitted patients: Not applicable  Risk to Others  within the past 6 months Homicidal Ideation: No Does patient have any lifetime risk of violence toward others beyond the six months prior to admission? : No Thoughts of Harm to Others: No Current Homicidal Intent: No Current Homicidal Plan: No Access to Homicidal Means: No Identified Victim: n/a History of harm to others?: No Assessment of Violence: None Noted Violent Behavior Description: Reports of none Does patient have access to weapons?: No Criminal Charges Pending?: No Does patient have a court date: No Is patient on probation?: No  Psychosis Hallucinations: Auditory, Visual(Responding to internal stimuli) Delusions: Unspecified  Mental Status Report Appearance/Hygiene: Poor hygiene, Body odor Eye Contact: Fair Motor Activity: Freedom of movement, Unremarkable Speech: Elective mutism Level of Consciousness: Alert Mood: Suspicious, Labile, Pleasant Affect: Blunted, Flat Anxiety Level: None Thought Processes: Thought Blocking Judgement: Impaired Orientation: Person Obsessive Compulsive Thoughts/Behaviors: None  Cognitive Functioning Concentration: Decreased Memory: Unable to Assess IQ: Average Insight: Unable to Assess Impulse Control: Unable to Assess Appetite: Fair(UTA, patient is an adult) Weight Loss: 0 Weight Gain: 0 Sleep: Decreased(Patient reports of lack of sleep.) Total Hours of Sleep: 0 Vegetative Symptoms: None  ADLScreening [facility] Assessment Services) Patient's cognitive ability adequate to safely complete daily activities?: Yes Patient able to express need for assistance with ADLs?: Yes Independently performs ADLs?: Yes (appropriate for developmental age)  Prior Inpatient Therapy Prior Inpatient Therapy: Yes Prior Therapy Dates: 02/2017, 2017, 2012 Prior Therapy Facilty/Provider(s): [facility], [facility] Reason for Treatment: psychosis  Prior Outpatient Therapy Prior Outpatient Therapy: Yes Prior Therapy Dates: current Prior Therapy  Facilty/Provider(s): [facility] Reason for Treatment: psychosis Does patient have an ACCT team?: No Does patient have Intensive In-House Services?  : No Does patient have [facility] services? : No Does patient have P4CC services?: No  ADL Screening (condition at time of admission) Patient's cognitive ability adequate to safely complete daily activities?: Yes Is the patient deaf or have difficulty hearing?: No Does the patient have difficulty seeing, even when wearing glasses/contacts?: No Does the patient have difficulty concentrating, remembering, or making decisions?: No Patient able to express need for assistance with ADLs?: Yes Does the patient have difficulty dressing or bathing?: No Independently performs ADLs?: Yes (appropriate for developmental age) Does the patient have difficulty walking or climbing stairs?: No Weakness of Legs: None Weakness of Arms/Hands: None  Home Assistive Devices/Equipment Home Assistive Devices/Equipment: None  Therapy Consults (therapy consults require a physician order) PT Evaluation Needed: No OT Evalulation Needed: No SLP Evaluation Needed: No Abuse/Neglect Assessment (Assessment to be complete while patient is alone) Abuse/Neglect Assessment Can Be Completed: Yes Physical Abuse: Denies Verbal Abuse: Denies Sexual Abuse: Denies Exploitation of patient/patient's resources: Denies Self-Neglect: Denies Values / Beliefs Cultural Requests During Hospitalization: None Spiritual Requests During Hospitalization: None Consults Spiritual Care Consult Needed: No Social Work Consult Needed: No Merchant navy officerAdvance Directives (For Healthcare) Does Patient Have a Medical Advance Directive?: No Would patient like information on creating a medical advance directive?: No - Patient declined    Additional Information 1:1 In Past 12 Months?: No CIRT Risk: No Elopement Risk: No Does patient have medical clearance?: Yes  Child/Adolescent Assessment Running Away Risk:  Denies(Patient is an adult)  Disposition:  Disposition Initial Assessment Completed for this Encounter: Yes Disposition of Patient: Inpatient treatment program(Per Dr. Toni Amendlapacs)  On Site Evaluation by:  Reviewed with Physician:    Lilyan Gilford MS, LCAS, LPC, NCC, CCSI Therapeutic Triage Specialist 10/20/2017 8:17 PM

## 2017-10-20 NOTE — ED Notes (Signed)
Meal given to Pt per his request.

## 2017-10-20 NOTE — Tx Team (Signed)
Initial Treatment Plan 10/20/2017 10:24 PM Walter Pena. OMV:[id number]    PATIENT STRESSORS: Financial difficulties Health problems Legal issue Occupational concerns Substance abuse Traumatic event   PATIENT STRENGTHS: Active sense of humor Communication skills Work skills   PATIENT IDENTIFIED PROBLEMS: Ineffective coping skills 10/20/17  Mood instability 10/20/17  Confusion 10/20/17                 DISCHARGE CRITERIA:  Ability to meet basic life and health needs Adequate post-discharge living arrangements Improved stabilization in mood, thinking, and/or behavior Verbal commitment to aftercare and medication compliance  PRELIMINARY DISCHARGE PLAN: Attend aftercare/continuing care group Placement in alternative living arrangements  PATIENT/FAMILY INVOLVEMENT: This treatment plan has been presented to and reviewed with the patient, Walter Pena.  The patient and family have been given the opportunity to ask questions and make suggestions.  Berkley Harvey, RN 10/20/2017, 10:24 PM

## 2017-10-20 NOTE — ED Notes (Signed)
Patient assigned to appropriate care area. Patient oriented to unit/care area: Informed that, for their safety, care areas are designed for safety and monitored by security cameras at all times; and visiting hours explained to patient. Patient verbalizes understanding, and verbal contract for safety obtained. 

## 2017-10-21 DIAGNOSIS — F203 Undifferentiated schizophrenia: Principal | ICD-10-CM

## 2017-10-21 LAB — URINALYSIS, COMPLETE (UACMP) WITH MICROSCOPIC
Bacteria, UA: NONE SEEN
Bilirubin Urine: NEGATIVE
GLUCOSE, UA: NEGATIVE mg/dL
HGB URINE DIPSTICK: NEGATIVE
Ketones, ur: NEGATIVE mg/dL
Nitrite: NEGATIVE
PROTEIN: NEGATIVE mg/dL
SPECIFIC GRAVITY, URINE: 1.003 — AB (ref 1.005–1.030)
pH: 7 (ref 5.0–8.0)

## 2017-10-21 LAB — HEMOGLOBIN A1C
HEMOGLOBIN A1C: 6.2 % — AB (ref 4.8–5.6)
Mean Plasma Glucose: 131.24 mg/dL

## 2017-10-21 LAB — GLUCOSE, CAPILLARY
GLUCOSE-CAPILLARY: 98 mg/dL (ref 65–99)
GLUCOSE-CAPILLARY: 174 mg/dL — AB (ref 65–99)
GLUCOSE-CAPILLARY: 122 mg/dL — AB (ref 65–99)

## 2017-10-21 LAB — LIPID PANEL
Cholesterol: 116 mg/dL (ref 0–200)
HDL: 33 mg/dL — ABNORMAL LOW (ref >40)
LDL CALC: 72 mg/dL (ref 0–99)
Total CHOL/HDL Ratio: 3.5 RATIO
Triglycerides: 54 mg/dL (ref <150)
VLDL: 11 mg/dL (ref 0–40)

## 2017-10-21 MED ORDER — POTASSIUM CHLORIDE CRYS ER 20 MEQ PO TBCR
40.0000 meq | EXTENDED_RELEASE_TABLET | Freq: Once | ORAL | Status: DC
Start: 2017-10-21 — End: 2017-10-22
  Filled 2017-10-21: qty 2

## 2017-10-21 MED ORDER — CIPROFLOXACIN HCL 500 MG PO TABS
500.0000 mg | ORAL_TABLET | Freq: Two times a day (BID) | ORAL | Status: DC
  Administered 2017-10-22 – 2017-10-27 (×11): 500 mg via ORAL
  Filled 2017-10-21 (×13): qty 1

## 2017-10-21 MED ORDER — PALIPERIDONE PALMITATE 156 MG/ML IM SUSP: 156.0000 mg | Freq: Once | INTRAMUSCULAR | Status: DC

## 2017-10-21 MED ORDER — HALOPERIDOL 5 MG PO TABS
5.0000 mg | ORAL_TABLET | Freq: Every day | ORAL | Status: DC
  Administered 2017-10-21 – 2017-10-26 (×6): 5 mg via ORAL
  Filled 2017-10-21 (×6): qty 1

## 2017-10-21 MED ORDER — PALIPERIDONE PALMITATE 156 MG/ML IM SUSP
156.0000 mg | Freq: Once | INTRAMUSCULAR | Status: DC
  Filled 2017-10-21: qty 1

## 2017-10-21 MED ORDER — TRAZODONE HCL 50 MG PO TABS: 150.0000 mg | ORAL_TABLET | Freq: Every evening | ORAL | Status: DC | PRN

## 2017-10-21 NOTE — Progress Notes (Signed)
Patient is oriented x2, Person and situation. Patient will not cooperate with obtaining EKG at this time. Patient will not remain still enough to capture EKG at this time. Will report off to on coming shift.

## 2017-10-21 NOTE — [facility] Suicide Risk Assessment (Signed)
[facility] Admission Suicide Risk Assessment   Nursing information obtained from:  Patient Demographic factors:  Male, Unemployed, Living alone, Low socioeconomic status Current Mental Status:  NA Loss Factors:  NA Historical Factors:  NA Risk Reduction Factors:  NA  Total Time spent with patient: 1 hour Principal Problem: Schizophrenia, undifferentiated (HCC) Diagnosis:   Patient Active Problem List   Diagnosis Date Noted  . Schizophrenia, undifferentiated (HCC) [F20.3] 10/20/2017  . Diabetes (HCC) [E11.9] 02/18/2017  . Schizophrenia (HCC) [F20.9] 02/17/2017  . Alcohol use disorder [IMO0002] 02/17/2017  . Noncompliance [Z91.19] 05/07/2016  . HTN (hypertension) [I10] 01/21/2016  . Cannabis use disorder, moderate, dependence (HCC) [F12.20] 01/21/2016  . Tobacco use disorder [F17.200] 01/21/2016  . Gout [M10.9] 01/21/2016  . Hidradenitis [L73.2] 01/21/2016   Subjective Data: See H&P  Continued Clinical Symptoms:  Alcohol Use Disorder Identification Test Final Score (AUDIT): 0 The "Alcohol Use Disorders Identification Test", Guidelines for Use in Primary Care, Second Edition.  World Science writer [facility]). Score between 0-7:  no or low risk or alcohol related problems. Score between 8-15:  moderate risk of alcohol related problems. Score between 16-19:  high risk of alcohol related problems. Score 20 or above:  warrants further diagnostic evaluation for alcohol dependence and treatment.   CLINICAL FACTORS:   Schizophrenia:   Less than 36 years old Paranoid or undifferentiated type   Musculoskeletal: Strength & Muscle Tone: within normal limits Gait & Station: normal Patient leans: N/A  Psychiatric Specialty Exam: Physical Exam  ROS  Blood pressure (!) 178/98, pulse 98, temperature 98.2 F (36.8 C), temperature source Oral, resp. rate 18, height 5\' 8"  (1.727 m), weight 107 kg (236 lb), SpO2 100 %.Body mass index is 35.88 kg/m.                                                           COGNITIVE FEATURES THAT CONTRIBUTE TO RISK:  Loss of executive function and Thought constriction (tunnel vision)    SUICIDE RISK:   Mild:  Suicidal ideation of limited frequency, intensity, duration, and specificity.  There are no identifiable plans, no associated intent, mild dysphoria and related symptoms, good self-control (both objective and subjective assessment), few other risk factors, and identifiable protective factors, including available and accessible social support.  PLAN OF CARE: See H&P  I certify that inpatient services furnished can reasonably be expected to improve the patient's condition.   Haskell Riling, MD 10/21/2017, 4:10 PM

## 2017-10-21 NOTE — [facility] Group Notes (Signed)
LCSW Group Therapy Note  10/21/2017 1:15pm  Type of Therapy/Topic:  Group Therapy:  Emotion Regulation  Participation Level:  None   Description of Group:   The purpose of this group is to assist patients in learning to regulate negative emotions and experience positive emotions. Patients will be guided to discuss ways in which they have been vulnerable to their negative emotions. These vulnerabilities will be juxtaposed with experiences of positive emotions or situations, and patients will be challenged to use positive emotions to combat negative ones. Special emphasis will be placed on coping with negative emotions in conflict situations, and patients will process healthy conflict resolution skills.  Therapeutic Goals: 1. Patient will identify two positive emotions or experiences to reflect on in order to balance out negative emotions 2. Patient will label two or more emotions that they find the most difficult to experience 3. Patient will demonstrate positive conflict resolution skills through discussion and/or role plays  Summary of Patient Progress:   Pt attended but fell asleep in group and with little participation. Only saying that he was very tired from his medications.    Therapeutic Modalities:   Cognitive Behavioral Therapy Feelings Identification Dialectical Behavioral Therapy   Glennon Mac, LCSW 10/21/2017 1:50 PM

## 2017-10-21 NOTE — [facility] Group Notes (Signed)
[facility] Group Notes:  (Nursing/MHT/Case Management/Adjunct)  Date:  10/21/2017  Time:  10:13 PM  Type of Therapy:  Group Therapy  Participation Level:  Did Not Attend  Participation QualityJackie L Monserrath Pena 10/21/2017, 10:13 PM 

## 2017-10-21 NOTE — [facility] Suicide Risk Assessment (Signed)
[facility] INPATIENT:  Family/Significant Other Suicide Prevention Education  Suicide Prevention Education:  Education Completed; Dionne Bucy, aunt, [phone], has been identified by the patient as the family member/significant other with whom the patient will be residing, and identified as the person(s) who will aid the patient in the event of a mental health crisis (suicidal ideations/suicide attempt).  With written consent from the patient, the family member/significant other has been provided the following suicide prevention education, prior to the and/or following the discharge of the patient.  The suicide prevention education provided includes the following:  Suicide risk factors  Suicide prevention and interventions  National Suicide Hotline telephone number  [facility] assessment telephone number  [facility] Emergency Assistance 911  [facility] and/or Residential Mobile Crisis Unit telephone number  Request made of family/significant other to:  Remove weapons (e.g., guns, rifles, knives), all items previously/currently identified as safety concern.  No guns that [place] Lions is aware of.  Remove drugs/medications (over-the-counter, prescriptions, illicit drugs), all items previously/currently identified as a safety concern.  The family member/significant other verbalizes understanding of the suicide prevention education information provided.  The family member/significant other agrees to remove the items of safety concern listed above.  [place] Lions initiated the IVC that led to pt admission.  Pt never began ACT team services after last admission.  Pt did go to [facility] but had not had his monthly injection for the past 5 months.  She took him several days ago and he did get his injection.   Lions said he "got worse" after that shot.  He was supposed to return for a second injection on 10/27/17.  Pt has been acting bizarre for past week: won't take baths, talking to himself, walking  down the middle of the street.  No suicidal talk that she is aware of.  She does check in with pt regularly and does think he needs ACT team if he is going to keep living independently.  Lorri Frederick, LCSW 10/21/2017, 2:44 PM

## 2017-10-21 NOTE — H&P (Addendum)
Psychiatric Admission Assessment Adult  Patient Identification: Walter Pena. MRN:  [id number] Date of Evaluation:  10/21/2017 Chief Complaint:  schizophrenia Principal Diagnosis: Schizophrenia, undifferentiated (HCC) Diagnosis:   Patient Active Problem List   Diagnosis Date Noted  . Schizophrenia, undifferentiated (HCC) [F20.3] 10/20/2017  . Diabetes (HCC) [E11.9] 02/18/2017  . Schizophrenia (HCC) [F20.9] 02/17/2017  . Alcohol use disorder [IMO0002] 02/17/2017  . Noncompliance [Z91.19] 05/07/2016  . HTN (hypertension) [I10] 01/21/2016  . Cannabis use disorder, moderate, dependence (HCC) [F12.20] 01/21/2016  . Tobacco use disorder [F17.200] 01/21/2016  . Gout [M10.9] 01/21/2016  . Hidradenitis [L73.2] 01/21/2016   History of Present Illness: 36 yo male with history of schizophrenia presented due to bizarre behaviors. He was brought in by Patent examiner. Per his aunt, he has been acting bizarrely. He has been walking in the middle of the street inappropriately dressed pretending that he is shooting people. He has been off medications for a few months. He went to [facility] yesterday and did receive his Invega injection. He was responding to internal stimuli in ED and mostly nonverbal.   Upon evaluation today, pt is confused and mumbling incoherently most of the time. He is not oriented to place or situation. When asked where [place] When asked why he is here, he states, 'I was on the way up or way out." when told that he was pretending to shoot with his fingers he's states, "That's what everyone dose. I have poison oak." He is oriented to Month and year but after several seconds to think about it. He then states, "I need to get the kids Christmas presents." When asked who the president is, he states, "Tamela Gammon." HE is aware that he is in [place]. He states that he has been off medications for "a couple months." Pt does report that he has diagnosis of schizophrenia.  He states that he has not been sleeping well. He denies SI or HI. He denies feeling depressed. HE is not able to answer further questions coherently. He was seen in ED on 11/23. His aunt at that time stated that he was not cooperating and has been off medication and not making sense. His aunt also stated that he has not slept in 3-4 days. He has also been missing appointments at [facility]. He seemed to improve and was discharged.   Per chart review, pt was inpatient in April for similar symptoms. When he is off medications, he decompensates and begins to walk all over the place and gets angry. This has happened a number of times in the past. HE has been in several group homes in the past. He is on disability.  Associated Signs/Symptoms: Depression Symptoms: He denies depression symptoms (Hypo) Manic Symptoms:  Not sleeping Anxiety Symptoms:  Unable to assess Psychotic Symptoms:  Unable to assess due to disorganization PTSD Symptoms: Negative Total Time spent with patient: 1 hour  Past Psychiatric History: History of schizophrenia and medication noncompliance. No history of suicide attempts.  Is the patient at risk to self? Yes.    Has the patient been a risk to self in the past 6 months? No.  Has the patient been a risk to self within the distant past? No.  Is the patient a risk to others? No.  Has the patient been a risk to others in the past 6 months? No.  Has the patient been a risk to others within the distant past? No.   Alcohol Screening: Patient refused Alcohol Screening  Tool: Yes 1. How often do you have a drink containing alcohol?: Never 2. How many drinks containing alcohol do you have on a typical day when you are drinking?: 1 or 2 3. How often do you have six or more drinks on one occasion?: Never AUDIT-C Score: 0 4. How often during the last year have you found that you were not able to stop drinking once you had started?: Never 5. How often during the last year have you failed to do  what was normally expected from you becasue of drinking?: Never 6. How often during the last year have you needed a first drink in the morning to get yourself going after a heavy drinking session?: Never 7. How often during the last year have you had a feeling of guilt of remorse after drinking?: Never 8. How often during the last year have you been unable to remember what happened the night before because you had been drinking?: Never 9. Have you or someone else been injured as a result of your drinking?: No 10. Has a relative or friend or a doctor or another health worker been concerned about your drinking or suggested you cut down?: No Alcohol Use Disorder Identification Test Final Score (AUDIT): 0 Intervention/Follow-up: Patient Refused Substance Abuse History in the last 12 months:  Yes.  , marijuana Consequences of Substance Abuse: Negative Previous Psychotropic Medications: Yes  Psychological Evaluations: Yes  Past Medical History:  Past Medical History:  Diagnosis Date  . Gout   . Hidradenitis   . Hypertension   . Schizophrenia [facility])     Past Surgical History:  Procedure Laterality Date  . sweat gland removal     Family History:  Family History  Problem Relation Age of Onset  . Diabetes Mellitus II Mother   . Hypertension Mother    Family Psychiatric  History: Unknown Tobacco Screening: Have you used any form of tobacco in the last 30 days? (Cigarettes, Smokeless Tobacco, Cigars, and/or Pipes): No Social History:  Social History   Substance and Sexual Activity  Alcohol Use Yes     Social History   Substance and Sexual Activity  Drug Use Yes  . Types: Marijuana    Additional Social History: Pt lives alone in an apartment.  Marital status: Single Are you sexually active?: Yes What is your sexual orientation?: heterosexual Has your sexual activity been affected by drugs, alcohol, medication, or emotional stress?: no Does patient have children?: No                          Allergies:   Allergies  Allergen Reactions  . Bactrim [Sulfamethoxazole-Trimethoprim] Nausea And Vomiting  . Lisinopril Swelling  . Other Rash    aloe   Lab Results:  Results for orders placed or performed during the hospital encounter of 10/20/17 (from the past 48 hour(s))  Hemoglobin A1c     Status: Abnormal   Collection Time: 10/21/17  7:15 AM  Result Value Ref Range   Hgb A1c MFr Bld 6.2 (H) 4.8 - 5.6 %    Comment: (NOTE) Pre diabetes:          5.7%-6.4% Diabetes:              >6.4% Glycemic control for   <7.0% adults with diabetes    Mean Plasma Glucose 131.24 mg/dL    Comment: Performed at [facility], [address], [place], [place] [address]  Lipid panel  Status: Abnormal   Collection Time: 10/21/17  7:15 AM  Result Value Ref Range   Cholesterol 116 0 - 200 mg/dL   Triglycerides 54 <119 mg/dL   HDL 33 (L) >14 mg/dL   Total CHOL/HDL Ratio 3.5 RATIO   VLDL 11 0 - 40 mg/dL   LDL Cholesterol 72 0 - 99 mg/dL    Comment:        Total Cholesterol/HDL:CHD Risk Coronary Heart Disease Risk Table                     Men   Women  1/2 Average Risk   3.4   3.3  Average Risk       5.0   4.4  2 X Average Risk   9.6   7.1  3 X Average Risk  23.4   11.0        Use the calculated Patient Ratio above and the CHD Risk Table to determine the patient's CHD Risk.        ATP III CLASSIFICATION (LDL):  <100     mg/dL   Optimal  782-956  mg/dL   Near or Above                    Optimal  130-159  mg/dL   Borderline  213-086  mg/dL   High  >578     mg/dL   Very High     Blood Alcohol level:  Lab Results  Component Value Date   ETH <10 10/20/2017   ETH <10 10/09/2017    Metabolic Disorder Labs:  Lab Results  Component Value Date   HGBA1C 6.2 (H) 10/21/2017   MPG 131.24 10/21/2017   MPG 140 02/17/2017   Lab Results  Component Value Date   PROLACTIN 27.2 (H) 05/08/2016   PROLACTIN 6.9 01/19/2016   Lab Results  Component Value Date    CHOL 116 10/21/2017   TRIG 54 10/21/2017   HDL 33 (L) 10/21/2017   CHOLHDL 3.5 10/21/2017   VLDL 11 10/21/2017   LDLCALC 72 10/21/2017   LDLCALC 70 02/17/2017    Current Medications: Current Facility-Administered Medications  Medication Dose Route Frequency Provider Last Rate Last Dose  . acetaminophen (TYLENOL) tablet 650 mg  650 mg Oral Q6H PRN Clapacs, John T, MD      . allopurinol (ZYLOPRIM) tablet 100 mg  100 mg Oral Daily Clapacs, Jackquline Denmark, MD   100 mg at 10/21/17 0904  . alum & mag hydroxide-simeth (MAALOX/MYLANTA) 200-200-20 MG/5ML suspension 30 mL  30 mL Oral Q4H PRN Clapacs, John T, MD      . amantadine (SYMMETREL) capsule 100 mg  100 mg Oral BID Clapacs, Jackquline Denmark, MD   100 mg at 10/21/17 0904  . amLODipine (NORVASC) tablet 10 mg  10 mg Oral Daily Clapacs, John T, MD   10 mg at 10/21/17 0900  . benazepril (LOTENSIN) tablet 40 mg  40 mg Oral Daily Clapacs, Jackquline Denmark, MD   40 mg at 10/21/17 0904  . ciprofloxacin (CIPRO) tablet 500 mg  500 mg Oral BID Shanece Cochrane, Ileene Hutchinson, MD      . hydrOXYzine (ATARAX/VISTARIL) tablet 50 mg  50 mg Oral TID PRN Clapacs, John T, MD      . insulin aspart (novoLOG) injection 0-15 Units  0-15 Units Subcutaneous TID WC Clapacs, Jackquline Denmark, MD   2 Units at 10/21/17 1256  . magnesium hydroxide (MILK OF MAGNESIA) suspension 30 mL  30 mL Oral  Daily PRN Clapacs, Jackquline Denmark, MD      . Melene Muller ON 10/26/2017] paliperidone (INVEGA SUSTENNA) injection 156 mg  156 mg Intramuscular Once Mykaela Arena R, MD      . paliperidone (INVEGA) 24 hr tablet 9 mg  9 mg Oral Daily Clapacs, Jackquline Denmark, MD   9 mg at 10/21/17 0903  . potassium chloride SA (K-DUR,KLOR-CON) CR tablet 40 mEq  40 mEq Oral Once Petra Dumler, Ileene Hutchinson, MD      . traZODone (DESYREL) tablet 150 mg  150 mg Oral QHS PRN Jakyla Reza, Ileene Hutchinson, MD       PTA Medications: Medications Prior to Admission  Medication Sig Dispense Refill Last Dose  . allopurinol (ZYLOPRIM) 100 MG tablet Take 100 mg by mouth daily.     Marland Kitchen amantadine (SYMMETREL) 100 MG  capsule Take 1 capsule (100 mg total) by mouth 2 (two) times daily. 60 capsule 0   . amLODipine (NORVASC) 10 MG tablet Take 10 mg by mouth daily.     . benazepril (LOTENSIN) 40 MG tablet Take 1 tablet by mouth daily.     . [EXPIRED] cephALEXin (KEFLEX) 500 MG capsule Take 1 capsule (500 mg total) by mouth 2 (two) times daily for 10 days. 20 capsule 0   . colchicine 0.6 MG tablet 1 tablet by mouth daily until gout pain is gone. 15 tablet 0 Taking  . haloperidol (HALDOL) 5 MG tablet Take 1 tablet (5 mg total) by mouth at bedtime. 30 tablet 0   . oxyCODONE (ROXICODONE) 5 MG immediate release tablet Take 1 tablet (5 mg total) by mouth every 8 (eight) hours as needed. 15 tablet 0   . paliperidone (INVEGA SUSTENNA) 234 MG/1.5ML SUSP injection Inject 234 mg into the muscle once. Due May 7 1.5 mL 0   . paliperidone (INVEGA) 6 MG 24 hr tablet Take 2 tablets (12 mg total) by mouth at bedtime. 60 tablet 0   . predniSONE (DELTASONE) 10 MG tablet Take 1 tablet (10 mg total) by mouth daily. 6,5,4,3,2,1 six day taper 21 tablet 0   . traZODone (DESYREL) 150 MG tablet Take 1 tablet (150 mg total) by mouth at bedtime. 30 tablet 0     Musculoskeletal: Strength & Muscle Tone: within normal limits Gait & Station: normal Patient leans: N/A  Psychiatric Specialty Exam: Physical Exam  Nursing note and vitals reviewed.   Review of Systems  All other systems reviewed and are negative.   Blood pressure (!) 178/98, pulse 98, temperature 98.2 F (36.8 C), temperature source Oral, resp. rate 18, height 5\' 8"  (1.727 m), weight 107 kg (236 lb), SpO2 100 %.Body mass index is 35.88 kg/m.  General Appearance: Disheveled  Eye Contact:  Poor  Speech:  Garbled  Volume:  Decreased  Mood:  Euthymic  Affect:  Congruent  Thought Process:  Disorganized  Orientation:  Other:  No oriented to place or situation  Thought Content:  Illogical and Delusions  Suicidal Thoughts:  No  Homicidal Thoughts:  No  Memory:  NA   Judgement:  Poor  Insight:  Present  Psychomotor Activity:  Normal  Concentration:  Concentration: Poor  Recall:  Poor  Fund of Knowledge:  Poor  Language:  Poor  Akathisia:  No      Assets:  Resilience  ADL's:  Impaired  Cognition:  Impaired,  Severe  Sleep:  Number of Hours: 5.45    Treatment Plan Summary: 36 yo male with history of schizophrenia and medication noncompliance admitted due to decompensation.  [facility] ([facility] was [facility]). He is found to have UTI and elevated WBC which could be contributing to confusion. UTI will be treated and will continue to monitor CBC.   Plan:  Schizophrenia -Pt was given Invega 234 mg yesterday. Next dose due Monday.  -Restart home Haldol 5 mg qhs -Restart Symmetrel for EPS  UTI -Start Cipro 500 mg BID for 7 days -Monitor CBC   HTN -Start Norvasc and Benazepril  DM -Sliding scale  Hypokalemia -Replete with 40 meq  Dispo -Aunt is supportive. He will follow up with [facility]  Observation Level/Precautions:  15 minute checks  Laboratory:  CBC Chemistry Profile  Psychotherapy:    Medications:    Consultations:    Discharge Concerns:    Estimated LOS: 5-7  Other:     Physician Treatment Plan for Primary Diagnosis: Schizophrenia, undifferentiated (HCC) Long Term Goal(s): Improvement in symptoms so as ready for discharge  Short Term Goals: Ability to identify changes in lifestyle to reduce recurrence of condition will improve and Ability to demonstrate self-control will improve  Physician Treatment Plan for Secondary Diagnosis: Active Problems:   Schizophrenia, undifferentiated (HCC)  Long Term Goal(s): Improvement in symptoms so as ready for discharge  Short Term Goals: Compliance with prescribed medications will improve and Ability to identify triggers associated with substance abuse/mental health issues will improve  I certify that inpatient services furnished can reasonably be  expected to improve the patient's condition.    Haskell RilingHolly R Husam Hohn, MD 12/5/20182:06 PM

## 2017-10-21 NOTE — Plan of Care (Signed)
Patient is alert to self and situation. Patient denies SI, HI and AVH. Patient has been very isolated today, not attending groups. Patient has been compliant with medications today. Interactions with staff has been brief. Patient seems very guarded. Nurse will continue to encourage patient to attend groups and express feelings. Safety checks will continue to Q 15 minutes.

## 2017-10-21 NOTE — Progress Notes (Signed)
Recreation Therapy Notes  Date: 12.05.2018  Time: 3:00pm  Location: Craft room  Behavioral response: Restless, Disengaged  Group Type: Art/Craft  Participation level: None  Communication: Patient was disengaged from peers and staff.  Comments: Patient came to group and watched his peers participate while he sat with his arms folded. Individual stood over peers while they were completing the activity, redirection was needed. Patient walked in and out of group due to unknown reasons.   Danecia Underdown LRT/CTRS        Verley Pariseau 10/21/2017 4:20 PM

## 2017-10-21 NOTE — Progress Notes (Signed)
Recreation Therapy Notes   Date: 12.05.2018  Time: 9:30 am  Location: Craft Room  Behavioral response: N/A  Intervention Topic: Communication  Discussion/Intervention: Patient did not attend group.  Clinical Observations/Feedback:  Patient did not attend group.  Attikus Bartoszek LRT/CTRS           Folasade Mooty 10/21/2017 12:42 PM

## 2017-10-21 NOTE — Progress Notes (Signed)
D: Patient denies SI/HI/AVH. Patient is oriented x2 to person and situation. Patient is mildly confused and mumbling to self. Patient is frequently at nurses station asking for room to be cleaned.   A: Patient was assessed by this nurse. Patient received scheduled medications. Q x 15 minute observation checks were completed for safety. Patient was provided with verbal education on provided medications. Patient care plan was reviewed. Patient was offered support and encouragement. Patient was encourage to attend groups, participate in unit activities and continue with plan of care.   R: Patient adheres with scheduled medication. Patient has no complaints of pain at this time. Patient is receptive to treatment and safety maintained on unit. Patient had an estimated 5 hours 45 minutes of restful sleep.

## 2017-10-21 NOTE — Tx Team (Signed)
Interdisciplinary Treatment and Diagnostic Plan Update  10/21/2017 Time of Session: 11:00am Walter Pena. MRN: [id number]  Principal Diagnosis: Schizophrenia, undifferentiated (HCC)  Secondary Diagnoses: Principal Problem:   Schizophrenia, undifferentiated (HCC)   Current Medications:  Current Facility-Administered Medications  Medication Dose Route Frequency Provider Last Rate Last Dose  . acetaminophen (TYLENOL) tablet 650 mg  650 mg Oral Q6H PRN Clapacs, John T, MD      . allopurinol (ZYLOPRIM) tablet 100 mg  100 mg Oral Daily Clapacs, Jackquline Denmark, MD   100 mg at 10/21/17 0904  . alum & mag hydroxide-simeth (MAALOX/MYLANTA) 200-200-20 MG/5ML suspension 30 mL  30 mL Oral Q4H PRN Clapacs, John T, MD      . amantadine (SYMMETREL) capsule 100 mg  100 mg Oral BID Clapacs, Jackquline Denmark, MD   100 mg at 10/21/17 0904  . amLODipine (NORVASC) tablet 10 mg  10 mg Oral Daily Clapacs, John T, MD   10 mg at 10/21/17 0900  . benazepril (LOTENSIN) tablet 40 mg  40 mg Oral Daily Clapacs, Jackquline Denmark, MD   40 mg at 10/21/17 0904  . ciprofloxacin (CIPRO) tablet 500 mg  500 mg Oral BID McNew, Ileene Hutchinson, MD      . haloperidol (HALDOL) tablet 5 mg  5 mg Oral QHS McNew, Holly R, MD      . hydrOXYzine (ATARAX/VISTARIL) tablet 50 mg  50 mg Oral TID PRN Clapacs, John T, MD      . insulin aspart (novoLOG) injection 0-15 Units  0-15 Units Subcutaneous TID WC Clapacs, Jackquline Denmark, MD   2 Units at 10/21/17 1256  . magnesium hydroxide (MILK OF MAGNESIA) suspension 30 mL  30 mL Oral Daily PRN Clapacs, Jackquline Denmark, MD      . Melene Muller ON 10/26/2017] paliperidone (INVEGA SUSTENNA) injection 156 mg  156 mg Intramuscular Once McNew, Holly R, MD      . paliperidone (INVEGA) 24 hr tablet 9 mg  9 mg Oral Daily Clapacs, Jackquline Denmark, MD   9 mg at 10/21/17 0903  . potassium chloride SA (K-DUR,KLOR-CON) CR tablet 40 mEq  40 mEq Oral Once McNew, Ileene Hutchinson, MD      . traZODone (DESYREL) tablet 150 mg  150 mg Oral QHS PRN McNew, Ileene Hutchinson, MD       PTA  Medications: Medications Prior to Admission  Medication Sig Dispense Refill Last Dose  . allopurinol (ZYLOPRIM) 100 MG tablet Take 100 mg by mouth daily.     Marland Kitchen amantadine (SYMMETREL) 100 MG capsule Take 1 capsule (100 mg total) by mouth 2 (two) times daily. 60 capsule 0   . amLODipine (NORVASC) 10 MG tablet Take 10 mg by mouth daily.     . benazepril (LOTENSIN) 40 MG tablet Take 1 tablet by mouth daily.     . [EXPIRED] cephALEXin (KEFLEX) 500 MG capsule Take 1 capsule (500 mg total) by mouth 2 (two) times daily for 10 days. 20 capsule 0   . colchicine 0.6 MG tablet 1 tablet by mouth daily until gout pain is gone. 15 tablet 0 Taking  . haloperidol (HALDOL) 5 MG tablet Take 1 tablet (5 mg total) by mouth at bedtime. 30 tablet 0   . oxyCODONE (ROXICODONE) 5 MG immediate release tablet Take 1 tablet (5 mg total) by mouth every 8 (eight) hours as needed. 15 tablet 0   . paliperidone (INVEGA SUSTENNA) 234 MG/1.5ML SUSP injection Inject 234 mg into the muscle once. Due May 7 1.5 mL 0   .  paliperidone (INVEGA) 6 MG 24 hr tablet Take 2 tablets (12 mg total) by mouth at bedtime. 60 tablet 0   . predniSONE (DELTASONE) 10 MG tablet Take 1 tablet (10 mg total) by mouth daily. 6,5,4,3,2,1 six day taper 21 tablet 0   . traZODone (DESYREL) 150 MG tablet Take 1 tablet (150 mg total) by mouth at bedtime. 30 tablet 0     Patient Stressors: Financial difficulties Health problems Legal issue Occupational concerns Substance abuse Traumatic event  Patient Strengths: Active sense of humor Communication skills Work skills  Treatment Modalities: Medication Management, Group therapy, Case management,  1 to 1 session with clinician, Psychoeducation, Recreational therapy.   Physician Treatment Plan for Primary Diagnosis: Schizophrenia, undifferentiated (HCC) Long Term Goal(s): Improvement in symptoms so as ready for discharge Improvement in symptoms so as ready for discharge   Short Term Goals: Ability to  identify changes in lifestyle to reduce recurrence of condition will improve Ability to demonstrate self-control will improve Compliance with prescribed medications will improve Ability to identify triggers associated with substance abuse/mental health issues will improve  Medication Management: Evaluate patient's response, side effects, and tolerance of medication regimen.  Therapeutic Interventions: 1 to 1 sessions, Unit Group sessions and Medication administration.  Evaluation of Outcomes: Progressing  Physician Treatment Plan for Secondary Diagnosis: Principal Problem:   Schizophrenia, undifferentiated (HCC)  Long Term Goal(s): Improvement in symptoms so as ready for discharge Improvement in symptoms so as ready for discharge   Short Term Goals: Ability to identify changes in lifestyle to reduce recurrence of condition will improve Ability to demonstrate self-control will improve Compliance with prescribed medications will improve Ability to identify triggers associated with substance abuse/mental health issues will improve     Medication Management: Evaluate patient's response, side effects, and tolerance of medication regimen.  Therapeutic Interventions: 1 to 1 sessions, Unit Group sessions and Medication administration.  Evaluation of Outcomes: Progressing   RN Treatment Plan for Primary Diagnosis: Schizophrenia, undifferentiated (HCC) Long Term Goal(s): Knowledge of disease and therapeutic regimen to maintain health will improve  Short Term Goals: Ability to participate in decision making will improve and Ability to identify and develop effective coping behaviors will improve  Medication Management: RN will administer medications as ordered by provider, will assess and evaluate patient's response and provide education to patient for prescribed medication. RN will report any adverse and/or side effects to prescribing provider.  Therapeutic Interventions: 1 on 1 counseling  sessions, Psychoeducation, Medication administration, Evaluate responses to treatment, Monitor vital signs and CBGs as ordered, Perform/monitor CIWA, COWS, AIMS and Fall Risk screenings as ordered, Perform wound care treatments as ordered.  Evaluation of Outcomes: Progressing   LCSW Treatment Plan for Primary Diagnosis: Schizophrenia, undifferentiated (HCC) Long Term Goal(s): Safe transition to appropriate next level of care at discharge, Engage patient in therapeutic group addressing interpersonal concerns.  Short Term Goals: Engage patient in aftercare planning with referrals and resources, Identify triggers associated with mental health/substance abuse issues and Increase skills for wellness and recovery  Therapeutic Interventions: Assess for all discharge needs, 1 to 1 time with Social worker, Explore available resources and support systems, Assess for adequacy in community support network, Educate family and significant other(s) on suicide prevention, Complete Psychosocial Assessment, Interpersonal group therapy.  Evaluation of Outcomes: Progressing   Progress in Treatment: Attending groups: Yes. Participating in groups: No. Taking medication as prescribed: Yes. Toleration medication: Yes. Family/Significant other contact made: No, will contact:    Patient understands diagnosis: Yes. Discussing patient identified problems/goals  with staff: Yes. Medical problems stabilized or resolved: Yes. Denies suicidal/homicidal ideation: No. Issues/concerns per patient self-inventory: No. Other:    New problem(s) identified: No, Describe:     New Short Term/Long Term Goal(s):  Discharge Plan or Barriers:   Reason for Continuation of Hospitalization: Hallucinations Medication stabilization  Estimated Length of Stay:5-7 days  Recreational Therapy: Patient Stressors: N/A Patient Goal: Patient will attend and participate in Recreation Therapy Group Sessions x5  days.  Attendees: Patient:Walter Pena 10/21/2017 2:34 PM  Physician: Corinna Gab 10/21/2017 2:34 PM  Nursing: Hulan Amato, RN 10/21/2017 2:34 PM  RN Care Manager: 10/21/2017 2:34 PM  Social Worker: Jake Shark, LCSW 10/21/2017 2:34 PM  Recreational Therapist: Garret Reddish, LRT 10/21/2017 2:34 PM  Other:  10/21/2017 2:34 PM  Other:  10/21/2017 2:34 PM  Other: 10/21/2017 2:34 PM    Scribe for Treatment Team: Glennon Mac, LCSW 10/21/2017 2:34 PM

## 2017-10-21 NOTE — Progress Notes (Signed)
Recreation Therapy Notes  INPATIENT RECREATION THERAPY ASSESSMENT  Patient Details Name: Walter Pena. MRN: [id number] DOB: 03/01/1981 Today's Date: 10/21/2017 Patient refuse assessment.   Patient Stressors:    Coping Skills:      Engineer, building services:    Leisure Interests (2+):     Awareness of Community Resources:     [employer]:     Current Use:    If no, Barriers?:    Patient Strengths:     Patient Identified Areas of Improvement:     Current Recreation Participation:     Patient Goal for Hospitalization:     [place] of Residence:     [place] of Residence:      Current SI (including self-harm):     Current HI:     Consent to Intern Participation:     Walter Pena 10/21/2017, 2:52 PM
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
## 2017-10-22 DIAGNOSIS — F209 Schizophrenia, unspecified: Secondary | ICD-10-CM

## 2017-10-22 LAB — COMPREHENSIVE METABOLIC PANEL
ALBUMIN: 3.6 g/dL (ref 3.5–5.0)
ALT: 36 U/L (ref 17–63)
ANION GAP: 10 (ref 5–15)
AST: 26 U/L (ref 15–41)
Alkaline Phosphatase: 76 U/L (ref 38–126)
BILIRUBIN TOTAL: 0.5 mg/dL (ref 0.3–1.2)
BUN: 11 mg/dL (ref 6–20)
CO2: 21 mmol/L — ABNORMAL LOW (ref 22–32)
Calcium: 9.1 mg/dL (ref 8.9–10.3)
Chloride: 107 mmol/L (ref 101–111)
Creatinine, Ser: 0.94 mg/dL (ref 0.61–1.24)
GFR calc Af Amer: >60 mL/min (ref >60)
GLUCOSE: 108 mg/dL — AB (ref 65–99)
POTASSIUM: 3.9 mmol/L (ref 3.5–5.1)
Sodium: 138 mmol/L (ref 135–145)
Total Protein: 8.5 g/dL — ABNORMAL HIGH (ref 6.5–8.1)

## 2017-10-22 LAB — GLUCOSE, CAPILLARY
Glucose-Capillary: 103 mg/dL — ABNORMAL HIGH (ref 65–99)
Glucose-Capillary: 126 mg/dL — ABNORMAL HIGH (ref 65–99)
Glucose-Capillary: 90 mg/dL (ref 65–99)

## 2017-10-22 LAB — CBC WITH DIFFERENTIAL/PLATELET
BASOS ABS: 0.1 10*3/uL (ref 0–0.1)
BASOS PCT: 1 %
EOS PCT: 1 %
Eosinophils Absolute: 0.1 10*3/uL (ref 0–0.7)
HEMATOCRIT: 39 % — AB (ref 40.0–52.0)
Hemoglobin: 13.1 g/dL (ref 13.0–18.0)
LYMPHS PCT: 22 %
Lymphs Abs: 2.9 10*3/uL (ref 1.0–3.6)
MCH: 28.9 pg (ref 26.0–34.0)
MCHC: 33.6 g/dL (ref 32.0–36.0)
MCV: 86.1 fL (ref 80.0–100.0)
MONO ABS: 0.6 10*3/uL (ref 0.2–1.0)
Monocytes Relative: 4 %
NEUTROS ABS: 9.3 10*3/uL — AB (ref 1.4–6.5)
Neutrophils Relative %: 72 %
PLATELETS: 341 10*3/uL (ref 150–440)
RBC: 4.53 MIL/uL (ref 4.40–5.90)
RDW: 15.9 % — AB (ref 11.5–14.5)
WBC: 13 10*3/uL — AB (ref 3.8–10.6)

## 2017-10-22 MED ORDER — LOPERAMIDE HCL 2 MG PO CAPS: 2.0000 mg | ORAL_CAPSULE | ORAL | Status: AC | PRN

## 2017-10-22 MED ORDER — ADULT MULTIVITAMIN W/MINERALS CH
1.0000 | ORAL_TABLET | Freq: Every day | ORAL | Status: DC
  Administered 2017-10-22 – 2017-10-27 (×6): 1 via ORAL
  Filled 2017-10-22 (×6): qty 1

## 2017-10-22 MED ORDER — VITAMIN B-1 100 MG PO TABS
100.0000 mg | ORAL_TABLET | Freq: Every day | ORAL | Status: DC
  Administered 2017-10-23 – 2017-10-27 (×5): 100 mg via ORAL
  Filled 2017-10-22 (×5): qty 1

## 2017-10-22 MED ORDER — CHLORDIAZEPOXIDE HCL 25 MG PO CAPS
25.0000 mg | ORAL_CAPSULE | ORAL | Status: AC
Start: 2017-10-24 — End: 2017-10-25
  Administered 2017-10-24 – 2017-10-25 (×2): 25 mg via ORAL
  Filled 2017-10-22 (×2): qty 1

## 2017-10-22 MED ORDER — THIAMINE HCL 100 MG/ML IJ SOLN
100.0000 mg | Freq: Once | INTRAMUSCULAR | Status: AC
  Administered 2017-10-22: 100 mg via INTRAMUSCULAR
  Filled 2017-10-22: qty 1

## 2017-10-22 MED ORDER — CHLORDIAZEPOXIDE HCL 25 MG PO CAPS
25.0000 mg | ORAL_CAPSULE | Freq: Four times a day (QID) | ORAL | Status: AC
  Administered 2017-10-22 – 2017-10-23 (×4): 25 mg via ORAL
  Filled 2017-10-22 (×4): qty 1

## 2017-10-22 MED ORDER — CHLORDIAZEPOXIDE HCL 25 MG PO CAPS: 25.0000 mg | ORAL_CAPSULE | Freq: Four times a day (QID) | ORAL | Status: AC | PRN

## 2017-10-22 MED ORDER — CHLORDIAZEPOXIDE HCL 25 MG PO CAPS
25.0000 mg | ORAL_CAPSULE | Freq: Every day | ORAL | Status: AC
  Administered 2017-10-26: 25 mg via ORAL
  Filled 2017-10-22: qty 1

## 2017-10-22 MED ORDER — CHLORDIAZEPOXIDE HCL 25 MG PO CAPS
25.0000 mg | ORAL_CAPSULE | Freq: Three times a day (TID) | ORAL | Status: AC
Start: 2017-10-23 — End: 2017-10-24
  Administered 2017-10-23 – 2017-10-24 (×3): 25 mg via ORAL
  Filled 2017-10-22 (×3): qty 1

## 2017-10-22 MED ORDER — ONDANSETRON 4 MG PO TBDP: 4.0000 mg | ORAL_TABLET | Freq: Four times a day (QID) | ORAL | Status: AC | PRN

## 2017-10-22 NOTE — Progress Notes (Signed)
[facility] MD Progress Note  10/22/2017 12:55 PM Walter Pena.  MRN:  [id number]   Subjective:  Pt continues to be very confused. He is slightly improved with conversation and slightly more coherent in conversation. He is able to state that he is in the hospital at [facility] in [place]. However, when asked about date he states, "Well I know it's winter so it has to be between [birth year]1985 and 12." He states that the president is "Maudie Flakes." He does not recall why he is in the hospital. When asked if he has questions, he states, "Do you know where the door is?" he is able to state that he stays by himself and rents a room "on [address]." Pt states that he has not been sleeping well.  I spoke with his mother, Pearly today. She states that when he takes his medications he does extremely well. He is social, he keeps his apartment and himself clean and takes good care of his self. His mother states that when he is on his shot he does well. She states that he has been off his injection for about 5 months. She states that this is a typical presentation for him when he is off his medication. He gets very confused and bizarre. She states that he came over to her house recently without shoes on. He was very confused and then went outside and was making "finger guns"" and shooting the cars and sky. The police came over and took him to the hospital. She states that he gets better on the shot but can take "awhile sometimes." She states that he had a new girlfriend and they were drinking and "smoking reefer a lot." She is not sure how much he has been drinking.    Principal Problem: Schizophrenia, undifferentiated ([place]) Diagnosis:   Patient Active Problem List   Diagnosis Date Noted  . 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 Diabetes ([place]) [E11.9] 02/18/2017  . Schizophrenia ([place]) [F20.9] 02/17/2017  . Alcohol use disorder [IMO0002] 02/17/2017  . Noncompliance [Z91.19] 05/07/2016   . HTN (hypertension) [I10] 01/21/2016  . Cannabis use disorder, moderate, dependence ([place]) [F12.20] 01/21/2016  . Tobacco use disorder [F17.200] 01/21/2016  . Gout [M10.9] 01/21/2016  . Hidradenitis [L73.2] 01/21/2016   Total Time spent with patient: 20 minutes  Past Psychiatric History: See H&P  Past Medical History:  Past Medical History:  Diagnosis Date  . Gout   . Hidradenitis   . Hypertension   . Schizophrenia [facility])     Past Surgical History:  Procedure Laterality Date  . sweat gland removal     Family History:  Family History  Problem Relation Age of Onset  . Diabetes Mellitus II Mother   . Hypertension Mother    Family Psychiatric  History: See H&P Social History:  Social History   Substance and Sexual Activity  Alcohol Use Yes     Social History   Substance and Sexual Activity  Drug Use Yes  . Types: Marijuana    Social History   Socioeconomic History  . Marital status: Single    Spouse name: None  . Number of children: None  . Years of education: None  . Highest education level: None  Social Needs  . Financial resource strain: None  . Food insecurity - worry: None  . Food insecurity - inability: None  . Transportation needs - medical: None  . Transportation needs - non-medical: None  Occupational History  . None  Tobacco Use  .  Smoking status: Current Every Day Smoker    Packs/day: 1.00    Years: 9.00    Pack years: 9.00    Types: Cigarettes  . Smokeless tobacco: Never Used  Substance and Sexual Activity  . Alcohol use: Yes  . Drug use: Yes    Types: Marijuana  . Sexual activity: None  Other Topics Concern  . None  Social History Narrative  . None   Additional Social History:                         Sleep: Poor  Appetite:  Fair  Current Medications: Current Facility-Administered Medications  Medication Dose Route Frequency Provider Last Rate Last Dose  . acetaminophen (TYLENOL) tablet 650 mg  650 mg Oral Q6H PRN  Clapacs, John T, MD      . allopurinol (ZYLOPRIM) tablet 100 mg  100 mg Oral Daily Clapacs, Madie Reno, MD   100 mg at 10/22/17 0810  . alum & mag hydroxide-simeth (MAALOX/MYLANTA) 200-200-20 MG/5ML suspension 30 mL  30 mL Oral Q4H PRN Clapacs, John T, MD      . amantadine (SYMMETREL) capsule 100 mg  100 mg Oral BID Clapacs, Madie Reno, MD   100 mg at 10/22/17 0810  . amLODipine (NORVASC) tablet 10 mg  10 mg Oral Daily Clapacs, Madie Reno, MD   10 mg at 10/22/17 0810  . benazepril (LOTENSIN) tablet 40 mg  40 mg Oral Daily Clapacs, Madie Reno, MD   40 mg at 10/22/17 0810  . ciprofloxacin (CIPRO) tablet 500 mg  500 mg Oral BID Starlene Consuegra R, MD   500 mg at 10/22/17 0810  . haloperidol (HALDOL) tablet 5 mg  5 mg Oral QHS Lolitha Tortora R, MD   5 mg at 10/21/17 2152  . hydrOXYzine (ATARAX/VISTARIL) tablet 50 mg  50 mg Oral TID PRN Clapacs, John T, MD      . insulin aspart (novoLOG) injection 0-15 Units  0-15 Units Subcutaneous TID WC Clapacs, Madie Reno, MD   2 Units at 10/21/17 1256  . magnesium hydroxide (MILK OF MAGNESIA) suspension 30 mL  30 mL Oral Daily PRN Clapacs, Madie Reno, MD      . Derrill Memo ON 10/27/2017] paliperidone (INVEGA SUSTENNA) injection 156 mg  156 mg Intramuscular Once Tameshia Bonneville R, MD      . paliperidone (INVEGA) 24 hr tablet 9 mg  9 mg Oral Daily Clapacs, Madie Reno, MD   9 mg at 10/22/17 0810  . potassium chloride SA (K-DUR,KLOR-CON) CR tablet 40 mEq  40 mEq Oral Once Luby Seamans, Tyson Babinski, MD      . traZODone (DESYREL) tablet 150 mg  150 mg Oral QHS PRN Yamira Papa, Tyson Babinski, MD        Lab Results:  Results for orders placed or performed during the hospital encounter of 10/20/17 (from the past 48 hour(s))  Glucose, capillary     Status: None   Collection Time: 10/21/17  6:57 AM  Result Value Ref Range   Glucose-Capillary 98 65 - 99 mg/dL  Hemoglobin A1c     Status: Abnormal   Collection Time: 10/21/17  7:15 AM  Result Value Ref Range   Hgb A1c MFr Bld 6.2 (H) 4.8 - 5.6 %    Comment: (NOTE) Pre diabetes:           5.7%-6.4% Diabetes:              >6.4% Glycemic control for   <7.0% adults with  diabetes    Mean Plasma Glucose 131.24 mg/dL    Comment: Performed at [facility], [place] [address] [place], [place] [address]  Lipid panel     Status: Abnormal   Collection Time: 10/21/17  7:15 AM  Result Value Ref Range   Cholesterol 116 0 - 200 mg/dL   Triglycerides 54 <150 mg/dL   HDL 33 (L) >40 mg/dL   Total CHOL/HDL Ratio 3.5 RATIO   VLDL 11 0 - 40 mg/dL   LDL Cholesterol 72 0 - 99 mg/dL    Comment:        Total Cholesterol/HDL:CHD Risk Coronary Heart Disease Risk Table                     Men   Women  1/2 Average Risk   3.4   3.3  Average Risk       5.0   4.4  2 X Average Risk   9.6   7.1  3 X Average Risk  23.4   11.0        Use the calculated Patient Ratio above and the CHD Risk Table to determine the patient's CHD Risk.        ATP III CLASSIFICATION (LDL):  <100     mg/dL   Optimal  100-129  mg/dL   Near or Above                    Optimal  130-159  mg/dL   Borderline  160-189  mg/dL   High  >190     mg/dL   Very High   Glucose, capillary     Status: Abnormal   Collection Time: 10/21/17  8:54 AM  Result Value Ref Range   Glucose-Capillary 174 (H) 65 - 99 mg/dL  Glucose, capillary     Status: Abnormal   Collection Time: 10/21/17  4:55 PM  Result Value Ref Range   Glucose-Capillary 122 (H) 65 - 99 mg/dL  Comprehensive metabolic panel     Status: Abnormal   Collection Time: 10/22/17  6:37 AM  Result Value Ref Range   Sodium 138 135 - 145 mmol/L   Potassium 3.9 3.5 - 5.1 mmol/L   Chloride 107 101 - 111 mmol/L   CO2 21 (L) 22 - 32 mmol/L   Glucose, Bld 108 (H) 65 - 99 mg/dL   BUN 11 6 - 20 mg/dL   Creatinine, Ser 0.94 0.61 - 1.24 mg/dL   Calcium 9.1 8.9 - 10.3 mg/dL   Total Protein 8.5 (H) 6.5 - 8.1 g/dL   Albumin 3.6 3.5 - 5.0 g/dL   AST 26 15 - 41 U/L   ALT 36 17 - 63 U/L   Alkaline Phosphatase 76 38 - 126 U/L   Total Bilirubin 0.5 0.3 - 1.2 mg/dL   GFR calc  non Af Amer >60 >60 mL/min   GFR calc Af Amer >60 >60 mL/min    Comment: (NOTE) The eGFR has been calculated using the CKD EPI equation. This calculation has not been validated in all clinical situations. eGFR's persistently <60 mL/min signify possible Chronic Kidney Disease.    Anion gap 10 5 - 15  Glucose, capillary     Status: Abnormal   Collection Time: 10/22/17 11:33 AM  Result Value Ref Range   Glucose-Capillary 103 (H) 65 - 99 mg/dL    Blood Alcohol level:  Lab Results  Component Value Date   ETH <10 10/20/2017   ETH <10  01/75/1025    Metabolic Disorder Labs: Lab Results  Component Value Date   HGBA1C 6.2 (H) 10/21/2017   MPG 131.24 10/21/2017   MPG 140 02/17/2017   Lab Results  Component Value Date   PROLACTIN 27.2 (H) 05/08/2016   PROLACTIN 6.9 01/19/2016   Lab Results  Component Value Date   CHOL 116 10/21/2017   TRIG 54 10/21/2017   HDL 33 (L) 10/21/2017   CHOLHDL 3.5 10/21/2017   VLDL 11 10/21/2017   LDLCALC 72 10/21/2017   LDLCALC 70 02/17/2017    Physical Findings: AIMS: Facial and Oral Movements Muscles of Facial Expression: None, normal Lips and Perioral Area: None, normal Jaw: None, normal Tongue: None, normal,Extremity Movements Upper (arms, wrists, hands, fingers): None, normal Lower (legs, knees, ankles, toes): None, normal, Trunk Movements Neck, shoulders, hips: None, normal, Overall Severity Severity of abnormal movements (highest score from questions above): None, normal Incapacitation due to abnormal movements: None, normal Patient's awareness of abnormal movements (rate only patient's report): No Awareness, Dental Status Current problems with teeth and/or dentures?: No Does patient usually wear dentures?: No  CIWA:    COWS:     Musculoskeletal: Strength & Muscle Tone: within normal limits Gait & Station: normal Patient leans: N/A  Psychiatric Specialty Exam: Physical Exam  Nursing note and vitals reviewed.   Review of  Systems  All other systems reviewed and are negative.   Blood pressure (!) 150/89, pulse (!) 110, temperature 97.6 F (36.4 C), temperature source Oral, resp. rate 18, height '5\' 8"'$  (1.727 m), weight 107 kg (236 lb), SpO2 100 %.Body mass index is 35.88 kg/m.  General Appearance: Disheveled  Eye Contact:  Poor  Speech:  Garbled  Volume:  Decreased  Mood:  Negative  Affect:  Constricted  Thought Process:  Disorganized  Orientation:  Other:  Orietned to place but not date and time  Thought Content:  Illogical and Delusions  Suicidal Thoughts:  No  Homicidal Thoughts:  No  Memory:  Immediate;   Poor  Judgement:  Poor  Insight:  Lacking  Psychomotor Activity:  Normal  Concentration:  Concentration: Poor  Recall:  Poor  Fund of Knowledge:  Poor  Language:  Poor        Assets:  Resilience  ADL's:  Impaired  Cognition:  Impaired,  Moderate  Sleep:  Number of Hours: 6.45     Treatment Plan Summary: 36 yo male with history of schizophrenia admitted due to decompensation due to medication noncompliance. He continues to be very confused today although there is slight improvement. He is due for second Invega injection on Tuesday. Per his mother, he has been drinking and smoking a lot recently. She does not know how much and pt is not able to state this. His pulse and BP has been elevated so possible that he is going through w/d. Will start CIWA with Librium.   Plan:  Schizophrenia -Pt received initial Invega injection on 10/20/17. Next dose due Tuesday -Continue Haldol 5 mg qhs -Continue Symmetrel for EPS  UTI -Continue Cipro 500 mg BID for 7 days -CBC was not drawn this morning. Will order again for tomorrow -CBC was elevated at 17. He has not had any fevers.   Possible alcohol w/d -Start CIWA with Librium  HTN -Continue Norvasc and Benazepril  DM -sliding scale  Hypokalemia -Resolved  Dispo -aunt is supportive. He will follow up with [facility]. I spoke with his mother,  Pearly today.   Marylin Crosby, MD 10/22/2017, 12:55 PM

## 2017-10-22 NOTE — [facility] Group Notes (Signed)
[facility] Group Notes:  (Nursing/MHT/Case Management/Adjunct)  Date:  10/22/2017  Time:  3:14 PM  Type of Therapy:  Psychoeducational Skills  Participation Level:  Did Not Attend    Mickey Farber 10/22/2017, 3:14 PM

## 2017-10-22 NOTE — [facility] Group Notes (Signed)
  10/22/2017 9am  Type of Therapy and Topic: Group Therapy: Goals Group: SMART Goals   Participation Level: Did Not Attend  Description of Group:    The purpose of a daily goals group is to assist and guide patients in setting recovery/wellness-related goals. The objective is to set goals as they relate to the crisis in which they were admitted. Patients will be using SMART goal modalities to set measurable goals. Characteristics of realistic goals will be discussed and patients will be assisted in setting and processing how one will reach their goal. Facilitator will also assist patients in applying interventions and coping skills learned in psycho-education groups to the SMART goal and process how one will achieve defined goal.   Therapeutic Goals:   -Patients will develop and document one goal related to or their crisis in which brought them into treatment.  -Patients will be guided by LCSW using SMART goal setting modality in how to set a measurable, attainable, realistic and time sensitive goal.  -Patients will process barriers in reaching goal.  -Patients will process interventions in how to overcome and successful in reaching goal.   Patient's Goal: Did not attend  Therapeutic Modalities:  Motivational Interviewing  Cognitive Behavioral Therapy  Crisis Intervention Model  SMART goals setting  Johny Shears, LCSW 10/22/2017 10:48 AM

## 2017-10-22 NOTE — Plan of Care (Signed)
Patient slept for Estimated Hours of 6.45; Precautionary checks every 15 minutes for safety maintained, room free of safety hazards, patient sustains no injury or falls during this shift.  

## 2017-10-22 NOTE — [facility] Group Notes (Signed)
[facility] Group Notes:  (Nursing/MHT/Case Management/Adjunct)  Date:  10/22/2017  Time:  11:59 PM  Type of Therapy:  Evening Wrap-up Group  Participation Level:  Did Not Attend   Summary of Progress/Problems:  Walter Pena 10/22/2017, 11:59 PM

## 2017-10-22 NOTE — Progress Notes (Signed)
Recreation Therapy Notes   Date: 12.06.2018  Time: 3:00pm  Location: Craft room  Behavioral response: Disengaged, Restless  Group Type: Art/Craft  Participation level: None  Communication: Patient was disengaged from staff and peers during group.   Comments: Patient walked in and out of group several times due to unknown reasons. Patient watched peers participate in group. He paced around the craft room and started to touch many items. Patient was redirected by staff.   Aniya Jolicoeur LRT/CTRS        Artist Bloom 10/22/2017 4:41 PM

## 2017-10-22 NOTE — Plan of Care (Signed)
Patient remains isolative to room. Compliant with medication and meals, minimal interaction with peer/staff noted. Denies SI/HI/AVH and appears to be irritated with this nurse when questions were being asked. Safety will be maintained with q 15 minute checks.

## 2017-10-22 NOTE — Progress Notes (Signed)
Recreation Therapy Notes  Date: 12.06.2018  Time: 9:30 am  Location: Craft Room  Behavioral response: N/A  Intervention Topic: Teamwork  Discussion/Intervention: Patient did not attend group. Clinical Observations/Feedback:  Patient did not attend group. Johnnie Moten LRT/CTRS           Michaeljoseph Revolorio 10/22/2017 10:54 AM 

## 2017-10-22 NOTE — [facility] Group Notes (Signed)
LCSW Group Therapy Note  10/22/2017 1:15pm  Type of Therapy/Topic:  Group Therapy:  Balance in Life  Participation Level:  Did Not Attend  Description of Group:    This group will address the concept of balance and how it feels and looks when one is unbalanced. Patients will be encouraged to process areas in their lives that are out of balance and identify reasons for remaining unbalanced. Facilitators will guide patients in utilizing problem-solving interventions to address and correct the stressor making their life unbalanced. Understanding and applying boundaries will be explored and addressed for obtaining and maintaining a balanced life. Patients will be encouraged to explore ways to assertively make their unbalanced needs known to significant others in their lives, using other group members and facilitator for support and feedback.  Therapeutic Goals: 1. Patient will identify two or more emotions or situations they have that consume much of in their lives. 2. Patient will identify signs/triggers that life has become out of balance:  3. Patient will identify two ways to set boundaries in order to achieve balance in their lives:  4. Patient will demonstrate ability to communicate their needs through discussion and/or role plays  Summary of Patient Progress:      Therapeutic Modalities:   Cognitive Behavioral Therapy Solution-Focused Therapy Assertiveness Training  Alease Frame, LCSW 10/22/2017 3:22 PM

## 2017-10-23 LAB — GLUCOSE, CAPILLARY
GLUCOSE-CAPILLARY: 130 mg/dL — AB (ref 65–99)
Glucose-Capillary: 101 mg/dL — ABNORMAL HIGH (ref 65–99)
Glucose-Capillary: 108 mg/dL — ABNORMAL HIGH (ref 65–99)
Glucose-Capillary: 128 mg/dL — ABNORMAL HIGH (ref 65–99)

## 2017-10-23 NOTE — Progress Notes (Signed)
[facility] MD Progress Note  10/23/2017 12:06 PM Walter Pena.  MRN:  [id number]   Subjective:  Pt continues to be confused. He did take a shower yesterday. He is slightly more oriented today and knows he is "at [facility] in the hospital." He is not sure why he is here and not able to answer what the date is. When asked about AH, he states, "Sometimes." But now able to elaborate more on this. HE is able to deny that they tell him to harm himself or others. He states that he feels safe here and does not feel paranoid. He has been eating and drinking fluids. He denies that he has been drinking or using marijuana but his mother felt that he has been drinking more. He is very vague and mumbling most answers.   Principal Problem: Schizophrenia, undifferentiated ([place]) Diagnosis:   Patient Active Problem List   Diagnosis Date Noted  . 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 Diabetes ([place]) [E11.9] 02/18/2017  . Schizophrenia ([place]) [F20.9] 02/17/2017  . Alcohol use disorder [IMO0002] 02/17/2017  . Noncompliance [Z91.19] 05/07/2016  . HTN (hypertension) [I10] 01/21/2016  . Cannabis use disorder, moderate, dependence ([place]) [F12.20] 01/21/2016  . Tobacco use disorder [F17.200] 01/21/2016  . Gout [M10.9] 01/21/2016  . Hidradenitis [L73.2] 01/21/2016   Total Time spent with patient: 15 minutes  Past Psychiatric History: See H&P  Past Medical History:  Past Medical History:  Diagnosis Date  . Gout   . Hidradenitis   . Hypertension   . Schizophrenia [facility])     Past Surgical History:  Procedure Laterality Date  . sweat gland removal     Family History:  Family History  Problem Relation Age of Onset  . Diabetes Mellitus II Mother   . Hypertension Mother    Family Psychiatric  History: See H&P Social History:  Social History   Substance and Sexual Activity  Alcohol Use Yes     Social History   Substance and Sexual Activity  Drug Use Yes  . Types:  Marijuana    Social History   Socioeconomic History  . Marital status: Single    Spouse name: None  . Number of children: None  . Years of education: None  . Highest education level: None  Social Needs  . Financial resource strain: None  . Food insecurity - worry: None  . Food insecurity - inability: None  . Transportation needs - medical: None  . Transportation needs - non-medical: None  Occupational History  . None  Tobacco Use  . Smoking status: Current Every Day Smoker    Packs/day: 1.00    Years: 9.00    Pack years: 9.00    Types: Cigarettes  . Smokeless tobacco: Never Used  Substance and Sexual Activity  . Alcohol use: Yes  . Drug use: Yes    Types: Marijuana  . Sexual activity: None  Other Topics Concern  . None  Social History Narrative  . None   Additional Social History:                         Sleep: Fair  Appetite:  Fair  Current Medications: Current Facility-Administered Medications  Medication Dose Route Frequency Provider Last Rate Last Dose  . acetaminophen (TYLENOL) tablet 650 mg  650 mg Oral Q6H PRN Clapacs, John T, MD      . allopurinol (ZYLOPRIM) tablet 100 mg  100 mg Oral Daily Clapacs, Madie Reno, MD  100 mg at 10/23/17 0737  . alum & mag hydroxide-simeth (MAALOX/MYLANTA) 200-200-20 MG/5ML suspension 30 mL  30 mL Oral Q4H PRN Clapacs, John T, MD      . amantadine (SYMMETREL) capsule 100 mg  100 mg Oral BID Clapacs, Madie Reno, MD   100 mg at 10/23/17 0738  . amLODipine (NORVASC) tablet 10 mg  10 mg Oral Daily Clapacs, Madie Reno, MD   10 mg at 10/23/17 0738  . benazepril (LOTENSIN) tablet 40 mg  40 mg Oral Daily Clapacs, Madie Reno, MD   40 mg at 10/23/17 0738  . chlordiazePOXIDE (LIBRIUM) capsule 25 mg  25 mg Oral Q6H PRN Raeanna Soberanes R, MD      . chlordiazePOXIDE (LIBRIUM) capsule 25 mg  25 mg Oral TID Marylin Crosby, MD   25 mg at 10/23/17 1159   Followed by  . [START ON 10/24/2017] chlordiazePOXIDE (LIBRIUM) capsule 25 mg  25 mg Oral BH-qamhs  Kullen Tomasetti, Tyson Babinski, MD       Followed by  . [START ON 10/26/2017] chlordiazePOXIDE (LIBRIUM) capsule 25 mg  25 mg Oral Daily Livie Vanderhoof R, MD      . ciprofloxacin (CIPRO) tablet 500 mg  500 mg Oral BID Tessia Kassin R, MD   500 mg at 10/23/17 0737  . haloperidol (HALDOL) tablet 5 mg  5 mg Oral QHS Nechama Escutia R, MD   5 mg at 10/22/17 2025  . hydrOXYzine (ATARAX/VISTARIL) tablet 50 mg  50 mg Oral TID PRN Clapacs, John T, MD      . insulin aspart (novoLOG) injection 0-15 Units  0-15 Units Subcutaneous TID WC Clapacs, Madie Reno, MD   2 Units at 10/23/17 0741  . loperamide (IMODIUM) capsule 2-4 mg  2-4 mg Oral PRN Shaquanta Harkless, Tyson Babinski, MD      . magnesium hydroxide (MILK OF MAGNESIA) suspension 30 mL  30 mL Oral Daily PRN Clapacs, John T, MD      . multivitamin with minerals tablet 1 tablet  1 tablet Oral Daily Analia Zuk, Tyson Babinski, MD   1 tablet at 10/23/17 [phone]  . ondansetron (ZOFRAN-ODT) disintegrating tablet 4 mg  4 mg Oral Q6H PRN Toinette Lackie, Tyson Babinski, MD      . Derrill Memo ON 10/27/2017] paliperidone (INVEGA SUSTENNA) injection 156 mg  156 mg Intramuscular Once Somaly Marteney R, MD      . paliperidone (INVEGA) 24 hr tablet 9 mg  9 mg Oral Daily Clapacs, Madie Reno, MD   9 mg at 10/23/17 0738  . thiamine (VITAMIN B-1) tablet 100 mg  100 mg Oral Daily Nola Botkins, Tyson Babinski, MD   100 mg at 10/23/17 5465  . traZODone (DESYREL) tablet 150 mg  150 mg Oral QHS PRN Chaney Ingram, Tyson Babinski, MD        Lab Results:  Results for orders placed or performed during the hospital encounter of 10/20/17 (from the past 48 hour(s))  Glucose, capillary     Status: Abnormal   Collection Time: 10/21/17  4:55 PM  Result Value Ref Range   Glucose-Capillary 122 (H) 65 - 99 mg/dL  Comprehensive metabolic panel     Status: Abnormal   Collection Time: 10/22/17  6:37 AM  Result Value Ref Range   Sodium 138 135 - 145 mmol/L   Potassium 3.9 3.5 - 5.1 mmol/L   Chloride 107 101 - 111 mmol/L   CO2 21 (L) 22 - 32 mmol/L   Glucose, Bld 108 (H) 65 - 99 mg/dL   BUN 11 6 -  20 mg/dL   Creatinine, Ser 0.94 0.61 - 1.24 mg/dL   Calcium 9.1 8.9 - 10.3 mg/dL   Total Protein 8.5 (H) 6.5 - 8.1 g/dL   Albumin 3.6 3.5 - 5.0 g/dL   AST 26 15 - 41 U/L   ALT 36 17 - 63 U/L   Alkaline Phosphatase 76 38 - 126 U/L   Total Bilirubin 0.5 0.3 - 1.2 mg/dL   GFR calc non Af Amer >60 >60 mL/min   GFR calc Af Amer >60 >60 mL/min    Comment: (NOTE) The eGFR has been calculated using the CKD EPI equation. This calculation has not been validated in all clinical situations. eGFR's persistently <60 mL/min signify possible Chronic Kidney Disease.    Anion gap 10 5 - 15  Glucose, capillary     Status: Abnormal   Collection Time: 10/22/17 11:33 AM  Result Value Ref Range   Glucose-Capillary 103 (H) 65 - 99 mg/dL  CBC with Differential/Platelet     Status: Abnormal   Collection Time: 10/22/17  2:04 PM  Result Value Ref Range   WBC 13.0 (H) 3.8 - 10.6 K/uL   RBC 4.53 4.40 - 5.90 MIL/uL   Hemoglobin 13.1 13.0 - 18.0 g/dL   HCT 39.0 (L) 40.0 - 52.0 %   MCV 86.1 80.0 - 100.0 fL   MCH 28.9 26.0 - 34.0 pg   MCHC 33.6 32.0 - 36.0 g/dL   RDW 15.9 (H) 11.5 - 14.5 %   Platelets 341 150 - 440 K/uL   Neutrophils Relative % 72 %   Neutro Abs 9.3 (H) 1.4 - 6.5 K/uL   Lymphocytes Relative 22 %   Lymphs Abs 2.9 1.0 - 3.6 K/uL   Monocytes Relative 4 %   Monocytes Absolute 0.6 0.2 - 1.0 K/uL   Eosinophils Relative 1 %   Eosinophils Absolute 0.1 0 - 0.7 K/uL   Basophils Relative 1 %   Basophils Absolute 0.1 0 - 0.1 K/uL  Glucose, capillary     Status: Abnormal   Collection Time: 10/22/17  4:44 PM  Result Value Ref Range   Glucose-Capillary 126 (H) 65 - 99 mg/dL  Glucose, capillary     Status: None   Collection Time: 10/22/17  8:24 PM  Result Value Ref Range   Glucose-Capillary 90 65 - 99 mg/dL   Comment 1 Notify RN   Glucose, capillary     Status: Abnormal   Collection Time: 10/23/17  7:12 AM  Result Value Ref Range   Glucose-Capillary 130 (H) 65 - 99 mg/dL   Comment 1 Notify RN    Glucose, capillary     Status: Abnormal   Collection Time: 10/23/17 11:45 AM  Result Value Ref Range   Glucose-Capillary 101 (H) 65 - 99 mg/dL   Comment 1 Notify RN     Blood Alcohol level:  Lab Results  Component Value Date   ETH <10 10/20/2017   ETH <10 29/93/7169    Metabolic Disorder Labs: Lab Results  Component Value Date   HGBA1C 6.2 (H) 10/21/2017   MPG 131.24 10/21/2017   MPG 140 02/17/2017   Lab Results  Component Value Date   PROLACTIN 27.2 (H) 05/08/2016   PROLACTIN 6.9 01/19/2016   Lab Results  Component Value Date   CHOL 116 10/21/2017   TRIG 54 10/21/2017   HDL 33 (L) 10/21/2017   CHOLHDL 3.5 10/21/2017   VLDL 11 10/21/2017   LDLCALC 72 10/21/2017   LDLCALC 70 02/17/2017    Physical  Findings: AIMS: Facial and Oral Movements Muscles of Facial Expression: None, normal Lips and Perioral Area: None, normal Jaw: None, normal Tongue: None, normal,Extremity Movements Upper (arms, wrists, hands, fingers): None, normal Lower (legs, knees, ankles, toes): None, normal, Trunk Movements Neck, shoulders, hips: None, normal, Overall Severity Severity of abnormal movements (highest score from questions above): None, normal Incapacitation due to abnormal movements: None, normal Patient's awareness of abnormal movements (rate only patient's report): No Awareness, Dental Status Current problems with teeth and/or dentures?: No Does patient usually wear dentures?: No  CIWA:  CIWA-Ar Total: 0 COWS:     Musculoskeletal: Strength & Muscle Tone: within normal limits Gait & Station: normal Patient leans: N/A  Psychiatric Specialty Exam: Physical Exam  Nursing note and vitals reviewed.   Review of Systems  All other systems reviewed and are negative.   Blood pressure (!) 143/96, pulse 86, temperature 97.8 F (36.6 C), temperature source Oral, resp. rate 18, height _0  (1.727 m), weight 107 kg (236 lb), SpO2 100 %.Body mass index is 35.88 kg/m.  General  Appearance: Casual  Eye Contact:  Minimal  Speech:  Garbled  Volume:  Decreased  Mood:  Depressed  Affect:  Congruent  Thought Process:  Disorganized  Orientation:  Other:  Not oriented to date or situation  Thought Content:  Illogical  Suicidal Thoughts:  No  Homicidal Thoughts:  No  Memory:  Immediate;   Poor  Judgement:  Poor  Insight:  Lacking  Psychomotor Activity:  Normal  Concentration:  Concentration: Poor  Recall:  Poor  Fund of Knowledge:  Poor  Language:  Poor  Akathisia:  No      Assets:  Resilience Social Support  ADL's:  Impaired  Cognition:  Impaired  Sleep:  Number of Hours: 8.45     Treatment Plan Summary: 36 yo male with history of schizophrenia and medication noncompliance presents due to decompensation. He does have UTI which is being treated. WBC is improving. He has not had any fevers.  Pt is still very confused today.   Plan:  Schizophrenia -Given Lorayne Bender Sustenna 234 mg IM on 12/4. Next dose of 156 mg due on Tuesday -Continue Haldol 5 mg qhs -Continue Symmetrel  UTI -Continue Cipro 500 mg BID for 7 days -CBC improving to 13 from 17  HTN -Continue Norvasc and Benazepril  Possible alcohol w/d -Continue CIWA with Librium taper  Dispo -Aunt and mother are very supportive. I spoke with his mother yesterday. He lives alone. He was referred for ACT team for more support Marylin Crosby, MD 10/23/2017, 12:06 PM

## 2017-10-23 NOTE — Progress Notes (Signed)
Recreation Therapy Notes  Date: 12.07.2018  Time: 1:00pm  Location: Craft Room  Behavioral response: N/A  Intervention Topic: Stress  Discussion/Intervention: Patient did not attend group.  Clinical Observations/Feedback:  Patient did not attend group.  Brandom Kerwin LRT/CTRS         Lakrista Scaduto 10/23/2017 2:15 PM

## 2017-10-23 NOTE — Plan of Care (Signed)
Patient slept for Estimated Hours of 8.45; Precautionary checks every 15 minutes for safety maintained, room free of safety hazards, patient sustains no injury or falls during this shift.  

## 2017-10-23 NOTE — Plan of Care (Signed)
Patient compliant with medications and meals, minimal peer interaction observed. Patient has been lying in bed majority of shift with eyes closed. When speaking to patient he ignores the speaker, affect is flat and sad. Milieu remains safe.

## 2017-10-23 NOTE — Progress Notes (Signed)
CS[facility]  [facility] early next week.  Monday if the snow is not bad, will touch base based on weather after that. Walter Pena, MSW, LCSW Clinical Social Worker 10/23/2017 2:52 PM

## 2017-10-23 NOTE — Progress Notes (Signed)
Patient ID: Walter Pena., male   DOB: Feb 29, 1981, 36 y.o.   MRN: [id number] Isolates mostly to room sleeping, confused still, persuaded and received bedtime medications, CBG=90, denied SI/HI.

## 2017-10-23 NOTE — [facility] Group Notes (Signed)
[facility] LCSW Group Therapy Note  Date/Time: 10/23/17, 1400  Type of Therapy and Topic:  Group Therapy:  Feelings around Relapse and Recovery  Participation Level:  Did Not Attend   Mood:  Description of Group:    Patients in this group will discuss emotions they experience before and after a relapse. They will process how experiencing these feelings, or avoidance of experiencing them, relates to having a relapse. Facilitator will guide patients to explore emotions they have related to recovery. Patients will be encouraged to process which emotions are more powerful. They will be guided to discuss the emotional reaction significant others in their lives may have to patients' relapse or recovery. Patients will be assisted in exploring ways to respond to the emotions of others without this contributing to a relapse.  Therapeutic Goals: 1. Patient will identify two or more emotions that lead to relapse for them:  2. Patient will identify two emotions that result when they relapse:  3. Patient will identify two emotions related to recovery:  4. Patient will demonstrate ability to communicate their needs through discussion and/or role plays.   Summary of Patient Progress:     Therapeutic Modalities:   Cognitive Behavioral Therapy Solution-Focused Therapy Assertiveness Training Relapse Prevention Therapy  Greg Hara Milholland, LCSW       

## 2017-10-24 LAB — GLUCOSE, CAPILLARY
GLUCOSE-CAPILLARY: 159 mg/dL — AB (ref 65–99)
Glucose-Capillary: 110 mg/dL — ABNORMAL HIGH (ref 65–99)
Glucose-Capillary: 137 mg/dL — ABNORMAL HIGH (ref 65–99)

## 2017-10-24 NOTE — Progress Notes (Addendum)
[facility]  10/24/2017 3:20 PM Neta Ehlersharles L Giangregorio Jr.  MRN:  [id number]   Subjective:   Pt reportedly isolative in his room, very slow to respond, paucity of speech, and  very guarded, holds med sin hand for long period of time, but took them finally. Pt has minimal peer interaction. Pt very guarded, suspicious, preoccupied with  not having  his  personal belongings, poor insight, demanding to release him today. Med compliant, denies side effects. Denies SI/HI.       Principal Problem: Schizophrenia, undifferentiated ([facility]) Diagnosis:   Patient Active Problem List   Diagnosis Date Noted  . Schizophrenia, undifferentiated ([facility]) [F20.3] 10/20/2017  . Diabetes ([facility]) [E11.9] 02/18/2017  . Schizophrenia ([facility]) [F20.9] 02/17/2017  . Alcohol use disorder [IMO0002] 02/17/2017  . Noncompliance [Z91.19] 05/07/2016  . HTN (hypertension) [I10] 01/21/2016  . Cannabis use disorder, moderate, dependence ([facility]) [F12.20] 01/21/2016  . Tobacco use disorder [F17.200] 01/21/2016  . Gout [M10.9] 01/21/2016  . Hidradenitis [L73.2] 01/21/2016   Total Time spent with patient: 25 min  Past Psychiatric History: See H&P  Past Medical History:  Past Medical History:  Diagnosis Date  . Gout   . Hidradenitis   . Hypertension   . Schizophrenia [facility](HCC)     Past Surgical History:  Procedure Laterality Date  . sweat gland removal     Family History:  Family History  Problem Relation Age of Onset  . Diabetes Mellitus II Mother   . Hypertension Mother    Family Psychiatric  History: See H&P Social History:  Social History   Substance and Sexual Activity  Alcohol Use Yes     Social History   Substance and Sexual Activity  Drug Use Yes  . Types: Marijuana    Social History   Socioeconomic History  . Marital status: Single    Spouse name: None  . Number of children: None  . Years of education: None  . Highest education level: None  Social Needs  . Financial resource strain: None  .  Food insecurity - worry: None  . Food insecurity - inability: None  . Transportation needs - medical: None  . Transportation needs - non-medical: None  Occupational History  . None  Tobacco Use  . Smoking status: Current Every Day Smoker    Packs/day: 1.00    Years: 9.00    Pack years: 9.00    Types: Cigarettes  . Smokeless tobacco: Never Used  Substance and Sexual Activity  . Alcohol use: Yes  . Drug use: Yes    Types: Marijuana  . Sexual activity: None  Other Topics Concern  . None  Social History Narrative  . None   Additional Social History:                         Sleep: Fair  Appetite:  Fair  Current Medications: Current Facility-Administered Medications  Medication Dose Route Frequency Provider Last Rate Last Dose  . acetaminophen (TYLENOL) tablet 650 mg  650 mg Oral Q6H PRN Clapacs, John T, MD      . allopurinol (ZYLOPRIM) tablet 100 mg  100 mg Oral Daily Clapacs, Jackquline DenmarkJohn T, MD   100 mg at 10/24/17 0804  . alum & mag hydroxide-simeth (MAALOX/MYLANTA) 200-200-20 MG/5ML suspension 30 mL  30 mL Oral Q4H PRN Clapacs, John T, MD      . amantadine (SYMMETREL) capsule 100 mg  100 mg Oral BID Clapacs, Jackquline DenmarkJohn T, MD   100 mg at  10/24/17 0806  . amLODipine (NORVASC) tablet 10 mg  10 mg Oral Daily Clapacs, Jackquline Denmark, MD   10 mg at 10/24/17 0807  . benazepril (LOTENSIN) tablet 40 mg  40 mg Oral Daily Clapacs, Jackquline Denmark, MD   40 mg at 10/24/17 0804  . chlordiazePOXIDE (LIBRIUM) capsule 25 mg  25 mg Oral Q6H PRN McNew, Holly R, MD      . chlordiazePOXIDE (LIBRIUM) capsule 25 mg  25 mg Oral BH-qamhs McNew, Ileene Hutchinson, MD       Followed by  . [START ON 10/26/2017] chlordiazePOXIDE (LIBRIUM) capsule 25 mg  25 mg Oral Daily McNew, Holly R, MD      . ciprofloxacin (CIPRO) tablet 500 mg  500 mg Oral BID McNew, Ileene Hutchinson, MD   500 mg at 10/24/17 0804  . haloperidol (HALDOL) tablet 5 mg  5 mg Oral QHS McNew, Ileene Hutchinson, MD   5 mg at 10/23/17 2105  . hydrOXYzine (ATARAX/VISTARIL) tablet 50 mg  50  mg Oral TID PRN Clapacs, John T, MD      . insulin aspart (novoLOG) injection 0-15 Units  0-15 Units Subcutaneous TID WC Clapacs, Jackquline Denmark, MD   2 Units at 10/23/17 0741  . loperamide (IMODIUM) capsule 2-4 mg  2-4 mg Oral PRN McNew, Ileene Hutchinson, MD      . magnesium hydroxide (MILK OF MAGNESIA) suspension 30 mL  30 mL Oral Daily PRN Clapacs, John T, MD      . multivitamin with minerals tablet 1 tablet  1 tablet Oral Daily McNew, Ileene Hutchinson, MD   1 tablet at 10/24/17 [phone]  . ondansetron (ZOFRAN-ODT) disintegrating tablet 4 mg  4 mg Oral Q6H PRN McNew, Ileene Hutchinson, MD      . Melene Muller ON 10/27/2017] paliperidone (INVEGA SUSTENNA) injection 156 mg  156 mg Intramuscular Once McNew, Holly R, MD      . paliperidone (INVEGA) 24 hr tablet 9 mg  9 mg Oral Daily Clapacs, Jackquline Denmark, MD   9 mg at 10/24/17 0807  . thiamine (VITAMIN B-1) tablet 100 mg  100 mg Oral Daily McNew, Ileene Hutchinson, MD   100 mg at 10/24/17 0807  . traZODone (DESYREL) tablet 150 mg  150 mg Oral QHS PRN McNew, Ileene Hutchinson, MD        Lab Results:  Results for orders placed or performed during the hospital encounter of 10/20/17 (from the past 48 hour(s))  Glucose, capillary     Status: Abnormal   Collection Time: 10/22/17  4:44 PM  Result Value Ref Range   Glucose-Capillary 126 (H) 65 - 99 mg/dL  Glucose, capillary     Status: None   Collection Time: 10/22/17  8:24 PM  Result Value Ref Range   Glucose-Capillary 90 65 - 99 mg/dL   Comment 1 Notify RN   Glucose, capillary     Status: Abnormal   Collection Time: 10/23/17  7:12 AM  Result Value Ref Range   Glucose-Capillary 130 (H) 65 - 99 mg/dL   Comment 1 Notify RN   Glucose, capillary     Status: Abnormal   Collection Time: 10/23/17 11:45 AM  Result Value Ref Range   Glucose-Capillary 101 (H) 65 - 99 mg/dL   Comment 1 Notify RN   Glucose, capillary     Status: Abnormal   Collection Time: 10/23/17  4:21 PM  Result Value Ref Range   Glucose-Capillary 108 (H) 65 - 99 mg/dL  Glucose, capillary     Status:  Abnormal  Collection Time: 10/23/17  8:13 PM  Result Value Ref Range   Glucose-Capillary 128 (H) 65 - 99 mg/dL   Comment 1 Notify RN   Glucose, capillary     Status: Abnormal   Collection Time: 10/24/17  7:04 AM  Result Value Ref Range   Glucose-Capillary 110 (H) 65 - 99 mg/dL   Comment 1 Notify RN   Glucose, capillary     Status: Abnormal   Collection Time: 10/24/17 11:39 AM  Result Value Ref Range   Glucose-Capillary 159 (H) 65 - 99 mg/dL    Blood Alcohol level:  Lab Results  Component Value Date   ETH <10 10/20/2017   ETH <10 10/09/2017    Metabolic Disorder Labs: Lab Results  Component Value Date   HGBA1C 6.2 (H) 10/21/2017   MPG 131.24 10/21/2017   MPG 140 02/17/2017   Lab Results  Component Value Date   PROLACTIN 27.2 (H) 05/08/2016   PROLACTIN 6.9 01/19/2016   Lab Results  Component Value Date   CHOL 116 10/21/2017   TRIG 54 10/21/2017   HDL 33 (L) 10/21/2017   CHOLHDL 3.5 10/21/2017   VLDL 11 10/21/2017   LDLCALC 72 10/21/2017   LDLCALC 70 02/17/2017    Physical Findings: AIMS: Facial and Oral Movements Muscles of Facial Expression: None, normal Lips and Perioral Area: None, normal Jaw: None, normal Tongue: None, normal,Extremity Movements Upper (arms, wrists, hands, fingers): None, normal Lower (legs, knees, ankles, toes): None, normal, Trunk Movements Neck, shoulders, hips: None, normal, Overall Severity Severity of abnormal movements (highest score from questions above): None, normal Incapacitation due to abnormal movements: None, normal Patient's awareness of abnormal movements (rate only patient's report): No Awareness, Dental Status Current problems with teeth and/or dentures?: No Does patient usually wear dentures?: No  CIWA:  CIWA-Ar Total: 1 COWS:     Musculoskeletal: Strength & Muscle Tone: within normal limits Gait & Station: normal Patient leans: N/A  Psychiatric Specialty Exam: Physical Exam  Nursing note and vitals  reviewed.   Review of Systems  All other systems reviewed and are negative.   Blood pressure 118/88, pulse (!) 101, temperature 98.1 F (36.7 C), temperature source Oral, resp. rate 18, height 5\' 8"  (1.727 m), weight 107 kg (236 lb), SpO2 100 %.Body mass index is 35.88 kg/m.  General Appearance: Casual  Eye Contact:  Minimal  Speech:  Garbled  Volume:  Decreased  Mood:  Depressed  Affect:  Congruent  Thought Process:  Disorganized  Orientation:  Other:  Not oriented to date or situation  Thought Content:  Illogical, paranoid  Suicidal Thoughts:  No  Homicidal Thoughts:  No  Memory:  Immediate;   Poor  Judgement:  Poor  Insight:  Lacking  Psychomotor Activity:  Normal  Concentration:  Concentration: Poor  Recall:  Poor  Fund of Knowledge:  Poor  Language:  Poor  Akathisia:  No      Assets:  Resilience Social Support  ADL's:  Impaired  Cognition:  Impaired  Sleep:  Number of Hours: 7.5     Treatment Plan Summary: 36 yo male with history of schizophrenia and medication noncompliance presents due to decompensation. He does have UTI which is being treated. WBC is improving.   Pt is paranoid, guarded.  Plan:  Schizophrenia -Given Hinda Glatter Sustenna 234 mg IM on 12/4. Next dose of 156 mg due on Tuesday -Continue Haldol 5 mg qhs -Continue Symmetrel  UTI -Continue Cipro 500 mg BID for 7 days -CBC improving to 13 from 17  HTN -Continue Norvasc and Benazepril  Possible alcohol w/d -Continue CIWA with Librium taper  Dispo -Aunt and mother are very supportive. I spoke with his mother yesterday. He lives alone. He was referred for ACT team for more support Beverly Sessions, MD 10/24/2017, 3:20 PMPatient ID: Neta Ehlers., male   DOB: 01/05/1981, 36 y.o.   MRN: [id number]

## 2017-10-24 NOTE — Plan of Care (Signed)
Pt verbalizes understanding of provided education. Pt reports improvements and denies complaints this evening. Pt reports sleeping, "good". Pt able to verbally contract for safety this evening and reports he can remain safe while on the unit. Pt reports improvements in mood and coping.

## 2017-10-24 NOTE — Plan of Care (Signed)
Patient denies si, hi, avh. He paces on the unit. He is irritable at times. He eats his meals and goes to the day room. He is med compliant with pills but at times does not want his blood sugar checked. He complains of being cold. We did get his jacket out of the locker and cut the strings with his permission. Safety maintained.

## 2017-10-24 NOTE — [facility] Group Notes (Signed)
10/24/2017 1:15pm  Type of Therapy and Topic: Group Therapy: Holding on to Grudges   Participation Level: Did Not Attend   Description of Group:  In this group patients will be asked to explore and define a grudge. Patients will be guided to discuss their thoughts, feelings, and reasons as to why people have grudges. Patients will process the impact grudges have on daily life and identify thoughts and feelings related to holding grudges. Facilitator will challenge patients to identify ways to let go of grudges and the benefits this provides. Patients will be confronted to address why one struggles letting go of grudges. Lastly, patients will identify feelings and thoughts related to what life would look like without grudges. This group will be process-oriented, with patients participating in exploration of their own experiences, giving and receiving support, and processing challenge from other group members.  Therapeutic Goals:  1. Patient will identify specific grudges related to their personal life.  2. Patient will identify feelings, thoughts, and beliefs around grudges.  3. Patient will identify how one releases grudges appropriately.  4. Patient will identify situations where they could have let go of the grudge, but instead chose to hold on.   Summary of Patient Progress:   Therapeutic Modalities:  Cognitive Behavioral Therapy  Solution Focused Therapy  Motivational Interviewing  Brief Therapy   Ardis Lawley  CUEBAS-COLON, LCSW 10/24/2017 12:03 PM  

## 2017-10-24 NOTE — Progress Notes (Signed)
In his room all shift except for snack. Answered questions  with one word, denies AVH, anxiety or depression. Was very slow to respond, paucity of speech, and appeared very guarded. Was med compliant, denies physical problems. Remains on routine obs for safety.

## 2017-10-25 ENCOUNTER — Encounter: Payer: Self-pay | Admitting: Psychiatry

## 2017-10-25 LAB — GLUCOSE, CAPILLARY
GLUCOSE-CAPILLARY: 122 mg/dL — AB (ref 65–99)
GLUCOSE-CAPILLARY: 139 mg/dL — AB (ref 65–99)
GLUCOSE-CAPILLARY: 109 mg/dL — AB (ref 65–99)

## 2017-10-25 NOTE — [facility] Group Notes (Signed)
[facility] Group Notes:  (Nursing/MHT/Case Management/Adjunct)  Date:  10/25/2017  Time:  1:26 AM  Type of Therapy:  Psychoeducational Skills  Participation Level:  Active  Participation Quality:  Appropriate and Attentive  Affect:  Appropriate  Cognitive:  Appropriate  Insight:  Appropriate  Engagement in Group:  Engaged  Modes of Intervention:  Discussion, Socialization and Support  Summary of Progress/Problems:  Walter MilroyLaquanda Y Jerin Pena 10/25/2017, 1:26 AM

## 2017-10-25 NOTE — Progress Notes (Signed)
D:Pt denies SI/HI/AVH. Pt able to verbally contract for safety and states he can remain safe while on the unit. Pt upon interaction appears slightly blunted/guarded, but participates in conversation with this Clinical research associatewriter. Pt presents disheveled with body odor and was encouraged today to make sure he was attending to ADL's. Pt also comes off slightly bizarre when analyzing the entirety of the patient during interaction and how he presents overall.       A: Q x 15 minute observation checks were completed for safety. Patient was provided with education. Patient was given scheduled medications. Patient  was encourage to attend groups, participate in unit activities and continue with plan of care.   R:Patient is complaint with medication and unit procedures. Pt attends snacks and activities. Pt. Reports feeling, "better" today then he did yesterday. Pt reports sleeping, "good".             Patient slept for Estimated Hours of 7.45; Precautionary checks every 15 minutes for safety maintained, room free of safety hazards, patient sustains no injury or falls during this shift.

## 2017-10-25 NOTE — Plan of Care (Signed)
Patient up on the unit. He is more pleasant today. He denies si, hi, avh. Denies pain. Safety maintained. Will continue to monitor.

## 2017-10-25 NOTE — Progress Notes (Signed)
[facility] MD Progress Note  10/25/2017 1:14 PM Walter Pena.  MRN:  [id number]   Subjective:   Pt still somewhat guarded, blunted affect, but more cooperative, denies AVH, denies SI/HI. Med compliant,denies side effects. More interacting with peers.  r  Principal Problem: Schizophrenia, undifferentiated (HCC) Diagnosis:   Patient Active Problem List   Diagnosis Date Noted  . Schizophrenia, undifferentiated (HCC) [F20.3] 10/20/2017  . Diabetes (HCC) [E11.9] 02/18/2017  . Schizophrenia (HCC) [F20.9] 02/17/2017  . Alcohol use disorder [IMO0002] 02/17/2017  . Noncompliance [Z91.19] 05/07/2016  . HTN (hypertension) [I10] 01/21/2016  . Cannabis use disorder, moderate, dependence (HCC) [F12.20] 01/21/2016  . Tobacco use disorder [F17.200] 01/21/2016  . Gout [M10.9] 01/21/2016  . Hidradenitis [L73.2] 01/21/2016   Total Time spent with patient: 25 min  Past Psychiatric History: See H&P  Past Medical History:  Past Medical History:  Diagnosis Date  . Gout   . Hidradenitis   . Hypertension   . Schizophrenia [facility])     Past Surgical History:  Procedure Laterality Date  . sweat gland removal     Family History:  Family History  Problem Relation Age of Onset  . Diabetes Mellitus II Mother   . Hypertension Mother    Family Psychiatric  History: See H&P Social History:  Social History   Substance and Sexual Activity  Alcohol Use Yes     Social History   Substance and Sexual Activity  Drug Use Yes  . Types: Marijuana    Social History   Socioeconomic History  . Marital status: Single    Spouse name: None  . Number of children: None  . Years of education: None  . Highest education level: None  Social Needs  . Financial resource strain: None  . Food insecurity - worry: None  . Food insecurity - inability: None  . Transportation needs - medical: None  . Transportation needs - non-medical: None  Occupational History  . None  Tobacco Use  . Smoking status: Current  Every Day Smoker    Packs/day: 1.00    Years: 9.00    Pack years: 9.00    Types: Cigarettes  . Smokeless tobacco: Never Used  Substance and Sexual Activity  . Alcohol use: Yes  . Drug use: Yes    Types: Marijuana  . Sexual activity: None  Other Topics Concern  . None  Social History Narrative  . None   Additional Social History:                         Sleep: Fair  Appetite:  Fair  Current Medications: Current Facility-Administered Medications  Medication Dose Route Frequency Provider Last Rate Last Dose  . acetaminophen (TYLENOL) tablet 650 mg  650 mg Oral Q6H PRN Clapacs, John T, MD      . allopurinol (ZYLOPRIM) tablet 100 mg  100 mg Oral Daily Clapacs, Jackquline Denmark, MD   100 mg at 10/25/17 0820  . alum & mag hydroxide-simeth (MAALOX/MYLANTA) 200-200-20 MG/5ML suspension 30 mL  30 mL Oral Q4H PRN Clapacs, John T, MD      . amantadine (SYMMETREL) capsule 100 mg  100 mg Oral BID Clapacs, Jackquline Denmark, MD   100 mg at 10/25/17 0820  . amLODipine (NORVASC) tablet 10 mg  10 mg Oral Daily Clapacs, Jackquline Denmark, MD   10 mg at 10/25/17 0820  . benazepril (LOTENSIN) tablet 40 mg  40 mg Oral Daily Clapacs, Jackquline Denmark, MD   40 mg  at 10/25/17 0820  . [START ON 10/26/2017] chlordiazePOXIDE (LIBRIUM) capsule 25 mg  25 mg Oral Daily McNew, Holly R, MD      . ciprofloxacin (CIPRO) tablet 500 mg  500 mg Oral BID McNew, Holly R, MD   500 mg at 10/25/17 0820  . haloperidol (HALDOL) tablet 5 mg  5 mg Oral QHS McNew, Holly R, MD   5 mg at 10/24/17 2122  . hydrOXYzine (ATARAX/VISTARIL) tablet 50 mg  50 mg Oral TID PRN Clapacs, John T, MD      . insulin aspart (novoLOG) injection 0-15 Units  0-15 Units Subcutaneous TID WC Clapacs, Jackquline Denmark, MD   3 Units at 10/25/17 1253  . magnesium hydroxide (MILK OF MAGNESIA) suspension 30 mL  30 mL Oral Daily PRN Clapacs, John T, MD      . multivitamin with minerals tablet 1 tablet  1 tablet Oral Daily McNew, Ileene Hutchinson, MD   1 tablet at 10/25/17 0820  . [START ON 10/27/2017]  paliperidone (INVEGA SUSTENNA) injection 156 mg  156 mg Intramuscular Once McNew, Holly R, MD      . paliperidone (INVEGA) 24 hr tablet 9 mg  9 mg Oral Daily Clapacs, Jackquline Denmark, MD   9 mg at 10/25/17 0820  . thiamine (VITAMIN B-1) tablet 100 mg  100 mg Oral Daily McNew, Ileene Hutchinson, MD   100 mg at 10/25/17 0820  . traZODone (DESYREL) tablet 150 mg  150 mg Oral QHS PRN McNew, Ileene Hutchinson, MD        Lab Results:  Results for orders placed or performed during the hospital encounter of 10/20/17 (from the past 48 hour(s))  Glucose, capillary     Status: Abnormal   Collection Time: 10/23/17  4:21 PM  Result Value Ref Range   Glucose-Capillary 108 (H) 65 - 99 mg/dL  Glucose, capillary     Status: Abnormal   Collection Time: 10/23/17  8:13 PM  Result Value Ref Range   Glucose-Capillary 128 (H) 65 - 99 mg/dL   Comment 1 Notify RN   Glucose, capillary     Status: Abnormal   Collection Time: 10/24/17  7:04 AM  Result Value Ref Range   Glucose-Capillary 110 (H) 65 - 99 mg/dL   Comment 1 Notify RN   Glucose, capillary     Status: Abnormal   Collection Time: 10/24/17 11:39 AM  Result Value Ref Range   Glucose-Capillary 159 (H) 65 - 99 mg/dL  Glucose, capillary     Status: Abnormal   Collection Time: 10/24/17  9:06 PM  Result Value Ref Range   Glucose-Capillary 137 (H) 65 - 99 mg/dL  Glucose, capillary     Status: Abnormal   Collection Time: 10/25/17  7:06 AM  Result Value Ref Range   Glucose-Capillary 122 (H) 65 - 99 mg/dL  Glucose, capillary     Status: Abnormal   Collection Time: 10/25/17 11:27 AM  Result Value Ref Range   Glucose-Capillary 139 (H) 65 - 99 mg/dL    Blood Alcohol level:  Lab Results  Component Value Date   ETH <10 10/20/2017   ETH <10 10/09/2017    Metabolic Disorder Labs: Lab Results  Component Value Date   HGBA1C 6.2 (H) 10/21/2017   MPG 131.24 10/21/2017   MPG 140 02/17/2017   Lab Results  Component Value Date   PROLACTIN 27.2 (H) 05/08/2016   PROLACTIN 6.9  01/19/2016   Lab Results  Component Value Date   CHOL 116 10/21/2017   TRIG  54 10/21/2017   HDL 33 (L) 10/21/2017   CHOLHDL 3.5 10/21/2017   VLDL 11 10/21/2017   LDLCALC 72 10/21/2017   LDLCALC 70 02/17/2017    Physical Findings: AIMS: Facial and Oral Movements Muscles of Facial Expression: None, normal Lips and Perioral Area: None, normal Jaw: None, normal Tongue: None, normal,Extremity Movements Upper (arms, wrists, hands, fingers): None, normal Lower (legs, knees, ankles, toes): None, normal, Trunk Movements Neck, shoulders, hips: None, normal, Overall Severity Severity of abnormal movements (highest score from questions above): None, normal Incapacitation due to abnormal movements: None, normal Patient's awareness of abnormal movements (rate only patient's report): No Awareness, Dental Status Current problems with teeth and/or dentures?: No Does patient usually wear dentures?: No  CIWA:  CIWA-Ar Total: 0 COWS:     Musculoskeletal: Strength & Muscle Tone: within normal limits Gait & Station: normal Patient leans: N/A  Psychiatric Specialty Exam: Physical Exam  Nursing note and vitals reviewed.   Review of Systems  All other systems reviewed and are negative.   Blood pressure (!) 128/95, pulse 94, temperature 98.1 F (36.7 C), temperature source Oral, resp. rate 18, height 5\' 8"  (1.727 m), weight 107 kg (236 lb), SpO2 100 %.Body mass index is 35.88 kg/m.  General Appearance: Casual  Eye Contact:  Minimal  Speech:  Garbled  Volume:  Decreased  Mood:  Depressed  Affect:  Congruent  Thought Process:  Disorganized  Orientation:  Other:  Not oriented to date or situation  Thought Content:  Illogical, paranoid  Suicidal Thoughts:  No  Homicidal Thoughts:  No  Memory:  Immediate;   Poor  Judgement:  Poor  Insight:  Lacking  Psychomotor Activity:  Normal  Concentration:  Concentration: Poor  Recall:  Poor  Fund of Knowledge:  Poor  Language:  Poor  Akathisia:   No      Assets:  Resilience Social Support  ADL's:  Impaired  Cognition:  Impaired  Sleep:  Number of Hours: 7.45     Treatment Plan Summary: 36 yo male with history of schizophrenia and medication noncompliance presents due to decompensation. He does have UTI which is being treated. WBC is improving.   Pt is guarded but less paranoid.  Plan:  Schizophrenia -Given Hinda GlatterNvega Sustenna 234 mg IM on 12/4. Next dose of 156 mg due on Tuesday -Continue Haldol 5 mg qhs -Continue Symmetrel  UTI -Continue Cipro 500 mg BID for 7 days -CBC improving to 13 from 17  HTN -Continue Norvasc and Benazepril  Possible alcohol w/d -Continue CIWA with Librium taper  Dispo -Aunt and mother are very supportive. I spoke with his mother yesterday. He lives alone. He was referred for ACT team for more support Beverly SessionsJagannath Luisana Lutzke, MD 10/25/2017, 1:14 PMPatient ID: Walter Ehlersharles L Husser Jr., male   DOB: May 17, 1981, 36 y.o.   MRN: [id number] Patient ID: Walter EhlersCharles L Knieriem Jr., male   DOB: May 17, 1981, 36 y.o.   MRN: [id number]

## 2017-10-26 LAB — GLUCOSE, CAPILLARY
GLUCOSE-CAPILLARY: 113 mg/dL — AB (ref 65–99)
GLUCOSE-CAPILLARY: 96 mg/dL (ref 65–99)
Glucose-Capillary: 127 mg/dL — ABNORMAL HIGH (ref 65–99)

## 2017-10-26 MED ORDER — PALIPERIDONE PALMITATE 156 MG/ML IM SUSP
156.0000 mg | Freq: Once | INTRAMUSCULAR | Status: AC
  Administered 2017-10-26: 156 mg via INTRAMUSCULAR
  Filled 2017-10-26: qty 1

## 2017-10-26 MED ORDER — PALIPERIDONE PALMITATE 234 MG/1.5ML IM SUSP: 234.0000 mg | INTRAMUSCULAR | Status: DC

## 2017-10-26 NOTE — [facility] Group Notes (Signed)
10/26/2017 1:00PM   Type of Therapy and Topic:  Group Therapy:  Overcoming Obstacles   Participation Level:  Minimal   Description of Group:   In this group patients will be encouraged to explore what they see as obstacles to their own wellness and recovery. They will be guided to discuss their thoughts, feelings, and behaviors related to these obstacles. The group will process together ways to cope with barriers, with attention given to specific choices patients can make. Each patient will be challenged to identify changes they are motivated to make in order to overcome their obstacles. This group will be process-oriented, with patients participating in exploration of their own experiences, giving and receiving support, and processing challenge from other group members.   Therapeutic Goals: 1. Patient will identify personal and current obstacles as they relate to admission. 2. Patient will identify barriers that currently interfere with their wellness or overcoming obstacles.  3. Patient will identify feelings, thought process and behaviors related to these barriers. 4. Patient will identify two changes they are willing to make to overcome these obstacles:      Summary of Patient Progress Pt continues to work towards their tx goals but has not yet reached them. Pt was able to appropriately participate in group discussion when prompted by CSW. Pt reported that his long term obstacle is "staying on medications. I always go off of them when I start to feel better." Pt reported one step he can take to overcome this obstacle is, "to remember that I'm feeling better because I'm on my meds, not because I don't need them."    Therapeutic Modalities:   Cognitive Behavioral Therapy Solution Focused Therapy Motivational Interviewing Relapse Prevention Therapy  Walter DachKelsey Mykenzie Pena, MSW, LCSW 10/26/2017 2:03 PM

## 2017-10-26 NOTE — Tx Team (Signed)
Interdisciplinary Treatment and Diagnostic Plan Update  10/26/2017 Time of Session: 10:30am Walter Headingharles Lewis Dudenhoeffer Jr. MRN: [id number]  Principal Diagnosis: Schizophrenia, undifferentiated (HCC)  Secondary Diagnoses: Principal Problem:   Schizophrenia, undifferentiated (HCC) Active Problems:   HTN (hypertension)   Cannabis use disorder, moderate, dependence (HCC)   Gout   Hidradenitis   Noncompliance   Alcohol use disorder, moderate, dependence (HCC)   Diabetes (HCC)   Current Medications:  Current Facility-Administered Medications  Medication Dose Route Frequency Provider Last Rate Last Dose  . acetaminophen (TYLENOL) tablet 650 mg  650 mg Oral Q6H PRN Clapacs, John T, MD      . allopurinol (ZYLOPRIM) tablet 100 mg  100 mg Oral Daily Clapacs, Jackquline DenmarkJohn T, MD   100 mg at 10/26/17 0829  . alum & mag hydroxide-simeth (MAALOX/MYLANTA) 200-200-20 MG/5ML suspension 30 mL  30 mL Oral Q4H PRN Clapacs, John T, MD      . amantadine (SYMMETREL) capsule 100 mg  100 mg Oral BID Clapacs, Jackquline DenmarkJohn T, MD   100 mg at 10/26/17 0829  . amLODipine (NORVASC) tablet 10 mg  10 mg Oral Daily Clapacs, Jackquline DenmarkJohn T, MD   10 mg at 10/26/17 0829  . benazepril (LOTENSIN) tablet 40 mg  40 mg Oral Daily Clapacs, Jackquline DenmarkJohn T, MD   40 mg at 10/26/17 [id number]  . ciprofloxacin (CIPRO) tablet 500 mg  500 mg Oral BID McNew, Ileene HutchinsonHolly R, MD   500 mg at 10/26/17 [id number]  . haloperidol (HALDOL) tablet 5 mg  5 mg Oral QHS McNew, Holly R, MD   5 mg at 10/25/17 2133  . hydrOXYzine (ATARAX/VISTARIL) tablet 50 mg  50 mg Oral TID PRN Clapacs, John T, MD      . insulin aspart (novoLOG) injection 0-15 Units  0-15 Units Subcutaneous TID WC Clapacs, Jackquline DenmarkJohn T, MD   3 Units at 10/25/17 1253  . magnesium hydroxide (MILK OF MAGNESIA) suspension 30 mL  30 mL Oral Daily PRN Clapacs, John T, MD      . multivitamin with minerals tablet 1 tablet  1 tablet Oral Daily McNew, Ileene HutchinsonHolly R, MD   1 tablet at 10/26/17 [phone]  . [START ON 10/27/2017] paliperidone (INVEGA SUSTENNA)  injection 156 mg  156 mg Intramuscular Once McNew, Holly R, MD      . paliperidone (INVEGA) 24 hr tablet 9 mg  9 mg Oral Daily Clapacs, John T, MD   9 mg at 10/26/17 0830  . thiamine (VITAMIN B-1) tablet 100 mg  100 mg Oral Daily McNew, Ileene HutchinsonHolly R, MD   100 mg at 10/26/17 0829  . traZODone (DESYREL) tablet 150 mg  150 mg Oral QHS PRN McNew, Ileene HutchinsonHolly R, MD       PTA Medications: Medications Prior to Admission  Medication Sig Dispense Refill Last Dose  . allopurinol (ZYLOPRIM) 100 MG tablet Take 100 mg by mouth daily.     Marland Kitchen. amantadine (SYMMETREL) 100 MG capsule Take 1 capsule (100 mg total) by mouth 2 (two) times daily. 60 capsule 0   . amLODipine (NORVASC) 10 MG tablet Take 10 mg by mouth daily.     . benazepril (LOTENSIN) 40 MG tablet Take 1 tablet by mouth daily.     . [EXPIRED] cephALEXin (KEFLEX) 500 MG capsule Take 1 capsule (500 mg total) by mouth 2 (two) times daily for 10 days. 20 capsule 0   . colchicine 0.6 MG tablet 1 tablet by mouth daily until gout pain is gone. 15 tablet 0 Taking  . haloperidol (HALDOL) 5 MG  tablet Take 1 tablet (5 mg total) by mouth at bedtime. 30 tablet 0   . oxyCODONE (ROXICODONE) 5 MG immediate release tablet Take 1 tablet (5 mg total) by mouth every 8 (eight) hours as needed. 15 tablet 0   . paliperidone (INVEGA SUSTENNA) 234 MG/1.5ML SUSP injection Inject 234 mg into the muscle once. Due May 7 1.5 mL 0   . paliperidone (INVEGA) 6 MG 24 hr tablet Take 2 tablets (12 mg total) by mouth at bedtime. 60 tablet 0   . predniSONE (DELTASONE) 10 MG tablet Take 1 tablet (10 mg total) by mouth daily. 6,5,4,3,2,1 six day taper 21 tablet 0   . traZODone (DESYREL) 150 MG tablet Take 1 tablet (150 mg total) by mouth at bedtime. 30 tablet 0     Patient Stressors: Financial difficulties Health problems Legal issue Occupational concerns Substance abuse Traumatic event  Patient Strengths: Active sense of humor Communication skills Work skills  Treatment Modalities:  Medication Management, Group therapy, Case management,  1 to 1 session with clinician, Psychoeducation, Recreational therapy.   Physician Treatment Plan for Primary Diagnosis: Schizophrenia, undifferentiated (HCC) Long Term Goal(s): Improvement in symptoms so as ready for discharge Improvement in symptoms so as ready for discharge   Short Term Goals: Ability to identify changes in lifestyle to reduce recurrence of condition will improve Ability to demonstrate self-control will improve Compliance with prescribed medications will improve Ability to identify triggers associated with substance abuse/mental health issues will improve  Medication Management: Evaluate patient's response, side effects, and tolerance of medication regimen.  Therapeutic Interventions: 1 to 1 sessions, Unit Group sessions and Medication administration.  Evaluation of Outcomes: Progressing  Physician Treatment Plan for Secondary Diagnosis: Principal Problem:   Schizophrenia, undifferentiated (HCC) Active Problems:   HTN (hypertension)   Cannabis use disorder, moderate, dependence (HCC)   Gout   Hidradenitis   Noncompliance   Alcohol use disorder, moderate, dependence (HCC)   Diabetes (HCC)  Long Term Goal(s): Improvement in symptoms so as ready for discharge Improvement in symptoms so as ready for discharge   Short Term Goals: Ability to identify changes in lifestyle to reduce recurrence of condition will improve Ability to demonstrate self-control will improve Compliance with prescribed medications will improve Ability to identify triggers associated with substance abuse/mental health issues will improve     Medication Management: Evaluate patient's response, side effects, and tolerance of medication regimen.  Therapeutic Interventions: 1 to 1 sessions, Unit Group sessions and Medication administration.  Evaluation of Outcomes: Progressing   RN Treatment Plan for Primary Diagnosis: Schizophrenia,  undifferentiated (HCC) Long Term Goal(s): Knowledge of disease and therapeutic regimen to maintain health will improve  Short Term Goals: Ability to participate in decision making will improve and Ability to identify and develop effective coping behaviors will improve  Medication Management: RN will administer medications as ordered by provider, will assess and evaluate patient's response and provide education to patient for prescribed medication. RN will report any adverse and/or side effects to prescribing provider.  Therapeutic Interventions: 1 on 1 counseling sessions, Psychoeducation, Medication administration, Evaluate responses to treatment, Monitor vital signs and CBGs as ordered, Perform/monitor CIWA, COWS, AIMS and Fall Risk screenings as ordered, Perform wound care treatments as ordered.  Evaluation of Outcomes: Progressing   LCSW Treatment Plan for Primary Diagnosis: Schizophrenia, undifferentiated (HCC) Long Term Goal(s): Safe transition to appropriate next level of care at discharge, Engage patient in therapeutic group addressing interpersonal concerns.  Short Term Goals: Engage patient in aftercare planning with referrals  and resources, Identify triggers associated with mental health/substance abuse issues and Increase skills for wellness and recovery  Therapeutic Interventions: Assess for all discharge needs, 1 to 1 time with Social worker, Explore available resources and support systems, Assess for adequacy in community support network, Educate family and significant other(s) on suicide prevention, Complete Psychosocial Assessment, Interpersonal group therapy.  Evaluation of Outcomes: Progressing   Progress in Treatment: Attending groups: Yes. Participating in groups: No. Taking medication as prescribed: Yes. Toleration medication: Yes. Family/Significant other contact made: No, will contact:    Patient understands diagnosis: Yes. Discussing patient identified  problems/goals with staff: Yes. Medical problems stabilized or resolved: Yes. Denies suicidal/homicidal ideation: No. Issues/concerns per patient self-inventory: No. Other:    New problem(s) identified: No, Describe:     New Short Term/Long Term Goal(s): Get stabilized back on the injection that he missed   Discharge Plan or Barriers: Home and follow up with [facility] Reason for Continuation of Hospitalization: Hallucinations Medication stabilization  Estimated Length of Stay: 1-2 days  Recreational Therapy: Patient Stressors: N/A Patient Goal: Patient will attend and participate in Recreation Therapy Group Sessions x5 days.  Attendees: Patient:Freman Pena 10/26/2017 12:50 PM  Physician: Braulio Conte Pucilowska,MD 10/26/2017 12:50 PM  Nursing: Hulan Amato, RN 10/26/2017 12:50 PM  RN Care Manager: 10/26/2017 12:50 PM  Social Worker: Jake Shark, LCSW 10/26/2017 12:50 PM  Recreational Therapist:  10/26/2017 12:50 PM  Other:  10/26/2017 12:50 PM  Other:  10/26/2017 12:50 PM  Other: 10/26/2017 12:50 PM    Scribe for Treatment Team: Glennon Mac, LCSW 10/26/2017 12:50 PM

## 2017-10-26 NOTE — Progress Notes (Signed)
Patient denied SI/HI/AVH. Patient maintained safety while on unit. Patient was appropriate. Patient adhered with scheduled medication.  

## 2017-10-26 NOTE — Plan of Care (Signed)
Patient up on the unit. He is social but thoughts are bizarre. He denies si,  hi, avh. Denies pain. He is med compliant. He is cooperative with care. He keeps asking to go home he has money, an apartment, and food at home. Safety maintained. Will continue to monitor.

## 2017-10-26 NOTE — Progress Notes (Signed)
[facility] MD Progress Note  10/26/2017 2:45 PM Walter Pena.  MRN:  [id number]   Subjective:    Walter Pena met with treatment team today as he insisted on being discharge immediately. He understood that his second injection of Invega, which he calls Haldol, is tomorrow. Due to adverse weather, transportation may be a problem tomorrow. He denies any symptoms of depression, psychosis or anxiety. He is relaxed and conversational. Tolerates medications well. Mother visited on the weekend. He is still delusional about his diabetes, unable to accept diagnosis, paranoid about his PCP.   Treatment plan. We will continue all medications as prescribed by Dr. Wonda Olds and give second injection of 156 mg of Invega sustenna on 10/27/2017. We recomend ACT team.  Social/disposition. He will be discharged to his apartment. Follow up with [facility].   Principal Problem: Schizophrenia, undifferentiated ([place]) Diagnosis:   Patient Active Problem List   Diagnosis Date Noted  . 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 Diabetes ([place]) [E11.9] 02/18/2017  . Schizophrenia ([place]) [F20.9] 02/17/2017  . Alcohol use disorder, moderate, dependence ([place]) [F10.20] 02/17/2017  . Noncompliance [Z91.19] 05/07/2016  . HTN (hypertension) [I10] 01/21/2016  . Cannabis use disorder, moderate, dependence ([place]) [F12.20] 01/21/2016  . Tobacco use disorder [F17.200] 01/21/2016  . Gout [M10.9] 01/21/2016  . Hidradenitis [L73.2] 01/21/2016   Total Time spent with patient: 25 min  Past Psychiatric History: schizophrenia  Past Medical History:  Past Medical History:  Diagnosis Date  . Gout   . Hidradenitis   . Hypertension   . Schizophrenia [facility])     Past Surgical History:  Procedure Laterality Date  . sweat gland removal     Family History:  Family History  Problem Relation Age of Onset  . Diabetes Mellitus II Mother   . Hypertension Mother    Family Psychiatric  History: none  reported Social History:  Social History   Substance and Sexual Activity  Alcohol Use Yes     Social History   Substance and Sexual Activity  Drug Use Yes  . Types: Marijuana    Social History   Socioeconomic History  . Marital status: Single    Spouse name: None  . Number of children: None  . Years of education: None  . Highest education level: None  Social Needs  . Financial resource strain: None  . Food insecurity - worry: None  . Food insecurity - inability: None  . Transportation needs - medical: None  . Transportation needs - non-medical: None  Occupational History  . None  Tobacco Use  . Smoking status: Current Every Day Smoker    Packs/day: 1.00    Years: 9.00    Pack years: 9.00    Types: Cigarettes  . Smokeless tobacco: Never Used  Substance and Sexual Activity  . Alcohol use: Yes  . Drug use: Yes    Types: Marijuana  . Sexual activity: None  Other Topics Concern  . None  Social History Narrative  . None   Additional Social History:                         Sleep: Fair  Appetite:  Fair  Current Medications: Current Facility-Administered Medications  Medication Dose Route Frequency Provider Last Rate Last Dose  . acetaminophen (TYLENOL) tablet 650 mg  650 mg Oral Q6H PRN Clapacs, John T, MD      . allopurinol (ZYLOPRIM) tablet 100 mg  100 mg Oral  Daily Clapacs, Madie Reno, MD   100 mg at 10/26/17 0829  . alum & mag hydroxide-simeth (MAALOX/MYLANTA) 200-200-20 MG/5ML suspension 30 mL  30 mL Oral Q4H PRN Clapacs, John T, MD      . amantadine (SYMMETREL) capsule 100 mg  100 mg Oral BID Clapacs, Madie Reno, MD   100 mg at 10/26/17 0829  . amLODipine (NORVASC) tablet 10 mg  10 mg Oral Daily Clapacs, Madie Reno, MD   10 mg at 10/26/17 0829  . benazepril (LOTENSIN) tablet 40 mg  40 mg Oral Daily Clapacs, Madie Reno, MD   40 mg at 10/26/17 5852  . ciprofloxacin (CIPRO) tablet 500 mg  500 mg Oral BID McNew, Tyson Babinski, MD   500 mg at 10/26/17 7782  . haloperidol  (HALDOL) tablet 5 mg  5 mg Oral QHS McNew, Holly R, MD   5 mg at 10/25/17 2133  . hydrOXYzine (ATARAX/VISTARIL) tablet 50 mg  50 mg Oral TID PRN Clapacs, John T, MD      . insulin aspart (novoLOG) injection 0-15 Units  0-15 Units Subcutaneous TID WC Clapacs, Madie Reno, MD   3 Units at 10/25/17 1253  . magnesium hydroxide (MILK OF MAGNESIA) suspension 30 mL  30 mL Oral Daily PRN Clapacs, John T, MD      . multivitamin with minerals tablet 1 tablet  1 tablet Oral Daily McNew, Tyson Babinski, MD   1 tablet at 10/26/17 [phone]  . [START ON 10/27/2017] paliperidone (INVEGA SUSTENNA) injection 156 mg  156 mg Intramuscular Once McNew, Holly R, MD      . paliperidone (INVEGA) 24 hr tablet 9 mg  9 mg Oral Daily Clapacs, John T, MD   9 mg at 10/26/17 0830  . thiamine (VITAMIN B-1) tablet 100 mg  100 mg Oral Daily McNew, Tyson Babinski, MD   100 mg at 10/26/17 0829  . traZODone (DESYREL) tablet 150 mg  150 mg Oral QHS PRN McNew, Tyson Babinski, MD        Lab Results:  Results for orders placed or performed during the hospital encounter of 10/20/17 (from the past 48 hour(s))  Glucose, capillary     Status: Abnormal   Collection Time: 10/24/17  9:06 PM  Result Value Ref Range   Glucose-Capillary 137 (H) 65 - 99 mg/dL  Glucose, capillary     Status: Abnormal   Collection Time: 10/25/17  7:06 AM  Result Value Ref Range   Glucose-Capillary 122 (H) 65 - 99 mg/dL  Glucose, capillary     Status: Abnormal   Collection Time: 10/25/17 11:27 AM  Result Value Ref Range   Glucose-Capillary 139 (H) 65 - 99 mg/dL  Glucose, capillary     Status: Abnormal   Collection Time: 10/25/17  8:15 PM  Result Value Ref Range   Glucose-Capillary 109 (H) 65 - 99 mg/dL   Comment 1 Notify RN   Glucose, capillary     Status: Abnormal   Collection Time: 10/26/17  7:12 AM  Result Value Ref Range   Glucose-Capillary 113 (H) 65 - 99 mg/dL   Comment 1 Notify RN     Blood Alcohol level:  Lab Results  Component Value Date   ETH <10 10/20/2017   ETH <10  36/14/4315    Metabolic Disorder Labs: Lab Results  Component Value Date   HGBA1C 6.2 (H) 10/21/2017   MPG 131.24 10/21/2017   MPG 140 02/17/2017   Lab Results  Component Value Date   PROLACTIN 27.2 (H) 05/08/2016  PROLACTIN 6.9 01/19/2016   Lab Results  Component Value Date   CHOL 116 10/21/2017   TRIG 54 10/21/2017   HDL 33 (L) 10/21/2017   CHOLHDL 3.5 10/21/2017   VLDL 11 10/21/2017   LDLCALC 72 10/21/2017   LDLCALC 70 02/17/2017    Physical Findings: AIMS: Facial and Oral Movements Muscles of Facial Expression: None, normal Lips and Perioral Area: None, normal Jaw: None, normal Tongue: None, normal,Extremity Movements Upper (arms, wrists, hands, fingers): None, normal Lower (legs, knees, ankles, toes): None, normal, Trunk Movements Neck, shoulders, hips: None, normal, Overall Severity Severity of abnormal movements (highest score from questions above): None, normal Incapacitation due to abnormal movements: None, normal Patient's awareness of abnormal movements (rate only patient's report): No Awareness, Dental Status Current problems with teeth and/or dentures?: No Does patient usually wear dentures?: No  CIWA:  CIWA-Ar Total: 0 COWS:     Musculoskeletal: Strength & Muscle Tone: within normal limits Gait & Station: normal Patient leans: N/A  Psychiatric Specialty Exam: Physical Exam  Nursing note and vitals reviewed. Psychiatric: He has a normal mood and affect. His speech is normal and behavior is normal. Thought content normal. Cognition and memory are normal. He expresses impulsivity.    Review of Systems  Neurological: Negative.   Psychiatric/Behavioral: Negative.   All other systems reviewed and are negative.   Blood pressure (!) 128/103, pulse 92, temperature 97.7 F (36.5 C), resp. rate 18, height _0  (1.727 m), weight 107 kg (236 lb), SpO2 100 %.Body mass index is 35.88 kg/m.  General Appearance: Casual  Eye Contact:  Good  Speech:  Clear  and Coherent  Volume:  Normal  Mood:  Euthymic  Affect:  Appropriate  Thought Process:  Goal Directed and Descriptions of Associations: Intact  Orientation:  Other:  Not oriented to date or situation  Thought Content:  Delusions and Paranoid Ideation,   Suicidal Thoughts:  No  Homicidal Thoughts:  No  Memory:  Immediate;   Fair Recent;   Fair Remote;   Fair  Judgement:  Poor  Insight:  Shallow  Psychomotor Activity:  Normal  Concentration:  Concentration: Fair and Attention Span: Fair  Recall:  [employer] of Knowledge:  Fair  Language:  Fair  Akathisia:  No      Assets:  Communication Skills Desire for Improvement Financial Resources/Insurance Housing Physical Health Resilience Social Support  ADL's:  Intact  Cognition:  intact  Sleep:  Number of Hours: 7.45     Treatment Plan Summary: 36 yo male with history of schizophrenia and medication noncompliance presents due to decompensation. He does have UTI which is being treated. WBC is improving.   Pt is guarded but less paranoid.  Plan:  Schizophrenia -Given Lorayne Bender Sustenna 234 mg IM on 12/4. Next dose of 156 mg due on Tuesday -Continue Haldol 5 mg qhs -Continue Symmetrel  UTI -Continue Cipro 500 mg BID for 7 days -CBC improving to 13 from 17  HTN -Continue Norvasc and Benazepril  Possible alcohol w/d -completed Librium taper, VS are stable   Dispo -Aunt and mother are very supportive. I spoke with his mother yesterday. He lives alone. He was referred for ACT team for more support Orson Slick, MD 10/26/2017, 2:45 PM

## 2017-10-27 LAB — GLUCOSE, CAPILLARY
Glucose-Capillary: 112 mg/dL — ABNORMAL HIGH (ref 65–99)
Glucose-Capillary: 151 mg/dL — ABNORMAL HIGH (ref 65–99)

## 2017-10-27 MED ORDER — BENAZEPRIL HCL 40 MG PO TABS: 40.0000 mg | ORAL_TABLET | Freq: Every day | ORAL | 0 refills | Status: DC

## 2017-10-27 MED ORDER — AMLODIPINE BESYLATE 10 MG PO TABS: 10.0000 mg | ORAL_TABLET | Freq: Every day | ORAL | 0 refills | Status: DC

## 2017-10-27 MED ORDER — PALIPERIDONE PALMITATE 234 MG/1.5ML IM SUSP: 234.0000 mg | Freq: Once | INTRAMUSCULAR | 0 refills | Status: DC

## 2017-10-27 MED ORDER — AMANTADINE HCL 100 MG PO CAPS: 100.0000 mg | ORAL_CAPSULE | Freq: Two times a day (BID) | ORAL | 0 refills | Status: DC

## 2017-10-27 MED ORDER — CIPROFLOXACIN HCL 500 MG PO TABS: 500.0000 mg | ORAL_TABLET | Freq: Two times a day (BID) | ORAL | 0 refills | Status: AC

## 2017-10-27 MED ORDER — TRAZODONE HCL 150 MG PO TABS: 150.0000 mg | ORAL_TABLET | Freq: Every evening | ORAL | 0 refills | Status: DC | PRN

## 2017-10-27 MED ORDER — HALOPERIDOL 5 MG PO TABS: 5.0000 mg | ORAL_TABLET | Freq: Every day | ORAL | 0 refills | Status: DC

## 2017-10-27 NOTE — Discharge Summary (Signed)
Physician Discharge Summary Note  Patient:  Walter Pena. is an 36 y.o., male MRN:  [id number] DOB:  [birth year] Patient phone:  [phone] (home)  Patient address:   [address] [place] [address],  Total Time spent with patient: 20 minutes  Plus 20 minutes of medication reconciliation, discharge planning, and discharge documentation   Date of Admission:  10/20/2017 Date of Discharge: 10/27/17  Reason for Admission:  Decompensation of schizophrenia  Principal Problem: Schizophrenia, undifferentiated ([facility]) Discharge Diagnoses: Patient Active Problem List   Diagnosis Date Noted  . Schizophrenia, undifferentiated ([facility]) [F20.3] 10/20/2017  . Diabetes ([facility]) [E11.9] 02/18/2017  . Schizophrenia ([facility]) [F20.9] 02/17/2017  . Alcohol use disorder, moderate, dependence ([facility]) [F10.20] 02/17/2017  . Noncompliance [Z91.19] 05/07/2016  . HTN (hypertension) [I10] 01/21/2016  . Cannabis use disorder, moderate, dependence ([facility]) [F12.20] 01/21/2016  . Tobacco use disorder [F17.200] 01/21/2016  . Gout [M10.9] 01/21/2016  . Hidradenitis [L73.2] 01/21/2016    Past Psychiatric History: See H&P  Past Medical History:  Past Medical History:  Diagnosis Date  . Gout   . Hidradenitis   . Hypertension   . Schizophrenia [facility])     Past Surgical History:  Procedure Laterality Date  . sweat gland removal     Family History:  Family History  Problem Relation Age of Onset  . Diabetes Mellitus II Mother   . Hypertension Mother    Family Psychiatric  History: See H&P Social History:  Social History   Substance and Sexual Activity  Alcohol Use Yes     Social History   Substance and Sexual Activity  Drug Use Yes  . Types: Marijuana    Social History   Socioeconomic History  . Marital status: Single    Spouse name: None  . Number of children: None  . Years of education: None  . Highest education level: None  Social Needs  . Financial resource strain: None  .  Food insecurity - worry: None  . Food insecurity - inability: None  . Transportation needs - medical: None  . Transportation needs - non-medical: None  Occupational History  . None  Tobacco Use  . Smoking status: Current Every Day Smoker    Packs/day: 1.00    Years: 9.00    Pack years: 9.00    Types: Cigarettes  . Smokeless tobacco: Never Used  Substance and Sexual Activity  . Alcohol use: Yes  . Drug use: Yes    Types: Marijuana  . Sexual activity: None  Other Topics Concern  . None  Social History Narrative  . None    Hospital Course:  Pt was given Gean Birchwood injection 234 mg at [facility] prior to arrival. He received the second dose of 156 mg on 10/26/17. Pt was also found to have UTI and was started on Cipro for total of 7 days. On day of discharge, pt was much more verbal and no longer confused. HE was oriented to person, place, and time. He was able to verbalize his needs and the medications he is on. He states that he has his own apartment and his aunt is his payee. His bills are all taken care of. He has showered while in the hospital and showing better hygiene. He is organized and goal directed. He denied SI, HI, AH, VH. He did not appear to be responding to internal stimuli. HE requested discharge and showed significant improvements in mental status. He agreed to continue to follow up with [facility].   Physical Findings: AIMS:  Facial and Oral Movements Muscles of Facial Expression: None, normal Lips and Perioral Area: None, normal Jaw: None, normal Tongue: None, normal,Extremity Movements Upper (arms, wrists, hands, fingers): None, normal Lower (legs, knees, ankles, toes): None, normal, Trunk Movements Neck, shoulders, hips: None, normal, Overall Severity Severity of abnormal movements (highest score from questions above): None, normal Incapacitation due to abnormal movements: None, normal Patient's awareness of abnormal movements (rate only patient's report): No Awareness,  Dental Status Current problems with teeth and/or dentures?: No Does patient usually wear dentures?: No  CIWA:  CIWA-Ar Total: 0 COWS:     Musculoskeletal: Strength & Muscle Tone: within normal limits Gait & Station: normal Patient leans: N/A  Psychiatric Specialty Exam: Physical Exam  Nursing note and vitals reviewed.   ROS  Blood pressure 118/73, pulse 87, temperature 97.7 F (36.5 C), temperature source Oral, resp. rate 18, height 5\' 8"  (1.727 m), weight 107 kg (236 lb), SpO2 100 %.Body mass index is 35.88 kg/m.  General Appearance: Fairly Groomed  Eye Contact:  Good  Speech:  Clear and Coherent  Volume:  Normal  Mood:  Euthymic  Affect:  Congruent  Thought Process:  Goal Directed  Orientation:  Full (Time, Place, and Person)  Thought Content:  Negative  Suicidal Thoughts:  No  Homicidal Thoughts:  No  Memory:  Immediate;   Fair  Judgement:  Fair  Insight:  Fair  Psychomotor Activity:  Normal  Concentration:  Concentration: Fair  Recall:  Fair  Fund of Knowledge:  Fair  Language:  Fair  Akathisia:  No      Assets:  Communication Skills Desire for Improvement  ADL's:  Intact  Cognition:  WNL  Sleep:  Number of Hours: 7.45     Have you used any form of tobacco in the last 30 days? (Cigarettes, Smokeless Tobacco, Cigars, and/or Pipes): No  Has this patient used any form of tobacco in the last 30 days? (Cigarettes, Smokeless Tobacco, Cigars, and/or Pipes) No  Blood Alcohol level:  Lab Results  Component Value Date   ETH <10 10/20/2017   ETH <10 10/09/2017    Metabolic Disorder Labs:  Lab Results  Component Value Date   HGBA1C 6.2 (H) 10/21/2017   MPG 131.24 10/21/2017   MPG 140 02/17/2017   Lab Results  Component Value Date   PROLACTIN 27.2 (H) 05/08/2016   PROLACTIN 6.9 01/19/2016   Lab Results  Component Value Date   CHOL 116 10/21/2017   TRIG 54 10/21/2017   HDL 33 (L) 10/21/2017   CHOLHDL 3.5 10/21/2017   VLDL 11 10/21/2017   LDLCALC 72  10/21/2017   LDLCALC 70 02/17/2017    See Psychiatric Specialty Exam and Suicide Risk Assessment completed by Attending Physician prior to discharge.  Discharge destination:  Home  Is patient on multiple antipsychotic therapies at discharge:  Yes,   Do you recommend tapering to monotherapy for antipsychotics?  Yes   Has Patient had three or more failed trials of antipsychotic monotherapy by history:  No  Recommended Plan for Multiple Antipsychotic Therapies: Taper off oral Haldol if able to tolerate   Allergies as of 10/27/2017      Reactions   Bactrim [sulfamethoxazole-trimethoprim] Nausea And Vomiting   Lisinopril Swelling   Other Rash   aloe      Medication List    STOP taking these medications   cephALEXin 500 MG capsule Commonly known as:  KEFLEX   oxyCODONE 5 MG immediate release tablet Commonly known as:  ROXICODONE  paliperidone 6 MG 24 hr tablet Commonly known as:  INVEGA   predniSONE 10 MG tablet Commonly known as:  DELTASONE     TAKE these medications     Indication  allopurinol 100 MG tablet Commonly known as:  ZYLOPRIM Take 100 mg by mouth daily.  Indication:  gout   amantadine 100 MG capsule Commonly known as:  SYMMETREL Take 1 capsule (100 mg total) by mouth 2 (two) times daily.  Indication:  Extrapyramidal Reaction caused by Medications   amLODipine 10 MG tablet Commonly known as:  NORVASC Take 1 tablet (10 mg total) by mouth daily.  Indication:  High Blood Pressure Disorder   benazepril 40 MG tablet Commonly known as:  LOTENSIN Take 1 tablet (40 mg total) by mouth daily.  Indication:  High Blood Pressure Disorder   ciprofloxacin 500 MG tablet Commonly known as:  CIPRO Take 1 tablet (500 mg total) by mouth 2 (two) times daily for 1 day.  Indication:  UTI   colchicine 0.6 MG tablet 1 tablet by mouth daily until gout pain is gone.  Indication:  Gout   haloperidol 5 MG tablet Commonly known as:  HALDOL Take 1 tablet (5 mg total)  by mouth at bedtime.  Indication:  Psychosis   paliperidone 234 MG/1.5ML Susp injection Commonly known as:  INVEGA SUSTENNA Inject 234 mg into the muscle once for 1 dose. Due May 7  Indication:  Schizophrenia   traZODone 150 MG tablet Commonly known as:  DESYREL Take 1 tablet (150 mg total) by mouth at bedtime as needed for sleep. What changed:    when to take this  reasons to take this  Indication:  Trouble Sleeping        Follow-up recommendations:  Follow up with [facility]  Signed: Haskell RilingHolly R McNew, MD 10/27/2017, 9:35 AM

## 2017-10-27 NOTE — Progress Notes (Signed)
Patient discharged and picked up by his mother. Patient will follow up with [facility] on tomorrow.

## 2017-10-27 NOTE — Progress Notes (Signed)
Recreation Therapy Notes  Date: 12.11.2018  Time: 9:30 am  Location: Craft Room  Behavioral response: N/A  Intervention Topic: Self-esteem  Discussion/Intervention: Patient did not attend group. Clinical Observations/Feedback:  Patient did not attend group.  Tallen Schnorr LRT/CTRS         Yaneisy Wenz 10/27/2017 11:34 AM

## 2017-10-27 NOTE — Progress Notes (Signed)
Recreation Therapy Notes  INPATIENT RECREATION TR PLAN  Patient Details Name: Tali Coster. MRN: [id number] DOB: Jul 22, 1981 Today's Date: 10/27/2017  Rec Therapy Plan Is patient appropriate for Therapeutic Recreation?: Yes Treatment times per week: at least 3 Estimated Length of Stay: 5-7 days TR Treatment/Interventions: Group participation (Comment)(Appropriate participation in recreation therapy tx. )  Discharge Criteria Pt will be discharged from therapy if:: Discharged Treatment plan/goals/alternatives discussed and agreed upon by:: Patient/family  Discharge Summary Short term goals set: Patient will attend and participate in Recreation Therapy Group Sessions x5 days. Short term goals met: Not met Reason goals not met: Patient did not attend groups. Therapeutic equipment acquired: N/A Reason patient discharged from therapy: Discharge from hospital Pt/family agrees with progress & goals achieved: Yes Date patient discharged from therapy: 10/27/17   Jaaron Oleson 10/27/2017, 11:35 AM

## 2017-10-27 NOTE — [facility] Group Notes (Signed)
10/27/2017 9:30am  Type of Therapy/Topic:  Group Therapy:  Feelings about Diagnosis  Participation Level:  Active   Description of Group:   This group will allow patients to explore their thoughts and feelings about diagnoses they have received. Patients will be guided to explore their level of understanding and acceptance of these diagnoses. Facilitator will encourage patients to process their thoughts and feelings about the reactions of others to their diagnosis and will guide patients in identifying ways to discuss their diagnosis with significant others in their lives. This group will be process-oriented, with patients participating in exploration of their own experiences, giving and receiving support, and processing challenge from other group members.   Therapeutic Goals: 1. Patient will demonstrate understanding of diagnosis as evidenced by identifying two or more symptoms of the disorder 2. Patient will be able to express two feelings regarding the diagnosis 3. Patient will demonstrate their ability to communicate their needs through discussion and/or role play  Summary of Patient Progress:  Actively participated, came to group late. Walter Pena says that he experiences symptoms such as "not being able to sleep and always walking around" as a symptom of his diagnosis. He mentions how he knows that he "messed up when I stopped taking my medications thinking that I was better". He states that when initially diagnosed he felt "accepting of the diagnosis"  He says that he knows now the importance of communicating with his ACT Team. Mood was good, dozed off on and off, thinking based in reality.       Therapeutic Modalities:   Cognitive Behavioral Therapy Brief Therapy Feelings Identification    Johny ShearsCassandra  Gean Laursen, LCSW 10/27/2017 10:36 AM

## 2017-10-27 NOTE — [facility] Suicide Risk Assessment (Signed)
[facility] Discharge Suicide Risk Assessment   Principal Problem: Schizophrenia, undifferentiated (HCC) Discharge Diagnoses:  Patient Active Problem List   Diagnosis Date Noted  . Schizophrenia, undifferentiated (HCC) [F20.3] 10/20/2017  . Diabetes (HCC) [E11.9] 02/18/2017  . Schizophrenia (HCC) [F20.9] 02/17/2017  . Alcohol use disorder, moderate, dependence (HCC) [F10.20] 02/17/2017  . Noncompliance [Z91.19] 05/07/2016  . HTN (hypertension) [I10] 01/21/2016  . Cannabis use disorder, moderate, dependence (HCC) [F12.20] 01/21/2016  . Tobacco use disorder [F17.200] 01/21/2016  . Gout [M10.9] 01/21/2016  . Hidradenitis [L73.2] 01/21/2016    Total Time spent with patient: 20 minutes    Mental Status Per Nursing Assessment::   On Admission:  NA  Demographic Factors:  Male and Living alone  Loss Factors: NA  Historical Factors: NA  Risk Reduction Factors:   Positive social support, Positive therapeutic relationship and Positive coping skills or problem solving skills  Continued Clinical Symptoms:  None  Cognitive Features That Contribute To Risk:  None    Suicide Risk:  Minimal: No identifiable suicidal ideation.  Patients presenting with no risk factors but with morbid ruminations; may be classified as minimal risk based on the severity of the depressive symptoms    Plan Of Care/Follow-up recommendations:  Follow up with [facility]. Next [place] injection will be 11/23/2017  Haskell Riling, MD 10/27/2017, 9:42 AM

## 2017-10-27 NOTE — Progress Notes (Signed)
Patient scheduled for discharge on above date and time. Personal items returned along with prescriptions. Verbally denies SI, affect is appropriate. To be picked up by his mother. Verbally stated that he understood the discharge instructions.

## 2017-10-27 NOTE — Progress Notes (Signed)
  [facility] Case Management Discharge Plan :  Will you be returning to the same living situation after discharge:  Yes,    At discharge, do you have transportation home?: Yes,   mother Do you have the ability to pay for your medications: Yes,     Release of information consent forms completed and in the chart;  Patient's signature needed at discharge.  Patient to Follow up at: Follow-up Information    [address] [facility] [place] Go on 10/28/2017.   Why:  10:30am  Please meet with [facility] team at their office to begin services.  After this initial appointment they will be able to meet you at your home. Contact information: [address] [place] [address] [phone]           Next level of care provider has access to [facility] Link:no  Safety Planning and Suicide Prevention discussed: Yes,     Have you used any form of tobacco in the last 30 days? (Cigarettes, Smokeless Tobacco, Cigars, and/or Pipes): No  Has patient been referred to the Quitline?: N/A patient is not a smoker  Patient has been referred for addiction treatment: Yes  Glennon Mac, LCSW 10/27/2017, 1:36 PM

## 2017-11-16 ENCOUNTER — Emergency Department
Admission: EM | Admit: 2017-11-16 | Discharge: 2017-11-16 | Disposition: A | Discharge status: Home / Self Care | Attending: Emergency Medicine | Admitting: Emergency Medicine

## 2017-11-16 ENCOUNTER — Encounter: Payer: Self-pay | Admitting: Emergency Medicine

## 2017-11-16 DIAGNOSIS — F17210 Nicotine dependence, cigarettes, uncomplicated: Secondary | ICD-10-CM | POA: Diagnosis not present

## 2017-11-16 DIAGNOSIS — Z79899 Other long term (current) drug therapy: Secondary | ICD-10-CM | POA: Diagnosis not present

## 2017-11-16 DIAGNOSIS — D841 Defects in the complement system: Secondary | ICD-10-CM | POA: Diagnosis not present

## 2017-11-16 DIAGNOSIS — I10 Essential (primary) hypertension: Secondary | ICD-10-CM | POA: Diagnosis not present

## 2017-11-16 DIAGNOSIS — E119 Type 2 diabetes mellitus without complications: Secondary | ICD-10-CM | POA: Insufficient documentation

## 2017-11-16 DIAGNOSIS — R600 Localized edema: Secondary | ICD-10-CM | POA: Diagnosis present

## 2017-11-16 MED ORDER — EPINEPHRINE 0.3 MG/0.3ML IJ SOAJ
0.3000 mg | Freq: Once | INTRAMUSCULAR | Status: AC
  Administered 2017-11-16: 0.3 mg via INTRAMUSCULAR

## 2017-11-16 MED ORDER — EPINEPHRINE 0.3 MG/0.3ML IJ SOAJ
INTRAMUSCULAR | Status: AC
  Administered 2017-11-16: 0.3 mg via INTRAMUSCULAR
  Filled 2017-11-16: qty 0.3

## 2017-11-16 NOTE — ED Triage Notes (Signed)
Pt from jail with lips swelling, throat tightness that started after lunch. Pt given 50 mg benadryl at 1530 and 50 prednisone at 1630. Swelling has continued to get worse.

## 2017-11-16 NOTE — ED Provider Notes (Signed)
[facility] Emergency Department Provider Note  ____________________________________________   First MD Initiated Contact with Patient 11/16/17 1841     (approximate)  I have reviewed the triage vital signs and the nursing notes.   HISTORY  Chief Complaint Angioedema   HPI Walter Pena. is a 36 y.o. male is brought [facility] the emergency department from jail for swelling to his upper lip for the past several hours.  He said his symptoms began gradually shortly after lunch.  He feels like they are continuing to get worse.  He has had no shortness of breath no drooling.  He feels like his voice however is "scratchy" and not normal.  He formerly took an ACE inhibitor but has not taken one in several years.  His symptoms had insidious onset and have been slowly progressive.  Nothing seems to make them better or worse.  He received 50 mg of prednisone as well as 50 mg of Benadryl prior to arrival with no effect.  He says he does have a family history of the same.  Past Medical History:  Diagnosis Date  . Gout   . Hidradenitis   . Hypertension   . Schizophrenia [facility])     Patient Active Problem List   Diagnosis Date Noted  . 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 Diabetes (HCC) 02/18/2017  . Schizophrenia (HCC) 02/17/2017  . Alcohol use disorder, moderate, dependence (HCC) 02/17/2017  . Noncompliance 05/07/2016  . HTN (hypertension) 01/21/2016  . Cannabis use disorder, moderate, dependence (HCC) 01/21/2016  . Tobacco use disorder 01/21/2016  . Gout 01/21/2016  . Hidradenitis 01/21/2016    Past Surgical History:  Procedure Laterality Date  . sweat gland removal      Prior to Admission medications   Medication Sig Start Date End Date Taking? Authorizing Provider  allopurinol (ZYLOPRIM) 100 MG tablet Take 100 mg by mouth daily. 01/21/17  Yes [provider]  amantadine (SYMMETREL) 100 MG capsule Take 1 capsule (100 mg total) by  mouth 2 (two) times daily. 10/27/17  Yes McNew, Ileene Hutchinson, MD  amLODipine (NORVASC) 10 MG tablet Take 1 tablet (10 mg total) by mouth daily. 10/27/17  Yes McNew, Ileene Hutchinson, MD  benazepril (LOTENSIN) 40 MG tablet Take 1 tablet (40 mg total) by mouth daily. 10/27/17  Yes McNew, Ileene Hutchinson, MD  benztropine (COGENTIN) 1 MG tablet Take 1 mg by mouth daily.   Yes [provider]  colchicine 0.6 MG tablet 1 tablet by mouth daily until gout pain is gone. 04/24/16  Yes Joni Reining, PA-C  haloperidol (HALDOL) 5 MG tablet Take 1 tablet (5 mg total) by mouth at bedtime. 10/27/17  Yes McNew, Ileene Hutchinson, MD  paliperidone (INVEGA SUSTENNA) 156 MG/ML SUSP injection Inject 156 mg into the muscle once.   Yes [provider]  paliperidone (INVEGA SUSTENNA) 234 MG/1.5ML SUSP injection Inject 234 mg into the muscle once for 1 dose. Due May 7 Patient not taking: Reported on 11/16/2017 10/27/17 11/16/17  Haskell Riling, MD  traZODone (DESYREL) 150 MG tablet Take 1 tablet (150 mg total) by mouth at bedtime as needed for sleep. Patient not taking: Reported on 11/16/2017 10/27/17   Haskell Riling, MD    Allergies Bactrim [sulfamethoxazole-trimethoprim]; Lisinopril; and Other  Family History  Problem Relation Age of Onset  . Diabetes Mellitus II Mother   . Hypertension Mother     Social History Social History   Tobacco Use  . Smoking status: Current Every Day  Smoker    Packs/day: 1.00    Years: 9.00    Pack years: 9.00    Types: Cigarettes  . Smokeless tobacco: Never Used  Substance Use Topics  . Alcohol use: Yes  . Drug use: Yes    Types: Marijuana    Review of Systems Constitutional: No fever/chills Eyes: No visual changes. ENT: No sore throat. Cardiovascular: Denies chest pain. Respiratory: Denies shortness of breath. Gastrointestinal: No abdominal pain.  No nausea, no vomiting.  No diarrhea.  No constipation. Genitourinary: Negative for dysuria. Musculoskeletal: Negative for back  pain. Skin: Negative for rash. Neurological: Negative for headaches, focal weakness or numbness.   ____________________________________________   PHYSICAL EXAM:  VITAL SIGNS: ED Triage Vitals [11/16/17 1833]  Enc Vitals Group     BP (!) 132/91     Pulse Rate 69     Resp 17     Temp 98.4 F (36.9 C)     Temp Source Oral     SpO2 98 %     Weight 245 lb (111.1 kg)     Height 5\' 9"  (1.753 m)     Head Circumference      Peak Flow      Pain Score 10     Pain Loc      Pain Edu?      Excl. in GC?     Constitutional: Alert and oriented x4 pleasant cooperative speaks in full clear sentences no diaphoresis Eyes: PERRL EOMI. Head: Atraumatic. Nose: No congestion/rhinnorhea. Mouth/Throat: Angioedema to upper lip not the lower lip not the tongue no trismus no posterior involvement Neck: No stridor.   Cardiovascular: Normal rate, regular rhythm. Grossly normal heart sounds.  Good peripheral circulation. Respiratory: Normal respiratory effort.  No retractions. Lungs CTAB and moving good air Gastrointestinal: Soft nontender Musculoskeletal: No lower extremity edema   Neurologic:  Normal speech and language. No gross focal neurologic deficits are appreciated. Skin:  Skin is warm, dry and intact. No rash noted. Psychiatric: Mood and affect are normal. Speech and behavior are normal.    ____________________________________________   DIFFERENTIAL includes but not limited to  Anaphylaxis, hereditary angioedema, ACE inhibitor angioedema ____________________________________________   LABS (all labs ordered are listed, but only abnormal results are displayed)  Labs Reviewed - No data to display   __________________________________________  EKG   ____________________________________________  RADIOLOGY   ____________________________________________   PROCEDURES  Procedure(s) performed: no  Procedures  Critical Care performed: no  Observation:  no ____________________________________________   INITIAL IMPRESSION / ASSESSMENT AND PLAN / ED COURSE  Pertinent labs & imaging results that were available during my care of the patient were reviewed by me and considered in my medical decision making (see chart for details).  On arrival the patient has obvious angioedema.  He has no allergic symptoms.  I will give him intramuscular epinephrine just to see if it may work, however I anticipate 4 hours of observation.  The patient understands to call out should he start drooling, develops shortness of breath, or develop involvement in his tongue.     ----------------------------------------- 9:26 PM on 11/16/2017 -----------------------------------------    ----------------------------------------- 10:50 PM on 11/16/2017 -----------------------------------------  The patient has been observed for more than 4 hours with no progression of his symptoms.  He has no drooling and no shortness of breath.  He is able to eat and drink without difficulty. ____________________________________________   FINAL CLINICAL IMPRESSION(S) / ED DIAGNOSES  Final diagnoses:  Hereditary angioedema (HCC)      NEW  MEDICATIONS STARTED DURING THIS VISIT:  This SmartLink is deprecated. Use AVSMEDLIST instead to display the medication list for a patient.   Note:  This document was prepared using Dragon voice recognition software and may include unintentional dictation errors.     Merrily Brittle, MD 11/16/17 [phone]

## 2017-11-16 NOTE — Discharge Instructions (Signed)
There is no particular treatment or cure for the swelling of your face except to make sure that you can breathe.  Come immediately back to the emergency department if you are short of breath, if your voice changes, if you develop drooling, or if your tongue began swelling.

## 2018-07-06 ENCOUNTER — Emergency Department
Admission: EM | Admit: 2018-07-06 | Discharge: 2018-07-06 | Disposition: A | Discharge status: Home / Self Care | Payer: Medicare Other | Attending: Emergency Medicine | Admitting: Emergency Medicine

## 2018-07-06 ENCOUNTER — Encounter: Payer: Self-pay | Admitting: Emergency Medicine

## 2018-07-06 DIAGNOSIS — F17210 Nicotine dependence, cigarettes, uncomplicated: Secondary | ICD-10-CM | POA: Insufficient documentation

## 2018-07-06 DIAGNOSIS — E119 Type 2 diabetes mellitus without complications: Secondary | ICD-10-CM | POA: Insufficient documentation

## 2018-07-06 DIAGNOSIS — L732 Hidradenitis suppurativa: Secondary | ICD-10-CM | POA: Diagnosis not present

## 2018-07-06 DIAGNOSIS — I10 Essential (primary) hypertension: Secondary | ICD-10-CM | POA: Diagnosis not present

## 2018-07-06 DIAGNOSIS — Z79899 Other long term (current) drug therapy: Secondary | ICD-10-CM | POA: Diagnosis not present

## 2018-07-06 MED ORDER — DOXYCYCLINE HYCLATE 100 MG PO TABS
100.0000 mg | ORAL_TABLET | Freq: Once | ORAL | Status: AC
  Administered 2018-07-06: 100 mg via ORAL
  Filled 2018-07-06: qty 1

## 2018-07-06 MED ORDER — DOXYCYCLINE HYCLATE 100 MG PO TABS: 100.0000 mg | ORAL_TABLET | Freq: Two times a day (BID) | ORAL | 0 refills | Status: AC

## 2018-07-06 NOTE — Discharge Instructions (Signed)
Take the antibiotic as directed. Follow-up with Dr. Ellsworth Lennox for further management. Return to the Emergency Department as needed.

## 2018-07-06 NOTE — ED Triage Notes (Signed)
Pt arrived to ED via EMS from home with reports of draining abscess under both buttocks. Pt reports he had sweat glands removed from the area 4 years ago but they were unable to remove them all. Pt repots the current 2 areas have been present x1 year but have recently started to drain and have an "awful" odor. Pt sts, "I just moved in with my mom and she says for me to stay there I have to have them checked because they smell so bad."

## 2018-07-06 NOTE — ED Provider Notes (Signed)
[facility] Emergency Department Provider Note ____________________________________________  Time seen: 2149  I have reviewed the triage vital signs and the nursing notes.  HISTORY  Chief Complaint  Cellulitis and Abscess  HPI Walter Pena. is a 37 y.o. male Mod[place] Jansky himself to the ED for evaluation of some spontaneous purulent drainage from his gluteal cleft.  Patient gives a history of hidradenitis suppurativa, as well as previous excisional management of sweat glands around his gluteal cleft, about 4 years prior.  He reports that he has had some spontaneous purulent drainage for several days to weeks.  He is a patient of Dr. Darlina Rumpf, but denies any current evaluation and management.  He denies any recent fevers, chills, or sweats.  He presents to the ED for evaluation and management.  Past Medical History:  Diagnosis Date  . Gout   . Hidradenitis   . Hypertension   . Schizophrenia [facility])     Patient Active Problem List   Diagnosis Date Noted  . 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 Diabetes (HCC) 02/18/2017  . Schizophrenia (HCC) 02/17/2017  . Alcohol use disorder, moderate, dependence (HCC) 02/17/2017  . Noncompliance 05/07/2016  . HTN (hypertension) 01/21/2016  . Cannabis use disorder, moderate, dependence (HCC) 01/21/2016  . Tobacco use disorder 01/21/2016  . Gout 01/21/2016  . Hidradenitis 01/21/2016    Past Surgical History:  Procedure Laterality Date  . sweat gland removal      Prior to Admission medications   Medication Sig Start Date End Date Taking? Authorizing Provider  allopurinol (ZYLOPRIM) 100 MG tablet Take 100 mg by mouth daily. 01/21/17   [provider]  amantadine (SYMMETREL) 100 MG capsule Take 1 capsule (100 mg total) by mouth 2 (two) times daily. 10/27/17   McNew, Ileene Hutchinson, MD  amLODipine (NORVASC) 10 MG tablet Take 1 tablet (10 mg total) by mouth daily. 10/27/17   McNew, Ileene Hutchinson, MD  benazepril  (LOTENSIN) 40 MG tablet Take 1 tablet (40 mg total) by mouth daily. 10/27/17   McNew, Ileene Hutchinson, MD  benztropine (COGENTIN) 1 MG tablet Take 1 mg by mouth daily.    [provider]  colchicine 0.6 MG tablet 1 tablet by mouth daily until gout pain is gone. 04/24/16   Joni Reining, PA-C  doxycycline (VIBRA-TABS) 100 MG tablet Take 1 tablet (100 mg total) by mouth 2 (two) times daily. 07/06/18 08/05/18  Patriciann Becht, Charlesetta Ivory, PA-C  haloperidol (HALDOL) 5 MG tablet Take 1 tablet (5 mg total) by mouth at bedtime. 10/27/17   McNew, Ileene Hutchinson, MD  paliperidone (INVEGA SUSTENNA) 156 MG/ML SUSP injection Inject 156 mg into the muscle once.    [provider]  paliperidone (INVEGA SUSTENNA) 234 MG/1.5ML SUSP injection Inject 234 mg into the muscle once for 1 dose. Due May 7 Patient not taking: Reported on 11/16/2017 10/27/17 11/16/17  Haskell Riling, MD  traZODone (DESYREL) 150 MG tablet Take 1 tablet (150 mg total) by mouth at bedtime as needed for sleep. Patient not taking: Reported on 11/16/2017 10/27/17   Haskell Riling, MD    Allergies Bactrim [sulfamethoxazole-trimethoprim]; Lisinopril; and Other  Family History  Problem Relation Age of Onset  . Diabetes Mellitus II Mother   . Hypertension Mother     Social History Social History   Tobacco Use  . Smoking status: Current Every Day Smoker    Packs/day: 1.00    Years: 9.00    Pack years: 9.00    Types: Cigarettes  .  Smokeless tobacco: Never Used  Substance Use Topics  . Alcohol use: Yes  . Drug use: Yes    Types: Marijuana    Review of Systems  Constitutional: Negative for fever. Cardiovascular: Negative for chest pain. Respiratory: Negative for shortness of breath. Gastrointestinal: Negative for abdominal pain, vomiting and diarrhea. Genitourinary: Negative for dysuria. Musculoskeletal: Negative for back pain. Skin: Negative for rash. Drainage to the gluteal cleft.  Neurological: Negative for headaches, focal  weakness or numbness. ____________________________________________  PHYSICAL EXAM:  VITAL SIGNS: ED Triage Vitals  Enc Vitals Group     BP 07/06/18 2018 (!) 145/96     Pulse Rate 07/06/18 2018 90     Resp 07/06/18 2018 17     Temp 07/06/18 2018 98.2 F (36.8 C)     Temp Source 07/06/18 2018 Oral     SpO2 07/06/18 2018 99 %     Weight 07/06/18 2019 265 lb (120.2 kg)     Height --      Head Circumference --      Peak Flow --      Pain Score 07/06/18 2238 4     Pain Loc --      Pain Edu? --      Excl. in GC? --     Constitutional: Alert and oriented. Well appearing and in no distress. Head: Normocephalic and atraumatic. Cardiovascular: Normal rate, regular rhythm. Normal distal pulses. Respiratory: Normal respiratory effort. No wheezes/rales/rhonchi. Musculoskeletal: Nontender with normal range of motion in all extremities.  Neurologic:  Normal gait without ataxia. Normal speech and language. No gross focal neurologic deficits are appreciated. Skin:  Skin is warm, dry and intact. No rash noted.  Evaluation of the patient's gluteal cleft reveals previous surgical excision along the gluteal cleft.  There are some very small superficial tracts noted as well is some significant hypertrophic scarring.  There are 2 areas that show some focal skin edema which appear to be abraded and pink and slightly purulent at this time.  Some expression over the other scarred region elicits some scant amount of milky purulent drainage.  There are no focal pointing abscesses, fluctuance, or significant drainage appreciated. Psychiatric: Mood and affect are normal. Patient exhibits appropriate insight and judgment. ____________________________________________  PROCEDURES  Procedures Doxycycline 100 mg PO ____________________________________________  INITIAL IMPRESSION / ASSESSMENT AND PLAN / ED COURSE  ED evaluation of spontaneous purulent drainage from the gluteal cleft secondary to at  hidradenitis suppurativa.  There is no acute abscess formation warranting incision and drainage at this time.  We discussed the benefit of a long course of doxycycline for management.  I advised that I would do a 1 month course, and the patient would need to follow-up with the primary provider for potential continuation beyond that.  He verbalized understanding and will be discharged with a prescription for doxycycline x4 weeks.  Return precautions have been reviewed. ____________________________________________  FINAL CLINICAL IMPRESSION(S) / ED DIAGNOSES  Final diagnoses:  Hidradenitis suppurativa      Karmen Stabs, Charlesetta Ivory, PA-C 07/06/18 2329    Jeanmarie Plant, MD 07/06/18 [phone]

## 2018-07-06 NOTE — ED Notes (Signed)
Pt calling a ride at this time 

## 2018-09-18 ENCOUNTER — Other Ambulatory Visit: Payer: Self-pay

## 2018-09-18 ENCOUNTER — Encounter: Payer: Self-pay | Admitting: Emergency Medicine

## 2018-09-18 ENCOUNTER — Emergency Department
Admission: EM | Admit: 2018-09-18 | Discharge: 2018-09-18 | Disposition: A | Discharge status: Home / Self Care | Payer: Medicare Other | Attending: Emergency Medicine | Admitting: Emergency Medicine

## 2018-09-18 DIAGNOSIS — R2242 Localized swelling, mass and lump, left lower limb: Secondary | ICD-10-CM | POA: Diagnosis not present

## 2018-09-18 DIAGNOSIS — M109 Gout, unspecified: Secondary | ICD-10-CM | POA: Diagnosis not present

## 2018-09-18 DIAGNOSIS — E876 Hypokalemia: Secondary | ICD-10-CM | POA: Insufficient documentation

## 2018-09-18 DIAGNOSIS — F1210 Cannabis abuse, uncomplicated: Secondary | ICD-10-CM | POA: Diagnosis not present

## 2018-09-18 DIAGNOSIS — E119 Type 2 diabetes mellitus without complications: Secondary | ICD-10-CM | POA: Diagnosis not present

## 2018-09-18 DIAGNOSIS — M79672 Pain in left foot: Secondary | ICD-10-CM | POA: Diagnosis not present

## 2018-09-18 DIAGNOSIS — F17210 Nicotine dependence, cigarettes, uncomplicated: Secondary | ICD-10-CM | POA: Diagnosis not present

## 2018-09-18 DIAGNOSIS — Z79899 Other long term (current) drug therapy: Secondary | ICD-10-CM | POA: Diagnosis not present

## 2018-09-18 DIAGNOSIS — I10 Essential (primary) hypertension: Secondary | ICD-10-CM | POA: Diagnosis not present

## 2018-09-18 DIAGNOSIS — M79642 Pain in left hand: Secondary | ICD-10-CM | POA: Diagnosis present

## 2018-09-18 DIAGNOSIS — R2232 Localized swelling, mass and lump, left upper limb: Secondary | ICD-10-CM | POA: Diagnosis not present

## 2018-09-18 LAB — DIFFERENTIAL
BASOS ABS: 0 10*3/uL (ref 0.0–0.1)
BASOS PCT: 0 %
EOS ABS: 0.3 10*3/uL (ref 0.0–0.5)
Eosinophils Relative: 2 %
Lymphocytes Relative: 23 %
Lymphs Abs: 2.8 10*3/uL (ref 0.7–4.0)
MONO ABS: 0.7 10*3/uL (ref 0.1–1.0)
Monocytes Relative: 6 %
NEUTROS ABS: 8.5 10*3/uL — AB (ref 1.7–7.7)
Neutrophils Relative %: 69 %

## 2018-09-18 LAB — BASIC METABOLIC PANEL
ANION GAP: 8 (ref 5–15)
BUN: 10 mg/dL (ref 6–20)
CALCIUM: 8.7 mg/dL — AB (ref 8.9–10.3)
CO2: 26 mmol/L (ref 22–32)
Chloride: 104 mmol/L (ref 98–111)
Creatinine, Ser: 1.01 mg/dL (ref 0.61–1.24)
GFR calc Af Amer: >60 mL/min (ref >60)
Glucose, Bld: 178 mg/dL — ABNORMAL HIGH (ref 70–99)
POTASSIUM: 3.4 mmol/L — AB (ref 3.5–5.1)
SODIUM: 138 mmol/L (ref 135–145)

## 2018-09-18 LAB — CBC
HCT: 33.6 % — ABNORMAL LOW (ref 39.0–52.0)
Hemoglobin: 10.8 g/dL — ABNORMAL LOW (ref 13.0–17.0)
MCH: 27.4 pg (ref 26.0–34.0)
MCHC: 32.1 g/dL (ref 30.0–36.0)
MCV: 85.3 fL (ref 80.0–100.0)
NRBC: 0 % (ref 0.0–0.2)
PLATELETS: 373 10*3/uL (ref 150–400)
RBC: 3.94 MIL/uL — AB (ref 4.22–5.81)
RDW: 14 % (ref 11.5–15.5)
WBC: 12.4 10*3/uL — ABNORMAL HIGH (ref 4.0–10.5)

## 2018-09-18 LAB — URIC ACID: URIC ACID, SERUM: 6.6 mg/dL (ref 3.7–8.6)

## 2018-09-18 MED ORDER — NAPROXEN 375 MG PO TABS: 375.0000 mg | ORAL_TABLET | Freq: Two times a day (BID) | ORAL | 0 refills | Status: DC

## 2018-09-18 MED ORDER — METHYLPREDNISOLONE SODIUM SUCC 125 MG IJ SOLR
125.0000 mg | Freq: Once | INTRAMUSCULAR | Status: AC
  Administered 2018-09-18: 125 mg via INTRAMUSCULAR
  Filled 2018-09-18: qty 2

## 2018-09-18 MED ORDER — AMLODIPINE BESYLATE 10 MG PO TABS: 10.0000 mg | ORAL_TABLET | Freq: Every day | ORAL | 0 refills | Status: DC

## 2018-09-18 MED ORDER — POTASSIUM CHLORIDE ER 10 MEQ PO TBCR: 10.0000 meq | EXTENDED_RELEASE_TABLET | Freq: Two times a day (BID) | ORAL | 0 refills | Status: DC

## 2018-09-18 MED ORDER — POTASSIUM CHLORIDE CRYS ER 20 MEQ PO TBCR
40.0000 meq | EXTENDED_RELEASE_TABLET | Freq: Once | ORAL | Status: AC
  Administered 2018-09-18: 40 meq via ORAL
  Filled 2018-09-18: qty 2

## 2018-09-18 MED ORDER — NAPROXEN 500 MG PO TABS
500.0000 mg | ORAL_TABLET | Freq: Once | ORAL | Status: AC
  Administered 2018-09-18: 500 mg via ORAL
  Filled 2018-09-18: qty 1

## 2018-09-18 MED ORDER — PREDNISONE 10 MG PO TABS: ORAL_TABLET | ORAL | 0 refills | Status: DC

## 2018-09-18 NOTE — ED Notes (Signed)
See triage note   Presents with pain and swelling to left hand and foot  States sx's started about 1 week ago   Hx of gout and states his meds are not working

## 2018-09-18 NOTE — ED Provider Notes (Signed)
[facility] Emergency Department Provider Note  ____________________________________________  Time seen: Approximately 8:41 AM  I have reviewed the triage vital signs and the nursing notes.   HISTORY  Chief Complaint Gout    HPI Walter Pena. is a 37 y.o. male that presents emergency department for evaluation of left hand and left foot pain and swelling for 2 weeks.  Patient states that hand is primarily painful in his left middle and little finger.  His foot is primarily painful over the top.  He has had pain with walking due to pain.  He is his primary care provider 2 weeks ago and was given a prescription for colchicine.  He usually takes prednisone, which resolved symptoms.  Colchicine has not been helping.  This is where he has had gout in the past.    No IV drug use.  He did not take his blood pressure medication this morning and has run out of his amlodipine.  No fever, chills.  Past Medical History:  Diagnosis Date  . Gout   . Hidradenitis   . Hypertension   . Schizophrenia [facility])     Patient Active Problem List   Diagnosis Date Noted  . 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 Diabetes (HCC) 02/18/2017  . Schizophrenia (HCC) 02/17/2017  . Alcohol use disorder, moderate, dependence (HCC) 02/17/2017  . Noncompliance 05/07/2016  . HTN (hypertension) 01/21/2016  . Cannabis use disorder, moderate, dependence (HCC) 01/21/2016  . Tobacco use disorder 01/21/2016  . Gout 01/21/2016  . Hidradenitis 01/21/2016    Past Surgical History:  Procedure Laterality Date  . sweat gland removal      Prior to Admission medications   Medication Sig Start Date End Date Taking? Authorizing Provider  allopurinol (ZYLOPRIM) 100 MG tablet Take 100 mg by mouth daily. 01/21/17   [provider]  amantadine (SYMMETREL) 100 MG capsule Take 1 capsule (100 mg total) by mouth 2 (two) times daily. 10/27/17   McNew, Ileene Hutchinson, MD  amLODipine  (NORVASC) 10 MG tablet Take 1 tablet (10 mg total) by mouth daily. 09/18/18   Enid Derry, PA-C  benazepril (LOTENSIN) 40 MG tablet Take 1 tablet (40 mg total) by mouth daily. 10/27/17   McNew, Ileene Hutchinson, MD  benztropine (COGENTIN) 1 MG tablet Take 1 mg by mouth daily.    [provider]  colchicine 0.6 MG tablet 1 tablet by mouth daily until gout pain is gone. 04/24/16   Joni Reining, PA-C  haloperidol (HALDOL) 5 MG tablet Take 1 tablet (5 mg total) by mouth at bedtime. 10/27/17   McNew, Ileene Hutchinson, MD  naproxen (NAPROSYN) 375 MG tablet Take 1 tablet (375 mg total) by mouth 2 (two) times daily with a meal. 09/18/18   Enid Derry, PA-C  paliperidone (INVEGA SUSTENNA) 156 MG/ML SUSP injection Inject 156 mg into the muscle once.    [provider]  paliperidone (INVEGA SUSTENNA) 234 MG/1.5ML SUSP injection Inject 234 mg into the muscle once for 1 dose. Due May 7 Patient not taking: Reported on 11/16/2017 10/27/17 11/16/17  Haskell Riling, MD  potassium chloride (K-DUR) 10 MEQ tablet Take 1 tablet (10 mEq total) by mouth 2 (two) times daily. 09/18/18   Enid Derry, PA-C  predniSONE (DELTASONE) 10 MG tablet Take 6 tablets day 1, take 5 tablets day 2, take 4 tablets day 3, take 3 tablets day 4, take 2 tablets day 5, take 1 tablet day 6 09/18/18   Enid Derry, PA-C  traZODone (  DESYREL) 150 MG tablet Take 1 tablet (150 mg total) by mouth at bedtime as needed for sleep. Patient not taking: Reported on 11/16/2017 10/27/17   Haskell Riling, MD    Allergies Bactrim [sulfamethoxazole-trimethoprim]; Lisinopril; and Other  Family History  Problem Relation Age of Onset  . Diabetes Mellitus II Mother   . Hypertension Mother     Social History Social History   Tobacco Use  . Smoking status: Current Every Day Smoker    Packs/day: 1.00    Years: 9.00    Pack years: 9.00    Types: Cigarettes  . Smokeless tobacco: Never Used  Substance Use Topics  . Alcohol use: Yes  . Drug use:  Yes    Types: Marijuana     Review of Systems  Constitutional: No fever/chills Respiratory:  No SOB. Gastrointestinal: No abdominal pain.  No nausea, no vomiting.  Musculoskeletal: Positive for hand and foot pain.  Skin: Negative for rash, abrasions, lacerations, ecchymosis.   ____________________________________________   PHYSICAL EXAM:  VITAL SIGNS: ED Triage Vitals  Enc Vitals Group     BP 09/18/18 0813 (!) 184/110     Pulse Rate 09/18/18 0813 85     Resp 09/18/18 0813 18     Temp 09/18/18 0813 97.9 F (36.6 C)     Temp Source 09/18/18 0813 Oral     SpO2 09/18/18 0813 97 %     Weight 09/18/18 0810 264 lb 15.9 oz (120.2 kg)     Height --      Head Circumference --      Peak Flow --      Pain Score 09/18/18 0809 10     Pain Loc --      Pain Edu? --      Excl. in GC? --      Constitutional: Alert and oriented. Well appearing and in no acute distress. Eyes: Conjunctivae are normal. PERRL. EOMI. Head: Atraumatic. ENT:      Ears:      Nose: No congestion/rhinnorhea.      Mouth/Throat: Mucous membranes are moist.  Neck: No stridor.   Cardiovascular: Normal rate, regular rhythm.  Good peripheral circulation. Respiratory: Normal respiratory effort without tachypnea or retractions. Lungs CTAB. Good air entry to the bases with no decreased or absent breath sounds. Musculoskeletal: Full range of motion to all extremities. No gross deformities appreciated.  Swelling and tenderness to palpation to left middle and little finger.  Tenderness to palpation over dorsal hand.  No erythema.  Extreme tenderness to palpation over dorsal left foot.  Mild swelling to left foot.  No erythema. Neurologic:  Normal speech and language. No gross focal neurologic deficits are appreciated.  Skin:  Skin is warm, dry and intact. No rash noted. Psychiatric: Mood and affect are normal. Speech and behavior are normal. Patient exhibits appropriate insight and  judgement.   ____________________________________________   LABS (all labs ordered are listed, but only abnormal results are displayed)  Labs Reviewed  CBC - Abnormal; Notable for the following components:      Result Value   WBC 12.4 (*)    RBC 3.94 (*)    Hemoglobin 10.8 (*)    HCT 33.6 (*)    All other components within normal limits  BASIC METABOLIC PANEL - Abnormal; Notable for the following components:   Potassium 3.4 (*)    Glucose, Bld 178 (*)    Calcium 8.7 (*)    All other components within normal limits  DIFFERENTIAL - Abnormal;  Notable for the following components:   Neutro Abs 8.5 (*)    All other components within normal limits  URIC ACID   ____________________________________________  EKG   ____________________________________________  RADIOLOGY   No results found.  ____________________________________________    PROCEDURES  Procedure(s) performed:    Procedures    Medications  methylPREDNISolone sodium succinate (SOLU-MEDROL) 125 mg/2 mL injection 125 mg (125 mg Intramuscular Given 09/18/18 1053)  naproxen (NAPROSYN) tablet 500 mg (500 mg Oral Given 09/18/18 1053)  potassium chloride SA (K-DUR,KLOR-CON) CR tablet 40 mEq (40 mEq Oral Given 09/18/18 1053)     ____________________________________________   INITIAL IMPRESSION / ASSESSMENT AND PLAN / ED COURSE  Pertinent labs & imaging results that were available during my care of the patient were reviewed by me and considered in my medical decision making (see chart for details).  Review of the [place] CSRS was performed in accordance of the [facility] prior to dispensing any controlled drugs.     Patient's diagnosis is consistent with gout.  Vital signs and exam are reassuring.  Exam is not consistent with a bacterial infection.  White blood cells mildly elevated at 12.7, likely due to inflammation.  Uric acid within normal range, which is expected as patient has been taking colchicine for the last  week.  IM Solu-Medrol and Toradol were given.  Patient has been out of 1 of his 2 blood pressure medications.  He did not take his blood pressure medication this morning.  His amlodipine will be refilled.  Potassium was slightly low at 3.4.  Potassium was supplemented.  This is likely due to his blood pressure medication.  Patient will be discharged home with prescriptions for hypertension, hypokalemia, gout. Patient is to follow up with prednisone, amlodipine, potassium, naproxen as directed. Patient is given ED precautions to return to the ED for any worsening or new symptoms.     ____________________________________________  FINAL CLINICAL IMPRESSION(S) / ED DIAGNOSES  Final diagnoses:  Hypertension, unspecified type  Hypokalemia  Acute gout of multiple sites, unspecified cause      NEW MEDICATIONS STARTED DURING THIS VISIT:  ED Discharge Orders         Ordered    predniSONE (DELTASONE) 10 MG tablet  Status:  Discontinued     09/18/18 1041    amLODipine (NORVASC) 10 MG tablet  Daily,   Status:  Discontinued     09/18/18 1041    potassium chloride (K-DUR) 10 MEQ tablet  2 times daily,   Status:  Discontinued     09/18/18 1041    naproxen (NAPROSYN) 375 MG tablet  2 times daily with meals     09/18/18 1041    amLODipine (NORVASC) 10 MG tablet  Daily     09/18/18 1042    potassium chloride (K-DUR) 10 MEQ tablet  2 times daily     09/18/18 1042    predniSONE (DELTASONE) 10 MG tablet     09/18/18 1042              This chart was dictated using voice recognition software/Dragon. Despite best efforts to proofread, errors can occur which can change the meaning. Any change was purely unintentional.    Enid Derry, PA-C 09/18/18 1521    Governor Rooks, MD 09/18/18 1606

## 2018-09-18 NOTE — ED Triage Notes (Signed)
C/O gout flare up to feet and hands x 1 week.

## 2018-10-11 ENCOUNTER — Emergency Department
Admission: EM | Admit: 2018-10-11 | Discharge: 2018-10-11 | Disposition: A | Discharge status: Home / Self Care | Payer: Medicare Other | Attending: Emergency Medicine | Admitting: Emergency Medicine

## 2018-10-11 ENCOUNTER — Encounter: Payer: Self-pay | Admitting: Emergency Medicine

## 2018-10-11 DIAGNOSIS — M10042 Idiopathic gout, left hand: Secondary | ICD-10-CM | POA: Diagnosis not present

## 2018-10-11 DIAGNOSIS — Z79899 Other long term (current) drug therapy: Secondary | ICD-10-CM | POA: Diagnosis not present

## 2018-10-11 DIAGNOSIS — F17210 Nicotine dependence, cigarettes, uncomplicated: Secondary | ICD-10-CM | POA: Insufficient documentation

## 2018-10-11 DIAGNOSIS — M79672 Pain in left foot: Secondary | ICD-10-CM | POA: Diagnosis present

## 2018-10-11 DIAGNOSIS — M10072 Idiopathic gout, left ankle and foot: Secondary | ICD-10-CM | POA: Insufficient documentation

## 2018-10-11 DIAGNOSIS — E119 Type 2 diabetes mellitus without complications: Secondary | ICD-10-CM | POA: Insufficient documentation

## 2018-10-11 DIAGNOSIS — M109 Gout, unspecified: Secondary | ICD-10-CM

## 2018-10-11 DIAGNOSIS — I10 Essential (primary) hypertension: Secondary | ICD-10-CM | POA: Insufficient documentation

## 2018-10-11 MED ORDER — PREDNISONE 20 MG PO TABS
60.0000 mg | ORAL_TABLET | Freq: Once | ORAL | Status: AC
  Administered 2018-10-11: 60 mg via ORAL
  Filled 2018-10-11: qty 3

## 2018-10-11 MED ORDER — HYDROCODONE-ACETAMINOPHEN 5-325 MG PO TABS
1.0000 | ORAL_TABLET | ORAL | Status: AC
  Administered 2018-10-11: 1 via ORAL
  Filled 2018-10-11: qty 1

## 2018-10-11 MED ORDER — HYDROCODONE-ACETAMINOPHEN 5-325 MG PO TABS: 1.0000 | ORAL_TABLET | Freq: Four times a day (QID) | ORAL | 0 refills | Status: DC | PRN

## 2018-10-11 MED ORDER — PREDNISONE 10 MG PO TABS: 10.0000 mg | ORAL_TABLET | Freq: Every day | ORAL | 0 refills | Status: DC

## 2018-10-11 NOTE — ED Triage Notes (Signed)
C/O  Left foot and hand gout pain x 3-4 days.

## 2018-10-11 NOTE — ED Provider Notes (Signed)
[facility] EMERGENCY DEPARTMENT Provider Note   CSN: [id number] Arrival date & time: 10/11/18  1849     History   Chief Complaint Chief Complaint  Patient presents with  . Gout    HPI Walter Pena. is a 37 y.o. male.  Presents to the emergency department for evaluation of left foot and left hand pain x3 to 4 days.  He has a history of gout.  States this is a gout flareup.  He is on colchicine.  Last flareup was nearly 3 weeks ago in the same left foot and left hand.  He complains of warmth, redness and pain with no fevers.  He denies any calf pain or swelling.  Pain is along the top of the left foot along the fourth and fifth digits of left hand.  Patient treated with a 6-day steroid taper a little over 3 weeks ago which nearly resolved his symptoms for nearly 3 weeks.  Over the last couple days his symptoms have returned.  He has not followed up with his PCP.  HPI  Past Medical History:  Diagnosis Date  . Gout   . Hidradenitis   . Hypertension   . Schizophrenia [facility])     Patient Active Problem List   Diagnosis Date Noted  . 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 Diabetes (HCC) 02/18/2017  . Schizophrenia (HCC) 02/17/2017  . Alcohol use disorder, moderate, dependence (HCC) 02/17/2017  . Noncompliance 05/07/2016  . HTN (hypertension) 01/21/2016  . Cannabis use disorder, moderate, dependence (HCC) 01/21/2016  . Tobacco use disorder 01/21/2016  . Gout 01/21/2016  . Hidradenitis 01/21/2016    Past Surgical History:  Procedure Laterality Date  . sweat gland removal          Home Medications    Prior to Admission medications   Medication Sig Start Date End Date Taking? Authorizing Provider  allopurinol (ZYLOPRIM) 100 MG tablet Take 100 mg by mouth daily. 01/21/17   [provider]  amantadine (SYMMETREL) 100 MG capsule Take 1 capsule (100 mg total) by mouth 2 (two) times daily. 10/27/17   McNew, Ileene Hutchinson, MD    amLODipine (NORVASC) 10 MG tablet Take 1 tablet (10 mg total) by mouth daily. 09/18/18   Enid Derry, PA-C  benazepril (LOTENSIN) 40 MG tablet Take 1 tablet (40 mg total) by mouth daily. 10/27/17   McNew, Ileene Hutchinson, MD  benztropine (COGENTIN) 1 MG tablet Take 1 mg by mouth daily.    [provider]  colchicine 0.6 MG tablet 1 tablet by mouth daily until gout pain is gone. 04/24/16   Joni Reining, PA-C  haloperidol (HALDOL) 5 MG tablet Take 1 tablet (5 mg total) by mouth at bedtime. 10/27/17   McNew, Ileene Hutchinson, MD  HYDROcodone-acetaminophen (NORCO) 5-325 MG tablet Take 1 tablet by mouth every 6 (six) hours as needed for moderate pain. 10/11/18   Evon Slack, PA-C  naproxen (NAPROSYN) 375 MG tablet Take 1 tablet (375 mg total) by mouth 2 (two) times daily with a meal. 09/18/18   Enid Derry, PA-C  paliperidone (INVEGA SUSTENNA) 156 MG/ML SUSP injection Inject 156 mg into the muscle once.    [provider]  paliperidone (INVEGA SUSTENNA) 234 MG/1.5ML SUSP injection Inject 234 mg into the muscle once for 1 dose. Due May 7 Patient not taking: Reported on 11/16/2017 10/27/17 11/16/17  Haskell Riling, MD  potassium chloride (K-DUR) 10 MEQ tablet Take 1 tablet (10 mEq total) by mouth 2 (two)  times daily. 09/18/18   Enid Derry, PA-C  predniSONE (DELTASONE) 10 MG tablet Take 1 tablet (10 mg total) by mouth daily. 6,5,4,3,2,1 six day taper 10/11/18   Evon Slack, PA-C  traZODone (DESYREL) 150 MG tablet Take 1 tablet (150 mg total) by mouth at bedtime as needed for sleep. Patient not taking: Reported on 11/16/2017 10/27/17   Haskell Riling, MD    Family History Family History  Problem Relation Age of Onset  . Diabetes Mellitus II Mother   . Hypertension Mother     Social History Social History   Tobacco Use  . Smoking status: Current Every Day Smoker    Packs/day: 1.00    Years: 9.00    Pack years: 9.00    Types: Cigarettes  . Smokeless tobacco: Never Used   Substance Use Topics  . Alcohol use: Yes  . Drug use: Yes    Types: Marijuana     Allergies   Bactrim [sulfamethoxazole-trimethoprim]; Lisinopril; and Other   Review of Systems Review of Systems  Constitutional: Negative for chills and fever.  Respiratory: Negative for shortness of breath.   Cardiovascular: Negative for chest pain.  Musculoskeletal: Positive for arthralgias, gait problem, joint swelling and myalgias. Negative for neck pain and neck stiffness.  Skin: Negative for color change, rash and wound.  Neurological: Negative for dizziness and headaches.     Physical Exam Updated Vital Signs Wt 120.2 kg   BMI 39.13 kg/m   Physical Exam  Constitutional: He is oriented to person, place, and time. He appears well-developed and well-nourished.  HENT:  Head: Normocephalic and atraumatic.  Eyes: Conjunctivae are normal.  Neck: Normal range of motion.  Cardiovascular: Normal rate.  Pulmonary/Chest: Effort normal. No respiratory distress.  Musculoskeletal: Normal range of motion.  Dorsum of the left foot is erythematous and warm with no edema.  2+ dorsalis pedis pulse.  No skin breakdown noted.  No ulcerations noted.  There is no swelling or edema throughout the calf.  Is nontender palpation throughout the calf.  Examination of left hand shows full composite fist but mild swelling along the fourth and fifth MCP joint.  Mild warmth and redness to the left fourth and fifth MCP joints.  Neurological: He is alert and oriented to person, place, and time.  Skin: Skin is warm. No rash noted.  Psychiatric: He has a normal mood and affect. His behavior is normal. Thought content normal.     ED Treatments / Results  Labs (all labs ordered are listed, but only abnormal results are displayed) Labs Reviewed - No data to display  EKG None  Radiology No results found.  Procedures Procedures (including critical care time)  Medications Ordered in ED Medications  predniSONE  (DELTASONE) tablet 60 mg (has no administration in time range)  HYDROcodone-acetaminophen (NORCO/VICODIN) 5-325 MG per tablet 1 tablet (has no administration in time range)     Initial Impression / Assessment and Plan / ED Course  I have reviewed the triage vital signs and the nursing notes.  Pertinent labs & imaging results that were available during my care of the patient were reviewed by me and considered in my medical decision making (see chart for details).     37 year old male with a history of gout.  He is treated with prednisone and Norco for acute flareup today.  He will follow-up with PCP.  He understands signs symptoms to return to the ED for.  Final Clinical Impressions(s) / ED Diagnoses   Final diagnoses:  Acute gout of left foot, unspecified cause  Acute gout of left hand, unspecified cause    ED Discharge Orders         Ordered    predniSONE (DELTASONE) 10 MG tablet  Daily     10/11/18 1959    HYDROcodone-acetaminophen (NORCO) 5-325 MG tablet  Every 6 hours PRN,   Status:  Discontinued     10/11/18 1959    HYDROcodone-acetaminophen (NORCO) 5-325 MG tablet  Every 6 hours PRN     10/11/18 2000           Evon Slack, PA-C 10/11/18 2003    Myrna Blazer, MD 10/11/18 2332

## 2018-10-11 NOTE — Discharge Instructions (Signed)
Please take medications as prescribed.  Call PCP tomorrow morning to schedule follow-up appointment.  Return to ER for any worsening symptoms or urgent changes in health.

## 2018-11-08 ENCOUNTER — Emergency Department
Admission: EM | Admit: 2018-11-08 | Discharge: 2018-11-08 | Disposition: A | Discharge status: Home / Self Care | Payer: Medicare Other | Attending: Emergency Medicine | Admitting: Emergency Medicine

## 2018-11-08 ENCOUNTER — Other Ambulatory Visit: Payer: Self-pay

## 2018-11-08 ENCOUNTER — Emergency Department: Payer: Medicare Other

## 2018-11-08 DIAGNOSIS — Z79899 Other long term (current) drug therapy: Secondary | ICD-10-CM | POA: Insufficient documentation

## 2018-11-08 DIAGNOSIS — I10 Essential (primary) hypertension: Secondary | ICD-10-CM | POA: Insufficient documentation

## 2018-11-08 DIAGNOSIS — B372 Candidiasis of skin and nail: Secondary | ICD-10-CM | POA: Diagnosis not present

## 2018-11-08 DIAGNOSIS — R300 Dysuria: Secondary | ICD-10-CM | POA: Insufficient documentation

## 2018-11-08 DIAGNOSIS — F17210 Nicotine dependence, cigarettes, uncomplicated: Secondary | ICD-10-CM | POA: Insufficient documentation

## 2018-11-08 DIAGNOSIS — N3090 Cystitis, unspecified without hematuria: Secondary | ICD-10-CM | POA: Diagnosis not present

## 2018-11-08 DIAGNOSIS — F1210 Cannabis abuse, uncomplicated: Secondary | ICD-10-CM | POA: Diagnosis not present

## 2018-11-08 DIAGNOSIS — R109 Unspecified abdominal pain: Secondary | ICD-10-CM | POA: Diagnosis present

## 2018-11-08 LAB — URINALYSIS, COMPLETE (UACMP) WITH MICROSCOPIC
BILIRUBIN URINE: NEGATIVE
Bacteria, UA: NONE SEEN
GLUCOSE, UA: NEGATIVE mg/dL
Hgb urine dipstick: NEGATIVE
Ketones, ur: NEGATIVE mg/dL
Nitrite: NEGATIVE
Protein, ur: 30 mg/dL — AB
Specific Gravity, Urine: 1.025 (ref 1.005–1.030)
WBC, UA: >50 WBC/hpf — ABNORMAL HIGH (ref 0–5)
pH: 5 (ref 5.0–8.0)

## 2018-11-08 LAB — BASIC METABOLIC PANEL
Anion gap: 8 (ref 5–15)
BUN: 8 mg/dL (ref 6–20)
CO2: 27 mmol/L (ref 22–32)
CREATININE: 1.04 mg/dL (ref 0.61–1.24)
Calcium: 8.6 mg/dL — ABNORMAL LOW (ref 8.9–10.3)
Chloride: 101 mmol/L (ref 98–111)
GFR calc Af Amer: >60 mL/min (ref >60)
GFR calc non Af Amer: >60 mL/min (ref >60)
Glucose, Bld: 198 mg/dL — ABNORMAL HIGH (ref 70–99)
Potassium: 3.3 mmol/L — ABNORMAL LOW (ref 3.5–5.1)
Sodium: 136 mmol/L (ref 135–145)

## 2018-11-08 LAB — CBC
HCT: 37.3 % — ABNORMAL LOW (ref 39.0–52.0)
Hemoglobin: 12.1 g/dL — ABNORMAL LOW (ref 13.0–17.0)
MCH: 27.3 pg (ref 26.0–34.0)
MCHC: 32.4 g/dL (ref 30.0–36.0)
MCV: 84 fL (ref 80.0–100.0)
Platelets: 392 10*3/uL (ref 150–400)
RBC: 4.44 MIL/uL (ref 4.22–5.81)
RDW: 15.2 % (ref 11.5–15.5)
WBC: 11.1 10*3/uL — ABNORMAL HIGH (ref 4.0–10.5)
nRBC: 0 % (ref 0.0–0.2)

## 2018-11-08 MED ORDER — CEPHALEXIN 500 MG PO CAPS: 500.0000 mg | ORAL_CAPSULE | Freq: Two times a day (BID) | ORAL | 0 refills | Status: DC

## 2018-11-08 MED ORDER — NYSTATIN 100000 UNIT/GM EX POWD: Freq: Four times a day (QID) | CUTANEOUS | 0 refills | Status: DC

## 2018-11-08 MED ORDER — KETOROLAC TROMETHAMINE 60 MG/2ML IM SOLN
15.0000 mg | Freq: Once | INTRAMUSCULAR | Status: AC
  Administered 2018-11-08: 15 mg via INTRAMUSCULAR
  Filled 2018-11-08: qty 2

## 2018-11-08 MED ORDER — CEFTRIAXONE SODIUM 1 G IJ SOLR
500.0000 mg | Freq: Once | INTRAMUSCULAR | Status: AC
  Administered 2018-11-08: 500 mg via INTRAMUSCULAR
  Filled 2018-11-08: qty 10

## 2018-11-08 MED ORDER — METRONIDAZOLE 500 MG PO TABS
2000.0000 mg | ORAL_TABLET | Freq: Once | ORAL | Status: AC
  Administered 2018-11-08: 2000 mg via ORAL
  Filled 2018-11-08: qty 4

## 2018-11-08 MED ORDER — AZITHROMYCIN 500 MG PO TABS
1000.0000 mg | ORAL_TABLET | Freq: Once | ORAL | Status: AC
  Administered 2018-11-08: 1000 mg via ORAL
  Filled 2018-11-08: qty 2

## 2018-11-08 MED ORDER — LIDOCAINE HCL (PF) 1 % IJ SOLN
5.0000 mL | Freq: Once | INTRAMUSCULAR | Status: AC
  Administered 2018-11-08: 5 mL
  Filled 2018-11-08: qty 5

## 2018-11-08 NOTE — ED Notes (Signed)
Pt states ever since he drank 2 sodas 3 days ago his stomach has been hurting. Denies n/v and diarrhea.

## 2018-11-08 NOTE — ED Triage Notes (Signed)
Pt arrives via [facility] from home with reports of left sided flank pain for the last three days  Pt states  12/10 pain

## 2018-11-08 NOTE — ED Notes (Signed)
Patient transported to CT 

## 2018-11-08 NOTE — ED Notes (Signed)
NAD noted at time of D/C. Pt denies questions or concerns. Pt ambulatory to the lobby at this time.  

## 2018-11-08 NOTE — Discharge Instructions (Addendum)
Use antibiotics as prescribed to treat your skin infection of the groin and urinary tract infection.  We also treated you for possible sexually transmitted infections today.  Avoid sexual activity for a week, and advise any sexual partners to seek treatment as well.

## 2018-11-08 NOTE — ED Provider Notes (Signed)
[facility] Emergency Department Provider Note  ____________________________________________  Time seen: Approximately 3:18 PM  I have reviewed the triage vital signs and the nursing notes.   HISTORY  Chief Complaint Flank Pain    HPI Arma HeadingCharles Lewis Hartney Jr. is a 37 y.o. male with a history of hidradenitis, hypertension, schizophrenia who complains of left flank pain for the past 3 days, intermittent, no aggravating or alleviating factors, nonradiating, described as severe and sharp.  Patient also reports dysuria and a foul odor in his groin.   Patient also reports that he has had 3 sexual partners in the past month, not protected   Past Medical History:  Diagnosis Date  . Gout   . Hidradenitis   . Hypertension   . Schizophrenia [facility](HCC)      Patient Active Problem List   Diagnosis Date Noted  . 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 Diabetes (HCC) 02/18/2017  . Schizophrenia (HCC) 02/17/2017  . Alcohol use disorder, moderate, dependence (HCC) 02/17/2017  . Noncompliance 05/07/2016  . HTN (hypertension) 01/21/2016  . Cannabis use disorder, moderate, dependence (HCC) 01/21/2016  . Tobacco use disorder 01/21/2016  . Gout 01/21/2016  . Hidradenitis 01/21/2016     Past Surgical History:  Procedure Laterality Date  . sweat gland removal       Prior to Admission medications   Medication Sig Start Date End Date Taking? Authorizing Provider  allopurinol (ZYLOPRIM) 100 MG tablet Take 100 mg by mouth daily. 01/21/17   [provider]  amantadine (SYMMETREL) 100 MG capsule Take 1 capsule (100 mg total) by mouth 2 (two) times daily. 10/27/17   McNew, Ileene HutchinsonHolly R, MD  amLODipine (NORVASC) 10 MG tablet Take 1 tablet (10 mg total) by mouth daily. 09/18/18   Enid DerryWagner, Ashley, PA-C  benazepril (LOTENSIN) 40 MG tablet Take 1 tablet (40 mg total) by mouth daily. 10/27/17   McNew, Ileene HutchinsonHolly R, MD  benztropine (COGENTIN) 1 MG tablet Take 1 mg by  mouth daily.    [provider]  cephALEXin (KEFLEX) 500 MG capsule Take 1 capsule (500 mg total) by mouth 2 (two) times daily. 11/08/18   Sharman CheekStafford, Grizel Vesely, MD  colchicine 0.6 MG tablet 1 tablet by mouth daily until gout pain is gone. 04/24/16   Joni ReiningSmith, Ronald K, PA-C  haloperidol (HALDOL) 5 MG tablet Take 1 tablet (5 mg total) by mouth at bedtime. 10/27/17   McNew, Ileene HutchinsonHolly R, MD  HYDROcodone-acetaminophen (NORCO) 5-325 MG tablet Take 1 tablet by mouth every 6 (six) hours as needed for moderate pain. 10/11/18   Evon SlackGaines, Thomas C, PA-C  naproxen (NAPROSYN) 375 MG tablet Take 1 tablet (375 mg total) by mouth 2 (two) times daily with a meal. 09/18/18   Enid DerryWagner, Ashley, PA-C  nystatin (MYCOSTATIN/NYSTOP) powder Apply topically 4 (four) times daily. Apply to genitals and skin creases of the groin. 11/08/18   Sharman CheekStafford, Brinda Focht, MD  paliperidone (INVEGA SUSTENNA) 156 MG/ML SUSP injection Inject 156 mg into the muscle once.    [provider]  paliperidone (INVEGA SUSTENNA) 234 MG/1.5ML SUSP injection Inject 234 mg into the muscle once for 1 dose. Due May 7 Patient not taking: Reported on 11/16/2017 10/27/17 11/16/17  Haskell RilingMcNew, Holly R, MD  potassium chloride (K-DUR) 10 MEQ tablet Take 1 tablet (10 mEq total) by mouth 2 (two) times daily. 09/18/18   Enid DerryWagner, Ashley, PA-C  predniSONE (DELTASONE) 10 MG tablet Take 1 tablet (10 mg total) by mouth daily. 6,5,4,3,2,1 six day taper 10/11/18   Evon SlackGaines, Thomas C, PA-C  traZODone (DESYREL) 150 MG tablet Take 1 tablet (150 mg total) by mouth at bedtime as needed for sleep. Patient not taking: Reported on 11/16/2017 10/27/17   Haskell Riling, MD     Allergies Bactrim [sulfamethoxazole-trimethoprim]; Lisinopril; and Other   Family History  Problem Relation Age of Onset  . Diabetes Mellitus II Mother   . Hypertension Mother     Social History Social History   Tobacco Use  . Smoking status: Current Every Day Smoker    Packs/day: 1.00    Years: 9.00     Pack years: 9.00    Types: Cigarettes  . Smokeless tobacco: Never Used  Substance Use Topics  . Alcohol use: Yes  . Drug use: Yes    Types: Marijuana    Review of Systems  Constitutional:   No fever or chills.  ENT:   No sore throat. No rhinorrhea. Cardiovascular:   No chest pain or syncope. Respiratory:   No dyspnea or cough. Gastrointestinal:   Left leg pain as above without vomiting and diarrhea.  Musculoskeletal:   Negative for focal pain or swelling All other systems reviewed and are negative except as documented above in ROS and HPI.  ____________________________________________   PHYSICAL EXAM:  VITAL SIGNS: ED Triage Vitals [11/08/18 1045]  Enc Vitals Group     BP (!) 144/96     Pulse Rate 92     Resp 17     Temp 99 F (37.2 C)     Temp Source Oral     SpO2 99 %     Weight 245 lb (111.1 kg)     Height 5\' 9"  (1.753 m)     Head Circumference      Peak Flow      Pain Score 10     Pain Loc      Pain Edu?      Excl. in GC?     Vital signs reviewed, nursing assessments reviewed.   Constitutional:   Alert and oriented. Non-toxic appearance. Eyes:   Conjunctivae are normal. EOMI. PERRL. ENT      Head:   Normocephalic and atraumatic.      Nose:   No congestion/rhinnorhea.       Mouth/Throat:   MMM, no pharyngeal erythema. No peritonsillar mass.       Neck:   No meningismus. Full ROM. Hematological/Lymphatic/Immunilogical:   No cervical lymphadenopathy. Cardiovascular:   RRR. Symmetric bilateral radial and DP pulses.  No murmurs. Cap refill less than 2 seconds. Respiratory:   Normal respiratory effort without tachypnea/retractions. Breath sounds are clear and equal bilaterally. No wheezes/rales/rhonchi. Gastrointestinal:   Soft and nontender. Non distended. There is no CVA tenderness.  No rebound, rigidity, or guarding. Genitourinary:   No skin ulcers or inguinal lymphadenopathy.  No penile discharge.  Scrotum is noninflamed and nontender.  There is soupy  candidal intertrigo in the skin folds of the groin. Musculoskeletal:   Normal range of motion in all extremities. No joint effusions.  No lower extremity tenderness.  No edema. Neurologic:   Normal speech and language.  Motor grossly intact. No acute focal neurologic deficits are appreciated.  Skin:    Skin is warm, dry and intact. No rash noted.  No petechiae, purpura, or bullae.  ____________________________________________    LABS (pertinent positives/negatives) (all labs ordered are listed, but only abnormal results are displayed) Labs Reviewed  URINALYSIS, COMPLETE (UACMP) WITH MICROSCOPIC - Abnormal; Notable for the following components:      Result Value  Color, Urine AMBER (*)    APPearance HAZY (*)    Protein, ur 30 (*)    Leukocytes, UA SMALL (*)    WBC, UA >50 (*)    All other components within normal limits  BASIC METABOLIC PANEL - Abnormal; Notable for the following components:   Potassium 3.3 (*)    Glucose, Bld 198 (*)    Calcium 8.6 (*)    All other components within normal limits  CBC - Abnormal; Notable for the following components:   WBC 11.1 (*)    Hemoglobin 12.1 (*)    HCT 37.3 (*)    All other components within normal limits  URINE CULTURE   ____________________________________________   EKG    ____________________________________________    RADIOLOGY  Ct Renal Stone Study  Result Date: 11/08/2018 CLINICAL DATA:  LEFT flank pain for 2 days, blood in urine, suspected stone disease EXAM: CT ABDOMEN AND PELVIS WITHOUT CONTRAST TECHNIQUE: Multidetector CT imaging of the abdomen and pelvis was performed following the standard protocol without IV contrast. COMPARISON:  01/08/2017 FINDINGS: Lower chest: Lung bases clear Hepatobiliary: Gallbladder and liver normal appearance Pancreas: Normal appearance Spleen: Normal appearance. Tiny splenule adjacent to inferior spleen. Kidneys: Adrenal glands, kidneys, ureters, and bladder normal appearance. No  urinary tract calcification or dilatation. Stomach/bowel: Normal appendix. Stomach and bowel loops normal appearance for technique. Vascular/lymphatic: Aorta normal caliber. Normal sized lymph nodes in the inguinal regions bilaterally, slightly prominent number but grossly unchanged. No new abdominal or pelvic adenopathy. Reproductive: Unremarkable prostate gland and seminal vesicles Other: No free air free fluid. No acute inflammatory process. Small tiny RIGHT inguinal hernia containing fat. Musculoskeletal: No acute osseous findings. IMPRESSION: No acute intra-abdominal or intrapelvic abnormalities. Electronically Signed   By: Ulyses Southward M.D.   On: 11/08/2018 12:50    ____________________________________________   PROCEDURES Procedures  ____________________________________________  DIFFERENTIAL DIAGNOSIS   Kidney stone, pyelonephritis, urinary tract infection, diverticulitis  CLINICAL IMPRESSION / ASSESSMENT AND PLAN / ED COURSE  Pertinent labs & imaging results that were available during my care of the patient were reviewed by me and considered in my medical decision making (see chart for details).    Patient presents with left flank pain, most likely kidney stone.  He is nontoxic, well-appearing.  Vital signs unremarkable.  CT obtained due to broad differential.  This shows no ureterolithiasis or other acute intra-abdominal disease.  Given his recent sexual activity, patient treated with azithromycin and ceftriaxone and Flagyl for STI.  Also continue him on a week of Keflex as well as nystatin powder for his intertrigo.  Counseled to avoid sexual activity for a week and advise his sexual partners to get tested and treated.  Clinical Course as of Nov 08 1517  Mon Nov 08, 2018  1359 Glucose, UA: NEGATIVE [PS]    Clinical Course User Index [PS] Sharman Cheek, MD     ____________________________________________   FINAL CLINICAL IMPRESSION(S) / ED DIAGNOSES    Final  diagnoses:  Candidiasis, intertrigo  Cystitis     ED Discharge Orders         Ordered    cephALEXin (KEFLEX) 500 MG capsule  2 times daily     11/08/18 1517    nystatin (MYCOSTATIN/NYSTOP) powder  4 times daily     11/08/18 1517          Portions of this note were generated with dragon dictation software. Dictation errors may occur despite best attempts at proofreading.   Sharman Cheek, MD 11/08/18 [phone]

## 2018-11-09 LAB — URINE CULTURE: Culture: <10000 — AB

## 2018-11-12 ENCOUNTER — Encounter: Payer: Self-pay | Admitting: Emergency Medicine

## 2018-11-12 ENCOUNTER — Emergency Department
Admission: EM | Admit: 2018-11-12 | Discharge: 2018-11-12 | Disposition: A | Discharge status: Home / Self Care | Payer: Medicare Other | Attending: Emergency Medicine | Admitting: Emergency Medicine

## 2018-11-12 DIAGNOSIS — N390 Urinary tract infection, site not specified: Secondary | ICD-10-CM | POA: Diagnosis not present

## 2018-11-12 DIAGNOSIS — F17210 Nicotine dependence, cigarettes, uncomplicated: Secondary | ICD-10-CM | POA: Diagnosis not present

## 2018-11-12 DIAGNOSIS — M545 Low back pain, unspecified: Secondary | ICD-10-CM

## 2018-11-12 DIAGNOSIS — I10 Essential (primary) hypertension: Secondary | ICD-10-CM | POA: Diagnosis not present

## 2018-11-12 DIAGNOSIS — E119 Type 2 diabetes mellitus without complications: Secondary | ICD-10-CM | POA: Diagnosis not present

## 2018-11-12 DIAGNOSIS — Z79899 Other long term (current) drug therapy: Secondary | ICD-10-CM | POA: Diagnosis not present

## 2018-11-12 LAB — URINALYSIS, COMPLETE (UACMP) WITH MICROSCOPIC
Bacteria, UA: NONE SEEN
Bilirubin Urine: NEGATIVE
Glucose, UA: NEGATIVE mg/dL
Hgb urine dipstick: NEGATIVE
Ketones, ur: NEGATIVE mg/dL
Nitrite: NEGATIVE
Protein, ur: NEGATIVE mg/dL
Specific Gravity, Urine: 1.023 (ref 1.005–1.030)
pH: 6 (ref 5.0–8.0)

## 2018-11-12 MED ORDER — HYDROCODONE-ACETAMINOPHEN 5-325 MG PO TABS: 1.0000 | ORAL_TABLET | Freq: Four times a day (QID) | ORAL | 0 refills | Status: DC | PRN

## 2018-11-12 MED ORDER — KETOROLAC TROMETHAMINE 30 MG/ML IJ SOLN
30.0000 mg | Freq: Once | INTRAMUSCULAR | Status: AC
  Administered 2018-11-12: 30 mg via INTRAMUSCULAR
  Filled 2018-11-12: qty 1

## 2018-11-12 NOTE — ED Provider Notes (Signed)
[facility] Emergency Department Provider Note  ____________________________________________   First MD Initiated Contact with Patient 11/12/18 1033     (approximate)  I have reviewed the triage vital signs and the nursing notes.   HISTORY  Chief Complaint Back Pain   HPI Walter Pena. is a 37 y.o. male presents to the ED via EMS with complaint of back pain.  Patient states he was seen on 11/08/2018 for left flank pain and was told that most likely he had a kidney stone.  Patient currently is taking antibiotics and states that the pain is still present and is disgruntled because he did not receive any pain medication.  He was also seen and treated at the same time for probable STD.  Patient continues to take antibiotics and has not finished them thus far.  He denies any fever, chills, nausea or vomiting.  He rates his pain as an 8 out of 10.  Past Medical History:  Diagnosis Date  . Gout   . Hidradenitis   . Hypertension   . Schizophrenia [facility])     Patient Active Problem List   Diagnosis Date Noted  . 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 Diabetes (HCC) 02/18/2017  . Schizophrenia (HCC) 02/17/2017  . Alcohol use disorder, moderate, dependence (HCC) 02/17/2017  . Noncompliance 05/07/2016  . HTN (hypertension) 01/21/2016  . Cannabis use disorder, moderate, dependence (HCC) 01/21/2016  . Tobacco use disorder 01/21/2016  . Gout 01/21/2016  . Hidradenitis 01/21/2016    Past Surgical History:  Procedure Laterality Date  . sweat gland removal      Prior to Admission medications   Medication Sig Start Date End Date Taking? Authorizing Provider  allopurinol (ZYLOPRIM) 100 MG tablet Take 100 mg by mouth daily. 01/21/17   [provider]  amantadine (SYMMETREL) 100 MG capsule Take 1 capsule (100 mg total) by mouth 2 (two) times daily. 10/27/17   McNew, Ileene Hutchinson, MD  amLODipine (NORVASC) 10 MG tablet Take 1 tablet (10 mg  total) by mouth daily. 09/18/18   Enid Derry, PA-C  benazepril (LOTENSIN) 40 MG tablet Take 1 tablet (40 mg total) by mouth daily. 10/27/17   McNew, Ileene Hutchinson, MD  benztropine (COGENTIN) 1 MG tablet Take 1 mg by mouth daily.    [provider]  cephALEXin (KEFLEX) 500 MG capsule Take 1 capsule (500 mg total) by mouth 2 (two) times daily. 11/08/18   Sharman Cheek, MD  colchicine 0.6 MG tablet 1 tablet by mouth daily until gout pain is gone. 04/24/16   Joni Reining, PA-C  haloperidol (HALDOL) 5 MG tablet Take 1 tablet (5 mg total) by mouth at bedtime. 10/27/17   McNew, Ileene Hutchinson, MD  HYDROcodone-acetaminophen (NORCO/VICODIN) 5-325 MG tablet Take 1 tablet by mouth every 6 (six) hours as needed for moderate pain. 11/12/18   Tommi Rumps, PA-C  paliperidone (INVEGA SUSTENNA) 156 MG/ML SUSP injection Inject 156 mg into the muscle once.    [provider]  paliperidone (INVEGA SUSTENNA) 234 MG/1.5ML SUSP injection Inject 234 mg into the muscle once for 1 dose. Due May 7 Patient not taking: Reported on 11/16/2017 10/27/17 11/16/17  Haskell Riling, MD  traZODone (DESYREL) 150 MG tablet Take 1 tablet (150 mg total) by mouth at bedtime as needed for sleep. Patient not taking: Reported on 11/16/2017 10/27/17   Haskell Riling, MD    Allergies Bactrim [sulfamethoxazole-trimethoprim]; Lisinopril; and Other  Family History  Problem Relation Age of Onset  .  Diabetes Mellitus II Mother   . Hypertension Mother     Social History Social History   Tobacco Use  . Smoking status: Current Every Day Smoker    Packs/day: 1.00    Years: 9.00    Pack years: 9.00    Types: Cigarettes  . Smokeless tobacco: Never Used  Substance Use Topics  . Alcohol use: Yes  . Drug use: Yes    Types: Marijuana    Review of Systems Constitutional: No fever/chills Eyes: No visual changes. ENT: No sore throat. Cardiovascular: Denies chest pain. Respiratory: Denies shortness of  breath. Gastrointestinal: No abdominal pain.  No nausea, no vomiting.  No diarrhea.  Genitourinary: Negative for dysuria. Musculoskeletal: Positive for back pain. Skin: Negative for rash. Neurological: Negative for headaches, focal weakness or numbness. Psychiatric: History of schizophrenia.   PHYSICAL EXAM:  VITAL SIGNS: ED Triage Vitals  Enc Vitals Group     BP 11/12/18 1036 138/88     Pulse Rate 11/12/18 1036 89     Resp 11/12/18 1036 18     Temp 11/12/18 1036 98.2 F (36.8 C)     Temp Source 11/12/18 1036 Oral     SpO2 11/12/18 1036 97 %     Weight 11/12/18 1038 245 lb 6 oz (111.3 kg)     Height 11/12/18 1038 5\' 9"  (1.753 m)     Head Circumference --      Peak Flow --      Pain Score 11/12/18 1038 8     Pain Loc --      Pain Edu? --      Excl. in GC? --    Constitutional: Alert and oriented. Well appearing and in no acute distress. Eyes: Conjunctivae are normal.  Head: Atraumatic. Neck: No stridor.   Cardiovascular: Normal rate, regular rhythm. Grossly normal heart sounds.  Good peripheral circulation. Respiratory: Normal respiratory effort.  No retractions. Lungs CTAB. Gastrointestinal: Soft and nontender. No distention. No CVA tenderness. Musculoskeletal: Examination of the back there is no gross deformity and range of motion is moderately restricted secondary to patient's discomfort.  There is no point tenderness or step-offs on palpation of the thoracic or lumbar spine.  There is moderate tenderness on palpation of the paravertebral muscles lower lumbar area.  No active muscle spasms were seen.  Good muscle strength bilaterally. Neurologic:  Normal speech and language. No gross focal neurologic deficits are appreciated.  Reflexes are 1+ bilaterally.  Gait was not tested secondary to patient's pain. Skin:  Skin is warm, dry and intact. No rash noted.  No ecchymosis or abrasions were seen. Psychiatric: Mood and affect are normal. Speech and behavior are  normal.  ____________________________________________   LABS (all labs ordered are listed, but only abnormal results are displayed)  Labs Reviewed  URINALYSIS, COMPLETE (UACMP) WITH MICROSCOPIC - Abnormal; Notable for the following components:      Result Value   Color, Urine YELLOW (*)    APPearance CLEAR (*)    Leukocytes, UA SMALL (*)    All other components within normal limits    PROCEDURES  Procedure(s) performed: None  Procedures  Critical Care performed: No  ____________________________________________   INITIAL IMPRESSION / ASSESSMENT AND PLAN / ED COURSE  As part of my medical decision making, I reviewed the following data within the electronic MEDICAL RECORD NUMBER Notes from prior ED visits and [place] Controlled Substance Database  Patient presents via EMS from home with complaint of low back pain.  Patient was seen in  the ED 4 days ago at which time he was diagnosed with having urinary tract infection with possible renal stone.  Patient also was presenting history of unprotected sex with 3 different partners recently and was treated for STDs.  Patient is disgruntled as he did not receive any pain medication for his back.  Urinalysis was repeated and was improved over his last visit.  Patient was given tramadol 30 mg IM while in the ED.  Patient is to continue taking antibiotics until completely finished.  He was given a prescription for Norco 1 every 6 hours as needed for pain.  He is to follow-up with his PCP if any continued problems and encouraged to increase fluids.  ____________________________________________   FINAL CLINICAL IMPRESSION(S) / ED DIAGNOSES  Final diagnoses:  Acute bilateral low back pain without sciatica  Urinary tract infection, acute     ED Discharge Orders         Ordered    HYDROcodone-acetaminophen (NORCO/VICODIN) 5-325 MG tablet  Every 6 hours PRN     11/12/18 1153           Note:  This document was prepared using Dragon voice  recognition software and may include unintentional dictation errors.    Tommi Rumps, PA-C 11/12/18 1650    Emily Filbert, MD 11/15/18 [phone]

## 2018-11-12 NOTE — Discharge Instructions (Signed)
You will need to call and make an appointment with your primary care provider Dr. Ellsworth Lennox for further evaluation of your back pain.  Begin taking medication for pain as needed for back pain.  Also continue taking your antibiotic until completely finished.

## 2018-11-12 NOTE — ED Triage Notes (Signed)
Presents vis EMS from home with lower back pain  States pain is moving into both buttocks  Denies any recent injury  No n/v/d or fever

## 2018-11-12 NOTE — ED Notes (Signed)
First Nurse Note: Patient to ED via [facility] with back pain.  Seen here 3 days ago and now complaining of difficulty walking.  BP-162/90, P-98, 96% on room air.  Hx of HTN, Gout, and DM.  Alert and oriented, seated in WC.

## 2019-01-06 ENCOUNTER — Other Ambulatory Visit: Payer: Self-pay

## 2019-01-06 ENCOUNTER — Encounter: Payer: Self-pay | Admitting: Emergency Medicine

## 2019-01-06 ENCOUNTER — Emergency Department
Admission: EM | Admit: 2019-01-06 | Discharge: 2019-01-06 | Disposition: A | Discharge status: Home / Self Care | Payer: Medicare Other | Attending: Emergency Medicine | Admitting: Emergency Medicine

## 2019-01-06 DIAGNOSIS — M79602 Pain in left arm: Secondary | ICD-10-CM | POA: Diagnosis present

## 2019-01-06 DIAGNOSIS — M79675 Pain in left toe(s): Secondary | ICD-10-CM | POA: Diagnosis not present

## 2019-01-06 DIAGNOSIS — I10 Essential (primary) hypertension: Secondary | ICD-10-CM | POA: Insufficient documentation

## 2019-01-06 DIAGNOSIS — Z79899 Other long term (current) drug therapy: Secondary | ICD-10-CM | POA: Diagnosis not present

## 2019-01-06 DIAGNOSIS — F17210 Nicotine dependence, cigarettes, uncomplicated: Secondary | ICD-10-CM | POA: Insufficient documentation

## 2019-01-06 DIAGNOSIS — F1210 Cannabis abuse, uncomplicated: Secondary | ICD-10-CM | POA: Insufficient documentation

## 2019-01-06 DIAGNOSIS — R109 Unspecified abdominal pain: Secondary | ICD-10-CM | POA: Insufficient documentation

## 2019-01-06 DIAGNOSIS — M7918 Myalgia, other site: Secondary | ICD-10-CM

## 2019-01-06 MED ORDER — KETOROLAC TROMETHAMINE 30 MG/ML IJ SOLN
INTRAMUSCULAR | Status: AC
  Filled 2019-01-06: qty 1

## 2019-01-06 MED ORDER — KETOROLAC TROMETHAMINE 30 MG/ML IJ SOLN
30.0000 mg | Freq: Once | INTRAMUSCULAR | Status: AC
  Administered 2019-01-06: 30 mg via INTRAMUSCULAR

## 2019-01-06 NOTE — ED Notes (Signed)
Pt has called mother for ride, pt will be waiting in wc in lobby for ride

## 2019-01-06 NOTE — ED Triage Notes (Signed)
PT c/o gout to LFT foot as well as back and flank pain. PT states decreased urine output. PT states he was seen at PCP yesterday and given RX but doesn't know what they were and wasn't able to pick them up yet.

## 2019-01-06 NOTE — ED Provider Notes (Signed)
[facility] Emergency Department Provider Note   ____________________________________________    I have reviewed the triage vital signs and the nursing notes.   HISTORY  Chief Complaint Flank Pain and Gout     HPI Walter Pena. is a 38 y.o. male with history of schizophrenia, hypertension, gout who presents today with complaints of pain.  Patient describes left arm pain, bilateral flank pain and left toe pain.  He reports he went to his PCP yesterday who prescribed him pain medications but he has not gone to the pharmacy to pick up these medications because he had difficulty getting a ride.  Hence he decided to call EMS and come to the emergency department.  Patient has a long history of complaints of flank pain, had a CT scan in December which was negative for any ureterolithiasis or renal stones or abnormalities.   Past Medical History:  Diagnosis Date  . Gout   . Hidradenitis   . Hypertension   . Schizophrenia [facility])     Patient Active Problem List   Diagnosis Date Noted  . 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 Diabetes (HCC) 02/18/2017  . Schizophrenia (HCC) 02/17/2017  . Alcohol use disorder, moderate, dependence (HCC) 02/17/2017  . Noncompliance 05/07/2016  . HTN (hypertension) 01/21/2016  . Cannabis use disorder, moderate, dependence (HCC) 01/21/2016  . Tobacco use disorder 01/21/2016  . Gout 01/21/2016  . Hidradenitis 01/21/2016    Past Surgical History:  Procedure Laterality Date  . sweat gland removal      Prior to Admission medications   Medication Sig Start Date End Date Taking? Authorizing Provider  allopurinol (ZYLOPRIM) 100 MG tablet Take 100 mg by mouth daily. 01/21/17   [provider]  amantadine (SYMMETREL) 100 MG capsule Take 1 capsule (100 mg total) by mouth 2 (two) times daily. 10/27/17   McNew, Ileene Hutchinson, MD  amLODipine (NORVASC) 10 MG tablet Take 1 tablet (10 mg total) by mouth daily.  09/18/18   Enid Derry, PA-C  benazepril (LOTENSIN) 40 MG tablet Take 1 tablet (40 mg total) by mouth daily. 10/27/17   McNew, Ileene Hutchinson, MD  benztropine (COGENTIN) 1 MG tablet Take 1 mg by mouth daily.    [provider]  cephALEXin (KEFLEX) 500 MG capsule Take 1 capsule (500 mg total) by mouth 2 (two) times daily. 11/08/18   Sharman Cheek, MD  colchicine 0.6 MG tablet 1 tablet by mouth daily until gout pain is gone. 04/24/16   Joni Reining, PA-C  haloperidol (HALDOL) 5 MG tablet Take 1 tablet (5 mg total) by mouth at bedtime. 10/27/17   McNew, Ileene Hutchinson, MD  HYDROcodone-acetaminophen (NORCO/VICODIN) 5-325 MG tablet Take 1 tablet by mouth every 6 (six) hours as needed for moderate pain. 11/12/18   Tommi Rumps, PA-C  paliperidone (INVEGA SUSTENNA) 156 MG/ML SUSP injection Inject 156 mg into the muscle once.    [provider]  paliperidone (INVEGA SUSTENNA) 234 MG/1.5ML SUSP injection Inject 234 mg into the muscle once for 1 dose. Due May 7 Patient not taking: Reported on 11/16/2017 10/27/17 11/16/17  Haskell Riling, MD  traZODone (DESYREL) 150 MG tablet Take 1 tablet (150 mg total) by mouth at bedtime as needed for sleep. Patient not taking: Reported on 11/16/2017 10/27/17   Haskell Riling, MD     Allergies Bactrim [sulfamethoxazole-trimethoprim]; Lisinopril; and Other  Family History  Problem Relation Age of Onset  . Diabetes Mellitus II Mother   . Hypertension Mother  Social History Social History   Tobacco Use  . Smoking status: Current Every Day Smoker    Packs/day: 1.00    Years: 9.00    Pack years: 9.00    Types: Cigarettes  . Smokeless tobacco: Never Used  Substance Use Topics  . Alcohol use: Yes  . Drug use: Yes    Types: Marijuana    Review of Systems  Constitutional: No fever/chills Eyes: No visual changes.  ENT: No sore throat. Cardiovascular: Denies chest pain. Respiratory: Denies shortness of breath. Gastrointestinal: As above,  no nausea or vomiting Genitourinary: Negative for dysuria. Musculoskeletal: Toe pain left arm pain Skin: Negative for rash. Neurological: Negative for headaches   ____________________________________________   PHYSICAL EXAM:  VITAL SIGNS: ED Triage Vitals  Enc Vitals Group     BP 01/06/19 0853 129/82     Pulse Rate 01/06/19 0853 97     Resp 01/06/19 0852 16     Temp 01/06/19 0856 97.9 F (36.6 C)     Temp Source 01/06/19 0852 Oral     SpO2 01/06/19 0853 98 %     Weight --      Height --      Head Circumference --      Peak Flow --      Pain Score 01/06/19 0852 8     Pain Loc --      Pain Edu? --      Excl. in GC? --     Constitutional: Alert and oriented.  Eyes: Conjunctivae are normal.   Nose: No congestion/rhinnorhea. Mouth/Throat: Mucous membranes are moist.    Cardiovascular: Normal rate, regular rhythm. Grossly normal heart sounds.  Good peripheral circulation. Respiratory: Normal respiratory effort.  No retractions. Lungs CTAB. Gastrointestinal: Soft and nontender. No distention.  No CVA tenderness.  Reassuring exam  Musculoskeletal:.  Warm and well perfused.  No significant swelling or erythema of the toe, mild tenderness.  Left arm with some pain with abduction but otherwise normal range of motion, no abnormalities, 2+ pulses, no redness or injury or bruising Neurologic:  Normal speech and language. No gross focal neurologic deficits are appreciated.  Skin:  Skin is warm, dry and intact. No rash noted. Psychiatric: Mood and affect are normal. Speech and behavior are normal.  ____________________________________________   LABS (all labs ordered are listed, but only abnormal results are displayed)  Labs Reviewed - No data to display ____________________________________________  EKG  None ____________________________________________  RADIOLOGY  None ____________________________________________   PROCEDURES  Procedure(s) performed:  No  Procedures   Critical Care performed: No ____________________________________________   INITIAL IMPRESSION / ASSESSMENT AND PLAN / ED COURSE  Pertinent labs & imaging results that were available during my care of the patient were reviewed by me and considered in my medical decision making (see chart for details).  Patient well-appearing in no acute distress here for complaints of multiple essentially chronic issues.  Will give a shot of IM Toradol and discharge the patient with follow-up with his PCP    ____________________________________________   FINAL CLINICAL IMPRESSION(S) / ED DIAGNOSES  Final diagnoses:  Flank pain  Musculoskeletal pain        Note:  This document was prepared using Dragon voice recognition software and may include unintentional dictation errors.   Jene Every, MD 01/06/19 [phone]

## 2019-07-21 ENCOUNTER — Ambulatory Visit: Payer: Medicare Other | Admitting: Podiatry

## 2019-07-28 ENCOUNTER — Inpatient Hospital Stay
Admission: EM | Admit: 2019-07-28 | Discharge: 2019-07-28 | DRG: 951 | Discharge status: Against Medical Advice | Payer: Medicare Other | Attending: Specialist | Admitting: Specialist

## 2019-07-28 ENCOUNTER — Emergency Department
Admission: EM | Admit: 2019-07-28 | Discharge: 2019-07-28 | Discharge status: Against Medical Advice | Payer: Medicare Other | Source: Home / Self Care

## 2019-07-28 ENCOUNTER — Other Ambulatory Visit: Payer: Self-pay

## 2019-07-28 ENCOUNTER — Encounter: Payer: Self-pay | Admitting: Emergency Medicine

## 2019-07-28 ENCOUNTER — Emergency Department: Payer: Medicare Other

## 2019-07-28 DIAGNOSIS — F17210 Nicotine dependence, cigarettes, uncomplicated: Secondary | ICD-10-CM | POA: Diagnosis not present

## 2019-07-28 DIAGNOSIS — R0902 Hypoxemia: Secondary | ICD-10-CM

## 2019-07-28 DIAGNOSIS — R0600 Dyspnea, unspecified: Secondary | ICD-10-CM

## 2019-07-28 DIAGNOSIS — F209 Schizophrenia, unspecified: Secondary | ICD-10-CM | POA: Diagnosis not present

## 2019-07-28 DIAGNOSIS — I110 Hypertensive heart disease with heart failure: Secondary | ICD-10-CM | POA: Diagnosis present

## 2019-07-28 DIAGNOSIS — E119 Type 2 diabetes mellitus without complications: Secondary | ICD-10-CM | POA: Diagnosis present

## 2019-07-28 DIAGNOSIS — F1010 Alcohol abuse, uncomplicated: Secondary | ICD-10-CM | POA: Diagnosis present

## 2019-07-28 DIAGNOSIS — Z8249 Family history of ischemic heart disease and other diseases of the circulatory system: Secondary | ICD-10-CM

## 2019-07-28 DIAGNOSIS — J441 Chronic obstructive pulmonary disease with (acute) exacerbation: Secondary | ICD-10-CM | POA: Diagnosis present

## 2019-07-28 DIAGNOSIS — Z881 Allergy status to other antibiotic agents status: Secondary | ICD-10-CM

## 2019-07-28 DIAGNOSIS — J9601 Acute respiratory failure with hypoxia: Secondary | ICD-10-CM | POA: Diagnosis not present

## 2019-07-28 DIAGNOSIS — Z79899 Other long term (current) drug therapy: Secondary | ICD-10-CM | POA: Diagnosis not present

## 2019-07-28 DIAGNOSIS — I509 Heart failure, unspecified: Secondary | ICD-10-CM | POA: Diagnosis present

## 2019-07-28 DIAGNOSIS — M109 Gout, unspecified: Secondary | ICD-10-CM | POA: Diagnosis not present

## 2019-07-28 DIAGNOSIS — Z7984 Long term (current) use of oral hypoglycemic drugs: Secondary | ICD-10-CM | POA: Diagnosis not present

## 2019-07-28 DIAGNOSIS — Z833 Family history of diabetes mellitus: Secondary | ICD-10-CM | POA: Diagnosis not present

## 2019-07-28 DIAGNOSIS — Z20828 Contact with and (suspected) exposure to other viral communicable diseases: Principal | ICD-10-CM | POA: Diagnosis present

## 2019-07-28 DIAGNOSIS — Z888 Allergy status to other drugs, medicaments and biological substances status: Secondary | ICD-10-CM | POA: Diagnosis not present

## 2019-07-28 DIAGNOSIS — E669 Obesity, unspecified: Secondary | ICD-10-CM | POA: Diagnosis not present

## 2019-07-28 DIAGNOSIS — I10 Essential (primary) hypertension: Secondary | ICD-10-CM

## 2019-07-28 DIAGNOSIS — Z6841 Body Mass Index (BMI) 40.0 and over, adult: Secondary | ICD-10-CM | POA: Diagnosis not present

## 2019-07-28 DIAGNOSIS — R0602 Shortness of breath: Secondary | ICD-10-CM | POA: Diagnosis present

## 2019-07-28 LAB — CBC WITH DIFFERENTIAL/PLATELET
Abs Immature Granulocytes: 0.08 10*3/uL — ABNORMAL HIGH (ref 0.00–0.07)
Basophils Absolute: 0 10*3/uL (ref 0.0–0.1)
Basophils Relative: 0 %
Eosinophils Absolute: 0.1 10*3/uL (ref 0.0–0.5)
Eosinophils Relative: 1 %
HCT: 33.2 % — ABNORMAL LOW (ref 39.0–52.0)
Hemoglobin: 10.7 g/dL — ABNORMAL LOW (ref 13.0–17.0)
Immature Granulocytes: 1 %
Lymphocytes Relative: 12 %
Lymphs Abs: 1.9 10*3/uL (ref 0.7–4.0)
MCH: 26.6 pg (ref 26.0–34.0)
MCHC: 32.2 g/dL (ref 30.0–36.0)
MCV: 82.4 fL (ref 80.0–100.0)
Monocytes Absolute: 0.7 10*3/uL (ref 0.1–1.0)
Monocytes Relative: 5 %
Neutro Abs: 12.6 10*3/uL — ABNORMAL HIGH (ref 1.7–7.7)
Neutrophils Relative %: 81 %
Platelets: 325 10*3/uL (ref 150–400)
RBC: 4.03 MIL/uL — ABNORMAL LOW (ref 4.22–5.81)
RDW: 17.2 % — ABNORMAL HIGH (ref 11.5–15.5)
WBC: 15.4 10*3/uL — ABNORMAL HIGH (ref 4.0–10.5)
nRBC: 0 % (ref 0.0–0.2)

## 2019-07-28 LAB — SARS CORONAVIRUS 2 BY RT PCR (HOSPITAL ORDER, PERFORMED IN ~~LOC~~ HOSPITAL LAB): SARS Coronavirus 2: NEGATIVE

## 2019-07-28 LAB — TROPONIN I (HIGH SENSITIVITY): Troponin I (High Sensitivity): 28 ng/L — ABNORMAL HIGH (ref <18)

## 2019-07-28 LAB — BASIC METABOLIC PANEL
Anion gap: 12 (ref 5–15)
BUN: 9 mg/dL (ref 6–20)
CO2: 26 mmol/L (ref 22–32)
Calcium: 8.6 mg/dL — ABNORMAL LOW (ref 8.9–10.3)
Chloride: 100 mmol/L (ref 98–111)
Creatinine, Ser: 0.94 mg/dL (ref 0.61–1.24)
GFR calc Af Amer: >60 mL/min (ref >60)
GFR calc non Af Amer: >60 mL/min (ref >60)
Glucose, Bld: 153 mg/dL — ABNORMAL HIGH (ref 70–99)
Potassium: 3.3 mmol/L — ABNORMAL LOW (ref 3.5–5.1)
Sodium: 138 mmol/L (ref 135–145)

## 2019-07-28 LAB — BRAIN NATRIURETIC PEPTIDE: B Natriuretic Peptide: 154 pg/mL — ABNORMAL HIGH (ref 0.0–100.0)

## 2019-07-28 MED ORDER — INSULIN ASPART 100 UNIT/ML ~~LOC~~ SOLN: 0.0000 [IU] | Freq: Three times a day (TID) | SUBCUTANEOUS | Status: DC

## 2019-07-28 MED ORDER — METHYLPREDNISOLONE SODIUM SUCC 125 MG IJ SOLR
INTRAMUSCULAR | Status: AC
  Administered 2019-07-28: 125 mg via INTRAVENOUS
  Filled 2019-07-28: qty 2

## 2019-07-28 MED ORDER — METHYLPREDNISOLONE SODIUM SUCC 125 MG IJ SOLR
125.0000 mg | Freq: Once | INTRAMUSCULAR | Status: AC
  Administered 2019-07-28 (×2): 125 mg via INTRAVENOUS
  Filled 2019-07-28: qty 2

## 2019-07-28 MED ORDER — LABETALOL HCL 5 MG/ML IV SOLN
10.0000 mg | Freq: Once | INTRAVENOUS | Status: AC
  Administered 2019-07-28: 10 mg via INTRAVENOUS
  Filled 2019-07-28: qty 4

## 2019-07-28 MED ORDER — IPRATROPIUM-ALBUTEROL 0.5-2.5 (3) MG/3ML IN SOLN
3.0000 mL | Freq: Once | RESPIRATORY_TRACT | Status: AC
  Administered 2019-07-28: 3 mL via RESPIRATORY_TRACT
  Filled 2019-07-28: qty 3

## 2019-07-28 MED ORDER — INSULIN ASPART 100 UNIT/ML ~~LOC~~ SOLN: 0.0000 [IU] | Freq: Every day | SUBCUTANEOUS | Status: DC

## 2019-07-28 MED ORDER — FUROSEMIDE 10 MG/ML IJ SOLN
20.0000 mg | Freq: Once | INTRAMUSCULAR | Status: AC
  Administered 2019-07-28: 14:00:00 20 mg via INTRAVENOUS
  Filled 2019-07-28: qty 4

## 2019-07-28 MED ORDER — IPRATROPIUM-ALBUTEROL 0.5-2.5 (3) MG/3ML IN SOLN
3.0000 mL | Freq: Once | RESPIRATORY_TRACT | Status: AC
  Administered 2019-07-28: 13:00:00 3 mL via RESPIRATORY_TRACT
  Filled 2019-07-28: qty 3

## 2019-07-28 MED ORDER — LEVOFLOXACIN IN D5W 750 MG/150ML IV SOLN
750.0000 mg | Freq: Once | INTRAVENOUS | Status: AC
  Administered 2019-07-28: 750 mg via INTRAVENOUS
  Filled 2019-07-28: qty 150

## 2019-07-28 NOTE — ED Notes (Signed)
Pt st he is leaving and will come back "tomorrow"

## 2019-07-28 NOTE — ED Provider Notes (Signed)
[facility] Emergency Department Provider Note       Time seen: ----------------------------------------- 11:28 AM on 07/28/2019 -----------------------------------------   I have reviewed the triage vital signs and the nursing notes.  HISTORY   Chief Complaint Shortness of Breath    HPI Walter Pena. is a 38 y.o. male with a history of gout, schizophrenia, diabetes, alcohol use disorder who presents to the ED for difficulty breathing.  Patient describes cough and congestion for the past 2 days.  Patient states he cannot catch his breath, was noted to have labored breathing and hypoxia on arrival.  Past Medical History:  Diagnosis Date  . Gout   . Hidradenitis   . Hypertension   . Schizophrenia [facility])     Patient Active Problem List   Diagnosis Date Noted  . 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 Diabetes (HCC) 02/18/2017  . Schizophrenia (HCC) 02/17/2017  . Alcohol use disorder, moderate, dependence (HCC) 02/17/2017  . Noncompliance 05/07/2016  . HTN (hypertension) 01/21/2016  . Cannabis use disorder, moderate, dependence (HCC) 01/21/2016  . Tobacco use disorder 01/21/2016  . Gout 01/21/2016  . Hidradenitis 01/21/2016    Past Surgical History:  Procedure Laterality Date  . sweat gland removal      Allergies Bactrim [sulfamethoxazole-trimethoprim], Lisinopril, and Other  Social History Social History   Tobacco Use  . Smoking status: Current Every Day Smoker    Packs/day: 1.00    Years: 9.00    Pack years: 9.00    Types: Cigarettes  . Smokeless tobacco: Never Used  Substance Use Topics  . Alcohol use: Yes  . Drug use: Yes    Types: Marijuana   Review of Systems Constitutional: Negative for fever. Cardiovascular: Negative for chest pain. Respiratory: Positive for shortness of breath Gastrointestinal: Negative for abdominal pain, vomiting and diarrhea. Musculoskeletal: Negative for back pain. Skin:  Negative for rash. Neurological: Negative for headaches, focal weakness or numbness.  All systems negative/normal/unremarkable except as stated in the HPI  ____________________________________________   PHYSICAL EXAM:  VITAL SIGNS: ED Triage Vitals [07/28/19 1057]  Enc Vitals Group     BP (!) 196/111     Pulse Rate (!) 111     Resp (!) 32     Temp 98.9 F (37.2 C)     Temp Source Oral     SpO2 94 %     Weight 275 lb (124.7 kg)     Height 5\' 8"  (1.727 m)     Head Circumference      Peak Flow      Pain Score 8     Pain Loc      Pain Edu?      Excl. in GC?    Constitutional: Alert and oriented.  Moderate distress Eyes: Conjunctivae are normal. Normal extraocular movements. ENT      Head: Normocephalic and atraumatic.      Nose: No congestion/rhinnorhea.      Mouth/Throat: Mucous membranes are moist.      Neck: No stridor. Cardiovascular: Normal rate, regular rhythm. No murmurs, rubs, or gallops. Respiratory: Tachypnea with rales and rhonchi bilaterally Gastrointestinal: Soft and nontender. Normal bowel sounds Musculoskeletal: Nontender with normal range of motion in extremities. No lower extremity tenderness nor edema. Neurologic:  Normal speech and language. No gross focal neurologic deficits are appreciated.  Skin:  Skin is warm, dry and intact. No rash noted. Psychiatric: Mood and affect are normal. Speech and behavior are normal.  ____________________________________________  EKG: Interpreted by me.  Sinus tachycardia with a rate of 101 bpm, LVH with repolarization abnormality, normal axis, normal QT  ____________________________________________  ED COURSE:  As part of my medical decision making, I reviewed the following data within the electronic MEDICAL RECORD NUMBER History obtained from family if available, nursing notes, old chart and ekg, as well as notes from prior ED visits. Patient presented for dyspnea, we will assess with labs and imaging as indicated at  this time.   Procedures  Walter Headingharles Lewis Stemmer Jr. was evaluated in Emergency Department on 07/28/2019 for the symptoms described in the history of present illness. He was evaluated in the context of the global COVID-19 pandemic, which necessitated consideration that the patient might be at risk for infection with the SARS-CoV-2 virus that causes COVID-19. Institutional protocols and algorithms that pertain to the evaluation of patients at risk for COVID-19 are in a state of rapid change based on information released by regulatory bodies including the CDC and federal and state organizations. These policies and algorithms were followed during the patient's care in the ED.  ____________________________________________   LABS (pertinent positives/negatives)  Labs Reviewed  CBC WITH DIFFERENTIAL/PLATELET - Abnormal; Notable for the following components:      Result Value   WBC 15.4 (*)    RBC 4.03 (*)    Hemoglobin 10.7 (*)    HCT 33.2 (*)    RDW 17.2 (*)    Neutro Abs 12.6 (*)    Abs Immature Granulocytes 0.08 (*)    All other components within normal limits  BASIC METABOLIC PANEL - Abnormal; Notable for the following components:   Potassium 3.3 (*)    Glucose, Bld 153 (*)    Calcium 8.6 (*)    All other components within normal limits  BRAIN NATRIURETIC PEPTIDE - Abnormal; Notable for the following components:   B Natriuretic Peptide 154.0 (*)    All other components within normal limits  TROPONIN I (HIGH SENSITIVITY) - Abnormal; Notable for the following components:   Troponin I (High Sensitivity) 28 (*)    All other components within normal limits  SARS CORONAVIRUS 2 (HOSPITAL ORDER, PERFORMED IN [place] [facility])  BLOOD GAS, VENOUS   CRITICAL CARE Performed by: Ulice DashJohnathan E Saige Busby   Total critical care time: 30 minutes  Critical care time was exclusive of separately billable procedures and treating other patients.  Critical care was necessary to treat or prevent  imminent or life-threatening deterioration.  Critical care was time spent personally by me on the following activities: development of treatment plan with patient and/or surrogate as well as nursing, discussions with consultants, evaluation of patient's response to treatment, examination of patient, obtaining history from patient or surrogate, ordering and performing treatments and interventions, ordering and review of laboratory studies, ordering and review of radiographic studies, pulse oximetry and re-evaluation of patient's condition.  RADIOLOGY Images were viewed by me  Chest x-ray IMPRESSION: No edema or consolidation.  Heart upper normal in size. ____________________________________________   DIFFERENTIAL DIAGNOSIS   CHF, COPD, pneumonia, COVID-19  FINAL ASSESSMENT AND PLAN  Dyspnea, hypoxia, hypertension   Plan: The patient had presented for shortness of breath. Patient's labs revealed acute on chronic leukocytosis with a mildly elevated troponin. Patient's imaging did not reveal any specific etiology as the cause.  Initially we gave him 3 duo nebs with steroids which seemed to improve him some, but his shortness of breath worsened.  I then have ordered some Lasix as well as Levaquin and additional labetalol for his blood  pressure.  I will discuss with the hospitalist for admission.   Laurence Aly, MD    Note: This note was generated in part or whole with voice recognition software. Voice recognition is usually quite accurate but there are transcription errors that can and very often do occur. I apologize for any typographical errors that were not detected and corrected.     Earleen Newport, MD 07/28/19 1339

## 2019-07-28 NOTE — ED Notes (Signed)
Pt not compliant with HTN medication. Last time pt took BP med was "2 days ago" and "I take it sometimes".

## 2019-07-28 NOTE — ED Notes (Signed)
ED Provider Williams at bedside. 

## 2019-07-28 NOTE — ED Notes (Signed)
ED TO INPATIENT HANDOFF REPORT  ED Nurse Name and Phone #: [phone]  S Name/Age/Gender Walter Headingharles Lewis Andes Jr. 38 y.o. male Room/Bed: ED11A/ED11A  Code Status   Code Status: Prior  Home/SNF/Other Home A/Ox4 Is this baseline? Yes   Triage Complete: Triage complete  Chief Complaint diff breathing cough  Triage Note Pt c/o having cough with congestino for the pat 2 days, states today he is not able to catch his breathe, pt has labored breathing on arrival at rest.    Allergies Allergies  Allergen Reactions  . Bactrim [Sulfamethoxazole-Trimethoprim] Nausea And Vomiting  . Lisinopril Swelling  . Other Rash    aloe    Level of Care/Admitting Diagnosis ED Disposition    ED Disposition Condition Comment   Admit  The patient appears reasonably stabilized for admission considering the current resources, flow, and capabilities available in the ED at this time, and I doubt any other [facility] further screening and/or treatment in the ED prior to admission is  present.       B Medical/Surgery History Past Medical History:  Diagnosis Date  . Gout   . Hidradenitis   . Hypertension   . Schizophrenia [facility](HCC)    Past Surgical History:  Procedure Laterality Date  . sweat gland removal       A IV Location/Drains/Wounds Patient Lines/Drains/Airways Status   Active Line/Drains/Airways    Name:   Placement date:   Placement time:   Site:   Days:   Peripheral IV 07/28/19 Right Antecubital   07/28/19    1119    Antecubital   less than 1          Intake/Output Last 24 hours No intake or output data in the 24 hours ending 07/28/19 1352  Labs/Imaging Results for orders placed or performed during the hospital encounter of 07/28/19 (from the past 48 hour(s))  CBC with Differential     Status: Abnormal   Collection Time: 07/28/19 11:27 AM  Result Value Ref Range   WBC 15.4 (H) 4.0 - 10.5 K/uL   RBC 4.03 (L) 4.22 - 5.81 MIL/uL   Hemoglobin 10.7 (L) 13.0 - 17.0 g/dL   HCT  96.033.2 (L) 45.439.0 - 52.0 %   MCV 82.4 80.0 - 100.0 fL   MCH 26.6 26.0 - 34.0 pg   MCHC 32.2 30.0 - 36.0 g/dL   RDW 09.817.2 (H) 11.911.5 - 14.715.5 %   Platelets 325 150 - 400 K/uL   nRBC 0.0 0.0 - 0.2 %   Neutrophils Relative % 81 %   Neutro Abs 12.6 (H) 1.7 - 7.7 K/uL   Lymphocytes Relative 12 %   Lymphs Abs 1.9 0.7 - 4.0 K/uL   Monocytes Relative 5 %   Monocytes Absolute 0.7 0.1 - 1.0 K/uL   Eosinophils Relative 1 %   Eosinophils Absolute 0.1 0.0 - 0.5 K/uL   Basophils Relative 0 %   Basophils Absolute 0.0 0.0 - 0.1 K/uL   Immature Granulocytes 1 %   Abs Immature Granulocytes 0.08 (H) 0.00 - 0.07 K/uL    Comment: Performed at [facility], [address] [place], [place] [address]  Basic metabolic panel     Status: Abnormal   Collection Time: 07/28/19 11:27 AM  Result Value Ref Range   Sodium 138 135 - 145 mmol/L   Potassium 3.3 (L) 3.5 - 5.1 mmol/L   Chloride 100 98 - 111 mmol/L   CO2 26 22 - 32 mmol/L   Glucose, Bld 153 (H) 70 -  99 mg/dL   BUN 9 6 - 20 mg/dL   Creatinine, Ser 1.610.94 0.61 - 1.24 mg/dL   Calcium 8.6 (L) 8.9 - 10.3 mg/dL   GFR calc non Af Amer >60 >60 mL/min   GFR calc Af Amer >60 >60 mL/min   Anion gap 12 5 - 15    Comment: Performed at [facility], [address] [place], [place] [address]  Brain natriuretic peptide     Status: Abnormal   Collection Time: 07/28/19 11:27 AM  Result Value Ref Range   B Natriuretic Peptide 154.0 (H) 0.0 - 100.0 pg/mL    Comment: Performed at [facility], [address] [place], [place] [address]  Troponin I (High Sensitivity)     Status: Abnormal   Collection Time: 07/28/19 11:27 AM  Result Value Ref Range   Troponin I (High Sensitivity) 28 (H) <18 ng/L    Comment: (NOTE) Elevated high sensitivity troponin I (hsTnI) values and significant  changes across serial measurements may suggest ACS but many other  chronic and acute conditions are known to elevate hsTnI results.  Refer to the "Links" section  for chest pain algorithms and additional  guidance. Performed at [facility], [address] [place], [place] [address]   SARS Coronavirus 2 [facility](Hospital order, Performed in [facility] lab) Nasopharyngeal Nasopharyngeal Swab     Status: None   Collection Time: 07/28/19 11:38 AM   Specimen: Nasopharyngeal Swab  Result Value Ref Range   SARS Coronavirus 2 NEGATIVE NEGATIVE    Comment: (NOTE) If result is NEGATIVE SARS-CoV-2 target nucleic acids are NOT DETECTED. The SARS-CoV-2 RNA is generally detectable in upper and lower  respiratory specimens during the acute phase of infection. The lowest  concentration of SARS-CoV-2 viral copies this assay can detect is 250  copies / mL. A negative result does not preclude SARS-CoV-2 infection  and should not be used as the sole basis for treatment or other  patient management decisions.  A negative result may occur with  improper specimen collection / handling, submission of specimen other  than nasopharyngeal swab, presence of viral mutation(s) within the  areas targeted by this assay, and inadequate number of viral copies  (<250 copies / mL). A negative result must be combined with clinical  observations, patient history, and epidemiological information. If result is POSITIVE SARS-CoV-2 target nucleic acids are DETECTED. The SARS-CoV-2 RNA is generally detectable in upper and lower  respiratory specimens dur ing the acute phase of infection.  Positive  results are indicative of active infection with SARS-CoV-2.  Clinical  correlation with patient history and other diagnostic information is  necessary to determine patient infection status.  Positive results do  not rule out bacterial infection or co-infection with other viruses. If result is PRESUMPTIVE POSTIVE SARS-CoV-2 nucleic acids MAY BE PRESENT.   A presumptive positive result was obtained on the submitted specimen  and confirmed on repeat testing.  While 2019 novel  coronavirus  (SARS-CoV-2) nucleic acids may be present in the submitted sample  additional confirmatory testing may be necessary for epidemiological  and / or clinical management purposes  to differentiate between  SARS-CoV-2 and other Sarbecovirus currently known to infect humans.  If clinically indicated additional testing with an alternate test  methodology [phone]([id number]) is advised. The SARS-CoV-2 RNA is generally  detectable in upper and lower respiratory sp ecimens during the acute  phase of infection. The expected result is Negative. Fact Sheet for Patients:  BoilerBrush.com.cyhttps://www.fda.gov/media/136312/download Fact Sheet  for Healthcare Providers: BankingDealers.co.za This test is not yet approved or cleared by the [place] and has been authorized for detection and/or diagnosis of SARS-CoV-2 by FDA under an Emergency Use Authorization (EUA).  This EUA will remain in effect (meaning this test can be used) for the duration of the COVID-19 declaration under Section 564(b)(1) of the Act, 21 U.S.C. section 360bbb-3(b)(1), unless the authorization is terminated or revoked sooner. Performed at [facility], [address] [place], [place] [address]    Dg Chest Port 1 View  Result Date: 07/28/2019 CLINICAL DATA:  Shortness of breath and cough EXAM: PORTABLE CHEST 1 VIEW COMPARISON:  March 06, 2013 FINDINGS: There is no edema or consolidation. Heart is upper normal in size with pulmonary vascularity normal. No adenopathy. No bone lesions. IMPRESSION: No edema or consolidation.  Heart upper normal in size. Electronically Signed   By: Lowella Grip III M.D.   On: 07/28/2019 12:10    Pending Labs Unresulted Labs (From admission, onward)    Start     Ordered   07/28/19 1339  Blood gas, venous  ONCE - STAT,   STAT     07/28/19 1338          Vitals/Pain Today's Vitals   07/28/19 1131 07/28/19 1200 07/28/19 1230 07/28/19 1245  BP:  (!) 156/112 (!)  159/107 (!) 137/99  Pulse:  97 99 94  Resp:  (!) 26 (!) 29 (!) 27  Temp:      TempSrc:      SpO2: 96% 93% 94% 95%  Weight:      Height:      PainSc:        Isolation Precautions Airborne and Contact precautions  Medications Medications  levofloxacin (LEVAQUIN) IVPB 750 mg (750 mg Intravenous New Bag/Given 07/28/19 1352)  ipratropium-albuterol (DUONEB) 0.5-2.5 (3) MG/3ML nebulizer solution 3 mL (3 mLs Nebulization Given 07/28/19 1141)  ipratropium-albuterol (DUONEB) 0.5-2.5 (3) MG/3ML nebulizer solution 3 mL (3 mLs Nebulization Given 07/28/19 1243)  ipratropium-albuterol (DUONEB) 0.5-2.5 (3) MG/3ML nebulizer solution 3 mL (3 mLs Nebulization Given 07/28/19 1243)  methylPREDNISolone sodium succinate (SOLU-MEDROL) 125 mg/2 mL injection 125 mg (125 mg Intravenous Given 07/28/19 1256)  labetalol (NORMODYNE) injection 10 mg (10 mg Intravenous Given 07/28/19 1239)  labetalol (NORMODYNE) injection 10 mg (10 mg Intravenous Given 07/28/19 1348)  furosemide (LASIX) injection 20 mg (20 mg Intravenous Given 07/28/19 1347)    Mobility walks Low fall risk   Focused Assessments    R Recommendations: See Admitting Provider Note  Report given to:   Additional Notes:

## 2019-07-28 NOTE — ED Notes (Addendum)
ED Provider Williams at bedside. 

## 2019-07-28 NOTE — ED Notes (Signed)
Pt walking out of room 11 with a steady gait and no c/o SHOB. Pt has clothes back on and hoes. Pt st "I want to be transferred to another hospital now". This Rn sent a message to MD Abel Presto for guidance.

## 2019-07-28 NOTE — ED Notes (Addendum)
Pt from triage to room 11 w/o oxygen/[place]. Pt hypoxic on arrival sating at 92%.This RN placed pt on 4L via [place] sating at 96%.

## 2019-07-28 NOTE — ED Triage Notes (Signed)
Walter Pena was seen earlier today by MD Jimmye Norman and was going to be admitted for SOB. Walter Pena left AMA before being admitted. Walter Pena states he is back now because " I just decided I want the treatment". Walter Pena in NAD, denies any CP.   This RN spoke with MD Joni Fears, EKG orders only at this time. Walter Pena had full work up prior to leaving

## 2019-07-28 NOTE — ED Notes (Signed)
Pt walking out of room with a  Steady gait. Pt st "I just wanted to know if I had that covid stuff, but I'm good now".

## 2019-07-28 NOTE — H&P (Signed)
[facility] NAME: Walter Pena    MR#:  [id number]  DATE OF BIRTH:  1981-10-27  DATE OF ADMISSION:  07/28/2019  PRIMARY CARE PHYSICIAN: Jodi Marble, MD   REQUESTING/REFERRING PHYSICIAN: Dr. Lenise Arena  CHIEF COMPLAINT:   Chief Complaint  Patient presents with  . Shortness of Breath    HISTORY OF PRESENT ILLNESS:  Walter Pena  is a 38 y.o. male with a known history of schizophrenia, hypertension, history of gout, ongoing tobacco abuse who presents to the hospital complaining of shortness of breath.  Patient says he gets mostly short of breath on exertion.  He admits to a cough which is productive with yellow sputum.  He denies any fever, chills, nausea, vomiting.  He denies any recent sick contacts or travel history.  Patient's COVID-19 test is negative.  Patient does state he smokes about a pack a day has been smoking for the past 20+ years.  Patient says his shortness of breath was getting progressively worse with minimal exertion and therefore he came to the hospital for further evaluation.  Patient was noted to be in acute respiratory failure with hypoxia secondary to suspected COPD exacerbation/mild CHF and therefore hospitalist services were contacted for admission.  Patient ER received multiple duo nebs and IV steroids but continued to have significant wheezing and bronchospasm.  Pt. denies any paroxysmal nocturnal dyspnea or orthopnea.  PAST MEDICAL HISTORY:   Past Medical History:  Diagnosis Date  . Gout   . Hidradenitis   . Hypertension   . Schizophrenia ([place])     PAST SURGICAL HISTORY:   Past Surgical History:  Procedure Laterality Date  . sweat gland removal      SOCIAL HISTORY:   Social History   Tobacco Use  . Smoking status: Current Every Day Smoker    Packs/day: 1.00    Years: 25.00    Pack years: 25.00    Types: Cigarettes  . Smokeless tobacco: Never Used  Substance Use Topics  .  Alcohol use: Yes    Comment: Socially    FAMILY HISTORY:   Family History  Problem Relation Age of Onset  . Diabetes Mellitus II Mother   . Hypertension Mother   . Hypertension Father     DRUG ALLERGIES:   Allergies  Allergen Reactions  . Bactrim [Sulfamethoxazole-Trimethoprim] Nausea And Vomiting  . Lisinopril Swelling  . Other Rash    aloe    REVIEW OF SYSTEMS:   Review of Systems  Constitutional: Negative for fever and weight loss.  HENT: Negative for congestion, nosebleeds and tinnitus.   Eyes: Negative for blurred vision, double vision and redness.  Respiratory: Positive for cough, sputum production, shortness of breath and wheezing. Negative for hemoptysis.   Cardiovascular: Negative for chest pain, orthopnea, leg swelling and PND.  Gastrointestinal: Negative for abdominal pain, diarrhea, melena, nausea and vomiting.  Genitourinary: Negative for dysuria, hematuria and urgency.  Musculoskeletal: Negative for falls and joint pain.  Neurological: Negative for dizziness, tingling, sensory change, focal weakness, seizures, weakness and headaches.  Endo/Heme/Allergies: Negative for polydipsia. Does not bruise/bleed easily.  Psychiatric/Behavioral: Negative for depression and memory loss. The patient is not nervous/anxious.     MEDICATIONS AT HOME:   Prior to Admission medications   Medication Sig Start Date End Date Taking? Authorizing Provider  allopurinol (ZYLOPRIM) 100 MG tablet Take 100 mg by mouth daily. 01/21/17  Yes [provider]  amLODipine (NORVASC) 10 MG tablet Take 1  tablet (10 mg total) by mouth daily. 09/18/18  Yes Enid Derry, PA-C  metFORMIN (GLUCOPHAGE) 1000 MG tablet Take 1,000 mg by mouth 2 (two) times daily. 06/27/19  Yes [provider]  olmesartan (BENICAR) 20 MG tablet Take 20 mg by mouth daily. 06/27/19  Yes [provider]  paliperidone (INVEGA SUSTENNA) 156 MG/ML SUSP injection Inject 156 mg into the muscle once.   Yes  [provider]      VITAL SIGNS:  Blood pressure (!) 173/111, pulse (!) 105, temperature 98.9 F (37.2 C), temperature source Oral, resp. rate (!) 21, height 5\' 8"  (1.727 m), weight 124.7 kg, SpO2 98 %.  PHYSICAL EXAMINATION:  Physical Exam  GENERAL:  38 y.o.-year-old patient lying in the bed lethargic in mild Resp. Distress.  EYES: Pupils equal, round, reactive to light and accommodation. No scleral icterus. Extraocular muscles intact.  HEENT: Head atraumatic, normocephalic. Oropharynx and nasopharynx clear. No oropharyngeal erythema, moist oral mucosa  NECK:  Supple, no jugular venous distention. No thyroid enlargement, no tenderness.  LUNGS: Good air entry bilaterally, diffuse wheezing, rhonchi bilaterally.  Negative use of accessory muscles, no dullness to percussion. CARDIOVASCULAR: S1, S2 RRR. No murmurs, rubs, gallops, clicks.  ABDOMEN: Soft, nontender, nondistended. Bowel sounds present. No organomegaly or mass.  EXTREMITIES: No pedal edema, cyanosis, or clubbing. + 2 pedal & radial pulses b/l.   NEUROLOGIC: Cranial nerves II through XII are intact. No focal Motor or sensory deficits appreciated b/l PSYCHIATRIC: The patient is alert and oriented x 3.  SKIN: No obvious rash, lesion, or ulcer.   LABORATORY PANEL:   CBC Recent Labs  Lab 07/28/19 1127  WBC 15.4*  HGB 10.7*  HCT 33.2*  PLT 325   ------------------------------------------------------------------------------------------------------------------  Chemistries  Recent Labs  Lab 07/28/19 1127  NA 138  K 3.3*  CL 100  CO2 26  GLUCOSE 153*  BUN 9  CREATININE 0.94  CALCIUM 8.6*   ------------------------------------------------------------------------------------------------------------------  Cardiac Enzymes No results for input(s): TROPONINI in the last 168 hours. ------------------------------------------------------------------------------------------------------------------  RADIOLOGY:   Dg Chest Port 1 View  Result Date: 07/28/2019 CLINICAL DATA:  Shortness of breath and cough EXAM: PORTABLE CHEST 1 VIEW COMPARISON:  March 06, 2013 FINDINGS: There is no edema or consolidation. Heart is upper normal in size with pulmonary vascularity normal. No adenopathy. No bone lesions. IMPRESSION: No edema or consolidation.  Heart upper normal in size. Electronically Signed   By: Bretta Bang III M.D.   On: 07/28/2019 12:10     IMPRESSION AND PLAN:   38 year old male with past medical history of  schizophrenia, hypertension, history of gout, ongoing tobacco abuse who presents to the hospital complaining of shortness of breath.  1.  Acute respiratory failure with hypoxia- secondary to combination of COPD exacerbation also mild CHF. -Continue O2 supplementation, will treat underlying COPD with IV steroids, scheduled duo nebs, Pulmicort nebs. -We will treat CHF with IV Lasix.  Follow clinically and wean off oxygen as tolerated.  2.  COPD exacerbation- secondary to ongoing tobacco abuse.  Patient's chest x-ray is negative for pneumonia. -We will treat patient with IV steroids, scheduled duo nebs, Pulmicort nebs. -Assess the patient for home oxygen prior to discharge.  3.  CHF- new onset for the patient.  Unclear if this is systolic or diastolic, will get echocardiogram to rule out. -We will mildly diurese the patient with IV Lasix, follow I's and O's and daily weights.  4.  Essential hypertension-continue Norvasc, Avapro  5.  History of gout-no acute  attack.  Continue allopurinol.  6.  Diabetes type 2 without complication- continue metformin, will place on sliding scale insulin. -Check a hemoglobin A1c.  7.  History of schizophrenia- continue Invega.      All the records are reviewed and case discussed with ED provider. Management plans discussed with the patient, family and they are in agreement.  CODE STATUS: Full code  TOTAL TIME TAKING CARE OF THIS PATIENT: 45 minutes.     Houston SirenVivek J Ixel Boehning M.D on 07/28/2019 at 3:47 PM  Between 7am to 6pm - Pager - [phone]  After 6pm go to [email or web address] - password [email or web address] [facility]  [facility]  Office  [phone]  CC: Primary care physician; Sherron Mondayejan-Sie, S Ahmed, MD

## 2019-07-28 NOTE — ED Notes (Signed)
Pt st "if I feel short of breath I'll come back".

## 2019-07-28 NOTE — ED Notes (Signed)
Pt st "I want to leave now" Pt refuses to stay in bed with monitor on, pt sitting on side of bench at this time with NAD noted.

## 2019-07-28 NOTE — ED Notes (Signed)
Pt walking down the hallways stating :"I want to leave now". This Rn advised pt to sing AMA. Pt accepts to sing AMA at this time.

## 2019-07-28 NOTE — ED Notes (Signed)
Pt present with a Hx of HTN. Pt st not taking his BP mediation today.

## 2019-07-28 NOTE — ED Triage Notes (Signed)
Pt c/o having cough with congestino for the pat 2 days, states today he is not able to catch his breathe, pt has labored breathing on arrival at rest.

## 2019-07-29 ENCOUNTER — Other Ambulatory Visit: Payer: Self-pay

## 2019-07-29 ENCOUNTER — Emergency Department: Payer: Medicare Other

## 2019-07-29 ENCOUNTER — Inpatient Hospital Stay
Admission: EM | Admit: 2019-07-29 | Discharge: 2019-07-30 | DRG: 190 | Disposition: A | Discharge status: Home / Self Care | Payer: Medicare Other | Attending: Internal Medicine | Admitting: Internal Medicine

## 2019-07-29 DIAGNOSIS — E669 Obesity, unspecified: Secondary | ICD-10-CM | POA: Diagnosis not present

## 2019-07-29 DIAGNOSIS — I10 Essential (primary) hypertension: Secondary | ICD-10-CM | POA: Diagnosis not present

## 2019-07-29 DIAGNOSIS — Z79899 Other long term (current) drug therapy: Secondary | ICD-10-CM | POA: Diagnosis not present

## 2019-07-29 DIAGNOSIS — M109 Gout, unspecified: Secondary | ICD-10-CM | POA: Diagnosis not present

## 2019-07-29 DIAGNOSIS — J9601 Acute respiratory failure with hypoxia: Secondary | ICD-10-CM | POA: Diagnosis not present

## 2019-07-29 DIAGNOSIS — Z20828 Contact with and (suspected) exposure to other viral communicable diseases: Secondary | ICD-10-CM | POA: Diagnosis not present

## 2019-07-29 DIAGNOSIS — F209 Schizophrenia, unspecified: Secondary | ICD-10-CM | POA: Diagnosis not present

## 2019-07-29 DIAGNOSIS — J441 Chronic obstructive pulmonary disease with (acute) exacerbation: Secondary | ICD-10-CM

## 2019-07-29 DIAGNOSIS — Z7984 Long term (current) use of oral hypoglycemic drugs: Secondary | ICD-10-CM

## 2019-07-29 DIAGNOSIS — E119 Type 2 diabetes mellitus without complications: Secondary | ICD-10-CM | POA: Diagnosis present

## 2019-07-29 DIAGNOSIS — F17210 Nicotine dependence, cigarettes, uncomplicated: Secondary | ICD-10-CM | POA: Diagnosis present

## 2019-07-29 DIAGNOSIS — Z833 Family history of diabetes mellitus: Secondary | ICD-10-CM

## 2019-07-29 DIAGNOSIS — Z6841 Body Mass Index (BMI) 40.0 and over, adult: Secondary | ICD-10-CM

## 2019-07-29 LAB — CBC WITH DIFFERENTIAL/PLATELET
Abs Immature Granulocytes: 0.06 10*3/uL (ref 0.00–0.07)
Basophils Absolute: 0 10*3/uL (ref 0.0–0.1)
Basophils Relative: 0 %
Eosinophils Absolute: 0 10*3/uL (ref 0.0–0.5)
Eosinophils Relative: 0 %
HCT: 33.2 % — ABNORMAL LOW (ref 39.0–52.0)
Hemoglobin: 10.9 g/dL — ABNORMAL LOW (ref 13.0–17.0)
Immature Granulocytes: 0 %
Lymphocytes Relative: 21 %
Lymphs Abs: 2.8 10*3/uL (ref 0.7–4.0)
MCH: 27 pg (ref 26.0–34.0)
MCHC: 32.8 g/dL (ref 30.0–36.0)
MCV: 82.4 fL (ref 80.0–100.0)
Monocytes Absolute: 0.8 10*3/uL (ref 0.1–1.0)
Monocytes Relative: 6 %
Neutro Abs: 9.9 10*3/uL — ABNORMAL HIGH (ref 1.7–7.7)
Neutrophils Relative %: 73 %
Platelets: 377 10*3/uL (ref 150–400)
RBC: 4.03 MIL/uL — ABNORMAL LOW (ref 4.22–5.81)
RDW: 17.7 % — ABNORMAL HIGH (ref 11.5–15.5)
WBC: 13.5 10*3/uL — ABNORMAL HIGH (ref 4.0–10.5)
nRBC: 0 % (ref 0.0–0.2)

## 2019-07-29 LAB — COMPREHENSIVE METABOLIC PANEL
ALT: 36 U/L (ref 0–44)
AST: 42 U/L — ABNORMAL HIGH (ref 15–41)
Albumin: 3.3 g/dL — ABNORMAL LOW (ref 3.5–5.0)
Alkaline Phosphatase: 69 U/L (ref 38–126)
Anion gap: 11 (ref 5–15)
BUN: 19 mg/dL (ref 6–20)
CO2: 27 mmol/L (ref 22–32)
Calcium: 9 mg/dL (ref 8.9–10.3)
Chloride: 98 mmol/L (ref 98–111)
Creatinine, Ser: 1.04 mg/dL (ref 0.61–1.24)
GFR calc Af Amer: >60 mL/min (ref >60)
GFR calc non Af Amer: >60 mL/min (ref >60)
Glucose, Bld: 198 mg/dL — ABNORMAL HIGH (ref 70–99)
Potassium: 3 mmol/L — ABNORMAL LOW (ref 3.5–5.1)
Sodium: 136 mmol/L (ref 135–145)
Total Bilirubin: 0.6 mg/dL (ref 0.3–1.2)
Total Protein: 9.8 g/dL — ABNORMAL HIGH (ref 6.5–8.1)

## 2019-07-29 LAB — BLOOD GAS, VENOUS
Acid-Base Excess: 7.1 mmol/L — ABNORMAL HIGH (ref 0.0–2.0)
Acid-Base Excess: 12.1 mmol/L — ABNORMAL HIGH (ref 0.0–2.0)
Bicarbonate: 33.9 mmol/L — ABNORMAL HIGH (ref 20.0–28.0)
Bicarbonate: 36.7 mmol/L — ABNORMAL HIGH (ref 20.0–28.0)
O2 Saturation: 58.8 %
Patient temperature: 37
Patient temperature: 37
pCO2, Ven: 56 mmHg (ref 44.0–60.0)
pCO2, Ven: 46 mmHg (ref 44.0–60.0)
pH, Ven: 7.39 (ref 7.250–7.430)
pH, Ven: 7.51 — ABNORMAL HIGH (ref 7.250–7.430)
pO2, Ven: 40 mmHg (ref 32.0–45.0)

## 2019-07-29 LAB — GLUCOSE, CAPILLARY
Glucose-Capillary: 225 mg/dL — ABNORMAL HIGH (ref 70–99)
Glucose-Capillary: 286 mg/dL — ABNORMAL HIGH (ref 70–99)

## 2019-07-29 LAB — SARS CORONAVIRUS 2 BY RT PCR (HOSPITAL ORDER, PERFORMED IN ~~LOC~~ HOSPITAL LAB): SARS Coronavirus 2: NEGATIVE

## 2019-07-29 LAB — BRAIN NATRIURETIC PEPTIDE: B Natriuretic Peptide: 122 pg/mL — ABNORMAL HIGH (ref 0.0–100.0)

## 2019-07-29 MED ORDER — IPRATROPIUM BROMIDE 0.02 % IN SOLN
1.0000 mg | Freq: Once | RESPIRATORY_TRACT | Status: AC
  Administered 2019-07-29: 14:00:00 1 mg via RESPIRATORY_TRACT
  Filled 2019-07-29: qty 5

## 2019-07-29 MED ORDER — ALBUTEROL SULFATE (2.5 MG/3ML) 0.083% IN NEBU
10.0000 mg/h | INHALATION_SOLUTION | Freq: Once | RESPIRATORY_TRACT | Status: AC
  Administered 2019-07-29: 14:00:00 10 mg/h via RESPIRATORY_TRACT
  Filled 2019-07-29: qty 3

## 2019-07-29 MED ORDER — ONDANSETRON HCL 4 MG PO TABS: 4.0000 mg | ORAL_TABLET | Freq: Four times a day (QID) | ORAL | Status: DC | PRN

## 2019-07-29 MED ORDER — ONDANSETRON HCL 4 MG/2ML IJ SOLN: 4.0000 mg | Freq: Four times a day (QID) | INTRAMUSCULAR | Status: DC | PRN

## 2019-07-29 MED ORDER — METFORMIN HCL 500 MG PO TABS
1000.0000 mg | ORAL_TABLET | Freq: Two times a day (BID) | ORAL | Status: DC
  Administered 2019-07-30: 1000 mg via ORAL
  Filled 2019-07-29: qty 2

## 2019-07-29 MED ORDER — IPRATROPIUM-ALBUTEROL 0.5-2.5 (3) MG/3ML IN SOLN
3.0000 mL | Freq: Four times a day (QID) | RESPIRATORY_TRACT | Status: DC
  Administered 2019-07-29 – 2019-07-30 (×4): 3 mL via RESPIRATORY_TRACT
  Filled 2019-07-29 (×4): qty 3

## 2019-07-29 MED ORDER — ENOXAPARIN SODIUM 40 MG/0.4ML ~~LOC~~ SOLN
40.0000 mg | Freq: Two times a day (BID) | SUBCUTANEOUS | Status: DC
  Administered 2019-07-29 – 2019-07-30 (×2): 40 mg via SUBCUTANEOUS
  Filled 2019-07-29 (×2): qty 0.4

## 2019-07-29 MED ORDER — METHYLPREDNISOLONE SODIUM SUCC 125 MG IJ SOLR
125.0000 mg | Freq: Once | INTRAMUSCULAR | Status: AC
  Administered 2019-07-29: 14:00:00 125 mg via INTRAVENOUS
  Filled 2019-07-29: qty 2

## 2019-07-29 MED ORDER — ACETAMINOPHEN 650 MG RE SUPP: 650.0000 mg | Freq: Four times a day (QID) | RECTAL | Status: DC | PRN

## 2019-07-29 MED ORDER — INSULIN ASPART 100 UNIT/ML ~~LOC~~ SOLN
0.0000 [IU] | Freq: Every day | SUBCUTANEOUS | Status: DC
  Administered 2019-07-29: 3 [IU] via SUBCUTANEOUS
  Filled 2019-07-29: qty 1

## 2019-07-29 MED ORDER — INSULIN ASPART 100 UNIT/ML ~~LOC~~ SOLN
0.0000 [IU] | Freq: Three times a day (TID) | SUBCUTANEOUS | Status: DC
  Administered 2019-07-30: 2 [IU] via SUBCUTANEOUS
  Administered 2019-07-30: 3 [IU] via SUBCUTANEOUS
  Filled 2019-07-29 (×2): qty 1

## 2019-07-29 MED ORDER — ALLOPURINOL 100 MG PO TABS
100.0000 mg | ORAL_TABLET | Freq: Every day | ORAL | Status: DC
  Administered 2019-07-30: 100 mg via ORAL
  Filled 2019-07-29 (×2): qty 1

## 2019-07-29 MED ORDER — IRBESARTAN 150 MG PO TABS
150.0000 mg | ORAL_TABLET | Freq: Every day | ORAL | Status: DC
  Administered 2019-07-29 – 2019-07-30 (×2): 150 mg via ORAL
  Filled 2019-07-29 (×2): qty 1

## 2019-07-29 MED ORDER — METHYLPREDNISOLONE SODIUM SUCC 40 MG IJ SOLR
40.0000 mg | Freq: Four times a day (QID) | INTRAMUSCULAR | Status: DC
  Administered 2019-07-29 – 2019-07-30 (×3): 40 mg via INTRAVENOUS
  Filled 2019-07-29 (×3): qty 1

## 2019-07-29 MED ORDER — BUDESONIDE 0.5 MG/2ML IN SUSP
0.5000 mg | Freq: Two times a day (BID) | RESPIRATORY_TRACT | Status: DC
  Administered 2019-07-29 – 2019-07-30 (×2): 0.5 mg via RESPIRATORY_TRACT
  Filled 2019-07-29 (×2): qty 2

## 2019-07-29 MED ORDER — ACETAMINOPHEN 325 MG PO TABS: 650.0000 mg | ORAL_TABLET | Freq: Four times a day (QID) | ORAL | Status: DC | PRN

## 2019-07-29 MED ORDER — AMLODIPINE BESYLATE 10 MG PO TABS
10.0000 mg | ORAL_TABLET | Freq: Every day | ORAL | Status: DC
  Administered 2019-07-29 – 2019-07-30 (×2): 10 mg via ORAL
  Filled 2019-07-29 (×2): qty 1

## 2019-07-29 MED ORDER — NICOTINE 21 MG/24HR TD PT24
21.0000 mg | MEDICATED_PATCH | Freq: Every day | TRANSDERMAL | Status: DC
  Administered 2019-07-29 – 2019-07-30 (×2): 21 mg via TRANSDERMAL
  Filled 2019-07-29 (×2): qty 1

## 2019-07-29 NOTE — ED Notes (Signed)
Attempted to call report, RN Janett Billow in another room at this time and will call back shortly.

## 2019-07-29 NOTE — ED Notes (Signed)
Attempted to call report, no answer at this time. Will call back again

## 2019-07-29 NOTE — ED Triage Notes (Signed)
Pt left AMA after admission orders yesterday, due to "taking too long". Pt states cough, wheezing and SOB. Pt wheezing upon triage.

## 2019-07-29 NOTE — ED Notes (Signed)
Pt to ER ambulatory, given wheelchair upon arrival. Pt c/o cough. Pt in NAD at this time. RR even and unlabored.

## 2019-07-29 NOTE — Progress Notes (Signed)
PHARMACIST - PHYSICIAN COMMUNICATION  CONCERNING:  Enoxaparin (Lovenox) for DVT Prophylaxis    RECOMMENDATION: Patient was prescribed enoxaparin 40mg  q24 hours for VTE prophylaxis.   Filed Weights   07/29/19 1232 07/29/19 1242  Weight: 275 lb (124.7 kg) 273 lb 5.9 oz (124 kg)    Body mass index is 41.57 kg/m.  Estimated Creatinine Clearance: 122.2 mL/min (by C-G formula based on SCr of 1.04 mg/dL).   Based on [place] patient is candidate for enoxaparin 40 mg every 12 hour dosing due to BMI being >40.   DESCRIPTION: Pharmacy has adjusted enoxaparin dose per [facility] policy.  Patient is now receiving enoxaparin 40 mg every 12 hours.    Tawnya Crook, PharmD Clinical Pharmacist  07/29/2019 5:34 PM

## 2019-07-29 NOTE — ED Notes (Signed)
Lab called and spoke with Walter Pena. She will add on blood.

## 2019-07-29 NOTE — ED Provider Notes (Signed)
Walter Pena Emergency Department Provider Note  ____________________________________________   First MD Initiated Contact with Patient 07/29/19 1308     (approximate)  I have reviewed the triage vital signs and the nursing notes.   HISTORY  Chief Complaint Congestive Heart Failure and Cough    HPI [name] is a 38 y.o. male  With h/o schizophrenia here with SOB. Pt reports SOB and wheezing x several days.  He was seen in the ED yesterday and admitted, but left AMA.  States he thinks new.  Since then, he states he is essentially been unable to even walk.  He has had severe, worsening, shortness of breath with wheezing.  Has had a dry cough.  Denies any fevers or chills.  He denies any significant leg swelling.  Is also given a dose of Lasix and states he has not been peeing significantly more than usual since he left.  No other complaints.  He is now amenable to admission.   Symptoms are worse with movement.  No alleviating factors.       Past Medical History:  Diagnosis Date  . Gout   . Hidradenitis   . Hypertension   . Schizophrenia [facility](HCC)     Patient Active Problem List   Diagnosis Date Noted  . Acute respiratory failure with hypoxia (HCC) 07/28/2019  . 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 Diabetes (HCC) 02/18/2017  . Schizophrenia (HCC) 02/17/2017  . Alcohol use disorder, moderate, dependence (HCC) 02/17/2017  . Noncompliance 05/07/2016  . HTN (hypertension) 01/21/2016  . Cannabis use disorder, moderate, dependence (HCC) 01/21/2016  . Tobacco use disorder 01/21/2016  . Gout 01/21/2016  . Hidradenitis 01/21/2016    Past Surgical History:  Procedure Laterality Date  . sweat gland removal      Prior to Admission medications   Medication Sig Start Date End Date Taking? Authorizing Provider  allopurinol (ZYLOPRIM) 100 MG tablet Take 100 mg by mouth daily. 01/21/17  Yes [provider]  amLODipine  (NORVASC) 10 MG tablet Take 1 tablet (10 mg total) by mouth daily. 09/18/18  Yes Enid DerryWagner, Ashley, PA-C  metFORMIN (GLUCOPHAGE) 1000 MG tablet Take 1,000 mg by mouth 2 (two) times daily. 06/27/19  Yes [provider]  olmesartan (BENICAR) 20 MG tablet Take 20 mg by mouth daily. 06/27/19  Yes [provider]  paliperidone (INVEGA SUSTENNA) 156 MG/ML SUSP injection Inject 156 mg into the muscle once.   Yes [provider]    Allergies Bactrim [sulfamethoxazole-trimethoprim], Lisinopril, and Other  Family History  Problem Relation Age of Onset  . Diabetes Mellitus II Mother   . Hypertension Mother   . Hypertension Father     Social History Social History   Tobacco Use  . Smoking status: Current Every Day Smoker    Packs/day: 1.00    Years: 25.00    Pack years: 25.00    Types: Cigarettes  . Smokeless tobacco: Never Used  Substance Use Topics  . Alcohol use: Yes    Comment: Socially  . Drug use: Yes    Types: Marijuana    Review of Systems  Review of Systems  Constitutional: Positive for fatigue. Negative for chills and fever.  HENT: Negative for sore throat.   Respiratory: Positive for cough, shortness of breath and wheezing.   Cardiovascular: Negative for chest pain.  Gastrointestinal: Negative for abdominal pain.  Genitourinary: Negative for flank pain.  Musculoskeletal: Negative for neck pain.  Skin: Negative for rash and wound.  Allergic/Immunologic:  Negative for immunocompromised state.  Neurological: Negative for weakness and numbness.  Hematological: Does not bruise/bleed easily.  All other systems reviewed and are negative.    ____________________________________________  PHYSICAL EXAM:      VITAL SIGNS: ED Triage Vitals  Enc Vitals Group     BP 07/29/19 1233 (!) 159/95     Pulse Rate 07/29/19 1233 (!) 105     Resp 07/29/19 1233 20     Temp 07/29/19 1233 98.6 F (37 C)     Temp Source 07/29/19 1233 Oral     SpO2 07/29/19 1233 95  %     Weight 07/29/19 1232 275 lb (124.7 kg)     Height 07/29/19 1232 5\' 8"  (1.727 m)     Head Circumference --      Peak Flow --      Pain Score 07/29/19 1241 0     Pain Loc --      Pain Edu? --      Excl. in GC? --      Physical Exam Vitals signs and nursing note reviewed.  Constitutional:      General: He is not in acute distress.    Appearance: He is well-developed.  HENT:     Head: Normocephalic and atraumatic.  Eyes:     Conjunctiva/sclera: Conjunctivae normal.  Neck:     Musculoskeletal: Neck supple.  Cardiovascular:     Rate and Rhythm: Regular rhythm. Tachycardia present.     Heart sounds: Normal heart sounds. No murmur. No friction rub.  Pulmonary:     Effort: Tachypnea, accessory muscle usage and respiratory distress present.     Breath sounds: Examination of the right-upper field reveals wheezing. Examination of the left-upper field reveals wheezing. Examination of the right-middle field reveals wheezing. Examination of the left-middle field reveals wheezing. Examination of the right-lower field reveals wheezing. Examination of the left-lower field reveals wheezing. Decreased breath sounds and wheezing present. No rales.  Abdominal:     General: There is no distension.     Palpations: Abdomen is soft.     Tenderness: There is no abdominal tenderness.  Musculoskeletal:     Right lower leg: Edema (Trace) present.     Left lower leg: Edema (Trace) present.  Skin:    General: Skin is warm.     Capillary Refill: Capillary refill takes less than 2 seconds.  Neurological:     Mental Status: He is alert and oriented to person, place, and time.     Motor: No abnormal muscle tone.       ____________________________________________   LABS (all labs ordered are listed, but only abnormal results are displayed)  Labs Reviewed  CBC WITH DIFFERENTIAL/PLATELET - Abnormal; Notable for the following components:      Result Value   WBC 13.5 (*)    RBC 4.03 (*)     Hemoglobin 10.9 (*)    HCT 33.2 (*)    RDW 17.7 (*)    Neutro Abs 9.9 (*)    All other components within normal limits  COMPREHENSIVE METABOLIC PANEL - Abnormal; Notable for the following components:   Potassium 3.0 (*)    Glucose, Bld 198 (*)    Total Protein 9.8 (*)    Albumin 3.3 (*)    AST 42 (*)    All other components within normal limits  BRAIN NATRIURETIC PEPTIDE - Abnormal; Notable for the following components:   B Natriuretic Peptide 122.0 (*)    All other components within normal limits  BLOOD GAS, VENOUS - Abnormal; Notable for the following components:   pH, Ven 7.51 (*)    Bicarbonate 36.7 (*)    Acid-Base Excess 12.1 (*)    All other components within normal limits  GLUCOSE, CAPILLARY - Abnormal; Notable for the following components:   Glucose-Capillary 225 (*)    All other components within normal limits  SARS CORONAVIRUS 2 (HOSPITAL ORDER, [place] [facility])  HIV ANTIBODY (ROUTINE TESTING W REFLEX)  BASIC METABOLIC PANEL  CBC  HEMOGLOBIN A1C    ____________________________________________  EKG: Normal sinus rhythm, ventricular rate 99.  Nonspecific ST and T-segment changes, not significantly changed from prior.  No acute ST elevations or depressions. ________________________________________  RADIOLOGY All imaging, including plain films, CT scans, and ultrasounds, independently reviewed by me, and interpretations confirmed via formal radiology reads.  ED MD interpretation:   CXR: No active disease  Official radiology report(s): Dg Chest Portable 1 View  Result Date: 07/29/2019 CLINICAL DATA:  Shortness of breath.  Cough and wheezing. EXAM: PORTABLE CHEST 1 VIEW COMPARISON:  07/28/2019 FINDINGS: The heart size and mediastinal contours are within normal limits. Both lungs are clear. The visualized skeletal structures are unremarkable. IMPRESSION: No active disease. Electronically Signed   By: Lorriane Shire M.D.   On: 07/29/2019 14:25     ____________________________________________  PROCEDURES   Procedure(s) performed (including Critical Care):  .Critical Care Performed by: Duffy Bruce, MD Authorized by: Henreitta Leber, MD   Critical care provider statement:    Critical care time (minutes):  35   Critical care time was exclusive of:  Separately billable procedures and treating other patients and teaching time   Critical care was necessary to treat or prevent imminent or life-threatening deterioration of the following conditions:  Circulatory failure, cardiac failure and respiratory failure   Critical care was time spent personally by me on the following activities:  Development of treatment plan with patient or surrogate, discussions with consultants, evaluation of patient's response to treatment, examination of patient, obtaining history from patient or surrogate, ordering and performing treatments and interventions, ordering and review of laboratory studies, ordering and review of radiographic studies, pulse oximetry, re-evaluation of patient's condition and review of old charts   I assumed direction of critical care for this patient from another provider in my specialty: no      ____________________________________________  INITIAL IMPRESSION / MDM / [place] / ED COURSE  As part of my medical decision making, I reviewed the following data within the electronic MEDICAL RECORD NUMBER Notes from prior ED visits and [place] Controlled Substance Database      *Sohil Timko. was evaluated in Emergency Department on 07/29/2019 for the symptoms described in the history of present illness. He was evaluated in the context of the global COVID-19 pandemic, which necessitated consideration that the patient might be at risk for infection with the SARS-CoV-2 virus that causes COVID-19. Institutional protocols and algorithms that pertain to the evaluation of patients at risk for COVID-19 are in a state of rapid  change based on information released by regulatory bodies including the CDC and federal and state organizations. These policies and algorithms were followed during the patient's care in the ED.  Some ED evaluations and interventions may be delayed as a result of limited staffing during the pandemic.*      Medical Decision Making: 38 yo M here with SOB, cough, wheezing. Exam is c/w ongoing COPD exacerbation. Possible component of CHF as well. COVID  negative (re-checked as pt had been out of hospital, unknown whether he was exposed). Admit to medicine. IV Solumedrol, CAT given 2/2 increased WOB with improvement. CXR w/o focal PNA. VBG is reassuring, no signs of retention - will hold on BIPAP. No sputum production, no focal findings to suggest PNA or bacterial superinfection.  Pt breathing improved after continuous neb, steroids. Admit.  ____________________________________________  FINAL CLINICAL IMPRESSION(S) / ED DIAGNOSES  Final diagnoses:  COPD exacerbation (HCC)     MEDICATIONS GIVEN DURING THIS VISIT:  Medications  allopurinol (ZYLOPRIM) tablet 100 mg (has no administration in time range)  amLODipine (NORVASC) tablet 10 mg (10 mg Oral Given 07/29/19 1753)  irbesartan (AVAPRO) tablet 150 mg (150 mg Oral Given 07/29/19 1753)  metFORMIN (GLUCOPHAGE) tablet 1,000 mg (has no administration in time range)  acetaminophen (TYLENOL) tablet 650 mg (has no administration in time range)    Or  acetaminophen (TYLENOL) suppository 650 mg (has no administration in time range)  ondansetron (ZOFRAN) tablet 4 mg (has no administration in time range)    Or  ondansetron (ZOFRAN) injection 4 mg (has no administration in time range)  enoxaparin (LOVENOX) injection 40 mg (has no administration in time range)  methylPREDNISolone sodium succinate (SOLU-MEDROL) 40 mg/mL injection 40 mg (has no administration in time range)  ipratropium-albuterol (DUONEB) 0.5-2.5 (3) MG/3ML nebulizer solution 3 mL (has no  administration in time range)  budesonide (PULMICORT) nebulizer solution 0.5 mg (has no administration in time range)  nicotine (NICODERM CQ - dosed in mg/24 hours) patch 21 mg (21 mg Transdermal Patch Applied 07/29/19 1753)  insulin aspart (novoLOG) injection 0-9 Units (has no administration in time range)  insulin aspart (novoLOG) injection 0-5 Units (has no administration in time range)  methylPREDNISolone sodium succinate (SOLU-MEDROL) 125 mg/2 mL injection 125 mg (125 mg Intravenous Given 07/29/19 1347)  albuterol (PROVENTIL) (2.5 MG/3ML) 0.083% nebulizer solution (10 mg/hr Nebulization Given 07/29/19 1349)  ipratropium (ATROVENT) nebulizer solution 1 mg (1 mg Nebulization Given 07/29/19 1348)     ED Discharge Orders    None       Note:  This document was prepared using Dragon voice recognition software and may include unintentional dictation errors.   Shaune Pollack, MD 07/29/19 [phone]

## 2019-07-29 NOTE — ED Notes (Signed)
ED TO INPATIENT HANDOFF REPORT  ED Nurse Name and Phone #: SAm  S Name/Age/Gender Walter Pena. 38 y.o. male Room/Bed: ED24A/ED24A  Code Status   Code Status: Prior  Home/SNF/Other Home Patient oriented to: self, place, time and situation Is this baseline? Yes   Triage Complete: Triage complete  Chief Complaint Cough/DX CHF  Triage Note Pt left AMA after admission orders yesterday, due to "taking too long". Pt states cough, wheezing and SOB. Pt wheezing upon triage.    Allergies Allergies  Allergen Reactions  . Bactrim [Sulfamethoxazole-Trimethoprim] Nausea And Vomiting  . Lisinopril Swelling  . Other Rash    aloe    Level of Care/Admitting Diagnosis ED Disposition    ED Disposition Condition Comment   Admit  Hospital Area: [facility] [[id number]]  Level of Care: Med-Surg [16]  Covid Evaluation: Asymptomatic Screening Protocol (No Symptoms)  Diagnosis: Acute respiratory failure with hypoxia [facility]) [[id number]]  Admitting Physician: Houston Siren [[id number]]  Attending Physician: Houston Siren [[id number]]  Estimated length of stay: past midnight tomorrow  Certification:: I certify this patient will need inpatient services for at least 2 midnights  PT Class (Do Not Modify): Inpatient [101]  PT Acc Code (Do Not Modify): Private [1]       B Medical/Surgery History Past Medical History:  Diagnosis Date  . Gout   . Hidradenitis   . Hypertension   . Schizophrenia [facility])    Past Surgical History:  Procedure Laterality Date  . sweat gland removal       A IV Location/Drains/Wounds Patient Lines/Drains/Airways Status   Active Line/Drains/Airways    Name:   Placement date:   Placement time:   Site:   Days:   Peripheral IV 07/28/19 Right Antecubital   07/28/19    1119    Antecubital   1          Intake/Output Last 24 hours No intake or output data in the 24 hours ending 07/29/19 1616  Labs/Imaging Results for orders placed or  performed during the hospital encounter of 07/29/19 (from the past 48 hour(s))  CBC with Differential     Status: Abnormal   Collection Time: 07/29/19 12:45 PM  Result Value Ref Range   WBC 13.5 (H) 4.0 - 10.5 K/uL   RBC 4.03 (L) 4.22 - 5.81 MIL/uL   Hemoglobin 10.9 (L) 13.0 - 17.0 g/dL   HCT 02.4 (L) 09.7 - 35.3 %   MCV 82.4 80.0 - 100.0 fL   MCH 27.0 26.0 - 34.0 pg   MCHC 32.8 30.0 - 36.0 g/dL   RDW 29.9 (H) 24.2 - 68.3 %   Platelets 377 150 - 400 K/uL   nRBC 0.0 0.0 - 0.2 %   Neutrophils Relative % 73 %   Neutro Abs 9.9 (H) 1.7 - 7.7 K/uL   Lymphocytes Relative 21 %   Lymphs Abs 2.8 0.7 - 4.0 K/uL   Monocytes Relative 6 %   Monocytes Absolute 0.8 0.1 - 1.0 K/uL   Eosinophils Relative 0 %   Eosinophils Absolute 0.0 0.0 - 0.5 K/uL   Basophils Relative 0 %   Basophils Absolute 0.0 0.0 - 0.1 K/uL   Immature Granulocytes 0 %   Abs Immature Granulocytes 0.06 0.00 - 0.07 K/uL    Comment: Performed at [facility], [address] [place], [place] [address]  Comprehensive metabolic panel     Status: Abnormal   Collection Time: 07/29/19 12:45 PM  Result Value Ref Range  Sodium 136 135 - 145 mmol/L   Potassium 3.0 (L) 3.5 - 5.1 mmol/L   Chloride 98 98 - 111 mmol/L   CO2 27 22 - 32 mmol/L   Glucose, Bld 198 (H) 70 - 99 mg/dL   BUN 19 6 - 20 mg/dL   Creatinine, Ser 1.04 0.61 - 1.24 mg/dL   Calcium 9.0 8.9 - 10.3 mg/dL   Total Protein 9.8 (H) 6.5 - 8.1 g/dL   Albumin 3.3 (L) 3.5 - 5.0 g/dL   AST 42 (H) 15 - 41 U/L   ALT 36 0 - 44 U/L   Alkaline Phosphatase 69 38 - 126 U/L   Total Bilirubin 0.6 0.3 - 1.2 mg/dL   GFR calc non Af Amer >60 >60 mL/min   GFR calc Af Amer >60 >60 mL/min   Anion gap 11 5 - 15    Comment: Performed at [facility], [address] [place], [place] [address]  Brain natriuretic peptide     Status: Abnormal   Collection Time: 07/29/19  1:37 PM  Result Value Ref Range   B Natriuretic Peptide 122.0 (H) 0.0 - 100.0 pg/mL    Comment:  Performed at [facility], [address] [place], [place] [address]  Blood gas, venous     Status: Abnormal   Collection Time: 07/29/19  1:51 PM  Result Value Ref Range   pH, Ven 7.51 (H) 7.250 - 7.430   pCO2, Ven 46 44.0 - 60.0 mmHg   pO2, Ven 40.0 32.0 - 45.0 mmHg   Bicarbonate 36.7 (H) 20.0 - 28.0 mmol/L   Acid-Base Excess 12.1 (H) 0.0 - 2.0 mmol/L   Patient temperature 37.0    Collection site VENOUS    Sample type VENOUS     Comment: Performed at [facility], [address] [place],  [address]   Dg Chest Portable 1 View  Result Date: 07/29/2019 CLINICAL DATA:  Shortness of breath.  Cough and wheezing. EXAM: PORTABLE CHEST 1 VIEW COMPARISON:  07/28/2019 FINDINGS: The heart size and mediastinal contours are within normal limits. Both lungs are clear. The visualized skeletal structures are unremarkable. IMPRESSION: No active disease. Electronically Signed   By: Lorriane Shire M.D.   On: 07/29/2019 14:25   Dg Chest Port 1 View  Result Date: 07/28/2019 CLINICAL DATA:  Shortness of breath and cough EXAM: PORTABLE CHEST 1 VIEW COMPARISON:  March 06, 2013 FINDINGS: There is no edema or consolidation. Heart is upper normal in size with pulmonary vascularity normal. No adenopathy. No bone lesions. IMPRESSION: No edema or consolidation.  Heart upper normal in size. Electronically Signed   By: Lowella Grip III M.D.   On: 07/28/2019 12:10    Pending Labs Unresulted Labs (From admission, onward)    Start     Ordered   07/29/19 1447  SARS Coronavirus 2 [facility] order, Performed in [facility] hospital lab) Nasopharyngeal Nasopharyngeal Swab  (Symptomatic/High Risk of Exposure/Tier 1 Patients Labs with Precautions)  Once,   STAT    Question Answer Comment  Is this test for diagnosis or screening Diagnosis of ill patient   Symptomatic for COVID-19 as defined by CDC Yes   Date of Symptom Onset 07/29/2019   Hospitalized for COVID-19 Yes   Admitted to ICU for  COVID-19 No   Previously tested for COVID-19 Yes   Resident in a congregate (group) care setting No   Employed in healthcare setting No      07/29/19 1446   Signed and  Held  HIV antibody (Routine Testing)  Once,   R     Signed and Held   Signed and Held  Basic metabolic panel  Tomorrow morning,   R     Signed and Held   Signed and Held  CBC  Tomorrow morning,   R     Signed and Held   Signed and Held  CBC  (enoxaparin (LOVENOX)    CrCl >/= 30 ml/min)  Once,   R    Comments: Baseline for enoxaparin therapy IF NOT ALREADY DRAWN.  Notify MD if PLT < 100 K.    Signed and Held   Signed and Held  Creatinine, serum  (enoxaparin (LOVENOX)    CrCl >/= 30 ml/min)  Once,   R    Comments: Baseline for enoxaparin therapy IF NOT ALREADY DRAWN.    Signed and Held   Signed and Held  Creatinine, serum  (enoxaparin (LOVENOX)    CrCl >/= 30 ml/min)  Weekly,   R    Comments: while on enoxaparin therapy    Signed and Held   Signed and Held  Hemoglobin A1c  Add-on,   R     Signed and Held          Vitals/Pain Today's Vitals   07/29/19 1330 07/29/19 1400 07/29/19 1605 07/29/19 1606  BP: 124/88 (!) 159/119 (!) 150/98   Pulse: 95 92  96  Resp: 18 (!) 24 19 19   Temp:      TempSrc:      SpO2: 93% 94%  95%  Weight:      Height:      PainSc:        Isolation Precautions No active isolations  Medications Medications  methylPREDNISolone sodium succinate (SOLU-MEDROL) 125 mg/2 mL injection 125 mg (125 mg Intravenous Given 07/29/19 1347)  albuterol (PROVENTIL) (2.5 MG/3ML) 0.083% nebulizer solution (10 mg/hr Nebulization Given 07/29/19 1349)  ipratropium (ATROVENT) nebulizer solution 1 mg (1 mg Nebulization Given 07/29/19 1348)    Mobility walks Low fall risk   Focused Assessments Pulmonary Assessment Handoff:  Lung sounds: Bilateral Breath Sounds: Expiratory wheezes, Inspiratory wheezes L Breath Sounds: Expiratory wheezes, Inspiratory wheezes R Breath Sounds: Expiratory wheezes,  Inspiratory wheezes O2 Device: Room Air        R Recommendations: See Admitting Provider Note  Report given to:   Additional Notes:

## 2019-07-29 NOTE — ED Notes (Signed)
Called 2A and informed Lavetta Nielsen that pt was on his way up.

## 2019-07-29 NOTE — H&P (Signed)
[facility] [place] [facility]    PATIENT NAME: Walter Pena    MR#:  [id number]  DATE OF BIRTH:  10-04-1981  DATE OF ADMISSION:  07/29/2019  PRIMARY CARE PHYSICIAN: Sherron Mondayejan-Sie, S Ahmed, MD   REQUESTING/REFERRING PHYSICIAN: Dr. Reinaldo Meekerameron Issacs.   CHIEF COMPLAINT:   Chief Complaint  Patient presents with  . Congestive Heart Failure  . Cough    HISTORY OF PRESENT ILLNESS:  Walter DollyCharles Shimer  is a 38 y.o. male with a known history of of schizophrenia, hypertension, history of gout, ongoing tobacco abuse who presents to the hospital complaining of shortness of breath.  Patient says he gets mostly short of breath on exertion.  He admits to a cough which is productive with yellow sputum.  Patient was going to be admitted to the hospital yesterday for his worsening hypoxic respiratory failure but left AGAINST MEDICAL ADVICE and now returns back with similar symptoms as above.  He attempted to take some Robitussin at home and did not feel any better.  He continues to have significant wheezing, bronchospasm and has significant exertional dyspnea and therefore came to the ER for further evaluation.  Patient was noted to be in COPD exacerbation and therefore hospitalist services were contacted for admission.  Patient has agreed to stay in the hospital this time.  PAST MEDICAL HISTORY:   Past Medical History:  Diagnosis Date  . Gout   . Hidradenitis   . Hypertension   . Schizophrenia (HCC)     PAST SURGICAL HISTORY:   Past Surgical History:  Procedure Laterality Date  . sweat gland removal      SOCIAL HISTORY:   Social History   Tobacco Use  . Smoking status: Current Every Day Smoker    Packs/day: 1.00    Years: 25.00    Pack years: 25.00    Types: Cigarettes  . Smokeless tobacco: Never Used  Substance Use Topics  . Alcohol use: Yes    Comment: Socially    FAMILY HISTORY:   Family History  Problem Relation Age of Onset  . Diabetes Mellitus II Mother    . Hypertension Mother   . Hypertension Father     DRUG ALLERGIES:   Allergies  Allergen Reactions  . Bactrim [Sulfamethoxazole-Trimethoprim] Nausea And Vomiting  . Lisinopril Swelling  . Other Rash    aloe    REVIEW OF SYSTEMS:   Review of Systems  Constitutional: Negative for fever and weight loss.  HENT: Negative for congestion, nosebleeds and tinnitus.   Eyes: Negative for blurred vision, double vision and redness.  Respiratory: Positive for cough, shortness of breath and wheezing. Negative for hemoptysis.   Cardiovascular: Negative for chest pain, orthopnea, leg swelling and PND.  Gastrointestinal: Negative for abdominal pain, diarrhea, melena, nausea and vomiting.  Genitourinary: Negative for dysuria, hematuria and urgency.  Musculoskeletal: Negative for falls and joint pain.  Neurological: Negative for dizziness, tingling, sensory change, focal weakness, seizures, weakness and headaches.  Endo/Heme/Allergies: Negative for polydipsia. Does not bruise/bleed easily.  Psychiatric/Behavioral: Negative for depression and memory loss. The patient is not nervous/anxious.     MEDICATIONS AT HOME:   Prior to Admission medications   Medication Sig Start Date End Date Taking? Authorizing Provider  allopurinol (ZYLOPRIM) 100 MG tablet Take 100 mg by mouth daily. 01/21/17  Yes [provider]  amLODipine (NORVASC) 10 MG tablet Take 1 tablet (10 mg total) by mouth daily. 09/18/18  Yes Enid DerryWagner, Ashley, PA-C  metFORMIN (GLUCOPHAGE) 1000 MG tablet Take 1,000  mg by mouth 2 (two) times daily. 06/27/19  Yes [provider]  olmesartan (BENICAR) 20 MG tablet Take 20 mg by mouth daily. 06/27/19  Yes [provider]  paliperidone (INVEGA SUSTENNA) 156 MG/ML SUSP injection Inject 156 mg into the muscle once.   Yes [provider]      VITAL SIGNS:  Blood pressure (!) 150/98, pulse 96, temperature 98.6 F (37 C), temperature source Oral, resp. rate 19, height 5'  8" (1.727 m), weight 124 kg, SpO2 95 %.  PHYSICAL EXAMINATION:  Physical Exam  GENERAL:  38 y.o.-year-old patient lying in the bed in mild Resp. Distress.   EYES: Pupils equal, round, reactive to light and accommodation. No scleral icterus. Extraocular muscles intact.  HEENT: Head atraumatic, normocephalic. Oropharynx and nasopharynx clear. No oropharyngeal erythema, moist oral mucosa  NECK:  Supple, no jugular venous distention. No thyroid enlargement, no tenderness.  LUNGS: Good air entry bilaterally, diffuse wheezing bilaterally, diffuse rhonchi bilaterally.  No rales, negative use of accessory muscles. CARDIOVASCULAR: S1, S2 RRR. No murmurs, rubs, gallops, clicks.  ABDOMEN: Soft, nontender, nondistended. Bowel sounds present. No organomegaly or mass.  EXTREMITIES: No pedal edema, cyanosis, or clubbing. + 2 pedal & radial pulses b/l.   NEUROLOGIC: Cranial nerves II through XII are intact. No focal Motor or sensory deficits appreciated b/l PSYCHIATRIC: The patient is alert and oriented x 3.  SKIN: No obvious rash, lesion, or ulcer.   LABORATORY PANEL:   CBC Recent Labs  Lab 07/29/19 1245  WBC 13.5*  HGB 10.9*  HCT 33.2*  PLT 377   ------------------------------------------------------------------------------------------------------------------  Chemistries  Recent Labs  Lab 07/29/19 1245  NA 136  K 3.0*  CL 98  CO2 27  GLUCOSE 198*  BUN 19  CREATININE 1.04  CALCIUM 9.0  AST 42*  ALT 36  ALKPHOS 69  BILITOT 0.6   ------------------------------------------------------------------------------------------------------------------  Cardiac Enzymes No results for input(s): TROPONINI in the last 168 hours. ------------------------------------------------------------------------------------------------------------------  RADIOLOGY:  Dg Chest Portable 1 View  Result Date: 07/29/2019 CLINICAL DATA:  Shortness of breath.  Cough and wheezing. EXAM: PORTABLE CHEST 1 VIEW  COMPARISON:  07/28/2019 FINDINGS: The heart size and mediastinal contours are within normal limits. Both lungs are clear. The visualized skeletal structures are unremarkable. IMPRESSION: No active disease. Electronically Signed   By: Lorriane Shire M.D.   On: 07/29/2019 14:25   Dg Chest Port 1 View  Result Date: 07/28/2019 CLINICAL DATA:  Shortness of breath and cough EXAM: PORTABLE CHEST 1 VIEW COMPARISON:  March 06, 2013 FINDINGS: There is no edema or consolidation. Heart is upper normal in size with pulmonary vascularity normal. No adenopathy. No bone lesions. IMPRESSION: No edema or consolidation.  Heart upper normal in size. Electronically Signed   By: Lowella Grip III M.D.   On: 07/28/2019 12:10     IMPRESSION AND PLAN:   38 year old male with past medical history of  schizophrenia, hypertension, history of gout, ongoing tobacco abuse who presents to the hospital complaining of shortness of breath.  1.  Acute respiratory failure with hypoxia-  likely secondary to COPD exacerbation from ongoing tobacco abuse.   -Continue O2 supplementation, will treat underlying COPD with IV steroids, scheduled duo nebs, Pulmicort nebs. -Wean oxygen as tolerated.  2.  COPD exacerbation- secondary to ongoing tobacco abuse.  Patient's chest x-ray is negative for pneumonia. -We will treat patient with IV steroids, scheduled duo nebs, Pulmicort nebs. -Assess the patient for home oxygen prior to discharge.  3. Tobacco abuse -  will place on Nicotine patch.   4.  Essential hypertension-continue Norvasc, Avapro  5.  History of gout-no acute attack.  Continue allopurinol.  6.  Diabetes type 2 without complication- continue metformin, will place on sliding scale insulin. -Check a hemoglobin A1c.  7.  History of schizophrenia- continue Invega.     Patient's COVID-19 test yesterday was negative but repeat one is still pending.  All the records are reviewed and case discussed with ED provider.  Management plans discussed with the patient, family and they are in agreement.  CODE STATUS: Full code  TOTAL TIME TAKING CARE OF THIS PATIENT: 40 minutes.    Houston Siren M.D on 07/29/2019 at 4:18 PM  Between 7am to 6pm - Pager - [phone]  After 6pm go to [email or web address] - password [email or web address] [facility]  [place] [facility]  Office  [phone]  CC: Primary care physician; Sherron Monday, MD

## 2019-07-30 ENCOUNTER — Inpatient Hospital Stay (HOSPITAL_COMMUNITY)
Admit: 2019-07-30 | Discharge: 2019-07-30 | Disposition: A | Discharge status: Home / Self Care | Payer: Medicare Other | Attending: Specialist | Admitting: Specialist

## 2019-07-30 DIAGNOSIS — I361 Nonrheumatic tricuspid (valve) insufficiency: Secondary | ICD-10-CM

## 2019-07-30 DIAGNOSIS — I340 Nonrheumatic mitral (valve) insufficiency: Secondary | ICD-10-CM | POA: Diagnosis not present

## 2019-07-30 DIAGNOSIS — J441 Chronic obstructive pulmonary disease with (acute) exacerbation: Secondary | ICD-10-CM | POA: Diagnosis not present

## 2019-07-30 LAB — BASIC METABOLIC PANEL
Anion gap: 8 (ref 5–15)
BUN: 19 mg/dL (ref 6–20)
CO2: 26 mmol/L (ref 22–32)
Calcium: 8.8 mg/dL — ABNORMAL LOW (ref 8.9–10.3)
Chloride: 102 mmol/L (ref 98–111)
Creatinine, Ser: 0.88 mg/dL (ref 0.61–1.24)
GFR calc Af Amer: >60 mL/min (ref >60)
GFR calc non Af Amer: >60 mL/min (ref >60)
Glucose, Bld: 260 mg/dL — ABNORMAL HIGH (ref 70–99)
Potassium: 4.2 mmol/L (ref 3.5–5.1)
Sodium: 136 mmol/L (ref 135–145)

## 2019-07-30 LAB — ECHOCARDIOGRAM COMPLETE
Height: 68 in
Weight: 4180.8 oz

## 2019-07-30 LAB — CBC
HCT: 34.5 % — ABNORMAL LOW (ref 39.0–52.0)
Hemoglobin: 11.2 g/dL — ABNORMAL LOW (ref 13.0–17.0)
MCH: 26.9 pg (ref 26.0–34.0)
MCHC: 32.5 g/dL (ref 30.0–36.0)
MCV: 82.7 fL (ref 80.0–100.0)
Platelets: 364 10*3/uL (ref 150–400)
RBC: 4.17 MIL/uL — ABNORMAL LOW (ref 4.22–5.81)
RDW: 17.2 % — ABNORMAL HIGH (ref 11.5–15.5)
WBC: 13.2 10*3/uL — ABNORMAL HIGH (ref 4.0–10.5)
nRBC: 0 % (ref 0.0–0.2)

## 2019-07-30 LAB — GLUCOSE, CAPILLARY
Glucose-Capillary: 198 mg/dL — ABNORMAL HIGH (ref 70–99)
Glucose-Capillary: 233 mg/dL — ABNORMAL HIGH (ref 70–99)

## 2019-07-30 MED ORDER — METFORMIN HCL 1000 MG PO TABS: 1000.0000 mg | ORAL_TABLET | Freq: Two times a day (BID) | ORAL | 1 refills | Status: DC

## 2019-07-30 MED ORDER — OLMESARTAN MEDOXOMIL 20 MG PO TABS: 20.0000 mg | ORAL_TABLET | Freq: Every day | ORAL | 1 refills | Status: DC

## 2019-07-30 MED ORDER — METHYLPREDNISOLONE SODIUM SUCC 40 MG IJ SOLR: 40.0000 mg | Freq: Two times a day (BID) | INTRAMUSCULAR | Status: DC

## 2019-07-30 MED ORDER — ALBUTEROL SULFATE HFA 108 (90 BASE) MCG/ACT IN AERS: 2.0000 | INHALATION_SPRAY | Freq: Four times a day (QID) | RESPIRATORY_TRACT | 1 refills | Status: DC | PRN

## 2019-07-30 MED ORDER — AMLODIPINE BESYLATE 10 MG PO TABS: 10.0000 mg | ORAL_TABLET | Freq: Every day | ORAL | 1 refills | Status: DC

## 2019-07-30 MED ORDER — PREDNISONE 10 MG PO TABS: ORAL_TABLET | ORAL | 0 refills | Status: DC

## 2019-07-30 MED ORDER — PREDNISONE 50 MG PO TABS: 50.0000 mg | ORAL_TABLET | Freq: Every day | ORAL | Status: DC

## 2019-07-30 NOTE — Progress Notes (Signed)
Patient educated and adequate for discharge to home with mother. Patient states he should have no problems getting his medications at the pharmacy, and plans to stop smoking and "never start again."

## 2019-07-30 NOTE — Discharge Summary (Signed)
[facility] [place] at [facility]   PATIENT NAME: Walter Pena    MR#:  [id number]  DATE OF BIRTH:  12-Sep-1981  DATE OF ADMISSION:  07/29/2019 ADMITTING PHYSICIAN: Houston SirenVivek J Sainani, MD  DATE OF DISCHARGE: *07/30/2019  PRIMARY CARE PHYSICIAN: Sherron Mondayejan-Sie, S Ahmed, MD    ADMISSION DIAGNOSIS:  Cough DX CHF  DISCHARGE DIAGNOSIS:  acute COPD exacerbation tobacco abuse  SECONDARY DIAGNOSIS:   Past Medical History:  Diagnosis Date  . Gout   . Hidradenitis   . Hypertension   . Schizophrenia [facility](HCC)     HOSPITAL COURSE:  38 year old male with past medical history of schizophrenia, hypertension, history of gout,ongoing tobacco abuse who presents to the hospital complaining of shortness of breath.  1. Acute respiratory failure with hypoxia- likely secondary to COPD exacerbation from ongoing tobacco abuse.   -Continue O2 supplementation, will treat underlying COPD with IV steroids, scheduled duo nebs, Pulmicort nebs. -Wean oxygen as tolerated. -Is oxygen saturation more than 92% on room air. He feels a lot better. He feels at baseline. -Change to steroid taper.  2. COPD exacerbation-secondary to ongoing tobacco abuse. Patient's chest x-ray is negative for pneumonia. -Treatment as above. No oxygen need for home  3. Tobacco abuse - will place on Nicotine patch.  advised smoking cessation  4. Essential hypertension-continue Norvasc, Avapro-- scription's were called in  5.History of gout-no acute attack. Continue allopurinol.  6. Diabetes type 2 without complication-continue metformin, will place on sliding scale insulin. -Patient is advised to continue his meds and follow-up with Dr. Karen Kitchens Jancey. Dietary counseling done  7. History of schizophrenia-continue Invega.  Spoke with sister on FaceTime in the room. CONSULTS OBTAINED:    DRUG ALLERGIES:   Allergies  Allergen Reactions  . Bactrim [Sulfamethoxazole-Trimethoprim] Nausea  And Vomiting  . Lisinopril Swelling  . Other Rash    aloe    DISCHARGE MEDICATIONS:   Allergies as of 07/30/2019      Reactions   Bactrim [sulfamethoxazole-trimethoprim] Nausea And Vomiting   Lisinopril Swelling   Other Rash   aloe      Medication List    TAKE these medications   albuterol 108 (90 Base) MCG/ACT inhaler Commonly known as: VENTOLIN HFA Inhale 2 puffs into the lungs every 6 (six) hours as needed for wheezing or shortness of breath.   allopurinol 100 MG tablet Commonly known as: ZYLOPRIM Take 100 mg by mouth daily.   amLODipine 10 MG tablet Commonly known as: NORVASC Take 1 tablet (10 mg total) by mouth daily.   Invega Sustenna 156 MG/ML Susp injection Generic drug: paliperidone Inject 156 mg into the muscle once.   metFORMIN 1000 MG tablet Commonly known as: GLUCOPHAGE Take 1 tablet (1,000 mg total) by mouth 2 (two) times daily.   olmesartan 20 MG tablet Commonly known as: BENICAR Take 1 tablet (20 mg total) by mouth daily.   predniSONE 10 MG tablet Commonly known as: DELTASONE Start with 50 mg daily. Taper by 10 mg daily then stop. Start taking on: July 31, 2019       If you experience worsening of your admission symptoms, develop shortness of breath, life threatening emergency, suicidal or homicidal thoughts you must seek medical attention immediately by calling 911 or calling your MD immediately  if symptoms less severe.  You Must read complete instructions/literature along with all the possible adverse reactions/side effects for all the Medicines you take and that have been prescribed to you. Take any new Medicines after you have completely  understood and accept all the possible adverse reactions/side effects.   Please note  You were cared for by a hospitalist during your hospital stay. If you have any questions about your discharge medications or the care you received while you were in the hospital after you are discharged, you can call  the unit and asked to speak with the hospitalist on call if the hospitalist that took care of you is not available. Once you are discharged, your primary care physician will handle any further medical issues. Please note that NO REFILLS for any discharge medications will be authorized once you are discharged, as it is imperative that you return to your primary care physician (or establish a relationship with a primary care physician if you do not have one) for your aftercare needs so that they can reassess your need for medications and monitor your lab values. Today   SUBJECTIVE   Feel a lot better. Back to baseline. Can I go home?  VITAL SIGNS:  Blood pressure (!) 154/107, pulse 84, temperature 98.2 F (36.8 C), temperature source Oral, resp. rate 20, height 5\' 8"  (1.727 m), weight 118.5 kg, SpO2 97 %.  I/O:    Intake/Output Summary (Last 24 hours) at 07/30/2019 1358 Last data filed at 07/30/2019 1022 Gross per 24 hour  Intake 240 ml  Output 825 ml  Net -585 ml    PHYSICAL EXAMINATION:  GENERAL:  38 y.o.-year-old patient lying in the bed with no acute distress. obese EYES: Pupils equal, round, reactive to light and accommodation. No scleral icterus. Extraocular muscles intact.  HEENT: Head atraumatic, normocephalic. Oropharynx and nasopharynx clear.  NECK:  Supple, no jugular venous distention. No thyroid enlargement, no tenderness.  LUNGS: Normal breath sounds bilaterally, no wheezing, rales,rhonchi or crepitation. No use of accessory muscles of respiration.  CARDIOVASCULAR: S1, S2 normal. No murmurs, rubs, or gallops.  ABDOMEN: Soft, non-tender, non-distended. Bowel sounds present. No organomegaly or mass.  EXTREMITIES: No pedal edema, cyanosis, or clubbing.  NEUROLOGIC: Cranial nerves II through XII are intact. Muscle strength 5/5 in all extremities. Sensation intact. Gait not checked.  PSYCHIATRIC: The patient is alert and oriented x 3.  SKIN: No obvious rash, lesion, or ulcer.    DATA REVIEW:   CBC  Recent Labs  Lab 07/30/19 0509  WBC 13.2*  HGB 11.2*  HCT 34.5*  PLT 364    Chemistries  Recent Labs  Lab 07/29/19 1245 07/30/19 0509  NA 136 136  K 3.0* 4.2  CL 98 102  CO2 27 26  GLUCOSE 198* 260*  BUN 19 19  CREATININE 1.04 0.88  CALCIUM 9.0 8.8*  AST 42*  --   ALT 36  --   ALKPHOS 69  --   BILITOT 0.6  --     Microbiology Results   Recent Results (from the past 240 hour(s))  SARS Coronavirus 2 [facility](Hospital order, Performed in [facility] hospital lab) Nasopharyngeal Nasopharyngeal Swab     Status: None   Collection Time: 07/28/19 11:38 AM   Specimen: Nasopharyngeal Swab  Result Value Ref Range Status   SARS Coronavirus 2 NEGATIVE NEGATIVE Final    Comment: (NOTE) If result is NEGATIVE SARS-CoV-2 target nucleic acids are NOT DETECTED. The SARS-CoV-2 RNA is generally detectable in upper and lower  respiratory specimens during the acute phase of infection. The lowest  concentration of SARS-CoV-2 viral copies this assay can detect is 250  copies / mL. A negative result does not preclude SARS-CoV-2 infection  and should not be  used as the sole basis for treatment or other  patient management decisions.  A negative result may occur with  improper specimen collection / handling, submission of specimen other  than nasopharyngeal swab, presence of viral mutation(s) within the  areas targeted by this assay, and inadequate number of viral copies  (<250 copies / mL). A negative result must be combined with clinical  observations, patient history, and epidemiological information. If result is POSITIVE SARS-CoV-2 target nucleic acids are DETECTED. The SARS-CoV-2 RNA is generally detectable in upper and lower  respiratory specimens dur ing the acute phase of infection.  Positive  results are indicative of active infection with SARS-CoV-2.  Clinical  correlation with patient history and other diagnostic information is  necessary to determine  patient infection status.  Positive results do  not rule out bacterial infection or co-infection with other viruses. If result is PRESUMPTIVE POSTIVE SARS-CoV-2 nucleic acids MAY BE PRESENT.   A presumptive positive result was obtained on the submitted specimen  and confirmed on repeat testing.  While 2019 novel coronavirus  (SARS-CoV-2) nucleic acids may be present in the submitted sample  additional confirmatory testing may be necessary for epidemiological  and / or clinical management purposes  to differentiate between  SARS-CoV-2 and other Sarbecovirus currently known to infect humans.  If clinically indicated additional testing with an alternate test  methodology [phone]) is advised. The SARS-CoV-2 RNA is generally  detectable in upper and lower respiratory sp ecimens during the acute  phase of infection. The expected result is Negative. Fact Sheet for Patients:  StrictlyIdeas.no Fact Sheet for Healthcare Providers: BankingDealers.co.za This test is not yet approved or cleared by the [place] FDA and has been authorized for detection and/or diagnosis of SARS-CoV-2 by FDA under an Emergency Use Authorization (EUA).  This EUA will remain in effect (meaning this test can be used) for the duration of the COVID-19 declaration under Section 564(b)(1) of the Act, 21 U.S.C. section 360bbb-3(b)(1), unless the authorization is terminated or revoked sooner. Performed at [facility], [address] [place], [place] [address]   SARS Coronavirus 2 [facility] order, Performed in [facility] hospital lab) Nasopharyngeal Nasopharyngeal Swab     Status: None   Collection Time: 07/29/19  2:47 PM   Specimen: Nasopharyngeal Swab  Result Value Ref Range Status   SARS Coronavirus 2 NEGATIVE NEGATIVE Final    Comment: (NOTE) If result is NEGATIVE SARS-CoV-2 target nucleic acids are NOT DETECTED. The SARS-CoV-2 RNA is generally  detectable in upper and lower  respiratory specimens during the acute phase of infection. The lowest  concentration of SARS-CoV-2 viral copies this assay can detect is 250  copies / mL. A negative result does not preclude SARS-CoV-2 infection  and should not be used as the sole basis for treatment or other  patient management decisions.  A negative result may occur with  improper specimen collection / handling, submission of specimen other  than nasopharyngeal swab, presence of viral mutation(s) within the  areas targeted by this assay, and inadequate number of viral copies  (<250 copies / mL). A negative result must be combined with clinical  observations, patient history, and epidemiological information. If result is POSITIVE SARS-CoV-2 target nucleic acids are DETECTED. The SARS-CoV-2 RNA is generally detectable in upper and lower  respiratory specimens dur ing the acute phase of infection.  Positive  results are indicative of active infection with SARS-CoV-2.  Clinical  correlation with patient history and other diagnostic information is  necessary  to determine patient infection status.  Positive results do  not rule out bacterial infection or co-infection with other viruses. If result is PRESUMPTIVE POSTIVE SARS-CoV-2 nucleic acids MAY BE PRESENT.   A presumptive positive result was obtained on the submitted specimen  and confirmed on repeat testing.  While 2019 novel coronavirus  (SARS-CoV-2) nucleic acids may be present in the submitted sample  additional confirmatory testing may be necessary for epidemiological  and / or clinical management purposes  to differentiate between  SARS-CoV-2 and other Sarbecovirus currently known to infect humans.  If clinically indicated additional testing with an alternate test  methodology [phone]([id number]) is advised. The SARS-CoV-2 RNA is generally  detectable in upper and lower respiratory sp ecimens during the acute  phase of infection. The  expected result is Negative. Fact Sheet for Patients:  BoilerBrush.com.cyhttps://www.fda.gov/media/136312/download Fact Sheet for Healthcare Providers: https://pope.com/https://www.fda.gov/media/136313/download This test is not yet approved or cleared by the [place] FDA and has been authorized for detection and/or diagnosis of SARS-CoV-2 by FDA under an Emergency Use Authorization (EUA).  This EUA will remain in effect (meaning this test can be used) for the duration of the COVID-19 declaration under Section 564(b)(1) of the Act, 21 U.S.C. section 360bbb-3(b)(1), unless the authorization is terminated or revoked sooner. Performed at [facility], [address] [place], [place] [address]     RADIOLOGY:  Dg Chest Portable 1 View  Result Date: 07/29/2019 CLINICAL DATA:  Shortness of breath.  Cough and wheezing. EXAM: PORTABLE CHEST 1 VIEW COMPARISON:  07/28/2019 FINDINGS: The heart size and mediastinal contours are within normal limits. Both lungs are clear. The visualized skeletal structures are unremarkable. IMPRESSION: No active disease. Electronically Signed   By: Francene BoyersJames  Maxwell M.D.   On: 07/29/2019 14:25     CODE STATUS:     Code Status Orders  (From admission, onward)         Start     Ordered   07/29/19 1727  Full code  Continuous     07/29/19 1726        Code Status History    Date Active Date Inactive Code Status Order ID Comments User Context   10/20/2017 2210 10/27/2017 1649 Full Code [id number]  Audery Amellapacs, John T, MD Inpatient   02/17/2017 0436 02/26/2017 1547 Full Code [id number]  Audery Amellapacs, John T, MD Inpatient   01/08/2017 0905 01/08/2017 1839 Full Code [id number]  Arnaldo Nataliamond, Michael S, MD Inpatient   05/07/2016 2120 05/12/2016 1613 Full Code [id number]  Audery Amellapacs, John T, MD Inpatient   01/20/2016 0042 01/24/2016 1809 Full Code [id number]  Brandy HaleFaheem, Uzma, MD Inpatient   Advance Care Planning Activity      TOTAL TIME TAKING CARE OF THIS PATIENT: *40* minutes.    Enedina FinnerSona Sherline Eberwein M.D on 07/30/2019 at  1:58 PM  Between 7am to 6pm - Pager - [phone] After 6pm go to [email or web address] - password [email or web address]  [facility]  Office  [phone]  CC: Primary care physician; Sherron Mondayejan-Sie, S Ahmed, MD

## 2019-07-30 NOTE — Discharge Instructions (Signed)
Patient advised a small stop smoking. He is also advised to take all his home meds regularly. I have called in his prescriptions for blood pressure medicine and diabetes. He'll follow-up with Dr. Elijio Miles as outpatient

## 2019-07-30 NOTE — Progress Notes (Signed)
*  PRELIMINARY RESULTS* Echocardiogram 2D Echocardiogram has been performed.  Lavell Luster Rachal Dvorsky 07/30/2019, 11:28 AM

## 2019-08-01 LAB — HIV ANTIBODY (ROUTINE TESTING W REFLEX): HIV Screen 4th Generation wRfx: NONREACTIVE

## 2019-08-01 LAB — HEMOGLOBIN A1C
Hgb A1c MFr Bld: 6.9 % — ABNORMAL HIGH (ref 4.8–5.6)
Mean Plasma Glucose: 151 mg/dL

## 2019-08-04 ENCOUNTER — Other Ambulatory Visit: Payer: Self-pay

## 2019-08-04 ENCOUNTER — Encounter: Payer: Self-pay | Admitting: Podiatry

## 2019-08-04 ENCOUNTER — Ambulatory Visit (INDEPENDENT_AMBULATORY_CARE_PROVIDER_SITE_OTHER): Payer: Self-pay | Admitting: Podiatry

## 2019-08-04 DIAGNOSIS — M79675 Pain in left toe(s): Secondary | ICD-10-CM | POA: Diagnosis not present

## 2019-08-04 DIAGNOSIS — M79674 Pain in right toe(s): Secondary | ICD-10-CM

## 2019-08-04 DIAGNOSIS — B351 Tinea unguium: Secondary | ICD-10-CM | POA: Diagnosis not present

## 2019-08-04 DIAGNOSIS — E119 Type 2 diabetes mellitus without complications: Secondary | ICD-10-CM | POA: Insufficient documentation

## 2019-08-04 NOTE — Progress Notes (Signed)
This patient presents to the office with chief complaint of long thick nails and diabetic feet.  This patient  says there  is  no pain and discomfort in his  feet.  This patient says there are long thick painful nails.  These nails are painful walking and wearing shoes.  Patient has no history of infection or drainage from both feet.  Patient is unable to  self treat his own nails . This patient presents  to the office today for treatment of the  long nails and a foot evaluation due to history of  diabetes.  General Appearance  Alert, conversant and in no acute stress.  Vascular  Dorsalis pedis  are palpable  bilaterally.  Posterior tibial pulses are weakly palpable .   Capillary return is within normal limits  bilaterally. Temperature is within normal limits  bilaterally.  Neurologic  Senn-Weinstein monofilament wire test within normal limits  bilaterally. Muscle power within normal limits bilaterally.  Nails Thick disfigured discolored nails with subungual debris  from hallux to fifth toes bilaterally. No evidence of bacterial infection or drainage bilaterally.  Orthopedic  No limitations of motion of motion feet .  No crepitus or effusions noted.  No bony pathology or digital deformities noted. 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  Skin  normotropic skin with no porokeratosis noted bilaterally.  No signs of infections or ulcers noted.   Healing rash on dorsum of left foot.  Onychomycosis  Diabetes with no foot complications  IE  Debride nails x 10.  A diabetic foot exam was performed and there is no evidence of any vascular or neurologic pathology.   RTC 3 months.   Gardiner Barefoot DPM

## 2019-10-15 ENCOUNTER — Other Ambulatory Visit: Payer: Self-pay

## 2019-10-15 ENCOUNTER — Emergency Department
Admission: EM | Admit: 2019-10-15 | Discharge: 2019-10-15 | Disposition: A | Discharge status: Home / Self Care | Payer: Medicare Other | Attending: Emergency Medicine | Admitting: Emergency Medicine

## 2019-10-15 ENCOUNTER — Emergency Department: Payer: Medicare Other

## 2019-10-15 DIAGNOSIS — Z79899 Other long term (current) drug therapy: Secondary | ICD-10-CM | POA: Insufficient documentation

## 2019-10-15 DIAGNOSIS — I110 Hypertensive heart disease with heart failure: Secondary | ICD-10-CM | POA: Diagnosis not present

## 2019-10-15 DIAGNOSIS — Z7984 Long term (current) use of oral hypoglycemic drugs: Secondary | ICD-10-CM | POA: Diagnosis not present

## 2019-10-15 DIAGNOSIS — R0602 Shortness of breath: Secondary | ICD-10-CM | POA: Diagnosis present

## 2019-10-15 DIAGNOSIS — J441 Chronic obstructive pulmonary disease with (acute) exacerbation: Secondary | ICD-10-CM | POA: Insufficient documentation

## 2019-10-15 DIAGNOSIS — I509 Heart failure, unspecified: Secondary | ICD-10-CM | POA: Diagnosis not present

## 2019-10-15 DIAGNOSIS — F17210 Nicotine dependence, cigarettes, uncomplicated: Secondary | ICD-10-CM | POA: Diagnosis not present

## 2019-10-15 DIAGNOSIS — E119 Type 2 diabetes mellitus without complications: Secondary | ICD-10-CM | POA: Insufficient documentation

## 2019-10-15 DIAGNOSIS — Z20828 Contact with and (suspected) exposure to other viral communicable diseases: Secondary | ICD-10-CM | POA: Diagnosis not present

## 2019-10-15 HISTORY — DX: Heart failure, unspecified: I50.9

## 2019-10-15 HISTORY — DX: Chronic obstructive pulmonary disease, unspecified: J44.9

## 2019-10-15 LAB — BASIC METABOLIC PANEL
Anion gap: 10 (ref 5–15)
BUN: 14 mg/dL (ref 6–20)
CO2: 26 mmol/L (ref 22–32)
Calcium: 8.4 mg/dL — ABNORMAL LOW (ref 8.9–10.3)
Chloride: 102 mmol/L (ref 98–111)
Creatinine, Ser: 1.18 mg/dL (ref 0.61–1.24)
GFR calc Af Amer: >60 mL/min (ref >60)
GFR calc non Af Amer: >60 mL/min (ref >60)
Glucose, Bld: 181 mg/dL — ABNORMAL HIGH (ref 70–99)
Potassium: 3.2 mmol/L — ABNORMAL LOW (ref 3.5–5.1)
Sodium: 138 mmol/L (ref 135–145)

## 2019-10-15 LAB — URINALYSIS, COMPLETE (UACMP) WITH MICROSCOPIC
Bacteria, UA: NONE SEEN
Bilirubin Urine: NEGATIVE
Glucose, UA: NEGATIVE mg/dL
Hgb urine dipstick: NEGATIVE
Ketones, ur: NEGATIVE mg/dL
Nitrite: NEGATIVE
Protein, ur: NEGATIVE mg/dL
Specific Gravity, Urine: 1.004 — ABNORMAL LOW (ref 1.005–1.030)
pH: 7 (ref 5.0–8.0)

## 2019-10-15 LAB — CBC WITH DIFFERENTIAL/PLATELET
Abs Immature Granulocytes: 0.08 10*3/uL — ABNORMAL HIGH (ref 0.00–0.07)
Basophils Absolute: 0 10*3/uL (ref 0.0–0.1)
Basophils Relative: 0 %
Eosinophils Absolute: 0.2 10*3/uL (ref 0.0–0.5)
Eosinophils Relative: 1 %
HCT: 33.9 % — ABNORMAL LOW (ref 39.0–52.0)
Hemoglobin: 10.7 g/dL — ABNORMAL LOW (ref 13.0–17.0)
Immature Granulocytes: 1 %
Lymphocytes Relative: 37 %
Lymphs Abs: 5.4 10*3/uL — ABNORMAL HIGH (ref 0.7–4.0)
MCH: 26.4 pg (ref 26.0–34.0)
MCHC: 31.6 g/dL (ref 30.0–36.0)
MCV: 83.7 fL (ref 80.0–100.0)
Monocytes Absolute: 0.6 10*3/uL (ref 0.1–1.0)
Monocytes Relative: 4 %
Neutro Abs: 8.2 10*3/uL — ABNORMAL HIGH (ref 1.7–7.7)
Neutrophils Relative %: 57 %
Platelets: 363 10*3/uL (ref 150–400)
RBC: 4.05 MIL/uL — ABNORMAL LOW (ref 4.22–5.81)
RDW: 15.5 % (ref 11.5–15.5)
WBC: 14.5 10*3/uL — ABNORMAL HIGH (ref 4.0–10.5)
nRBC: 0 % (ref 0.0–0.2)

## 2019-10-15 LAB — TROPONIN I (HIGH SENSITIVITY)
Troponin I (High Sensitivity): 23 ng/L — ABNORMAL HIGH (ref <18)
Troponin I (High Sensitivity): 19 ng/L — ABNORMAL HIGH (ref <18)

## 2019-10-15 LAB — SARS CORONAVIRUS 2 (TAT 6-24 HRS): SARS Coronavirus 2: NEGATIVE

## 2019-10-15 LAB — BRAIN NATRIURETIC PEPTIDE: B Natriuretic Peptide: 285 pg/mL — ABNORMAL HIGH (ref 0.0–100.0)

## 2019-10-15 LAB — HIV ANTIBODY (ROUTINE TESTING W REFLEX): HIV Screen 4th Generation wRfx: NONREACTIVE

## 2019-10-15 MED ORDER — LIDOCAINE HCL (PF) 1 % IJ SOLN
INTRAMUSCULAR | Status: AC
  Administered 2019-10-15: 07:00:00 5 mL
  Filled 2019-10-15: qty 5

## 2019-10-15 MED ORDER — ALBUTEROL SULFATE HFA 108 (90 BASE) MCG/ACT IN AERS: 2.0000 | INHALATION_SPRAY | Freq: Four times a day (QID) | RESPIRATORY_TRACT | 0 refills | Status: DC | PRN

## 2019-10-15 MED ORDER — FUROSEMIDE 10 MG/ML IJ SOLN
40.0000 mg | Freq: Once | INTRAMUSCULAR | Status: AC
  Administered 2019-10-15: 40 mg via INTRAVENOUS
  Filled 2019-10-15: qty 4

## 2019-10-15 MED ORDER — AZITHROMYCIN 500 MG PO TABS: 500.0000 mg | ORAL_TABLET | Freq: Every day | ORAL | 0 refills | Status: AC

## 2019-10-15 MED ORDER — NITROGLYCERIN 2 % TD OINT
1.0000 [in_us] | TOPICAL_OINTMENT | Freq: Four times a day (QID) | TRANSDERMAL | Status: DC
  Administered 2019-10-15: 1 [in_us] via TOPICAL
  Filled 2019-10-15: qty 1

## 2019-10-15 MED ORDER — PREDNISONE 20 MG PO TABS: 60.0000 mg | ORAL_TABLET | Freq: Every day | ORAL | 0 refills | Status: AC

## 2019-10-15 MED ORDER — AZITHROMYCIN 500 MG PO TABS
1000.0000 mg | ORAL_TABLET | Freq: Once | ORAL | Status: AC
  Administered 2019-10-15: 1000 mg via ORAL
  Filled 2019-10-15: qty 2

## 2019-10-15 MED ORDER — CEFTRIAXONE SODIUM 250 MG IJ SOLR
250.0000 mg | Freq: Once | INTRAMUSCULAR | Status: AC
  Administered 2019-10-15: 250 mg via INTRAMUSCULAR
  Filled 2019-10-15: qty 250

## 2019-10-15 NOTE — ED Triage Notes (Signed)
pt arrived from home via [facility] with complaint of sudden onset of shortness of breath which awakened him from sleep. Pt was hypertensive at 194/130. EMS gave 2 duonebs, 125mg  solumedrol, 5mg  albuterol. EMS reports pt improved post-medication. Pt remains tachypnic but reports feeling improved

## 2019-10-15 NOTE — ED Notes (Signed)
Pt provided ginger ale and graham crackers.

## 2019-10-15 NOTE — ED Provider Notes (Signed)
[facility] Emergency Department Provider Note   First MD Initiated Contact with Patient 10/15/19 0400     (approximate)  I have reviewed the triage vital signs and the nursing notes.   HISTORY  Chief Complaint Shortness of Breath    HPI [name]. is a 38 y.o. male with below list of previous medical conditions including COPD CHF hypertension presents to the emergency department via EMS secondary to acute onset of dyspnea on awakening him from sleep tonight.  Patient states that he was in his normal state of health before going to sleep without any difficulty breathing but awoke with considerable respiratory difficulty.  EMS states on their arrival patient noted to be hypertensive with a blood pressure 194/130.  Patient was given 2 duo nebs 125 mg Solu-Medrol 5 mg of albuterol while in route to the emergency department.  EMS states that the patient has been noncompliant with his medication and the patient states that this is true secondary to "stubborn".  Patient denies any chest pain at present stating that his breathing has improved.       Past Medical History:  Diagnosis Date  . CHF (congestive heart failure) ([place])   . COPD (chronic obstructive pulmonary disease) ([place])   . Gout   . Hidradenitis   . Hypertension   . Schizophrenia [facility])     Patient Active Problem List   Diagnosis Date Noted  . Pain due to onychomycosis of toenails of both feet 08/04/2019  . Diabetes mellitus without complication ([place]) 62/22/9798  . Acute respiratory failure with hypoxia ([place]) 07/28/2019  . Schizophrenia, undifferentiated ([place]) 10/20/2017  . Diabetes ([place]) 02/18/2017  . Schizophrenia ([place]) 02/17/2017  . Alcohol use disorder, moderate, dependence ([place]) 02/17/2017  . Noncompliance 05/07/2016  . HTN (hypertension) 01/21/2016  . Cannabis use disorder, moderate, dependence ([place]) 01/21/2016  . Tobacco use disorder 01/21/2016  . Gout 01/21/2016  .  Hidradenitis 01/21/2016    Past Surgical History:  Procedure Laterality Date  . sweat gland removal      Prior to Admission medications   Medication Sig Start Date End Date Taking? Authorizing Provider  albuterol (VENTOLIN HFA) 108 (90 Base) MCG/ACT inhaler Inhale 2 puffs into the lungs every 6 (six) hours as needed for wheezing or shortness of breath. 07/30/19   Fritzi Mandes, MD  allopurinol (ZYLOPRIM) 100 MG tablet Take 100 mg by mouth daily. 01/21/17   [provider]  amLODipine (NORVASC) 10 MG tablet Take 1 tablet (10 mg total) by mouth daily. 07/30/19   Fritzi Mandes, MD  metFORMIN (GLUCOPHAGE) 1000 MG tablet Take 1 tablet (1,000 mg total) by mouth 2 (two) times daily. 07/30/19   Fritzi Mandes, MD  olmesartan (BENICAR) 20 MG tablet Take 1 tablet (20 mg total) by mouth daily. 07/30/19   Fritzi Mandes, MD  paliperidone (INVEGA SUSTENNA) 156 MG/ML SUSP injection Inject 156 mg into the muscle once.    [provider]  predniSONE (DELTASONE) 10 MG tablet Start with 50 mg daily. Taper by 10 mg daily then stop. 07/31/19   Fritzi Mandes, MD  traMADol Veatrice Bourbon) 50 MG tablet  07/30/19   [provider]    Allergies Bactrim [sulfamethoxazole-trimethoprim], Lisinopril, and Other  Family History  Problem Relation Age of Onset  . Diabetes Mellitus II Mother   . Hypertension Mother   . Hypertension Father     Social History Social History   Tobacco Use  . Smoking status: Current Every Day Smoker  Packs/day: 1.00    Years: 25.00    Pack years: 25.00    Types: Cigarettes  . Smokeless tobacco: Never Used  Substance Use Topics  . Alcohol use: Yes    Comment: Socially  . Drug use: Yes    Types: Marijuana    Review of Systems Constitutional: No fever/chills Eyes: No visual changes. ENT: No sore throat. Cardiovascular: Denies chest pain. Respiratory: Positive for dyspnea Gastrointestinal: No abdominal pain.  No nausea, no vomiting.  No diarrhea.  No constipation.  Genitourinary: Negative for dysuria. Musculoskeletal: Negative for neck pain.  Negative for back pain. Integumentary: Negative for rash. Neurological: Negative for headaches, focal weakness or numbness.   ____________________________________________   PHYSICAL EXAM:  VITAL SIGNS: ED Triage Vitals  Enc Vitals Group     BP 10/15/19 0336 (!) 181/104     Pulse Rate 10/15/19 0336 (!) 109     Resp 10/15/19 0336 (!) 36     Temp 10/15/19 0505 97.9 F (36.6 C)     Temp Source 10/15/19 0505 Oral     SpO2 10/15/19 0336 96 %     Weight 10/15/19 0337 120.2 kg (265 lb)     Height 10/15/19 0337 1.727 m (5\' 8" )     Head Circumference --      Peak Flow --      Pain Score 10/15/19 0337 0     Pain Loc --      Pain Edu? --      Excl. in [place]? --     Constitutional: Alert and oriented.  Eyes: Conjunctivae are normal.  Mouth/Throat: Patient is wearing a mask. Neck: No stridor.  No meningeal signs.   Cardiovascular: Normal rate, regular rhythm. Good peripheral circulation. Grossly normal heart sounds. Respiratory: Bibasilar rhonchi with expiratory wheezing no retractions. Gastrointestinal: Soft and nontender. No distention.   Musculoskeletal: No lower extremity tenderness nor edema. No gross deformities of extremities. Neurologic:  Normal speech and language. No gross focal neurologic deficits are appreciated.  Skin:  Skin is warm, dry and intact. Psychiatric: Mood and affect are normal. Speech and behavior are normal.  ____________________________________________   LABS (all labs ordered are listed, but only abnormal results are displayed)  Labs Reviewed  CBC WITH DIFFERENTIAL/PLATELET - Abnormal; Notable for the following components:      Result Value   WBC 14.5 (*)    RBC 4.05 (*)    Hemoglobin 10.7 (*)    HCT 33.9 (*)    Neutro Abs 8.2 (*)    Lymphs Abs 5.4 (*)    Abs Immature Granulocytes 0.08 (*)    All other components within normal limits  BASIC METABOLIC PANEL - Abnormal;  Notable for the following components:   Potassium 3.2 (*)    Glucose, Bld 181 (*)    Calcium 8.4 (*)    All other components within normal limits  BRAIN NATRIURETIC PEPTIDE - Abnormal; Notable for the following components:   B Natriuretic Peptide 285.0 (*)    All other components within normal limits  URINALYSIS, COMPLETE (UACMP) WITH MICROSCOPIC - Abnormal; Notable for the following components:   Color, Urine COLORLESS (*)    APPearance CLEAR (*)    Specific Gravity, Urine 1.004 (*)    Leukocytes,Ua TRACE (*)    All other components within normal limits  TROPONIN I (HIGH SENSITIVITY) - Abnormal; Notable for the following components:   Troponin I (High Sensitivity) 23 (*)    All other components within normal limits  SARS CORONAVIRUS 2 (  TAT 6-24 HRS)  GC/CHLAMYDIA PROBE AMP  RPR  HIV ANTIBODY (ROUTINE TESTING W REFLEX)  TROPONIN I (HIGH SENSITIVITY)   ____________________________________________  EKG  ED ECG REPORT I, [place] Nathen Balaban, the attending physician, personally viewed and interpreted this ECG.   Date: 10/15/2019  EKG Time: 3:33 AM  Rate: 100  Rhythm: Sinus Tachycardia  Axis: Normal  Intervals: Normal  ST&T Change: None  ____________________________________________  RADIOLOGY I, [place] Tawona Filsinger, personally viewed and evaluated these images (plain radiographs) as part of my medical decision making, as well as reviewing the written report by the radiologist.  ED MD interpretation: COPD without acute airspace disease per radiologist on chest x-ray interpretation.  Official radiology report(s): Dg Chest Port 1 View  Result Date: 10/15/2019 CLINICAL DATA:  Dyspnea EXAM: PORTABLE CHEST 1 VIEW COMPARISON:  07/29/2019 FINDINGS: Normal cardiomediastinal contours. Mild diffuse interstitial opacity. Lungs are mildly hyperinflated. No focal airspace consolidation. No pleural effusion or pneumothorax. IMPRESSION: COPD without acute airspace disease. Electronically Signed    By: Deatra RobinsonKevin  Herman M.D.   On: 10/15/2019 04:04      Procedures   ____________________________________________   INITIAL IMPRESSION / MDM / ASSESSMENT AND PLAN / ED COURSE  As part of my medical decision making, I reviewed the following data within the electronic MEDICAL RECORD NUMBER   38 year old male presented with above-stated history and physical exam concerning for COPD exacerbation versus CHF exacerbation.  Chest x-ray consistent with COPD no evidence of pulmonary edema.  Laboratory data notable for a BNP of 285 and troponin of 23 of previous troponin of 28.  Patient currently without any increased work of breathing satting 100% on room air.  During his evaluation the patient requested that we "check for STDs".  Patient does admit to unprotected sex and left testicular discomfort.  STI panel was obtained.  ____________________________________________  FINAL CLINICAL IMPRESSION(S) / ED DIAGNOSES  Final diagnoses:  COPD exacerbation (HCC)     MEDICATIONS GIVEN DURING THIS VISIT:  Medications  nitroGLYCERIN (NITROGLYN) 2 % ointment 1 inch (1 inch Topical Given 10/15/19 0342)  furosemide (LASIX) injection 40 mg (40 mg Intravenous Given 10/15/19 0343)     ED Discharge Orders    None      *Please note:  Arma Headingharles Lewis Woodrome Jr. was evaluated in Emergency Department on 10/15/2019 for the symptoms described in the history of present illness. He was evaluated in the context of the global COVID-19 pandemic, which necessitated consideration that the patient might be at risk for infection with the SARS-CoV-2 virus that causes COVID-19. Institutional protocols and algorithms that pertain to the evaluation of patients at risk for COVID-19 are in a state of rapid change based on information released by regulatory bodies including the CDC and federal and state organizations. These policies and algorithms were followed during the patient's care in the ED.  Some ED evaluations and interventions  may be delayed as a result of limited staffing during the pandemic.*  Note:  This document was prepared using Dragon voice recognition software and may include unintentional dictation errors.   Darci CurrentBrown, [place] N, MD 10/15/19 [phone]

## 2019-10-16 LAB — RPR: RPR Ser Ql: NONREACTIVE

## 2019-10-17 LAB — GC/CHLAMYDIA PROBE AMP
Chlamydia trachomatis, NAA: NEGATIVE
Neisseria Gonorrhoeae by PCR: NEGATIVE

## 2019-11-03 ENCOUNTER — Ambulatory Visit: Payer: Medicare Other | Admitting: Podiatry

## 2019-12-26 ENCOUNTER — Ambulatory Visit: Payer: Medicare Other | Admitting: Podiatry

## 2019-12-29 ENCOUNTER — Other Ambulatory Visit: Payer: Self-pay

## 2019-12-29 ENCOUNTER — Encounter: Payer: Self-pay | Admitting: Podiatry

## 2019-12-29 ENCOUNTER — Ambulatory Visit (INDEPENDENT_AMBULATORY_CARE_PROVIDER_SITE_OTHER): Payer: Medicare Other | Admitting: Podiatry

## 2019-12-29 DIAGNOSIS — B351 Tinea unguium: Secondary | ICD-10-CM

## 2019-12-29 DIAGNOSIS — E119 Type 2 diabetes mellitus without complications: Secondary | ICD-10-CM

## 2019-12-29 DIAGNOSIS — M79674 Pain in right toe(s): Secondary | ICD-10-CM | POA: Diagnosis not present

## 2019-12-29 DIAGNOSIS — M79675 Pain in left toe(s): Secondary | ICD-10-CM | POA: Diagnosis not present

## 2019-12-29 NOTE — Progress Notes (Signed)
Complaint:  Visit Type: Patient returns to my office for continued preventative foot care services. Complaint: Patient states" my nails have grown long and thick and become painful to walk and wear shoes" Patient has been diagnosed with DM with no foot complications. The patient presents for preventative foot care services.  Podiatric Exam: Vascular: dorsalis pedis and posterior tibial pulses are palpable bilateral. Capillary return is immediate. Temperature gradient is WNL. Skin turgor WNL  Sensorium: Normal Semmes Weinstein monofilament test. Normal tactile sensation bilaterally. Nail Exam: Pt has thick disfigured discolored nails with subungual debris noted bilateral  Ulcer Exam: There is no evidence of ulcer or pre-ulcerative changes or infection. Orthopedic Exam: Muscle tone and strength are WNL. No limitations in general ROM. No crepitus or effusions noted. Foot type and digits show no abnormalities. Bony prominences are unremarkable. Skin: No Porokeratosis. No infection or ulcers  Diagnosis:  Onychomycosis, , Pain in right toe, pain in left toes  Treatment & Plan Procedures and Treatment: Consent by patient was obtained for treatment procedures.   Debridement of mycotic and hypertrophic toenails, 1 through 5 bilateral and clearing of subungual debris. No ulceration, no infection noted.  Return Visit-Office Procedure: Patient instructed to return to the office for a follow up visit 3 months for continued evaluation and treatment.    Helane Gunther DPM

## 2020-02-12 ENCOUNTER — Emergency Department
Admission: EM | Admit: 2020-02-12 | Discharge: 2020-02-12 | Disposition: A | Discharge status: Home / Self Care | Payer: Medicare Other | Attending: Student | Admitting: Student

## 2020-02-12 ENCOUNTER — Other Ambulatory Visit: Payer: Self-pay

## 2020-02-12 DIAGNOSIS — J449 Chronic obstructive pulmonary disease, unspecified: Secondary | ICD-10-CM | POA: Diagnosis not present

## 2020-02-12 DIAGNOSIS — E119 Type 2 diabetes mellitus without complications: Secondary | ICD-10-CM | POA: Insufficient documentation

## 2020-02-12 DIAGNOSIS — L732 Hidradenitis suppurativa: Secondary | ICD-10-CM | POA: Diagnosis not present

## 2020-02-12 DIAGNOSIS — I110 Hypertensive heart disease with heart failure: Secondary | ICD-10-CM | POA: Insufficient documentation

## 2020-02-12 DIAGNOSIS — Z7984 Long term (current) use of oral hypoglycemic drugs: Secondary | ICD-10-CM | POA: Insufficient documentation

## 2020-02-12 DIAGNOSIS — F17210 Nicotine dependence, cigarettes, uncomplicated: Secondary | ICD-10-CM | POA: Diagnosis not present

## 2020-02-12 DIAGNOSIS — F1210 Cannabis abuse, uncomplicated: Secondary | ICD-10-CM | POA: Insufficient documentation

## 2020-02-12 DIAGNOSIS — I10 Essential (primary) hypertension: Secondary | ICD-10-CM

## 2020-02-12 DIAGNOSIS — N390 Urinary tract infection, site not specified: Secondary | ICD-10-CM | POA: Insufficient documentation

## 2020-02-12 DIAGNOSIS — Z79899 Other long term (current) drug therapy: Secondary | ICD-10-CM | POA: Insufficient documentation

## 2020-02-12 DIAGNOSIS — I509 Heart failure, unspecified: Secondary | ICD-10-CM | POA: Diagnosis not present

## 2020-02-12 LAB — CBC WITH DIFFERENTIAL/PLATELET
Abs Immature Granulocytes: 0.05 10*3/uL (ref 0.00–0.07)
Basophils Absolute: 0 10*3/uL (ref 0.0–0.1)
Basophils Relative: 0 %
Eosinophils Absolute: 0.1 10*3/uL (ref 0.0–0.5)
Eosinophils Relative: 1 %
HCT: 33.1 % — ABNORMAL LOW (ref 39.0–52.0)
Hemoglobin: 10.8 g/dL — ABNORMAL LOW (ref 13.0–17.0)
Immature Granulocytes: 0 %
Lymphocytes Relative: 26 %
Lymphs Abs: 3.7 10*3/uL (ref 0.7–4.0)
MCH: 26.7 pg (ref 26.0–34.0)
MCHC: 32.6 g/dL (ref 30.0–36.0)
MCV: 81.9 fL (ref 80.0–100.0)
Monocytes Absolute: 0.6 10*3/uL (ref 0.1–1.0)
Monocytes Relative: 5 %
Neutro Abs: 9.6 10*3/uL — ABNORMAL HIGH (ref 1.7–7.7)
Neutrophils Relative %: 68 %
Platelets: 371 10*3/uL (ref 150–400)
RBC: 4.04 MIL/uL — ABNORMAL LOW (ref 4.22–5.81)
RDW: 15 % (ref 11.5–15.5)
WBC: 14.1 10*3/uL — ABNORMAL HIGH (ref 4.0–10.5)
nRBC: 0 % (ref 0.0–0.2)

## 2020-02-12 LAB — URINALYSIS, COMPLETE (UACMP) WITH MICROSCOPIC
Bilirubin Urine: NEGATIVE
Glucose, UA: NEGATIVE mg/dL
Hgb urine dipstick: NEGATIVE
Ketones, ur: NEGATIVE mg/dL
Nitrite: NEGATIVE
Protein, ur: NEGATIVE mg/dL
Specific Gravity, Urine: 1.004 — ABNORMAL LOW (ref 1.005–1.030)
WBC, UA: >50 WBC/hpf — ABNORMAL HIGH (ref 0–5)
pH: 6 (ref 5.0–8.0)

## 2020-02-12 LAB — COMPREHENSIVE METABOLIC PANEL
ALT: 35 U/L (ref 0–44)
AST: 19 U/L (ref 15–41)
Albumin: 3.1 g/dL — ABNORMAL LOW (ref 3.5–5.0)
Alkaline Phosphatase: 81 U/L (ref 38–126)
Anion gap: 7 (ref 5–15)
BUN: 7 mg/dL (ref 6–20)
CO2: 28 mmol/L (ref 22–32)
Calcium: 8.7 mg/dL — ABNORMAL LOW (ref 8.9–10.3)
Chloride: 102 mmol/L (ref 98–111)
Creatinine, Ser: 0.92 mg/dL (ref 0.61–1.24)
GFR calc Af Amer: >60 mL/min (ref >60)
GFR calc non Af Amer: >60 mL/min (ref >60)
Glucose, Bld: 147 mg/dL — ABNORMAL HIGH (ref 70–99)
Potassium: 3.3 mmol/L — ABNORMAL LOW (ref 3.5–5.1)
Sodium: 137 mmol/L (ref 135–145)
Total Bilirubin: 0.5 mg/dL (ref 0.3–1.2)
Total Protein: 9.4 g/dL — ABNORMAL HIGH (ref 6.5–8.1)

## 2020-02-12 LAB — LACTIC ACID, PLASMA: Lactic Acid, Venous: 1.2 mmol/L (ref 0.5–1.9)

## 2020-02-12 MED ORDER — CLINDAMYCIN PHOSPHATE 1 % EX GEL: CUTANEOUS | 0 refills | Status: DC

## 2020-02-12 MED ORDER — CEPHALEXIN 500 MG PO CAPS
500.0000 mg | ORAL_CAPSULE | Freq: Once | ORAL | Status: AC
  Administered 2020-02-12: 500 mg via ORAL
  Filled 2020-02-12: qty 1

## 2020-02-12 MED ORDER — CEPHALEXIN 500 MG PO CAPS: 500.0000 mg | ORAL_CAPSULE | Freq: Four times a day (QID) | ORAL | 0 refills | Status: AC

## 2020-02-12 NOTE — ED Provider Notes (Signed)
[facility] Emergency Department Provider Note  ____________________________________________   First MD Initiated Contact with Patient 02/12/20 1539     (approximate)  I have reviewed the triage vital signs and the nursing notes.  History  Chief Complaint Hypertension and Recurrent Skin Infections    HPI Walter Pena. is a 39 y.o. male with a history of HTN, hidradenitis, CHF, schizophrenia who presents to the emergency department for generalized weakness and acute on chronic hidradenitis.  Patient states for the last week or so he has felt more weak and fatigued than normal.  No localized weakness or focal neurological deficits.  No facial droop or difficulty speaking.  No fevers, vomiting, diarrhea, cough, chest pain, sick contacts, or known COVID exposures.  Does report more strong smelling urine but denies any dysuria.  Also reports recurrence and worsening of his hidradenitis along his gluteal folds and abdominal pannus fold.  Some of these areas are draining spontaneously and are malodorous.  Reports discomfort related to these, primarily along the lesions on his pannus fold.  Sharp and stabbing.  Moderate in severity.  No significant alleviating or aggravating components.  He has not tried anything for this.  Patient states he is also concerned about his blood pressure.  Recently made an appointment with his PCP earlier this week to restart his medications, though sounds like he was not initiated on anything at that visit.  He denies any headache, visual changes, chest pain, loss of consciousness.   Past Medical Hx Past Medical History:  Diagnosis Date  . CHF (congestive heart failure) (HCC)   . COPD (chronic obstructive pulmonary disease) (HCC)   . Gout   . Hidradenitis   . Hypertension   . Schizophrenia [facility])     Problem List Patient Active Problem List   Diagnosis Date Noted  . Pain due to onychomycosis of toenails of both feet  08/04/2019  . Diabetes mellitus without complication (HCC) 08/04/2019  . Acute respiratory failure with hypoxia (HCC) 07/28/2019  . 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 Diabetes (HCC) 02/18/2017  . Schizophrenia (HCC) 02/17/2017  . Alcohol use disorder, moderate, dependence (HCC) 02/17/2017  . Noncompliance 05/07/2016  . HTN (hypertension) 01/21/2016  . Cannabis use disorder, moderate, dependence (HCC) 01/21/2016  . Tobacco use disorder 01/21/2016  . Gout 01/21/2016  . Hidradenitis 01/21/2016    Past Surgical Hx Past Surgical History:  Procedure Laterality Date  . sweat gland removal      Medications Prior to Admission medications   Medication Sig Start Date End Date Taking? Authorizing Provider  albuterol (VENTOLIN HFA) 108 (90 Base) MCG/ACT inhaler Inhale 2 puffs into the lungs every 6 (six) hours as needed for wheezing or shortness of breath. 07/30/19   Enedina Finner, MD  albuterol (VENTOLIN HFA) 108 (90 Base) MCG/ACT inhaler Inhale 2 puffs into the lungs every 6 (six) hours as needed for wheezing or shortness of breath. 10/15/19   Darci Current, MD  allopurinol (ZYLOPRIM) 100 MG tablet Take 100 mg by mouth daily. 01/21/17   [provider]  amLODipine (NORVASC) 10 MG tablet Take 1 tablet (10 mg total) by mouth daily. 07/30/19   Enedina Finner, MD  cephALEXin (KEFLEX) 500 MG capsule Take 1 capsule (500 mg total) by mouth 4 (four) times daily for 10 days. 02/12/20 02/22/20  Miguel Aschoff., MD  clindamycin (CLINDAGEL) 1 % gel Apply to affected area (abdominal region, buttock area of hidradenitis) 2 times daily after cleaning with warm soap and  water and drying completely 02/12/20 02/11/21  Lilia Pro., MD  [facility] SUSTENNA 156 MG/ML SUSY injection  12/23/19   [provider]  metFORMIN (GLUCOPHAGE) 1000 MG tablet Take 1 tablet (1,000 mg total) by mouth 2 (two) times daily. 07/30/19   Fritzi Mandes, MD  olmesartan (BENICAR) 20 MG tablet Take 1 tablet (20 mg  total) by mouth daily. 07/30/19   Fritzi Mandes, MD  predniSONE (DELTASONE) 10 MG tablet Start with 50 mg daily. Taper by 10 mg daily then stop. 07/31/19   Fritzi Mandes, MD  traMADol Veatrice Bourbon) 50 MG tablet  07/30/19   [provider]    Allergies Bactrim [sulfamethoxazole-trimethoprim], Lisinopril, and Other  Family Hx Family History  Problem Relation Age of Onset  . Diabetes Mellitus II Mother   . Hypertension Mother   . Hypertension Father     Social Hx Social History   Tobacco Use  . Smoking status: Current Every Day Smoker    Packs/day: 1.00    Years: 25.00    Pack years: 25.00    Types: Cigarettes  . Smokeless tobacco: Never Used  Substance Use Topics  . Alcohol use: Not Currently    Comment: Socially  . Drug use: Not Currently    Types: Marijuana     Review of Systems  Constitutional: Negative for fever. Negative for chills.  Positive for generalized weakness. Eyes: Negative for visual changes. ENT: Negative for sore throat. Cardiovascular: Negative for chest pain. Respiratory: Negative for shortness of breath. Gastrointestinal: Negative for nausea. Negative for vomiting.  Genitourinary: Negative for dysuria. Musculoskeletal: Negative for leg swelling. Skin: Negative for rash.  Positive for hidradenitis. Neurological: Negative for headaches.   Physical Exam  Vital Signs: ED Triage Vitals  Enc Vitals Group     BP 02/12/20 1440 (!) 164/106     Pulse Rate 02/12/20 1440 90     Resp 02/12/20 1440 18     Temp 02/12/20 1440 98.2 F (36.8 C)     Temp src --      SpO2 02/12/20 1440 98 %     Weight 02/12/20 1443 295 lb (133.8 kg)     Height 02/12/20 1443 5\' 8"  (1.727 m)     Head Circumference --      Peak Flow --      Pain Score 02/12/20 1443 10     Pain Loc --      Pain Edu? --      Excl. in [place]? --     Constitutional: Alert and oriented.  Overweight. Head: Normocephalic. Atraumatic. Eyes: Conjunctivae clear. Sclera anicteric. Pupils equal and  symmetric. Nose: No masses or lesions. No congestion or rhinorrhea. Mouth/Throat: Wearing mask.  Neck: No stridor. Trachea midline.  Cardiovascular: Normal rate, regular rhythm. Extremities well perfused. Respiratory: Normal respiratory effort.  Lungs CTAB. Gastrointestinal: Soft. Non-distended.  Along his pannus fold he has multiple tender, nodular lesions and scarring with some associated malodorous drainage, consistent with hidradenitis.  Tender along this area, remainder of abdomen is soft and nontender. Genitourinary: Deferred. Musculoskeletal: No lower extremity edema. No deformities. Neurologic:  Normal speech and language. No gross focal or lateralizing neurologic deficits are appreciated.  Skin: Multiple tender, nodular lesions with scarring, and some spontaneous malodorous drainage along the gluteal folds, consistent with hidradenitis. Psychiatric: Mood and affect are appropriate for situation.  EKG  N/A    Radiology   N/A   Procedures  Procedure(s) performed (including critical care):  Procedures   Initial Impression / Assessment and  Plan / MDM / ED Course  39 y.o. male who presents to the ED for generalized weakness and fatigue, as well as acute on chronic hidradenitis  Ddx: anemia, infection, electrolyte abnormality, acute on chronic hidradenitis.  BP 164/106 on arrival in the ED, no evidence of end organ damage on labs or by physical exam, do not suspect hypertensive emergency or urgency at this time.  Will plan for labs, urine studies  Work-up reveals WBC count 14.1, however this seems consistent with his priors and stable.  Hemoglobin 10.8, consistent with prior, no acute anemia.  Electrolytes without actionable derangements.  UA suggestive of infection.  Final assessment: UTI, acute on chronic hidradenitis, asymptomatic hypertension.  We will plan for oral antibiotics for UTI, topical clindamycin for hidradenitis + discussed appropriate skin care, advise  follow-up with PCP for further blood pressure management, recheck, and potential referral (dermatology or plastics) for further hidradenitis management if continues to be symptomatic.  Patient voices understanding and is comfortable to plan and discharge.   _______________________________   As part of my medical decision making I have reviewed available labs, radiology tests, reviewed old records.  Final Clinical Impression(s) / ED Diagnosis  Final diagnoses:  Hypertension, unspecified type  Urinary tract infection in male  Hidradenitis suppurativa       Note:  This document was prepared using Dragon voice recognition software and may include unintentional dictation errors.   Miguel Aschoff., MD 02/12/20 [phone]

## 2020-02-12 NOTE — ED Triage Notes (Signed)
Pt arrives to ed via pov with c/o hypertension. Pt states takes medications but has not taken meds in over a week.  Pt also reports "boils" on buttocks, pt reports having this problem in past. But has also developed skin irritation under abd skin fold. Pt a&o x4. On visual assessment pt has multiple open sores/wounds on lower abd. Pt ambulatory to triage NAD noted at this time.

## 2020-02-12 NOTE — Discharge Instructions (Addendum)
Thank you for letting us take care of you in the emergency department today.  Your work-up today shows you have a urine infection and hidradenitis.  New medications we have prescribed:  Keflex, for urine infection Clindamycin, antibiotic ointment for your areas of hidradenitis  Please follow up with: Your primary care doctor to review your ER visit and follow up on your symptoms. Sometimes patients with hidradenitis need referral to dermatology or plastic surgery doctors for further management and treatment. You should also talk to your doctor about getting started on a blood pressure medication.   Please return to the ER for any new or worsening symptoms.

## 2020-03-12 ENCOUNTER — Ambulatory Visit: Payer: Medicare Other | Admitting: Podiatry

## 2020-03-26 ENCOUNTER — Emergency Department: Payer: Medicare Other

## 2020-03-26 ENCOUNTER — Encounter: Payer: Self-pay | Admitting: Emergency Medicine

## 2020-03-26 ENCOUNTER — Emergency Department
Admission: EM | Admit: 2020-03-26 | Discharge: 2020-03-27 | Disposition: A | Discharge status: Home / Self Care | Payer: Medicare Other | Attending: Emergency Medicine | Admitting: Emergency Medicine

## 2020-03-26 ENCOUNTER — Other Ambulatory Visit: Payer: Self-pay

## 2020-03-26 DIAGNOSIS — Z79899 Other long term (current) drug therapy: Secondary | ICD-10-CM | POA: Insufficient documentation

## 2020-03-26 DIAGNOSIS — F17210 Nicotine dependence, cigarettes, uncomplicated: Secondary | ICD-10-CM | POA: Insufficient documentation

## 2020-03-26 DIAGNOSIS — Z20822 Contact with and (suspected) exposure to covid-19: Secondary | ICD-10-CM | POA: Insufficient documentation

## 2020-03-26 DIAGNOSIS — I509 Heart failure, unspecified: Secondary | ICD-10-CM | POA: Diagnosis not present

## 2020-03-26 DIAGNOSIS — E119 Type 2 diabetes mellitus without complications: Secondary | ICD-10-CM | POA: Insufficient documentation

## 2020-03-26 DIAGNOSIS — R509 Fever, unspecified: Secondary | ICD-10-CM | POA: Insufficient documentation

## 2020-03-26 DIAGNOSIS — J441 Chronic obstructive pulmonary disease with (acute) exacerbation: Secondary | ICD-10-CM | POA: Insufficient documentation

## 2020-03-26 DIAGNOSIS — I110 Hypertensive heart disease with heart failure: Secondary | ICD-10-CM | POA: Diagnosis not present

## 2020-03-26 DIAGNOSIS — Z7984 Long term (current) use of oral hypoglycemic drugs: Secondary | ICD-10-CM | POA: Diagnosis not present

## 2020-03-26 DIAGNOSIS — R0602 Shortness of breath: Secondary | ICD-10-CM | POA: Diagnosis present

## 2020-03-26 LAB — COMPREHENSIVE METABOLIC PANEL
ALT: 34 U/L (ref 0–44)
AST: 32 U/L (ref 15–41)
Albumin: 3.3 g/dL — ABNORMAL LOW (ref 3.5–5.0)
Alkaline Phosphatase: 69 U/L (ref 38–126)
Anion gap: 8 (ref 5–15)
BUN: 11 mg/dL (ref 6–20)
CO2: 25 mmol/L (ref 22–32)
Calcium: 8.4 mg/dL — ABNORMAL LOW (ref 8.9–10.3)
Chloride: 102 mmol/L (ref 98–111)
Creatinine, Ser: 1.03 mg/dL (ref 0.61–1.24)
GFR calc Af Amer: >60 mL/min (ref >60)
GFR calc non Af Amer: >60 mL/min (ref >60)
Glucose, Bld: 145 mg/dL — ABNORMAL HIGH (ref 70–99)
Potassium: 3.3 mmol/L — ABNORMAL LOW (ref 3.5–5.1)
Sodium: 135 mmol/L (ref 135–145)
Total Bilirubin: 0.9 mg/dL (ref 0.3–1.2)
Total Protein: 8.9 g/dL — ABNORMAL HIGH (ref 6.5–8.1)

## 2020-03-26 LAB — CBC WITH DIFFERENTIAL/PLATELET
Abs Immature Granulocytes: 0.05 10*3/uL (ref 0.00–0.07)
Basophils Absolute: 0 10*3/uL (ref 0.0–0.1)
Basophils Relative: 0 %
Eosinophils Absolute: 0 10*3/uL (ref 0.0–0.5)
Eosinophils Relative: 0 %
HCT: 32.5 % — ABNORMAL LOW (ref 39.0–52.0)
Hemoglobin: 10.4 g/dL — ABNORMAL LOW (ref 13.0–17.0)
Immature Granulocytes: 0 %
Lymphocytes Relative: 19 %
Lymphs Abs: 2.8 10*3/uL (ref 0.7–4.0)
MCH: 26.1 pg (ref 26.0–34.0)
MCHC: 32 g/dL (ref 30.0–36.0)
MCV: 81.5 fL (ref 80.0–100.0)
Monocytes Absolute: 0.9 10*3/uL (ref 0.1–1.0)
Monocytes Relative: 6 %
Neutro Abs: 10.6 10*3/uL — ABNORMAL HIGH (ref 1.7–7.7)
Neutrophils Relative %: 75 %
Platelets: 358 10*3/uL (ref 150–400)
RBC: 3.99 MIL/uL — ABNORMAL LOW (ref 4.22–5.81)
RDW: 15.7 % — ABNORMAL HIGH (ref 11.5–15.5)
WBC: 14.3 10*3/uL — ABNORMAL HIGH (ref 4.0–10.5)
nRBC: 0 % (ref 0.0–0.2)

## 2020-03-26 LAB — SARS CORONAVIRUS 2 BY RT PCR (HOSPITAL ORDER, PERFORMED IN ~~LOC~~ HOSPITAL LAB): SARS Coronavirus 2: NEGATIVE

## 2020-03-26 MED ORDER — IPRATROPIUM-ALBUTEROL 0.5-2.5 (3) MG/3ML IN SOLN
3.0000 mL | Freq: Once | RESPIRATORY_TRACT | Status: AC
  Administered 2020-03-26: 3 mL via RESPIRATORY_TRACT

## 2020-03-26 MED ORDER — IPRATROPIUM-ALBUTEROL 0.5-2.5 (3) MG/3ML IN SOLN
3.0000 mL | Freq: Once | RESPIRATORY_TRACT | Status: AC
  Administered 2020-03-26: 3 mL via RESPIRATORY_TRACT
  Filled 2020-03-26: qty 9

## 2020-03-26 MED ORDER — PREDNISONE 20 MG PO TABS: 40.0000 mg | ORAL_TABLET | Freq: Every day | ORAL | 0 refills | Status: DC

## 2020-03-26 MED ORDER — IPRATROPIUM-ALBUTEROL 0.5-2.5 (3) MG/3ML IN SOLN
RESPIRATORY_TRACT | Status: AC
  Filled 2020-03-26: qty 6

## 2020-03-26 MED ORDER — METHYLPREDNISOLONE SODIUM SUCC 125 MG IJ SOLR: 125.0000 mg | Freq: Once | INTRAMUSCULAR | Status: DC

## 2020-03-26 MED ORDER — AZITHROMYCIN 250 MG PO TABS: ORAL_TABLET | ORAL | 0 refills | Status: AC

## 2020-03-26 MED ORDER — ALBUTEROL SULFATE HFA 108 (90 BASE) MCG/ACT IN AERS: 2.0000 | INHALATION_SPRAY | Freq: Four times a day (QID) | RESPIRATORY_TRACT | 0 refills | Status: DC | PRN

## 2020-03-26 NOTE — Discharge Instructions (Signed)
Please seek medical attention for any high fevers, chest pain, shortness of breath, change in behavior, persistent vomiting, bloody stool or any other new or concerning symptoms.  

## 2020-03-26 NOTE — ED Provider Notes (Signed)
[facility] Emergency Department Provider Note  ____________________________________________   I have reviewed the triage vital signs and the nursing notes.   HISTORY  Chief Complaint Shortness of Breath   History limited by: Not Limited   HPI Walter Pena. is a 39 y.o. male who presents to the emergency department today because of concerns for shortness of breath.  He states his symptoms have been present for the past couple of days.  He has also had slight fevers.  Patient has had cough.  It has been productive but he is swallowed it so is unsure if any blood was in it.  Patient denies any nausea or vomiting.  States he does have a history of COPD although has been out of his inhaler.  He also [place] around a lot of people and he has not yet received his Covid vaccine.    Records reviewed. Per medical record review patient has a history of CHF, COPD.   Past Medical History:  Diagnosis Date  . CHF (congestive heart failure) (HCC)   . COPD (chronic obstructive pulmonary disease) (HCC)   . Gout   . Hidradenitis   . Hypertension   . Schizophrenia [facility])     Patient Active Problem List   Diagnosis Date Noted  . Pain due to onychomycosis of toenails of both feet 08/04/2019  . Diabetes mellitus without complication (HCC) 08/04/2019  . Acute respiratory failure with hypoxia (HCC) 07/28/2019  . 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 Diabetes (HCC) 02/18/2017  . Schizophrenia (HCC) 02/17/2017  . Alcohol use disorder, moderate, dependence (HCC) 02/17/2017  . Noncompliance 05/07/2016  . HTN (hypertension) 01/21/2016  . Cannabis use disorder, moderate, dependence (HCC) 01/21/2016  . Tobacco use disorder 01/21/2016  . Gout 01/21/2016  . Hidradenitis 01/21/2016    Past Surgical History:  Procedure Laterality Date  . sweat gland removal      Prior to Admission medications    Medication Sig Start Date End Date Taking? Authorizing Provider  albuterol (VENTOLIN HFA) 108 (90 Base) MCG/ACT inhaler Inhale 2 puffs into the lungs every 6 (six) hours as needed for wheezing or shortness of breath. 07/30/19   Enedina Finner, MD  albuterol (VENTOLIN HFA) 108 (90 Base) MCG/ACT inhaler Inhale 2 puffs into the lungs every 6 (six) hours as needed for wheezing or shortness of breath. 10/15/19   Darci Current, MD  allopurinol (ZYLOPRIM) 100 MG tablet Take 100 mg by mouth daily. 01/21/17   [provider]  amLODipine (NORVASC) 10 MG tablet Take 1 tablet (10 mg total) by mouth daily. 07/30/19   Enedina Finner, MD  clindamycin (CLINDAGEL) 1 % gel Apply to affected area (abdominal region, buttock area of hidradenitis) 2 times daily after cleaning with warm soap and water and drying completely 02/12/20 02/11/21  Miguel Aschoff., MD  [facility] SUSTENNA 156 MG/ML SUSY injection  12/23/19   [provider]  metFORMIN (GLUCOPHAGE) 1000 MG tablet Take 1 tablet (1,000 mg total) by mouth 2 (two) times daily. 07/30/19   Enedina Finner, MD  olmesartan (BENICAR) 20 MG tablet Take 1 tablet (20 mg total) by mouth daily. 07/30/19   Enedina Finner, MD  predniSONE (DELTASONE) 10 MG tablet Start with 50 mg daily. Taper by 10 mg daily then stop. 07/31/19   Enedina Finner, MD  traMADol Janean Sark) 50 MG tablet  07/30/19   [provider]    Allergies Bactrim [sulfamethoxazole-trimethoprim], Lisinopril, and  Other  Family History  Problem Relation Age of Onset  . Diabetes Mellitus II Mother   . Hypertension Mother   . Hypertension Father     Social History Social History   Tobacco Use  . Smoking status: Current Every Day Smoker    Packs/day: 1.00    Years: 25.00    Pack years: 25.00    Types: Cigarettes  . Smokeless tobacco: Never Used  Substance Use Topics  . Alcohol use: Not Currently    Comment: Socially  . Drug use: Not Currently    Types: Marijuana    Review of  Systems Constitutional: Positive for fever. Eyes: No visual changes. ENT: No sore throat. Cardiovascular: Denies chest pain. Respiratory: Positive for shortness of breath. Positive for cough. Gastrointestinal: No abdominal pain.  No nausea, no vomiting.  No diarrhea.   Genitourinary: Negative for dysuria. Musculoskeletal: Negative for back pain. Skin: Negative for rash. Neurological: Negative for headaches, focal weakness or numbness.  ____________________________________________   PHYSICAL EXAM:  VITAL SIGNS: ED Triage Vitals  Enc Vitals Group     BP 03/26/20 2000 (!) 177/104     Pulse Rate 03/26/20 2000 (!) 120     Resp 03/26/20 2000 (!) 43     Temp 03/26/20 1949 (!) 100.8 F (38.2 C)     Temp Source 03/26/20 1949 Oral     SpO2 03/26/20 2000 94 %     Weight 03/26/20 2012 294 lb 15.6 oz (133.8 kg)     Height 03/26/20 2012 6' (1.829 m)     Head Circumference --      Peak Flow --      Pain Score 03/26/20 2012 5    Constitutional: Alert and oriented.  Eyes: Conjunctivae are normal.  ENT      Head: Normocephalic and atraumatic.      Nose: No congestion/rhinnorhea.      Mouth/Throat: Mucous membranes are moist.      Neck: No stridor. Hematological/Lymphatic/Immunilogical: No cervical lymphadenopathy. Cardiovascular: Tachycardia, regular rhythm.  No murmurs, rubs, or gallops.  Respiratory: Slightly increased respiratory effort with diffuse expiratory wheezing.  Gastrointestinal: Soft and non tender. No rebound. No guarding.  Genitourinary: Deferred Musculoskeletal: Normal range of motion in all extremities. No lower extremity edema. Neurologic:  Normal speech and language. No gross focal neurologic deficits are appreciated.  Skin:  Skin is warm, dry and intact. No rash noted. Psychiatric: Mood and affect are normal. Speech and behavior are normal. Patient exhibits appropriate insight and judgment.  ____________________________________________    LABS (pertinent  positives/negatives)  CBC wbc 14.3, hgb 10.4, plt 358 CMP na 135, k 3.3, glu 145, cr 1.03  ____________________________________________   EKG  I, Phineas Semen, attending physician, personally viewed and interpreted this EKG  EKG Time: 1952 Rate: 120 Rhythm: sinus tachycardia Axis: left axis deviation Intervals: qtc 481 QRS: narrow ST changes: no st elevation Impression: abnormal ekg  ____________________________________________    RADIOLOGY  CXR Borderline cardiomegaly and bronchitic changes  ____________________________________________   PROCEDURES  Procedures  ____________________________________________   INITIAL IMPRESSION / ASSESSMENT AND PLAN / ED COURSE  Pertinent labs & imaging results that were available during my care of the patient were reviewed by me and considered in my medical decision making (see chart for details).   Patient presented to the emergent concerns for shortness of breath over the past few days.  Patient does have a history of COPD.  Did have some concern for Covid given slight fever, however patient tested negative. He  did feel better after solumedrol and breathing treatments. If continued improvement will plan on discharging with steroids, abx and albuterol treatment.    ____________________________________________   FINAL CLINICAL IMPRESSION(S) / ED DIAGNOSES  Final diagnoses:  COPD exacerbation ([place])     Note: This dictation was prepared with Dragon dictation. Any transcriptional errors that result from this process are unintentional     Nance Pear, MD 03/26/20 2320

## 2020-03-26 NOTE — ED Triage Notes (Signed)
Pt presents via [facility] with c/o shortness of breath, cough and fever that began 2 days ago. Pt has hx of copd. 93% room air saturation for ems. Pt respirations currently labored at this time. Temp 102 for ems. Pt with expiratory wheezes upon arrival. Pt given 1g tylenol and 125 solumedrol in route by ems.

## 2020-03-27 NOTE — ED Provider Notes (Signed)
-----------------------------------------   12:09 AM on 03/27/2020 -----------------------------------------  Patient feels significantly better after DuoNeb's and is eager for discharge home. No wheezing auscultated on exam; slightly diminished lung sounds.  Will discharge home per previous provider's plan.  Strict return precautions given.  Patient verbalizes understanding and agrees with plan of care.   Irean Hong, MD 03/27/20 [phone]

## 2020-04-14 ENCOUNTER — Emergency Department: Payer: Medicare Other

## 2020-04-14 ENCOUNTER — Other Ambulatory Visit: Payer: Self-pay

## 2020-04-14 DIAGNOSIS — Z882 Allergy status to sulfonamides status: Secondary | ICD-10-CM | POA: Insufficient documentation

## 2020-04-14 DIAGNOSIS — I110 Hypertensive heart disease with heart failure: Secondary | ICD-10-CM | POA: Diagnosis not present

## 2020-04-14 DIAGNOSIS — M79672 Pain in left foot: Secondary | ICD-10-CM | POA: Diagnosis not present

## 2020-04-14 DIAGNOSIS — R1012 Left upper quadrant pain: Secondary | ICD-10-CM | POA: Insufficient documentation

## 2020-04-14 DIAGNOSIS — F17210 Nicotine dependence, cigarettes, uncomplicated: Secondary | ICD-10-CM | POA: Diagnosis not present

## 2020-04-14 DIAGNOSIS — Z888 Allergy status to other drugs, medicaments and biological substances status: Secondary | ICD-10-CM | POA: Insufficient documentation

## 2020-04-14 DIAGNOSIS — Z79899 Other long term (current) drug therapy: Secondary | ICD-10-CM | POA: Insufficient documentation

## 2020-04-14 DIAGNOSIS — I509 Heart failure, unspecified: Secondary | ICD-10-CM | POA: Diagnosis not present

## 2020-04-14 DIAGNOSIS — Z7984 Long term (current) use of oral hypoglycemic drugs: Secondary | ICD-10-CM | POA: Insufficient documentation

## 2020-04-14 DIAGNOSIS — J449 Chronic obstructive pulmonary disease, unspecified: Secondary | ICD-10-CM | POA: Insufficient documentation

## 2020-04-14 DIAGNOSIS — E119 Type 2 diabetes mellitus without complications: Secondary | ICD-10-CM | POA: Insufficient documentation

## 2020-04-14 LAB — COMPREHENSIVE METABOLIC PANEL
ALT: 42 U/L (ref 0–44)
AST: 25 U/L (ref 15–41)
Albumin: 3.1 g/dL — ABNORMAL LOW (ref 3.5–5.0)
Alkaline Phosphatase: 74 U/L (ref 38–126)
Anion gap: 9 (ref 5–15)
BUN: 10 mg/dL (ref 6–20)
CO2: 24 mmol/L (ref 22–32)
Calcium: 8.4 mg/dL — ABNORMAL LOW (ref 8.9–10.3)
Chloride: 103 mmol/L (ref 98–111)
Creatinine, Ser: 0.93 mg/dL (ref 0.61–1.24)
GFR calc Af Amer: >60 mL/min (ref >60)
GFR calc non Af Amer: >60 mL/min (ref >60)
Glucose, Bld: 192 mg/dL — ABNORMAL HIGH (ref 70–99)
Potassium: 3.3 mmol/L — ABNORMAL LOW (ref 3.5–5.1)
Sodium: 136 mmol/L (ref 135–145)
Total Bilirubin: 0.8 mg/dL (ref 0.3–1.2)
Total Protein: 8.2 g/dL — ABNORMAL HIGH (ref 6.5–8.1)

## 2020-04-14 LAB — CBC
HCT: 32.1 % — ABNORMAL LOW (ref 39.0–52.0)
Hemoglobin: 10.3 g/dL — ABNORMAL LOW (ref 13.0–17.0)
MCH: 26.3 pg (ref 26.0–34.0)
MCHC: 32.1 g/dL (ref 30.0–36.0)
MCV: 82.1 fL (ref 80.0–100.0)
Platelets: 308 10*3/uL (ref 150–400)
RBC: 3.91 MIL/uL — ABNORMAL LOW (ref 4.22–5.81)
RDW: 15.9 % — ABNORMAL HIGH (ref 11.5–15.5)
WBC: 14.4 10*3/uL — ABNORMAL HIGH (ref 4.0–10.5)
nRBC: 0.2 % (ref 0.0–0.2)

## 2020-04-14 LAB — LIPASE, BLOOD: Lipase: 36 U/L (ref 11–51)

## 2020-04-14 NOTE — ED Triage Notes (Signed)
Pt comes POV with left foot pain after stepping on a piece of bark. Bark went into the foot. Pt also has left sided abdominal pain that started 3 days ago. Pt concerned that the foot is infected.

## 2020-04-15 ENCOUNTER — Emergency Department
Admission: EM | Admit: 2020-04-15 | Discharge: 2020-04-15 | Disposition: A | Discharge status: Home / Self Care | Payer: Medicare Other | Source: Home / Self Care | Attending: Emergency Medicine | Admitting: Emergency Medicine

## 2020-04-15 ENCOUNTER — Encounter: Payer: Self-pay | Admitting: Emergency Medicine

## 2020-04-15 ENCOUNTER — Emergency Department
Admission: EM | Admit: 2020-04-15 | Discharge: 2020-04-15 | Disposition: A | Discharge status: Home / Self Care | Payer: Medicare Other | Attending: Emergency Medicine | Admitting: Emergency Medicine

## 2020-04-15 ENCOUNTER — Emergency Department: Payer: Medicare Other

## 2020-04-15 DIAGNOSIS — R1012 Left upper quadrant pain: Secondary | ICD-10-CM | POA: Diagnosis not present

## 2020-04-15 DIAGNOSIS — R0602 Shortness of breath: Secondary | ICD-10-CM

## 2020-04-15 DIAGNOSIS — K2970 Gastritis, unspecified, without bleeding: Secondary | ICD-10-CM

## 2020-04-15 DIAGNOSIS — S91332A Puncture wound without foreign body, left foot, initial encounter: Secondary | ICD-10-CM

## 2020-04-15 LAB — URINALYSIS, COMPLETE (UACMP) WITH MICROSCOPIC
Bacteria, UA: NONE SEEN
Bilirubin Urine: NEGATIVE
Glucose, UA: NEGATIVE mg/dL
Hgb urine dipstick: NEGATIVE
Ketones, ur: NEGATIVE mg/dL
Nitrite: NEGATIVE
Protein, ur: NEGATIVE mg/dL
Specific Gravity, Urine: 1.008 (ref 1.005–1.030)
Squamous Epithelial / HPF: NONE SEEN (ref 0–5)
pH: 7 (ref 5.0–8.0)

## 2020-04-15 MED ORDER — DOXYCYCLINE HYCLATE 100 MG PO CAPS
100.0000 mg | ORAL_CAPSULE | Freq: Two times a day (BID) | ORAL | 0 refills | Status: DC
Start: 2020-04-15 — End: 2020-04-23

## 2020-04-15 MED ORDER — IPRATROPIUM-ALBUTEROL 0.5-2.5 (3) MG/3ML IN SOLN
3.0000 mL | Freq: Once | RESPIRATORY_TRACT | Status: AC
  Administered 2020-04-15: 3 mL via RESPIRATORY_TRACT
  Filled 2020-04-15: qty 3

## 2020-04-15 MED ORDER — LIDOCAINE VISCOUS HCL 2 % MT SOLN
15.0000 mL | Freq: Once | OROMUCOSAL | Status: AC
  Administered 2020-04-15: 15 mL via OROMUCOSAL
  Filled 2020-04-15: qty 15

## 2020-04-15 MED ORDER — FAMOTIDINE 20 MG PO TABS
20.0000 mg | ORAL_TABLET | Freq: Every day | ORAL | 1 refills | Status: DC
Start: 2020-04-15 — End: 2024-09-29

## 2020-04-15 NOTE — Discharge Instructions (Addendum)
Please seek medical attention for any high fevers, chest pain, shortness of breath, change in behavior, persistent vomiting, bloody stool or any other new or concerning symptoms.  

## 2020-04-15 NOTE — ED Provider Notes (Signed)
[facility] Emergency Department Provider Note   ____________________________________________   I have reviewed the triage vital signs and the nursing notes.   HISTORY  Chief Complaint Abdominal pain  History limited by: Not Limited   HPI Walter Pena. is a 39 y.o. male who presents to the emergency department today with primary concern for abdominal pain. States it has been present for the past few days. Located in the upper center and left abdomen. It is worse with coughing. The patient denies any vomiting. Denies similar pain in the past. Does feel like he has had increased coughing over the past couple of days and has secondary complaint of some shortness of breath. Does wonder if he should cut back on smoking. His third complaint is of left foot pain. Says that he stepped on a piece of bark a couple of days ago. Since then has had pain in his left foot. Felt like he did get all of the bark out.    Records reviewed. Per medical record review patient has a history of COPD, HTN, CHF.  Past Medical History:  Diagnosis Date  . CHF (congestive heart failure) (HCC)   . COPD (chronic obstructive pulmonary disease) (HCC)   . Gout   . Hidradenitis   . Hypertension   . Schizophrenia [facility])     Patient Active Problem List   Diagnosis Date Noted  . Pain due to onychomycosis of toenails of both feet 08/04/2019  . Diabetes mellitus without complication (HCC) 08/04/2019  . Acute respiratory failure with hypoxia (HCC) 07/28/2019  . 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 Diabetes (HCC) 02/18/2017  . Schizophrenia (HCC) 02/17/2017  . Alcohol use disorder, moderate, dependence (HCC) 02/17/2017  . Noncompliance 05/07/2016  . HTN (hypertension) 01/21/2016  . Cannabis use disorder, moderate, dependence (HCC) 01/21/2016  . Tobacco use disorder 01/21/2016  . Gout 01/21/2016  . Hidradenitis 01/21/2016    Past Surgical History:  Procedure  Laterality Date  . sweat gland removal      Prior to Admission medications   Medication Sig Start Date End Date Taking? Authorizing Provider  albuterol (VENTOLIN HFA) 108 (90 Base) MCG/ACT inhaler Inhale 2 puffs into the lungs every 6 (six) hours as needed for wheezing or shortness of breath. 07/30/19   Enedina Finner, MD  albuterol (VENTOLIN HFA) 108 (90 Base) MCG/ACT inhaler Inhale 2 puffs into the lungs every 6 (six) hours as needed for wheezing or shortness of breath. 10/15/19   Darci Current, MD  albuterol (VENTOLIN HFA) 108 (90 Base) MCG/ACT inhaler Inhale 2 puffs into the lungs every 6 (six) hours as needed for wheezing or shortness of breath. 03/26/20   Phineas Semen, MD  allopurinol (ZYLOPRIM) 100 MG tablet Take 100 mg by mouth daily. 01/21/17   [provider]  amLODipine (NORVASC) 10 MG tablet Take 1 tablet (10 mg total) by mouth daily. 07/30/19   Enedina Finner, MD  clindamycin (CLINDAGEL) 1 % gel Apply to affected area (abdominal region, buttock area of hidradenitis) 2 times daily after cleaning with warm soap and water and drying completely 02/12/20 02/11/21  Miguel Aschoff., MD  [facility] SUSTENNA 156 MG/ML SUSY injection  12/23/19   [provider]  metFORMIN (GLUCOPHAGE) 1000 MG tablet Take 1 tablet (1,000 mg total) by mouth 2 (two) times daily. 07/30/19   Enedina Finner, MD  olmesartan (BENICAR) 20 MG tablet Take 1 tablet (20 mg total) by mouth daily. 07/30/19   Enedina Finner, MD  predniSONE (DELTASONE) 10  MG tablet Start with 50 mg daily. Taper by 10 mg daily then stop. 07/31/19   Enedina Finner, MD  predniSONE (DELTASONE) 20 MG tablet Take 2 tablets (40 mg total) by mouth daily. 03/26/20   Phineas Semen, MD  traMADol Janean Sark) 50 MG tablet  07/30/19   [provider]    Allergies Bactrim [sulfamethoxazole-trimethoprim], Lisinopril, and Other  Family History  Problem Relation Age of Onset  . Diabetes Mellitus II Mother   . Hypertension Mother   . Hypertension Father      Social History Social History   Tobacco Use  . Smoking status: Current Every Day Smoker    Packs/day: 1.00    Years: 25.00    Pack years: 25.00    Types: Cigarettes  . Smokeless tobacco: Never Used  Substance Use Topics  . Alcohol use: Not Currently    Comment: Socially  . Drug use: Not Currently    Types: Marijuana    Review of Systems Constitutional: No fever/chills Eyes: No visual changes. ENT: No sore throat. Cardiovascular: Denies chest pain. Respiratory: Positive for cough and shortness of breath. Gastrointestinal: Positive for abdominal pain. Genitourinary: Negative for dysuria. Musculoskeletal: Positive for left foot pain. Skin: Negative for rash. Neurological: Negative for headaches, focal weakness or numbness.  ____________________________________________   PHYSICAL EXAM:  VITAL SIGNS: ED Triage Vitals  Enc Vitals Group     BP 04/15/20 0806 (!) 158/98     Pulse Rate 04/15/20 0806 (!) 101     Resp 04/15/20 0806 18     Temp 04/15/20 0806 99.2 F (37.3 C)     Temp Source 04/15/20 0806 Oral     SpO2 04/15/20 0806 96 %     Weight 04/15/20 0807 300 lb (136.1 kg)     Height 04/15/20 0807 5\' 8"  (1.727 m)     Head Circumference --      Peak Flow --      Pain Score 04/15/20 0807 10   Constitutional: Alert and oriented.  Eyes: Conjunctivae are normal.  ENT      Head: Normocephalic and atraumatic.      Nose: No congestion/rhinnorhea.      Mouth/Throat: Mucous membranes are moist.      Neck: No stridor. Hematological/Lymphatic/Immunilogical: No cervical lymphadenopathy. Cardiovascular: Normal rate, regular rhythm.  No murmurs, rubs, or gallops. Respiratory: Normal respiratory effort without tachypnea nor retractions. Breath sounds are clear and equal bilaterally. No wheezes/rales/rhonchi. Gastrointestinal: Soft and non tender. No rebound. No guarding.  Genitourinary: Deferred Musculoskeletal: Normal range of motion in all extremities. No lower  extremity edema. Neurologic:  Normal speech and language. No gross focal neurologic deficits are appreciated.  Skin:  Skin is warm, dry and intact. No rash noted. Psychiatric: Mood and affect are normal. Speech and behavior are normal. Patient exhibits appropriate insight and judgment.  ____________________________________________    LABS (pertinent positives/negatives)  Labs drawn yesterday evening reviewed  ____________________________________________   EKG  None  ____________________________________________    RADIOLOGY  Left foot No FB. No fracture. ____________________________________________   PROCEDURES  Procedures  ____________________________________________   INITIAL IMPRESSION / ASSESSMENT AND PLAN / ED COURSE  Pertinent labs & imaging results that were available during my care of the patient were reviewed by me and considered in my medical decision making (see chart for details).   Patient presented to the emergency department today with primary complaint of abdominal pain.  Located in the center left upper abdomen.  On my exam patient without any abdominal tenderness.  He states that the viscous lidocaine did help with his discomfort.  This point I think gastritis likely.  Additionally had complaints of left foot wound. Bedside US and x-ray without FB. No overt signs of infection at this time. No collection of fluid appreciated on [place]. Out of an abundance of caution given history of DM will start on antibiotics.   ____________________________________________   FINAL CLINICAL IMPRESSION(S) / ED DIAGNOSES  Final diagnoses:  Gastritis, presence of bleeding unspecified, unspecified chronicity, unspecified gastritis type  Puncture wound of left foot, initial encounter  Shortness of breath     Note: This dictation was prepared with Dragon dictation. Any transcriptional errors that result from this process are unintentional     Nance Pear,  MD 04/15/20 1120

## 2020-04-15 NOTE — ED Notes (Signed)
FIRST NURSE NOTE:  Pt here yesterday for left abdominal pain and L foot infection.  Pt LWBS due to wait time.

## 2020-04-15 NOTE — ED Notes (Signed)
Pt verbalized understanding of discharge instructions. NAD at this time. 

## 2020-04-15 NOTE — ED Triage Notes (Signed)
PT states was here last night and left before being seen.  Pt states main concern is the "hole" in his foot.  Pt reports being diabetic and is afraid the wound may get infected.  PT also reports being [facility] and thinks he needs a breathing treatment for his COPD.  PT reports labs drawn last night.

## 2020-04-16 LAB — URINE CULTURE: Culture: NO GROWTH

## 2020-04-21 ENCOUNTER — Emergency Department: Payer: Medicare Other

## 2020-04-21 ENCOUNTER — Inpatient Hospital Stay: Payer: Medicare Other

## 2020-04-21 ENCOUNTER — Other Ambulatory Visit: Payer: Self-pay

## 2020-04-21 ENCOUNTER — Encounter: Payer: Self-pay | Admitting: Internal Medicine

## 2020-04-21 ENCOUNTER — Inpatient Hospital Stay
Admission: EM | Admit: 2020-04-21 | Discharge: 2020-04-23 | DRG: 603 | Disposition: A | Discharge status: Home Health | Payer: Medicare Other | Source: Ambulatory Visit | Attending: Internal Medicine | Admitting: Internal Medicine

## 2020-04-21 DIAGNOSIS — F17210 Nicotine dependence, cigarettes, uncomplicated: Secondary | ICD-10-CM | POA: Diagnosis present

## 2020-04-21 DIAGNOSIS — I110 Hypertensive heart disease with heart failure: Secondary | ICD-10-CM | POA: Diagnosis present

## 2020-04-21 DIAGNOSIS — L0390 Cellulitis, unspecified: Secondary | ICD-10-CM | POA: Diagnosis present

## 2020-04-21 DIAGNOSIS — E1165 Type 2 diabetes mellitus with hyperglycemia: Secondary | ICD-10-CM | POA: Diagnosis not present

## 2020-04-21 DIAGNOSIS — J449 Chronic obstructive pulmonary disease, unspecified: Secondary | ICD-10-CM | POA: Diagnosis present

## 2020-04-21 DIAGNOSIS — E119 Type 2 diabetes mellitus without complications: Secondary | ICD-10-CM | POA: Diagnosis present

## 2020-04-21 DIAGNOSIS — F209 Schizophrenia, unspecified: Secondary | ICD-10-CM | POA: Diagnosis present

## 2020-04-21 DIAGNOSIS — M109 Gout, unspecified: Secondary | ICD-10-CM | POA: Diagnosis present

## 2020-04-21 DIAGNOSIS — W458XXA Other foreign body or object entering through skin, initial encounter: Secondary | ICD-10-CM | POA: Diagnosis present

## 2020-04-21 DIAGNOSIS — Z833 Family history of diabetes mellitus: Secondary | ICD-10-CM

## 2020-04-21 DIAGNOSIS — I878 Other specified disorders of veins: Secondary | ICD-10-CM | POA: Diagnosis present

## 2020-04-21 DIAGNOSIS — S91332A Puncture wound without foreign body, left foot, initial encounter: Secondary | ICD-10-CM | POA: Diagnosis present

## 2020-04-21 DIAGNOSIS — Z6841 Body Mass Index (BMI) 40.0 and over, adult: Secondary | ICD-10-CM

## 2020-04-21 DIAGNOSIS — Z20822 Contact with and (suspected) exposure to covid-19: Secondary | ICD-10-CM | POA: Diagnosis present

## 2020-04-21 DIAGNOSIS — Z7984 Long term (current) use of oral hypoglycemic drugs: Secondary | ICD-10-CM | POA: Diagnosis not present

## 2020-04-21 DIAGNOSIS — Z7952 Long term (current) use of systemic steroids: Secondary | ICD-10-CM | POA: Diagnosis not present

## 2020-04-21 DIAGNOSIS — I10 Essential (primary) hypertension: Secondary | ICD-10-CM | POA: Diagnosis not present

## 2020-04-21 DIAGNOSIS — I5032 Chronic diastolic (congestive) heart failure: Secondary | ICD-10-CM | POA: Diagnosis present

## 2020-04-21 DIAGNOSIS — L03116 Cellulitis of left lower limb: Principal | ICD-10-CM | POA: Diagnosis present

## 2020-04-21 DIAGNOSIS — Z79899 Other long term (current) drug therapy: Secondary | ICD-10-CM | POA: Diagnosis not present

## 2020-04-21 DIAGNOSIS — Z8249 Family history of ischemic heart disease and other diseases of the circulatory system: Secondary | ICD-10-CM

## 2020-04-21 LAB — HEMOGLOBIN A1C
Hgb A1c MFr Bld: 9 % — ABNORMAL HIGH (ref 4.8–5.6)
Mean Plasma Glucose: 211.6 mg/dL

## 2020-04-21 LAB — CBC WITH DIFFERENTIAL/PLATELET
Abs Immature Granulocytes: 0.05 10*3/uL (ref 0.00–0.07)
Basophils Absolute: 0 10*3/uL (ref 0.0–0.1)
Basophils Relative: 0 %
Eosinophils Absolute: 0.2 10*3/uL (ref 0.0–0.5)
Eosinophils Relative: 1 %
HCT: 34.7 % — ABNORMAL LOW (ref 39.0–52.0)
Hemoglobin: 11.1 g/dL — ABNORMAL LOW (ref 13.0–17.0)
Immature Granulocytes: 0 %
Lymphocytes Relative: 26 %
Lymphs Abs: 3.1 10*3/uL (ref 0.7–4.0)
MCH: 25.9 pg — ABNORMAL LOW (ref 26.0–34.0)
MCHC: 32 g/dL (ref 30.0–36.0)
MCV: 81.1 fL (ref 80.0–100.0)
Monocytes Absolute: 0.5 10*3/uL (ref 0.1–1.0)
Monocytes Relative: 4 %
Neutro Abs: 8.2 10*3/uL — ABNORMAL HIGH (ref 1.7–7.7)
Neutrophils Relative %: 69 %
Platelets: 386 10*3/uL (ref 150–400)
RBC: 4.28 MIL/uL (ref 4.22–5.81)
RDW: 15.7 % — ABNORMAL HIGH (ref 11.5–15.5)
WBC: 12 10*3/uL — ABNORMAL HIGH (ref 4.0–10.5)
nRBC: 0 % (ref 0.0–0.2)

## 2020-04-21 LAB — BASIC METABOLIC PANEL
Anion gap: 9 (ref 5–15)
BUN: 9 mg/dL (ref 6–20)
CO2: 28 mmol/L (ref 22–32)
Calcium: 8.6 mg/dL — ABNORMAL LOW (ref 8.9–10.3)
Chloride: 99 mmol/L (ref 98–111)
Creatinine, Ser: 0.9 mg/dL (ref 0.61–1.24)
GFR calc Af Amer: >60 mL/min (ref >60)
GFR calc non Af Amer: >60 mL/min (ref >60)
Glucose, Bld: 230 mg/dL — ABNORMAL HIGH (ref 70–99)
Potassium: 3.2 mmol/L — ABNORMAL LOW (ref 3.5–5.1)
Sodium: 136 mmol/L (ref 135–145)

## 2020-04-21 LAB — SARS CORONAVIRUS 2 BY RT PCR (HOSPITAL ORDER, PERFORMED IN ~~LOC~~ HOSPITAL LAB): SARS Coronavirus 2: NEGATIVE

## 2020-04-21 LAB — GLUCOSE, CAPILLARY: Glucose-Capillary: 263 mg/dL — ABNORMAL HIGH (ref 70–99)

## 2020-04-21 LAB — SEDIMENTATION RATE: Sed Rate: 91 mm/hr — ABNORMAL HIGH (ref 0–15)

## 2020-04-21 LAB — C-REACTIVE PROTEIN: CRP: 8.1 mg/dL — ABNORMAL HIGH (ref <1.0)

## 2020-04-21 MED ORDER — IBUPROFEN 400 MG PO TABS: 400.0000 mg | ORAL_TABLET | Freq: Four times a day (QID) | ORAL | Status: DC | PRN

## 2020-04-21 MED ORDER — SODIUM CHLORIDE 0.9 % IV SOLN
2.0000 g | Freq: Once | INTRAVENOUS | Status: AC
  Administered 2020-04-21: 2 g via INTRAVENOUS
  Filled 2020-04-21: qty 20

## 2020-04-21 MED ORDER — HYDROCODONE-ACETAMINOPHEN 5-325 MG PO TABS: 1.0000 | ORAL_TABLET | ORAL | Status: DC | PRN

## 2020-04-21 MED ORDER — KETOROLAC TROMETHAMINE 30 MG/ML IJ SOLN: 30.0000 mg | Freq: Four times a day (QID) | INTRAMUSCULAR | Status: DC | PRN

## 2020-04-21 MED ORDER — SODIUM CHLORIDE 0.9 % IV SOLN
2.0000 g | INTRAVENOUS | Status: DC
  Administered 2020-04-22: 2 g via INTRAVENOUS
  Filled 2020-04-21 (×2): qty 20

## 2020-04-21 MED ORDER — INSULIN ASPART 100 UNIT/ML ~~LOC~~ SOLN
0.0000 [IU] | Freq: Three times a day (TID) | SUBCUTANEOUS | Status: DC
  Administered 2020-04-22 (×2): 2 [IU] via SUBCUTANEOUS

## 2020-04-21 MED ORDER — ENOXAPARIN SODIUM 40 MG/0.4ML ~~LOC~~ SOLN
40.0000 mg | Freq: Two times a day (BID) | SUBCUTANEOUS | Status: DC
  Administered 2020-04-21 – 2020-04-22 (×2): 40 mg via SUBCUTANEOUS
  Filled 2020-04-21 (×3): qty 0.4

## 2020-04-21 MED ORDER — POLYETHYLENE GLYCOL 3350 17 G PO PACK: 17.0000 g | PACK | Freq: Every day | ORAL | Status: DC | PRN

## 2020-04-21 MED ORDER — ONDANSETRON HCL 4 MG/2ML IJ SOLN: 4.0000 mg | Freq: Four times a day (QID) | INTRAMUSCULAR | Status: DC | PRN

## 2020-04-21 MED ORDER — ONDANSETRON HCL 4 MG PO TABS: 4.0000 mg | ORAL_TABLET | Freq: Four times a day (QID) | ORAL | Status: DC | PRN

## 2020-04-21 MED ORDER — DOCUSATE SODIUM 100 MG PO CAPS
100.0000 mg | ORAL_CAPSULE | Freq: Two times a day (BID) | ORAL | Status: DC
  Administered 2020-04-21 – 2020-04-22 (×2): 100 mg via ORAL
  Filled 2020-04-21 (×2): qty 1

## 2020-04-21 MED ORDER — INSULIN ASPART 100 UNIT/ML ~~LOC~~ SOLN
0.0000 [IU] | Freq: Every day | SUBCUTANEOUS | Status: DC
  Administered 2020-04-21: 3 [IU] via SUBCUTANEOUS
  Filled 2020-04-21: qty 1

## 2020-04-21 MED ORDER — VANCOMYCIN HCL 10 G IV SOLR
2500.0000 mg | Freq: Once | INTRAVENOUS | Status: AC
  Administered 2020-04-21: 2500 mg via INTRAVENOUS
  Filled 2020-04-21: qty 2500

## 2020-04-21 NOTE — Progress Notes (Signed)
MRI called, informed that they were not able to fully complete the MRI, only some images complete pt stated that he was uncomfortable, per MRI will try again tomorrow if pt willing

## 2020-04-21 NOTE — ED Triage Notes (Signed)
Patient reports sent by his primary care doctor due to infection of left foot and oral antibiotics not working.

## 2020-04-21 NOTE — Consult Note (Signed)
PHARMACY -  BRIEF ANTIBIOTIC NOTE   Pharmacy has received consult(s) for  Vancomycin from an ED provider.  The patient's profile has been reviewed for ht/wt/allergies/indication/available labs.    One time order(s) placed for Vancomycin 2500 mg   Further antibiotics/pharmacy consults should be ordered by admitting physician if indicated.                       Thank you, Gardner Candle, PharmD, BCPS Clinical Pharmacist 04/21/2020 11:50 AM

## 2020-04-21 NOTE — Progress Notes (Signed)
Anticoagulation monitoring(Lovenox):  39 yo male ordered Lovenox 40 mg Q24h  Filed Weights   04/21/20 0623  Weight: 136 kg (299 lb 13.2 oz)   BMI 45.59  Lab Results  Component Value Date   CREATININE 0.90 04/21/2020   CREATININE 0.93 04/14/2020   CREATININE 1.03 03/26/2020   Estimated Creatinine Clearance: 148.7 mL/min (by C-G formula based on SCr of 0.9 mg/dL). Hemoglobin & Hematocrit     Component Value Date/Time   HGB 11.1 (L) 04/21/2020 0627   HGB 15.7 04/25/2014 1418   HCT 34.7 (L) 04/21/2020 0627   HCT 48.5 04/25/2014 1418     Per Protocol for Patient with estCrcl > 30 ml/min and BMI > 40, will transition to Lovenox 40 mg Q12h.

## 2020-04-21 NOTE — H&P (Signed)
[facility] - [place] [facility]   PATIENT NAME: Walter Pena    MR#:  [id number]  DATE OF BIRTH:  September 16, 1981  DATE OF ADMISSION:  04/21/2020  PRIMARY CARE PHYSICIAN: Sherron Monday, MD   REQUESTING/REFERRING PHYSICIAN: Dr Colon Branch   Patient coming from :Home   CHIEF COMPLAINT:  Right leg swelling, pain and redness for several days  HISTORY OF PRESENT ILLNESS:  Walter Pena  is a 39 y.o. male with a known history of diastolic congestive heart failure, type II diabetes, history of schizophrenia, hypertension, COPD comes to the emergency room with concern for Foot/left  leg swelling redness and pain. Patient says a few weeks ago he stepped on a piece of wood bark with resultant pain. He came to the emergency room on May 30. He was started on BID doxycycline which states he states he has been compliant with. Despite this over the last several days he developed significant swelling and erythema with warmth.  He saw his primary care who referred him to the ER. There is no history of DVT.  ED course: in the ER patient remains hemodynamically stable. White count is 12,000. He received IV vancomycin and Rocephin. Patient is being admitted with left leg cellulitis status post injury. PAST MEDICAL HISTORY:   Past Medical History:  Diagnosis Date  . CHF (congestive heart failure) (HCC)   . COPD (chronic obstructive pulmonary disease) (HCC)   . Gout   . Hidradenitis   . Hypertension   . Schizophrenia (HCC)     PAST SURGICAL HISTOIRY:   Past Surgical History:  Procedure Laterality Date  . sweat gland removal      SOCIAL HISTORY:   Social History   Tobacco Use  . Smoking status: Current Every Day Smoker    Packs/day: 1.00    Years: 25.00    Pack years: 25.00    Types: Cigarettes  . Smokeless tobacco: Never Used  Substance Use Topics  . Alcohol use: Not Currently    Comment: Socially    FAMILY HISTORY:   Family History  Problem Relation Age of  Onset  . Diabetes Mellitus II Mother   . Hypertension Mother   . Hypertension Father     DRUG ALLERGIES:   Allergies  Allergen Reactions  . Bactrim [Sulfamethoxazole-Trimethoprim] Nausea And Vomiting  . Lisinopril Swelling  . Other Rash    aloe    REVIEW OF SYSTEMS:  Review of Systems  Constitutional: Negative for chills, fever and weight loss.  HENT: Negative for ear discharge, ear pain and nosebleeds.   Eyes: Negative for blurred vision, pain and discharge.  Respiratory: Negative for sputum production, shortness of breath, wheezing and stridor.   Cardiovascular: Positive for leg swelling. Negative for chest pain, palpitations, orthopnea and PND.  Gastrointestinal: Negative for abdominal pain, diarrhea, nausea and vomiting.  Genitourinary: Negative for frequency and urgency.  Musculoskeletal: Positive for joint pain. Negative for back pain.  Neurological: Negative for sensory change, speech change, focal weakness and weakness.  Psychiatric/Behavioral: Negative for depression and hallucinations. The patient is not nervous/anxious.      MEDICATIONS AT HOME:   Prior to Admission medications   Medication Sig Start Date End Date Taking? Authorizing Provider  albuterol (VENTOLIN HFA) 108 (90 Base) MCG/ACT inhaler Inhale 2 puffs into the lungs every 6 (six) hours as needed for wheezing or shortness of breath. 07/30/19   Enedina Finner, MD  albuterol (VENTOLIN HFA) 108 (90 Base) MCG/ACT inhaler Inhale 2 puffs into the lungs  every 6 (six) hours as needed for wheezing or shortness of breath. 10/15/19   Gregor Hams, MD  albuterol (VENTOLIN HFA) 108 (90 Base) MCG/ACT inhaler Inhale 2 puffs into the lungs every 6 (six) hours as needed for wheezing or shortness of breath. 03/26/20   Nance Pear, MD  allopurinol (ZYLOPRIM) 100 MG tablet Take 100 mg by mouth daily. 01/21/17   [provider]  amLODipine (NORVASC) 10 MG tablet Take 1 tablet (10 mg total) by mouth daily. 07/30/19    Fritzi Mandes, MD  clindamycin (CLINDAGEL) 1 % gel Apply to affected area (abdominal region, buttock area of hidradenitis) 2 times daily after cleaning with warm soap and water and drying completely 02/12/20 02/11/21  Lilia Pro., MD  doxycycline (VIBRAMYCIN) 100 MG capsule Take 1 capsule (100 mg total) by mouth 2 (two) times daily for 7 days. 04/15/20 04/22/20  Nance Pear, MD  famotidine (PEPCID) 20 MG tablet Take 1 tablet (20 mg total) by mouth daily. 04/15/20 04/15/21  Nance Pear, MD  INVEGA SUSTENNA 156 MG/ML SUSY injection  12/23/19   [provider]  metFORMIN (GLUCOPHAGE) 1000 MG tablet Take 1 tablet (1,000 mg total) by mouth 2 (two) times daily. 07/30/19   Fritzi Mandes, MD  olmesartan (BENICAR) 20 MG tablet Take 1 tablet (20 mg total) by mouth daily. 07/30/19   Fritzi Mandes, MD  predniSONE (DELTASONE) 10 MG tablet Start with 50 mg daily. Taper by 10 mg daily then stop. 07/31/19   Fritzi Mandes, MD  predniSONE (DELTASONE) 20 MG tablet Take 2 tablets (40 mg total) by mouth daily. 03/26/20   Nance Pear, MD  traMADol Veatrice Bourbon) 50 MG tablet  07/30/19   [provider]      VITAL SIGNS:  Blood pressure (!) 162/97, pulse 95, temperature 99.3 F (37.4 C), temperature source Oral, resp. rate 18, height 5\' 8"  (1.727 m), weight 136 kg, SpO2 95 %.  PHYSICAL EXAMINATION:  GENERAL:  40 y.o.-year-old patient lying in the bed with no acute distress. Obese EYES: Pupils equal, round, reactive to light and accommodation. No scleral icterus.  HEENT: Head atraumatic, normocephalic. Oropharynx and nasopharynx clear.  NECK:  Supple, no jugular venous distention. No thyroid enlargement, no tenderness.  LUNGS: Normal breath sounds bilaterally, no wheezing, rales,rhonchi or crepitation. No use of accessory muscles of respiration.  CARDIOVASCULAR: S1, S2 normal. No murmurs, rubs, or gallops.  ABDOMEN: Soft, nontender, nondistended. Bowel sounds present. No organomegaly or mass.  EXTREMITIES:  left foot on 04/21/2020      NEUROLOGIC: Cranial nerves II through XII are intact. Muscle strength 5/5 in all extremities. Sensation intact. Gait not checked.  PSYCHIATRIC: The patient is alert and oriented x 3.  SKIN: No obvious rash, lesion, or ulcer. As above  LABORATORY PANEL:   CBC Recent Labs  Lab 04/21/20 0627  WBC 12.0*  HGB 11.1*  HCT 34.7*  PLT 386   ------------------------------------------------------------------------------------------------------------------  Chemistries  Recent Labs  Lab 04/14/20 1837 04/14/20 1837 04/21/20 0627  NA 136   < > 136  K 3.3*   < > 3.2*  CL 103   < > 99  CO2 24   < > 28  GLUCOSE 192*   < > 230*  BUN 10   < > 9  CREATININE 0.93   < > 0.90  CALCIUM 8.4*   < > 8.6*  AST 25  --   --   ALT 42  --   --   ALKPHOS 74  --   --  BILITOT 0.8  --   --    < > = values in this interval not displayed.   ------------------------------------------------------------------------------------------------------------------  Cardiac Enzymes No results for input(s): TROPONINI in the last 168 hours. ------------------------------------------------------------------------------------------------------------------  RADIOLOGY:  US Venous Img Lower  Left (DVT Study)  Result Date: 04/21/2020 CLINICAL DATA:  Plantar wound, swelling EXAM: LEFT LOWER EXTREMITY VENOUS DOPPLER ULTRASOUND TECHNIQUE: Gray-scale sonography with compression, as well as color and duplex ultrasound, were performed to evaluate the deep venous system(s) from the level of the common femoral vein through the popliteal and proximal calf veins. COMPARISON:  None. FINDINGS: VENOUS Normal compressibility of the common femoral, superficial femoral, and popliteal veins, as well as the visualized calf veins. Visualized portions of profunda femoral vein and great saphenous vein unremarkable. No filling defects to suggest DVT on grayscale or color Doppler imaging. Doppler waveforms show  normal direction of venous flow, normal respiratory phasicity and response to augmentation. Limited views of the contralateral common femoral vein are unremarkable. OTHER Subcutaneous edema in the calf. Prominent left inguinal lymph nodes up to 1.4 cm short axis diameter. Limitations: Technologist describes technically difficult study of the peroneal vein secondary to edema. IMPRESSION: No femoropopliteal DVT nor evidence of DVT within the visualized calf veins. If clinical symptoms are inconsistent or if there are persistent or worsening symptoms, further imaging (possibly involving the iliac veins) may be warranted. Electronically Signed   By: Corlis Leak M.D.   On: 04/21/2020 15:01   DG Foot 2 Views Left  Result Date: 04/21/2020 CLINICAL DATA:  Plantar wound, leg swelling, infection EXAM: LEFT FOOT - 2 VIEW COMPARISON:  04/15/2020 FINDINGS: No fracture or dislocation of the left foot. Moderate midfoot arthrosis. There is diffuse soft tissue edema about the forefoot. No radiopaque foreign body. IMPRESSION: 1. No fracture or dislocation of the left foot. No radiographic evidence of bony erosion or sclerosis to suggest osteomyelitis. Consider MRI to more sensitively evaluate for bone marrow edema and osteomyelitis if clinically suspected. 2. Diffuse soft tissue edema about the forefoot. No radiopaque foreign body. Electronically Signed   By: Lauralyn Primes M.D.   On: 04/21/2020 12:24    EKG:    IMPRESSION AND PLAN:   Walter Pena  is a 39 y.o. male with a known history of diastolic congestive heart failure, type II diabetes, history of schizophrenia, hypertension, COPD comes to the emergency room with concern for Foot/left  leg swelling redness and pain.  1. Left LE cellulitis -Admit to med-surg -keep leg elevated -appears to be moderate nonpareil and cellulitis will start patient on IV Rocephin. -I will have podiatry see patient given history of diabetes to see whether he needs MRI of the  foot -patient has significant amount of venous stasis changes with skin breakdown on the tibial shin putting him at a risk for cellulitis -PRN PO pain meds  2. Type II diabetes, uncontrolled without complication -sliding scale insulin -will resume PO home meds  3. Hypertension continue home meds  4. History of schizophrenia appears stable and chronic  5. DVT prophylaxis subcu Lovenox  6. Morbid obesity diet and exercise discussed  Family Communication :none Consults :Podiatry Code Status :full DVT prophylaxis :lovenox  Status is: Inpatient  Remains inpatient appropriate because:IV treatments appropriate due to intensity of illness or inability to take PO   Dispo: The patient is from: Home              Anticipated d/c is to: Home  Anticipated d/c date is: 2 days              Patient currently is not medically stable to d/c. needs IV abxs and Podiatry to see pt     TOTAL TIME TAKING CARE OF THIS PATIENT: *45* minutes.    Enedina Finner M.D  [facility]     CC: Primary care physician; Sherron Monday, MD

## 2020-04-21 NOTE — ED Provider Notes (Addendum)
[facility] Emergency Department Provider Note  ____________________________________________   First MD Initiated Contact with Patient 04/21/20 1102     (approximate)  I have reviewed the triage vital signs and the nursing notes.  History  Chief Complaint Wound Infection    HPI Walter Pena. is a 39 y.o. male with PMHx as below, who presents to the ER w/ concern for foot/leg infection. He states a few weeks ago he stepped on a piece of wood/bark with resultant pain. He was seen here on 5/30 for this, with XR and BSUS negative for foreign body and exam reassuring. Started on BID doxycyline, which he states he has been compliant with. Despite this, over the last several days he has developed significant swelling to the foot and up the lower leg, associated with erythema and warmth. He was seen by his PCP who referred him to the ER for IV antibiotics given progression of his infection despite oral antibiotics. He denies any fever, no vomiting. No hx of DVT or VTE.    Past Medical Hx Past Medical History:  Diagnosis Date  . CHF (congestive heart failure) ([place])   . COPD (chronic obstructive pulmonary disease) ([place])   . Gout   . Hidradenitis   . Hypertension   . Schizophrenia [facility])     Problem List Patient Active Problem List   Diagnosis Date Noted  . Pain due to onychomycosis of toenails of both feet 08/04/2019  . Diabetes mellitus without complication ([place]) 78/46/9629  . Acute respiratory failure with hypoxia ([place]) 07/28/2019  . Schizophrenia, undifferentiated ([place]) 10/20/2017  . Diabetes ([place]) 02/18/2017  . Schizophrenia ([place]) 02/17/2017  . Alcohol use disorder, moderate, dependence ([place]) 02/17/2017  . Noncompliance 05/07/2016  . HTN (hypertension) 01/21/2016  . Cannabis use disorder, moderate, dependence ([place]) 01/21/2016  . Tobacco use disorder 01/21/2016  . Gout 01/21/2016  . Hidradenitis 01/21/2016    Past Surgical Hx Past Surgical  History:  Procedure Laterality Date  . sweat gland removal      Medications Prior to Admission medications   Medication Sig Start Date End Date Taking? Authorizing Provider  albuterol (VENTOLIN HFA) 108 (90 Base) MCG/ACT inhaler Inhale 2 puffs into the lungs every 6 (six) hours as needed for wheezing or shortness of breath. 07/30/19   Fritzi Mandes, MD  albuterol (VENTOLIN HFA) 108 (90 Base) MCG/ACT inhaler Inhale 2 puffs into the lungs every 6 (six) hours as needed for wheezing or shortness of breath. 10/15/19   Gregor Hams, MD  albuterol (VENTOLIN HFA) 108 (90 Base) MCG/ACT inhaler Inhale 2 puffs into the lungs every 6 (six) hours as needed for wheezing or shortness of breath. 03/26/20   Nance Pear, MD  allopurinol (ZYLOPRIM) 100 MG tablet Take 100 mg by mouth daily. 01/21/17   [provider]  amLODipine (NORVASC) 10 MG tablet Take 1 tablet (10 mg total) by mouth daily. 07/30/19   Fritzi Mandes, MD  clindamycin (CLINDAGEL) 1 % gel Apply to affected area (abdominal region, buttock area of hidradenitis) 2 times daily after cleaning with warm soap and water and drying completely 02/12/20 02/11/21  Lilia Pro., MD  doxycycline (VIBRAMYCIN) 100 MG capsule Take 1 capsule (100 mg total) by mouth 2 (two) times daily for 7 days. 04/15/20 04/22/20  Nance Pear, MD  famotidine (PEPCID) 20 MG tablet Take 1 tablet (20 mg total) by mouth daily. 04/15/20 04/15/21  Nance Pear, MD  [facility] SUSTENNA 156 MG/ML SUSY injection  12/23/19   [provider]  metFORMIN (GLUCOPHAGE) 1000 MG tablet Take 1 tablet (1,000 mg total) by mouth 2 (two) times daily. 07/30/19   Fritzi Mandes, MD  olmesartan (BENICAR) 20 MG tablet Take 1 tablet (20 mg total) by mouth daily. 07/30/19   Fritzi Mandes, MD  predniSONE (DELTASONE) 10 MG tablet Start with 50 mg daily. Taper by 10 mg daily then stop. 07/31/19   Fritzi Mandes, MD  predniSONE (DELTASONE) 20 MG tablet Take 2 tablets (40 mg total) by mouth daily. 03/26/20    Nance Pear, MD  traMADol Veatrice Bourbon) 50 MG tablet  07/30/19   [provider]    Allergies Bactrim [sulfamethoxazole-trimethoprim], Lisinopril, and Other  Family Hx Family History  Problem Relation Age of Onset  . Diabetes Mellitus II Mother   . Hypertension Mother   . Hypertension Father     Social Hx Social History   Tobacco Use  . Smoking status: Current Every Day Smoker    Packs/day: 1.00    Years: 25.00    Pack years: 25.00    Types: Cigarettes  . Smokeless tobacco: Never Used  Substance Use Topics  . Alcohol use: Not Currently    Comment: Socially  . Drug use: Not Currently    Types: Marijuana     Review of Systems  Constitutional: Negative for fever. Negative for chills. Eyes: Negative for visual changes. ENT: Negative for sore throat. Cardiovascular: Negative for chest pain. Respiratory: Negative for shortness of breath. Gastrointestinal: Negative for nausea. Negative for vomiting.  Genitourinary: Negative for dysuria. Musculoskeletal: + for leg swelling. Skin: + leg redness Neurological: Negative for headaches.   Physical Exam  Vital Signs: ED Triage Vitals [04/21/20 0623]  Enc Vitals Group     BP (!) 162/97     Pulse Rate 95     Resp 18     Temp 99.3 F (37.4 C)     Temp Source Oral     SpO2 95 %     Weight 299 lb 13.2 oz (136 kg)     Height '5\' 8"'$  (1.727 m)     Head Circumference      Peak Flow      Pain Score      Pain Loc      Pain Edu?      Excl. in [place]?     Constitutional: Alert and oriented. Overweight. NAD.  Head: Normocephalic. Atraumatic. Eyes: Conjunctivae clear. Sclera anicteric. Pupils equal and symmetric. Nose: No masses or lesions. No congestion or rhinorrhea. Mouth/Throat: Wearing mask.  Neck: No stridor. Trachea midline.  Cardiovascular: Normal rate, regular rhythm. Extremities well perfused. Respiratory: Normal respiratory effort.  Lungs CTAB. Gastrointestinal: Soft. Non-distended. Non-tender.    Genitourinary: Deferred. Musculoskeletal: Significant swelling of the LLE compared to the RLE. Edematous. Erythematous and warm to the touch, from the dorsum of the foot to the proximal shin. Area from old puncture wound visualized on plantar surface of left foot. No drainage, fluctuance, or palpable abcsess. No crepitance.  Neurologic:  Normal speech and language. No gross focal or lateralizing neurologic deficits are appreciated.  Skin: Erythema and warmth of the LLE from the proximal shin, distally.  Psychiatric: Mood and affect are appropriate for situation.   Radiology  Personally reviewed available imaging myself.   XR LEFT foot - IMPRESSION:  1. No fracture or dislocation of the left foot. No radiographic  evidence of bony erosion or sclerosis to suggest osteomyelitis.  Consider MRI to more sensitively evaluate for bone marrow edema and  osteomyelitis  if clinically suspected.   2. Diffuse soft tissue edema about the forefoot. No radiopaque  foreign body.   US DVT LLE -  IMPRESSION:  No femoropopliteal DVT nor evidence of DVT within the visualized  calf veins.   If clinical symptoms are inconsistent or if there are persistent or  worsening symptoms, further imaging (possibly involving the iliac  veins) may be warranted.    Procedures  Procedure(s) performed (including critical care):  Procedures   Initial Impression / Assessment and Plan / MDM / ED Course  39 y.o. male who presents to the ED with swelling, erythema, pain of the LLE. This in the setting of stepping on a piece of wood several weeks ago, and despite compliance w/ outpatient antibiotics  Ddx: cellulitis, failed outpatient treatment of cellulitis, DVT  Will plan for labs, XR, [place], IV antibiotics.   XR negative for radiopaque foreign body, no radiographic evidence of osteomyelitis.  Ultrasound negative for DVT. No gas on XR or crepitance on exam. Mild leukocytosis to 12. ESR/CRP pending. As such, will  proceed with admission for treatment of cellulitis with IV antibiotics, as he is not improved and in fact worsened on oral doxycycline.  Patient is agreeable with the plan.   _______________________________   As part of my medical decision making I have reviewed available labs, radiology tests, reviewed old records/performed chart review.   Final Clinical Impression(s) / ED Diagnosis  Cellulitis of LLE    Note:  This document was prepared using Dragon voice recognition software and may include unintentional dictation errors.       Lilia Pro., MD 04/21/20 1723

## 2020-04-22 LAB — GLUCOSE, CAPILLARY
Glucose-Capillary: 162 mg/dL — ABNORMAL HIGH (ref 70–99)
Glucose-Capillary: 196 mg/dL — ABNORMAL HIGH (ref 70–99)
Glucose-Capillary: 179 mg/dL — ABNORMAL HIGH (ref 70–99)
Glucose-Capillary: 167 mg/dL — ABNORMAL HIGH (ref 70–99)

## 2020-04-22 MED ORDER — FAMOTIDINE 20 MG PO TABS
20.0000 mg | ORAL_TABLET | Freq: Every day | ORAL | Status: DC
  Administered 2020-04-23: 20 mg via ORAL
  Filled 2020-04-22: qty 1

## 2020-04-22 MED ORDER — IRBESARTAN 150 MG PO TABS
150.0000 mg | ORAL_TABLET | Freq: Every day | ORAL | Status: DC
  Administered 2020-04-23: 09:00:00 150 mg via ORAL
  Filled 2020-04-22: qty 1

## 2020-04-22 MED ORDER — VANCOMYCIN HCL 1250 MG/250ML IV SOLN
1250.0000 mg | Freq: Once | INTRAVENOUS | Status: AC
  Administered 2020-04-22: 1250 mg via INTRAVENOUS
  Filled 2020-04-22: qty 250

## 2020-04-22 MED ORDER — AMLODIPINE BESYLATE 5 MG PO TABS
5.0000 mg | ORAL_TABLET | Freq: Every day | ORAL | Status: DC
  Administered 2020-04-23: 5 mg via ORAL
  Filled 2020-04-22: qty 1

## 2020-04-22 MED ORDER — METFORMIN HCL 500 MG PO TABS
1000.0000 mg | ORAL_TABLET | Freq: Two times a day (BID) | ORAL | Status: DC
  Administered 2020-04-22 – 2020-04-23 (×2): 1000 mg via ORAL
  Filled 2020-04-22 (×3): qty 2

## 2020-04-22 MED ORDER — ALLOPURINOL 100 MG PO TABS
100.0000 mg | ORAL_TABLET | Freq: Every day | ORAL | Status: DC
  Administered 2020-04-23: 09:00:00 100 mg via ORAL
  Filled 2020-04-22: qty 1

## 2020-04-22 MED ORDER — FLUCONAZOLE 100 MG PO TABS
100.0000 mg | ORAL_TABLET | Freq: Every day | ORAL | Status: DC
  Administered 2020-04-22 – 2020-04-23 (×2): 100 mg via ORAL
  Filled 2020-04-22 (×2): qty 1

## 2020-04-22 NOTE — Progress Notes (Signed)
[facility] NAME: Walter Pena    MR#:  [id number]  DATE OF BIRTH:  [birth year]  SUBJECTIVE:  patient's leg looks better. He has been allowed been noncompliant on keeping his leg elevated per RN. No fever.  REVIEW OF SYSTEMS:   Review of Systems  Constitutional: Negative for chills, fever and weight loss.  HENT: Negative for ear discharge, ear pain and nosebleeds.   Eyes: Negative for blurred vision, pain and discharge.  Respiratory: Negative for sputum production, shortness of breath, wheezing and stridor.   Cardiovascular: Positive for leg swelling. Negative for chest pain, palpitations, orthopnea and PND.  Gastrointestinal: Negative for abdominal pain, diarrhea, nausea and vomiting.  Genitourinary: Negative for frequency and urgency.  Musculoskeletal: Negative for back pain and joint pain.  Neurological: Negative for sensory change, speech change, focal weakness and weakness.  Psychiatric/Behavioral: Negative for depression and hallucinations. The patient is not nervous/anxious.    Tolerating Diet: Tolerating PT: not needed  DRUG ALLERGIES:   Allergies  Allergen Reactions  . Bactrim [Sulfamethoxazole-Trimethoprim] Nausea And Vomiting  . Lisinopril Swelling  . Other Rash    aloe    VITALS:  Blood pressure (!) 172/84, pulse (!) 108, temperature 99.1 F (37.3 C), resp. rate 18, height 5\' 8"  (1.727 m), weight 136 kg, SpO2 94 %.  PHYSICAL EXAMINATION:   Physical Exam  GENERAL:  39 y.o.-year-old patient lying in the bed with no acute distress. obese EYES: Pupils equal, round, reactive to light and accommodation. No scleral icterus.   HEENT: Head atraumatic, normocephalic. Oropharynx and nasopharynx clear.  NECK:  Supple, no jugular venous distention. No thyroid enlargement, no tenderness.  LUNGS: Normal breath sounds bilaterally, no wheezing, rales, rhonchi. No use of accessory muscles of respiration.  CARDIOVASCULAR:  S1, S2 normal. No murmurs, rubs, or gallops.  ABDOMEN: Soft, nontender, nondistended. Bowel sounds present. No organomegaly or mass.  EXTREMITIES: bilateral tibial shin dry skin Left LE  Showing improvement NEUROLOGIC: Cranial nerves II through XII are intact. No focal Motor or sensory deficits b/l.   PSYCHIATRIC:  patient is alert and oriented x 3.  SKIN groin skin rash+  LABORATORY PANEL:  CBC Recent Labs  Lab 04/21/20 0627  WBC 12.0*  HGB 11.1*  HCT 34.7*  PLT 386    Chemistries  Recent Labs  Lab 04/21/20 0627  NA 136  K 3.2*  CL 99  CO2 28  GLUCOSE 230*  BUN 9  CREATININE 0.90  CALCIUM 8.6*   Cardiac Enzymes No results for input(s): TROPONINI in the last 168 hours. RADIOLOGY:  MR FOOT LEFT WO CONTRAST  Result Date: 04/22/2020 CLINICAL DATA:  Foot swelling, diabetes. EXAM: MRI OF THE LEFT FOOT WITHOUT CONTRAST TECHNIQUE: Multiplanar, multisequence MR imaging of the left forefoot was performed. No intravenous contrast was administered. COMPARISON:  Radiographs from 04/21/2020 FINDINGS: Today's exam was terminated by the patient due to pain. Only images axial to the foot were obtained prior to the patient terminating the exam. Please note that this image set is only a subset of the typical normal protocol and as such has reduced diagnostic sensitivity and specificity. Bones/Joint/Cartilage Small medial erosion in the first metatarsal head could be from gout or less likely rheumatoid arthritis. Degenerative arthropathy at the first MTP joint and between the sesamoids and the first metatarsal head. No significant marrow edema in the metatarsals or phalanges to suggest active osteomyelitis. Ligaments No obvious ligamentous derangement along the major joints such as the first  MTP joint. Muscles and Tendons No myositis observed. Soft tissues Extensive dorsal subcutaneous edema extending slightly into the toes. Cellulitis is not excluded. No drainable abscess. IMPRESSION: 1. Extensive  dorsal subcutaneous edema extending slightly into the toes. Cellulitis is not excluded. 2. No findings of active osteomyelitis. 3. Small medial erosion in the first metatarsal head could be from gout or less likely rheumatoid arthritis. 4. Degenerative arthropathy at the first MTP joint and between the sesamoids and first metatarsal head. 5. The patient truncated the exam by refusing further imaging after 3 series had been obtained. Electronically Signed   By: Gaylyn Rong M.D.   On: 04/22/2020 09:11   US Venous Img Lower  Left (DVT Study)  Result Date: 04/21/2020 CLINICAL DATA:  Plantar wound, swelling EXAM: LEFT LOWER EXTREMITY VENOUS DOPPLER ULTRASOUND TECHNIQUE: Gray-scale sonography with compression, as well as color and duplex ultrasound, were performed to evaluate the deep venous system(s) from the level of the common femoral vein through the popliteal and proximal calf veins. COMPARISON:  None. FINDINGS: VENOUS Normal compressibility of the common femoral, superficial femoral, and popliteal veins, as well as the visualized calf veins. Visualized portions of profunda femoral vein and great saphenous vein unremarkable. No filling defects to suggest DVT on grayscale or color Doppler imaging. Doppler waveforms show normal direction of venous flow, normal respiratory phasicity and response to augmentation. Limited views of the contralateral common femoral vein are unremarkable. OTHER Subcutaneous edema in the calf. Prominent left inguinal lymph nodes up to 1.4 cm short axis diameter. Limitations: Technologist describes technically difficult study of the peroneal vein secondary to edema. IMPRESSION: No femoropopliteal DVT nor evidence of DVT within the visualized calf veins. If clinical symptoms are inconsistent or if there are persistent or worsening symptoms, further imaging (possibly involving the iliac veins) may be warranted. Electronically Signed   By: Corlis Leak M.D.   On: 04/21/2020 15:01   DG  Foot 2 Views Left  Result Date: 04/21/2020 CLINICAL DATA:  Plantar wound, leg swelling, infection EXAM: LEFT FOOT - 2 VIEW COMPARISON:  04/15/2020 FINDINGS: No fracture or dislocation of the left foot. Moderate midfoot arthrosis. There is diffuse soft tissue edema about the forefoot. No radiopaque foreign body. IMPRESSION: 1. No fracture or dislocation of the left foot. No radiographic evidence of bony erosion or sclerosis to suggest osteomyelitis. Consider MRI to more sensitively evaluate for bone marrow edema and osteomyelitis if clinically suspected. 2. Diffuse soft tissue edema about the forefoot. No radiopaque foreign body. Electronically Signed   By: Lauralyn Primes M.D.   On: 04/21/2020 12:24   ASSESSMENT AND PLAN:  Walter Pena  is a 39 y.o. male with a known history of diastolic congestive heart failure, type II diabetes, history of schizophrenia, hypertension, COPD comes to the emergency room with concern for Foot/left  leg swelling redness and pain.  1. Left LE cellulitis after traumatic injury by stepping on wood bark last week--improving -keep leg elevated -appears to be moderate nonpurulent cellulitis will start patient on IV Rocephin. -Dr Ether Griffins consulted--MRI needs to be completed to see if has FB or abscess -PRN PO pain meds  2. Type II diabetes, uncontrolled without complication -sliding scale insulin -will resume PO home meds  3. Hypertension continue home meds  4. History of schizophrenia appears stable and chronic  5. DVT prophylaxis subcu Lovenox  6. Morbid obesity diet and exercise discussed  Seen by PT--no PT needs  Family Communication :none Consults :Podiatry Code Status :full DVT prophylaxis :lovenox  Status is: Inpatient  Remains inpatient appropriate because:IV treatments appropriate due to intensity of illness or inability to take PO   Dispo: The patient is from: Home  Anticipated d/c is to: Home  Anticipated d/c  date is: 2 days  Patient currently is not medically stable to d/c. needs IV abxs and Podiatry to see pt  \      TOTAL TIME TAKING CARE OF THIS PATIENT: *35* minutes.  >50% time spent on counselling and coordination of care  Note: This dictation was prepared with Dragon dictation along with smaller phrase technology. Any transcriptional errors that result from this process are unintentional.  Enedina Finner M.D    [facility]   CC: Primary care physician; Sherron Monday, MDPatient ID: Walter Heading., male   DOB: 1980-11-24, 39 y.o.   MRN: [id number]

## 2020-04-22 NOTE — Consult Note (Signed)
WOC Nurse Consult Note: Reason for Consult: wounds in intertriginous area of panus Wound type:Moisture associated skin damage (MASD), specifically intertriginous dermatitis (ITD) with wounds Pressure Injury POA: N/A Measurement: 1cm x 18cm area with scattered full and partial thickness skin loss. Wound NIO:EVOJ, very wet Drainage (amount, consistency, odor) serous to light yellow Periwound: macerated Dressing procedure/placement/frequency:Patient with history of moisture related problems in this area he reports. I will implement a POC using a topical absorbent and antimicrobial (as well as antifungal) dressing, silver hydrofiber (Aquacel Advantage, [place] [phone]) in strips to cover area after cleansing. This will be topped with our house antimicrobial textile, InterDry to wick away moisture in the rest of the fold.  Additionally and if it is not contraindicated, I have communicated with Dr. Eliane Decree this afternoon via Secure Chat and recommended a few doses of oral systemic antifungal e.g., Diflucan.  WOC nursing team will not follow, but will remain available to this patient, the nursing and medical teams.  Please re-consult if needed. Thanks, Ladona Mow, MSN, RN, GNP, [facility] [place]  Pager# [phone]

## 2020-04-22 NOTE — Progress Notes (Signed)
Physical Therapy Evaluation Patient Details Name: Walter Pena. MRN: [id number] DOB: June 05, 1981 Today's Date: 04/22/2020   History of Present Illness  Per MD Note:Walter Pena  is a 39 y.o. male with a known history of diastolic congestive heart failure, type II diabetes, history of schizophrenia, hypertension, COPD comes to the emergency room with concern for Foot/left  leg swelling redness and pain.  Clinical Impression  Patient was evaluated for PT. He is independent with bed mobility, transfers and gait without AD. He has [facility] strength, no balance deficits, he reports no pain and has no impairments. He has no skilled PT needs and will be discharged from PT with no follow up needs.     Follow Up Recommendations No PT follow up    Equipment Recommendations  None recommended by PT    Recommendations for Other Services       Precautions / Restrictions Precautions Precautions: None Restrictions Weight Bearing Restrictions: No      Mobility  Bed Mobility Overal bed mobility: Independent                Transfers Overall transfer level: Independent Equipment used: None             General transfer comment: (foot was bandaged and wrapped)  Ambulation/Gait Ambulation/Gait assistance: Independent(breakfast arrived and he wanted to eat,pt declined gt) Gait Distance (Feet): 20 Feet Assistive device: None Gait Pattern/deviations: Step-through pattern        Stairs            Wheelchair Mobility    Modified Rankin (Stroke Patients Only)       Balance Overall balance assessment: Independent                                           Pertinent Vitals/Pain Pain Assessment: No/denies pain    Home Living Family/patient expects to be discharged to:: Private residence Living Arrangements: Parent Available Help at Discharge: Family   Home Access: Stairs to enter Entrance Stairs-Rails: Right Entrance Stairs-Number of Steps:  1          Prior Function Level of Independence: Independent               Hand Dominance        Extremity/Trunk Assessment   Upper Extremity Assessment Upper Extremity Assessment: Overall WFL for tasks assessed    Lower Extremity Assessment Lower Extremity Assessment: Overall WFL for tasks assessed       Communication   Communication: No difficulties  Cognition Arousal/Alertness: Awake/alert Behavior During Therapy: WFL for tasks assessed/performed Overall Cognitive Status: Within Functional Limits for tasks assessed                                        General Comments      Exercises     Assessment/Plan    PT Assessment Patent does not need any further PT services  PT Problem List         PT Treatment Interventions      PT Goals (Current goals can be found in the Care Plan section)       Frequency     Barriers to discharge        Co-evaluation  AM-PAC PT "6 Clicks" Mobility  Outcome Measure Help needed turning from your back to your side while in a flat bed without using bedrails?: None Help needed moving from lying on your back to sitting on the side of a flat bed without using bedrails?: None Help needed moving to and from a bed to a chair (including a wheelchair)?: None Help needed standing up from a chair using your arms (e.g., wheelchair or bedside chair)?: None Help needed to walk in hospital room?: None Help needed climbing 3-5 steps with a railing? : None 6 Click Score: 24    End of Session   Activity Tolerance: Patient tolerated treatment well Patient left: in bed Nurse Communication: Mobility status      Time: 0945-1000 PT Time Calculation (min) (ACUTE ONLY): 15 min   Charges:   PT Evaluation $PT Eval Low Complexity: 1 Low            Klaire Court, Sharion Settler, PT DPT 04/22/2020, 11:01 AM

## 2020-04-22 NOTE — Consult Note (Signed)
ORTHOPAEDIC CONSULTATION  REQUESTING PHYSICIAN: Enedina Finner, MD  Chief Complaint: Puncture wound left foot  HPI: Walter Pena. is a 39 y.o. male who complains of  Swelling and pain left foot.  Step on piece of wood.  Went through sandal into foot.  Removed himself.  Initially seen in ED.  Xrays negative.  Placed on po abx.  Returned yesterday with pain and swelling.  PMH of DM.  No neuropathy.   Past Medical History:  Diagnosis Date  . CHF (congestive heart failure) (HCC)   . COPD (chronic obstructive pulmonary disease) (HCC)   . Gout   . Hidradenitis   . Hypertension   . Schizophrenia [facility])    Past Surgical History:  Procedure Laterality Date  . sweat gland removal     Social History   Socioeconomic History  . Marital status: Single    Spouse name: Not on file  . Number of children: Not on file  . Years of education: Not on file  . Highest education level: Not on file  Occupational History  . Not on file  Tobacco Use  . Smoking status: Current Every Day Smoker    Packs/day: 1.00    Years: 25.00    Pack years: 25.00    Types: Cigarettes  . Smokeless tobacco: Never Used  Substance and Sexual Activity  . Alcohol use: Not Currently    Comment: Socially  . Drug use: Not Currently    Types: Marijuana  . Sexual activity: Not on file  Other Topics Concern  . Not on file  Social History Narrative  . Not on file   Social Determinants of Health   Financial Resource Strain:   . Difficulty of Paying Living Expenses:   Food Insecurity:   . Worried About Programme researcher, broadcasting/film/video in the Last Year:   . Barista in the Last Year:   Transportation Needs:   . Freight forwarder (Medical):   Marland Kitchen Lack of Transportation (Non-Medical):   Physical Activity:   . Days of Exercise per Week:   . Minutes of Exercise per Session:   Stress:   . Feeling of Stress :   Social Connections:   . Frequency of Communication with Friends and Family:   . Frequency of Social  Gatherings with Friends and Family:   . Attends Religious Services:   . Active Member of Clubs or Organizations:   . Attends Banker Meetings:   Marland Kitchen Marital Status:    Family History  Problem Relation Age of Onset  . Diabetes Mellitus II Mother   . Hypertension Mother   . Hypertension Father    Allergies  Allergen Reactions  . Bactrim [Sulfamethoxazole-Trimethoprim] Nausea And Vomiting  . Lisinopril Swelling  . Other Rash    aloe   Prior to Admission medications   Medication Sig Start Date End Date Taking? Authorizing Provider  allopurinol (ZYLOPRIM) 100 MG tablet Take 100 mg by mouth daily. 01/21/17  Yes [provider]  amLODipine (NORVASC) 10 MG tablet Take 1 tablet (10 mg total) by mouth daily. 07/30/19  Yes Enedina Finner, MD  doxycycline (VIBRAMYCIN) 100 MG capsule Take 1 capsule (100 mg total) by mouth 2 (two) times daily for 7 days. 04/15/20 04/22/20 Yes Phineas Semen, MD  famotidine (PEPCID) 20 MG tablet Take 1 tablet (20 mg total) by mouth daily. 04/15/20 04/15/21 Yes Phineas Semen, MD  INVEGA SUSTENNA 156 MG/ML SUSY injection  12/23/19  Yes [provider]  metFORMIN (  GLUCOPHAGE) 1000 MG tablet Take 1 tablet (1,000 mg total) by mouth 2 (two) times daily. 07/30/19  Yes Enedina Finner, MD  olmesartan (BENICAR) 20 MG tablet Take 1 tablet (20 mg total) by mouth daily. 07/30/19  Yes Enedina Finner, MD  predniSONE (DELTASONE) 20 MG tablet Take 2 tablets (40 mg total) by mouth daily. 03/26/20  Yes Phineas Semen, MD  albuterol (VENTOLIN HFA) 108 (90 Base) MCG/ACT inhaler Inhale 2 puffs into the lungs every 6 (six) hours as needed for wheezing or shortness of breath. 10/15/19   Darci Current, MD  albuterol (VENTOLIN HFA) 108 (90 Base) MCG/ACT inhaler Inhale 2 puffs into the lungs every 6 (six) hours as needed for wheezing or shortness of breath. 03/26/20   Phineas Semen, MD  clindamycin (CLINDAGEL) 1 % gel Apply to affected area (abdominal region, buttock area  of hidradenitis) 2 times daily after cleaning with warm soap and water and drying completely 02/12/20 02/11/21  Miguel Aschoff., MD  predniSONE (DELTASONE) 10 MG tablet Start with 50 mg daily. Taper by 10 mg daily then stop. 07/31/19   Enedina Finner, MD  traMADol Janean Sark) 50 MG tablet  07/30/19   [provider]   MR FOOT LEFT WO CONTRAST  Result Date: 04/22/2020 CLINICAL DATA:  Foot swelling, diabetes. EXAM: MRI OF THE LEFT FOOT WITHOUT CONTRAST TECHNIQUE: Multiplanar, multisequence MR imaging of the left forefoot was performed. No intravenous contrast was administered. COMPARISON:  Radiographs from 04/21/2020 FINDINGS: Today's exam was terminated by the patient due to pain. Only images axial to the foot were obtained prior to the patient terminating the exam. Please note that this image set is only a subset of the typical normal protocol and as such has reduced diagnostic sensitivity and specificity. Bones/Joint/Cartilage Small medial erosion in the first metatarsal head could be from gout or less likely rheumatoid arthritis. Degenerative arthropathy at the first MTP joint and between the sesamoids and the first metatarsal head. No significant marrow edema in the metatarsals or phalanges to suggest active osteomyelitis. Ligaments No obvious ligamentous derangement along the major joints such as the first MTP joint. Muscles and Tendons No myositis observed. Soft tissues Extensive dorsal subcutaneous edema extending slightly into the toes. Cellulitis is not excluded. No drainable abscess. IMPRESSION: 1. Extensive dorsal subcutaneous edema extending slightly into the toes. Cellulitis is not excluded. 2. No findings of active osteomyelitis. 3. Small medial erosion in the first metatarsal head could be from gout or less likely rheumatoid arthritis. 4. Degenerative arthropathy at the first MTP joint and between the sesamoids and first metatarsal head. 5. The patient truncated the exam by refusing further imaging  after 3 series had been obtained. Electronically Signed   By: Gaylyn Rong M.D.   On: 04/22/2020 09:11   US Venous Img Lower  Left (DVT Study)  Result Date: 04/21/2020 CLINICAL DATA:  Plantar wound, swelling EXAM: LEFT LOWER EXTREMITY VENOUS DOPPLER ULTRASOUND TECHNIQUE: Gray-scale sonography with compression, as well as color and duplex ultrasound, were performed to evaluate the deep venous system(s) from the level of the common femoral vein through the popliteal and proximal calf veins. COMPARISON:  None. FINDINGS: VENOUS Normal compressibility of the common femoral, superficial femoral, and popliteal veins, as well as the visualized calf veins. Visualized portions of profunda femoral vein and great saphenous vein unremarkable. No filling defects to suggest DVT on grayscale or color Doppler imaging. Doppler waveforms show normal direction of venous flow, normal respiratory phasicity and response to augmentation. Limited views of the  contralateral common femoral vein are unremarkable. OTHER Subcutaneous edema in the calf. Prominent left inguinal lymph nodes up to 1.4 cm short axis diameter. Limitations: Technologist describes technically difficult study of the peroneal vein secondary to edema. IMPRESSION: No femoropopliteal DVT nor evidence of DVT within the visualized calf veins. If clinical symptoms are inconsistent or if there are persistent or worsening symptoms, further imaging (possibly involving the iliac veins) may be warranted. Electronically Signed   By: Lucrezia Europe M.D.   On: 04/21/2020 15:01   DG Foot 2 Views Left  Result Date: 04/21/2020 CLINICAL DATA:  Plantar wound, leg swelling, infection EXAM: LEFT FOOT - 2 VIEW COMPARISON:  04/15/2020 FINDINGS: No fracture or dislocation of the left foot. Moderate midfoot arthrosis. There is diffuse soft tissue edema about the forefoot. No radiopaque foreign body. IMPRESSION: 1. No fracture or dislocation of the left foot. No radiographic evidence of  bony erosion or sclerosis to suggest osteomyelitis. Consider MRI to more sensitively evaluate for bone marrow edema and osteomyelitis if clinically suspected. 2. Diffuse soft tissue edema about the forefoot. No radiopaque foreign body. Electronically Signed   By: Eddie Candle M.D.   On: 04/21/2020 12:24    Positive ROS: All other systems have been reviewed and were otherwise negative with the exception of those mentioned in the HPI and as above.  12 point ROS was performed.  Physical Exam: General: Alert and oriented.  No apparent distress.  Vascular:  Left foot:Dorsalis Pedis:  present Posterior Tibial:  present  Right foot: Dorsalis Pedis:  present Posterior Tibial:  present  Neuro:intact protective sensation  Derm:Left foot with small puncture site.  Superficial skin removed and tiny 63mm puncture wound noted.  No active purulence.  Ortho/MS: Left foot with edema   Assessment: Puncture wound left foot DMII  Plan: MRI ordered but cancelled. Pt was uncomfortable.  Will attempt today.  Clinically foot if swollen but no active purulence.  If MRI negative can d/c on broad spectrum abx and f/u with podiatry outpt.  Recommend include quinolone to cover for pseudomonas.  If MRI shows retained fb or abscess will plan for I & D.    Elesa Hacker, DPM Cell [phone]   04/22/2020 12:35 PM  1

## 2020-04-23 LAB — GLUCOSE, CAPILLARY: Glucose-Capillary: 164 mg/dL — ABNORMAL HIGH (ref 70–99)

## 2020-04-23 MED ORDER — LEVOFLOXACIN 750 MG PO TABS
750.0000 mg | ORAL_TABLET | Freq: Every day | ORAL | Status: DC
  Administered 2020-04-23: 09:00:00 750 mg via ORAL
  Filled 2020-04-23: qty 1

## 2020-04-23 MED ORDER — FLUCONAZOLE 100 MG PO TABS: 100.0000 mg | ORAL_TABLET | Freq: Every day | ORAL | 0 refills | Status: DC

## 2020-04-23 MED ORDER — LEVOFLOXACIN 750 MG PO TABS: 750.0000 mg | ORAL_TABLET | Freq: Every day | ORAL | 0 refills | Status: DC

## 2020-04-23 NOTE — TOC Initial Note (Signed)
Transition of Care (TOC) - Initial/Assessment Note    Patient Details  Name: Walter Pena. MRN: [id number] Date of Birth: 1981/06/18  Transition of Care [facility]) CM/SW Contact:    Shelbie Hutching, RN Phone Number: 04/23/2020, 9:50 AM  Clinical Narrative:                 Patient admitted to the hospital for cellulitis.  Patient is from home where he lives with his mother.  Patient has a history of schizophrenia and noncompliance.  Patient has wounds under the panus from MASD.  WOC RN has made recommendations for wound care.  Patient's mother is agreeable to home health services and has no preference in agency.  Sharmon Revere with [facility] accepted referral for RN for wound care and social work for [employer].  Patient does not drive, the patent's mother or a friend will be picking him up today.  Bedside RN will provide patient with enough dressing supplies to last for 4 days.  [facility] will be able to order needed supplies after the first visit for admission to their services.   Expected Discharge Plan: [place] Barriers to Discharge: Barriers Resolved   Patient Goals and CMS Choice   CMS Medicare.gov Compare Post Acute Care list provided to:: Patient Represenative (must comment) Choice offered to / list presented to : Parent(mother, Pearly)  Expected Discharge Plan and Services Expected Discharge Plan: [place]   Discharge Planning Services: CM Consult Post Acute Care Choice: [place] arrangements for the past 2 months: Single Family Home Expected Discharge Date: 04/23/20                         HH Arranged: RN, Social Work [place] Agency: [place] Date [place]: 04/23/20 Time [place]: [phone] Representative spoke with at [place]: Sharmon Revere  Prior Living Arrangements/Services Living arrangements for the past 2 months: [place] Lives with:: Parents Patient language  and need for interpreter reviewed:: Yes Do you feel safe going back to the place where you live?: Yes      Need for Family Participation in Patient Care: Yes (Comment)(schizophrenia) Care giver support system in place?: Yes (comment)(mother)   Criminal Activity/Legal Involvement Pertinent to Current Situation/Hospitalization: No - Comment as needed  Activities of Daily Living Home Assistive Devices/Equipment: None ADL Screening (condition at time of admission) Patient's cognitive ability adequate to safely complete daily activities?: Yes Is the patient deaf or have difficulty hearing?: No Does the patient have difficulty seeing, even when wearing glasses/contacts?: No Does the patient have difficulty concentrating, remembering, or making decisions?: No Patient able to express need for assistance with ADLs?: No Does the patient have difficulty dressing or bathing?: No Independently performs ADLs?: Yes (appropriate for developmental age) Does the patient have difficulty walking or climbing stairs?: No Weakness of Legs: Both Weakness of Arms/Hands: Both  Permission Sought/Granted Permission sought to share information with : Case Manager, Family Supports, Other (comment) Permission granted to share information with : Yes, Verbal Permission Granted  Share Information with NAME: Pearly  Permission granted to share info w AGENCY: [facility]  Permission granted to share info w Relationship: Mother     Emotional Assessment Appearance:: Appears stated age Attitude/Demeanor/Rapport: Engaged Affect (typically observed): Accepting Orientation: : Oriented to Self, Oriented to Place, Oriented to  Time, Oriented to Situation Alcohol / Substance Use: Not Applicable Psych Involvement: No (comment)  Admission diagnosis:  Cellulitis [L03.90] Patient Active Problem List   Diagnosis Date Noted  . Cellulitis 04/21/2020  . Uncontrolled type 2 diabetes mellitus with hyperglycemia (HCC)   . Pain due  to onychomycosis of toenails of both feet 08/04/2019  . Diabetes mellitus without complication (HCC) 08/04/2019  . Acute respiratory failure with hypoxia (HCC) 07/28/2019  . 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 Diabetes (HCC) 02/18/2017  . Schizophrenia (HCC) 02/17/2017  . Alcohol use disorder, moderate, dependence (HCC) 02/17/2017  . Noncompliance 05/07/2016  . HTN (hypertension) 01/21/2016  . Cannabis use disorder, moderate, dependence (HCC) 01/21/2016  . Tobacco use disorder 01/21/2016  . Gout 01/21/2016  . Hidradenitis 01/21/2016   PCP:  Sherron Monday, MD Pharmacy:   [facility] [address] ([address]), [place] - [address] [place]) [place] [address] Phone: [phone] Fax: [phone]     Social Determinants of Health (SDOH) Interventions    Readmission Risk Interventions No flowsheet data found.

## 2020-04-23 NOTE — Discharge Summary (Signed)
[facility] - [place] at [facility]   PATIENT NAME: Walter Pena    MR#:  [id number]  DATE OF BIRTH:  July 11, 1981  DATE OF ADMISSION:  04/21/2020 ADMITTING PHYSICIAN: Enedina Finner, MD  DATE OF DISCHARGE: 04/23/2020  PRIMARY CARE PHYSICIAN: Sherron Monday, MD    ADMISSION DIAGNOSIS:  Cellulitis [L03.90]  DISCHARGE DIAGNOSIS:  Left LE cellulitis Left Foot puncture wood  SECONDARY DIAGNOSIS:   Past Medical History:  Diagnosis Date  . CHF (congestive heart failure) (HCC)   . COPD (chronic obstructive pulmonary disease) (HCC)   . Gout   . Hidradenitis   . Hypertension   . Schizophrenia [facility])     HOSPITAL COURSE:   CharlesDavisis a38 y.o.malewith a known history of diastolic congestive heart failure, type II diabetes, history of schizophrenia, hypertension, COPD comes to the emergency room with concern for Foot/leftleg swelling redness and pain.  1. Left LE cellulitis after traumatic injury by stepping on wood bark last week--improving -keep leg elevated -appears to be moderate nonpurulent cellulitis will start patient on IV Rocephin--change to po levaquin -Dr Ether Griffins consulted--MRI  Did not show any FB or abscess -PRN PO pain meds  2.Type II diabetes, uncontrolled without complication -sliding scale insulin -will resume PO home meds  3.Hypertension continue home meds  4.History of schizophrenia appears stable and chronic  5.DVT prophylaxis subcu Lovenox  6.Morbid obesity diet and exercise discussed  7.   per wound RN: Wound type:Moisture associated skin damage (MASD), specifically intertriginous dermatitis (ITD) with wounds Pressure Injury POA: N/A Measurement: 1cm x 18cm area with scattered full and partial thickness skin loss. Wound HQI:ONGE, very wet Drainage (amount, consistency, odor) serous to light yellow Periwound: macerated Dressing procedure/placement/frequency:Patient with history of moisture related problems in  this area he reports. I will implement a POC using a topical absorbent and antimicrobial (as well as antifungal) dressing, silver hydrofiber (Aquacel Advantage, [place] [phone]) in strips to cover area after cleansing. This will be topped with our house antimicrobial textile, InterDry to wick away moisture in the rest of the fold.  Seen by PT--no PT needs  Family Communication:none Consults:Podiatry Code Status:full DVT prophylaxis:lovenox  Status is: Inpatient   Dispo: The patient is from:Home Anticipated d/c is LK:GMWN Anticipated d/c date is: today pt is medically ok for d/c Pt agreeable     CONSULTS OBTAINED:  Treatment Team:  Gwyneth Revels, DPM  DRUG ALLERGIES:   Allergies  Allergen Reactions  . Bactrim [Sulfamethoxazole-Trimethoprim] Nausea And Vomiting  . Lisinopril Swelling  . Other Rash    aloe    DISCHARGE MEDICATIONS:   Allergies as of 04/23/2020      Reactions   Bactrim [sulfamethoxazole-trimethoprim] Nausea And Vomiting   Lisinopril Swelling   Other Rash   aloe      Medication List    STOP taking these medications   clindamycin 1 % gel Commonly known as: CLINDAGEL   doxycycline 100 MG capsule Commonly known as: Vibramycin   predniSONE 10 MG tablet Commonly known as: DELTASONE   predniSONE 20 MG tablet Commonly known as: Deltasone     TAKE these medications   albuterol 108 (90 Base) MCG/ACT inhaler Commonly known as: VENTOLIN HFA Inhale 2 puffs into the lungs every 6 (six) hours as needed for wheezing or shortness of breath.   albuterol 108 (90 Base) MCG/ACT inhaler Commonly known as: VENTOLIN HFA Inhale 2 puffs into the lungs every 6 (six) hours as needed for wheezing or shortness of breath.   allopurinol 100 MG  tablet Commonly known as: ZYLOPRIM Take 100 mg by mouth daily.   amLODipine 10 MG tablet Commonly known as: NORVASC Take 1 tablet (10 mg total) by mouth daily.    famotidine 20 MG tablet Commonly known as: Pepcid Take 1 tablet (20 mg total) by mouth daily.   fluconazole 100 MG tablet Commonly known as: DIFLUCAN Take 1 tablet (100 mg total) by mouth daily.   Invega Sustenna 156 MG/ML Susy injection Generic drug: paliperidone   levofloxacin 750 MG tablet Commonly known as: LEVAQUIN Take 1 tablet (750 mg total) by mouth daily.   metFORMIN 1000 MG tablet Commonly known as: GLUCOPHAGE Take 1 tablet (1,000 mg total) by mouth 2 (two) times daily.   olmesartan 20 MG tablet Commonly known as: BENICAR Take 1 tablet (20 mg total) by mouth daily.   traMADol 50 MG tablet Commonly known as: ULTRAM            Discharge Care Instructions  (From admission, onward)         Start     Ordered   04/23/20 0000  Discharge wound care:    Comments: As above   04/23/20 0759   04/23/20 0000  Discharge wound care:    Comments: Dressing procedure/placement/frequency:Patient with history of moisture related problems in this area he reports. I will implement a POC using a topical absorbent and antimicrobial (as well as antifungal) dressing, silver hydrofiber (Aquacel Advantage, [place] [phone]) in strips to cover area after cleansing. This will be topped with our house antimicrobial textile, InterDry to wick away moisture in the rest of the fold.   04/23/20 0801          If you experience worsening of your admission symptoms, develop shortness of breath, life threatening emergency, suicidal or homicidal thoughts you must seek medical attention immediately by calling 911 or calling your MD immediately  if symptoms less severe.  You Must read complete instructions/literature along with all the possible adverse reactions/side effects for all the Medicines you take and that have been prescribed to you. Take any new Medicines after you have completely understood and accept all the possible adverse reactions/side effects.   Please note  You were cared for by  a hospitalist during your hospital stay. If you have any questions about your discharge medications or the care you received while you were in the hospital after you are discharged, you can call the unit and asked to speak with the hospitalist on call if the hospitalist that took care of you is not available. Once you are discharged, your primary care physician will handle any further medical issues. Please note that NO REFILLS for any discharge medications will be authorized once you are discharged, as it is imperative that you return to your primary care physician (or establish a relationship with a primary care physician if you do not have one) for your aftercare needs so that they can reassess your need for medications and monitor your lab values. Today   SUBJECTIVE   No new complaints  VITAL SIGNS:  Blood pressure (!) 138/98, pulse (!) 104, temperature 98.3 F (36.8 C), temperature source Oral, resp. rate 20, height 5\' 8"  (1.727 m), weight 136 kg, SpO2 95 %.  I/O:    Intake/Output Summary (Last 24 hours) at 04/23/2020 0802 Last data filed at 04/23/2020 06/23/2020 Gross per 24 hour  Intake 88.24 ml  Output 1375 ml  Net -1286.76 ml    PHYSICAL EXAMINATION:  GENERAL:  39 y.o.-year-old patient lying in  the bed with no acute distress. obese EYES: Pupils equal, round, reactive to light and accommodation. No scleral icterus.  HEENT: Head atraumatic, normocephalic. Oropharynx and nasopharynx clear.  NECK:  Supple, no jugular venous distention. No thyroid enlargement, no tenderness.  LUNGS: Normal breath sounds bilaterally, no wheezing, rales,rhonchi or crepitation. No use of accessory muscles of respiration.  CARDIOVASCULAR: S1, S2 normal. No murmurs, rubs, or gallops.  ABDOMEN: Soft, non-tender, non-distended. Bowel sounds present. No organomegaly or mass. Intertriginous dermatitis EXTREMITIES: bilateral LE edema--improving. Le edema and swelling improving NEUROLOGIC: Cranial nerves II through XII  are intact. Muscle strength 5/5 in all extremities. Sensation intact. Gait not checked.  PSYCHIATRIC: The patient is alert and oriented x 3.  SKIN: No obvious rash, lesion, or ulcer.   DATA REVIEW:   CBC  Recent Labs  Lab 04/21/20 0627  WBC 12.0*  HGB 11.1*  HCT 34.7*  PLT 386    Chemistries  Recent Labs  Lab 04/21/20 0627  NA 136  K 3.2*  CL 99  CO2 28  GLUCOSE 230*  BUN 9  CREATININE 0.90  CALCIUM 8.6*    Microbiology Results   Recent Results (from the past 240 hour(s))  Urine Culture     Status: None   Collection Time: 04/15/20  9:04 AM   Specimen: Urine, Random  Result Value Ref Range Status   Specimen Description   Final    URINE, RANDOM Performed at [facility], [address] [place], [place] [address]    Special Requests   Final    NONE Performed at [facility], [address], [place], [place] [address]    Culture   Final    NO GROWTH Performed at [facility], [address] [place], [place] [address]    Report Status 04/16/2020 FINAL  Final  SARS Coronavirus 2 by RT PCR (hospital order, performed in [facility] hospital lab) Nasopharyngeal Nasopharyngeal Swab     Status: None   Collection Time: 04/21/20  5:11 PM   Specimen: Nasopharyngeal Swab  Result Value Ref Range Status   SARS Coronavirus 2 NEGATIVE NEGATIVE Final    Comment: (NOTE) SARS-CoV-2 target nucleic acids are NOT DETECTED. The SARS-CoV-2 RNA is generally detectable in upper and lower respiratory specimens during the acute phase of infection. The lowest concentration of SARS-CoV-2 viral copies this assay can detect is 250 copies / mL. A negative result does not preclude SARS-CoV-2 infection and should not be used as the sole basis for treatment or other patient management decisions.  A negative result may occur with improper specimen collection / handling, submission of specimen other than nasopharyngeal swab, presence of viral mutation(s) within  the areas targeted by this assay, and inadequate number of viral copies (<250 copies / mL). A negative result must be combined with clinical observations, patient history, and epidemiological information. Fact Sheet for Patients:   BoilerBrush.com.cy Fact Sheet for Healthcare Providers: https://pope.com/ This test is not yet approved or cleared  by the [place] FDA and has been authorized for detection and/or diagnosis of SARS-CoV-2 by FDA under an Emergency Use Authorization (EUA).  This EUA will remain in effect (meaning this test can be used) for the duration of the COVID-19 declaration under Section 564(b)(1) of the Act, 21 U.S.C. section 360bbb-3(b)(1), unless the authorization is terminated or revoked sooner. Performed at [facility], [address] [place], [place] [address]     RADIOLOGY:  MR FOOT LEFT WO CONTRAST  Addendum Date:  04/22/2020   ADDENDUM REPORT: 04/22/2020 14:14 ADDENDUM: The original report was by Dr. Gaylyn Rong. The following addendum is by Dr. Gaylyn Rong: The patient returned for additional imaging on 04/22/2020. This included the coronal and sagittal series through the forefoot which completes the examination protocol. In addition to the findings on the original report, the following observations are made based on the new or images: Hallux valgus is observed for example on image 12/9. The Lisfranc ligament appears intact. Dorsal talonavicular spurring. Suspected 8 mm osteochondral lesion of the lateral talar dome on image 18/12. Electronically Signed   By: Gaylyn Rong M.D.   On: 04/22/2020 14:14   Result Date: 04/22/2020 CLINICAL DATA:  Foot swelling, diabetes. EXAM: MRI OF THE LEFT FOOT WITHOUT CONTRAST TECHNIQUE: Multiplanar, multisequence MR imaging of the left forefoot was performed. No intravenous contrast was administered. COMPARISON:  Radiographs from 04/21/2020 FINDINGS:  Today's exam was terminated by the patient due to pain. Only images axial to the foot were obtained prior to the patient terminating the exam. Please note that this image set is only a subset of the typical normal protocol and as such has reduced diagnostic sensitivity and specificity. Bones/Joint/Cartilage Small medial erosion in the first metatarsal head could be from gout or less likely rheumatoid arthritis. Degenerative arthropathy at the first MTP joint and between the sesamoids and the first metatarsal head. No significant marrow edema in the metatarsals or phalanges to suggest active osteomyelitis. Ligaments No obvious ligamentous derangement along the major joints such as the first MTP joint. Muscles and Tendons No myositis observed. Soft tissues Extensive dorsal subcutaneous edema extending slightly into the toes. Cellulitis is not excluded. No drainable abscess. IMPRESSION: 1. Extensive dorsal subcutaneous edema extending slightly into the toes. Cellulitis is not excluded. 2. No findings of active osteomyelitis. 3. Small medial erosion in the first metatarsal head could be from gout or less likely rheumatoid arthritis. 4. Degenerative arthropathy at the first MTP joint and between the sesamoids and first metatarsal head. 5. The patient truncated the exam by refusing further imaging after 3 series had been obtained. Electronically Signed: By: Gaylyn Rong M.D. On: 04/22/2020 09:11   US Venous Img Lower  Left (DVT Study)  Result Date: 04/21/2020 CLINICAL DATA:  Plantar wound, swelling EXAM: LEFT LOWER EXTREMITY VENOUS DOPPLER ULTRASOUND TECHNIQUE: Gray-scale sonography with compression, as well as color and duplex ultrasound, were performed to evaluate the deep venous system(s) from the level of the common femoral vein through the popliteal and proximal calf veins. COMPARISON:  None. FINDINGS: VENOUS Normal compressibility of the common femoral, superficial femoral, and popliteal veins, as well as  the visualized calf veins. Visualized portions of profunda femoral vein and great saphenous vein unremarkable. No filling defects to suggest DVT on grayscale or color Doppler imaging. Doppler waveforms show normal direction of venous flow, normal respiratory phasicity and response to augmentation. Limited views of the contralateral common femoral vein are unremarkable. OTHER Subcutaneous edema in the calf. Prominent left inguinal lymph nodes up to 1.4 cm short axis diameter. Limitations: Technologist describes technically difficult study of the peroneal vein secondary to edema. IMPRESSION: No femoropopliteal DVT nor evidence of DVT within the visualized calf veins. If clinical symptoms are inconsistent or if there are persistent or worsening symptoms, further imaging (possibly involving the iliac veins) may be warranted. Electronically Signed   By: Corlis Leak M.D.   On: 04/21/2020 15:01   DG Foot 2 Views Left  Result Date: 04/21/2020 CLINICAL DATA:  Plantar wound,  leg swelling, infection EXAM: LEFT FOOT - 2 VIEW COMPARISON:  04/15/2020 FINDINGS: No fracture or dislocation of the left foot. Moderate midfoot arthrosis. There is diffuse soft tissue edema about the forefoot. No radiopaque foreign body. IMPRESSION: 1. No fracture or dislocation of the left foot. No radiographic evidence of bony erosion or sclerosis to suggest osteomyelitis. Consider MRI to more sensitively evaluate for bone marrow edema and osteomyelitis if clinically suspected. 2. Diffuse soft tissue edema about the forefoot. No radiopaque foreign body. Electronically Signed   By: Eddie Candle M.D.   On: 04/21/2020 12:24     CODE STATUS:     Code Status Orders  (From admission, onward)         Start     Ordered   04/21/20 1911  Full code  Continuous     04/21/20 1910        Code Status History    Date Active Date Inactive Code Status Order ID Comments User Context   07/29/2019 1726 07/30/2019 1824 Full Code [id number]  Henreitta Leber, MD Inpatient   10/20/2017 2210 10/27/2017 1649 Full Code [id number]  Gonzella Lex, MD Inpatient   02/17/2017 0436 02/26/2017 1547 Full Code [id number]  Gonzella Lex, MD Inpatient   01/08/2017 0905 01/08/2017 1839 Full Code [id number]  Harrie Foreman, MD Inpatient   05/07/2016 2120 05/12/2016 1613 Full Code [id number]  Gonzella Lex, MD Inpatient   01/20/2016 0042 01/24/2016 1809 Full Code [id number]  Rainey Pines, MD Inpatient   Advance Care Planning Activity       TOTAL TIME TAKING CARE OF THIS PATIENT: *40* minutes.    Fritzi Mandes M.D  [facility]    CC: Primary care physician; Jodi Marble, MD

## 2020-04-23 NOTE — Plan of Care (Signed)
  Problem: Clinical Measurements: Goal: Ability to avoid or minimize complications of infection will improve Outcome: Adequate for Discharge   Problem: Skin Integrity: Goal: Skin integrity will improve Outcome: Adequate for Discharge   Problem: Education: Goal: Knowledge of General Education information will improve Description: Including pain rating scale, medication(s)/side effects and non-pharmacologic comfort measures Outcome: Adequate for Discharge   Problem: Health Behavior/Discharge Planning: Goal: Ability to manage health-related needs will improve Outcome: Adequate for Discharge   Problem: Clinical Measurements: Goal: Ability to maintain clinical measurements within normal limits will improve Outcome: Adequate for Discharge Goal: Will remain free from infection Outcome: Adequate for Discharge Goal: Diagnostic test results will improve Outcome: Adequate for Discharge Goal: Respiratory complications will improve Outcome: Adequate for Discharge Goal: Cardiovascular complication will be avoided Outcome: Adequate for Discharge   Problem: Activity: Goal: Risk for activity intolerance will decrease Outcome: Adequate for Discharge   Problem: Nutrition: Goal: Adequate nutrition will be maintained Outcome: Adequate for Discharge   Problem: Coping: Goal: Level of anxiety will decrease Outcome: Adequate for Discharge   Problem: Elimination: Goal: Will not experience complications related to bowel motility Outcome: Adequate for Discharge Goal: Will not experience complications related to urinary retention Outcome: Adequate for Discharge   Problem: Pain Managment: Goal: General experience of comfort will improve Outcome: Adequate for Discharge   Problem: Safety: Goal: Ability to remain free from injury will improve Outcome: Adequate for Discharge   Problem: Skin Integrity: Goal: Risk for impaired skin integrity will decrease Outcome: Adequate for Discharge   

## 2020-05-24 ENCOUNTER — Ambulatory Visit: Payer: Medicare Other | Admitting: Podiatry

## 2020-06-14 ENCOUNTER — Other Ambulatory Visit: Payer: Self-pay

## 2020-06-14 ENCOUNTER — Ambulatory Visit (INDEPENDENT_AMBULATORY_CARE_PROVIDER_SITE_OTHER): Payer: Medicare Other | Admitting: Podiatry

## 2020-06-14 ENCOUNTER — Encounter: Payer: Self-pay | Admitting: Podiatry

## 2020-06-14 DIAGNOSIS — B351 Tinea unguium: Secondary | ICD-10-CM

## 2020-06-14 DIAGNOSIS — M79675 Pain in left toe(s): Secondary | ICD-10-CM

## 2020-06-14 DIAGNOSIS — E119 Type 2 diabetes mellitus without complications: Secondary | ICD-10-CM

## 2020-06-14 DIAGNOSIS — M79674 Pain in right toe(s): Secondary | ICD-10-CM | POA: Diagnosis not present

## 2020-06-14 NOTE — Progress Notes (Signed)
This patient returns to my office for at risk foot care.  This patient requires this care by a professional since this patient will be at risk due to having  diabetes.  This patient is unable to cut nails himself since the patient cannot reach his nails.These nails are painful walking and wearing shoes.  This patient presents for at risk foot care today.  General Appearance  Alert, conversant and in no acute stress.  Vascular  Dorsalis pedis and posterior tibial  pulses are palpable  bilaterally.  Capillary return is within normal limits  bilaterally. Temperature is within normal limits  bilaterally.  Neurologic  Senn-Weinstein monofilament wire test within normal limits  bilaterally. Muscle power within normal limits bilaterally.  Nails Thick disfigured discolored nails with subungual debris  from hallux to fifth toes bilaterally. No evidence of bacterial infection or drainage bilaterally.  Orthopedic  No limitations of motion  feet .  No crepitus or effusions noted.  No bony pathology or digital deformities noted.  Skin  normotropic skin with no porokeratosis noted bilaterally.  No signs of infections or ulcers noted.     Onychomycosis  Pain in right toes  Pain in left toes  Consent was obtained for treatment procedures.   Mechanical debridement of nails 1-5  bilaterally performed with a nail nipper.  Filed with dremel without incident.    Return office visit   3 months                   Told patient to return for periodic foot care and evaluation due to potential at risk complications.   Mannat Benedetti DPM   

## 2020-09-05 IMAGING — DX DG CHEST 1V PORT
1 series · 1 of 1 positions shown · non-contrast
Comparison: 07/29/2019

CLINICAL DATA: Dyspnea

EXAM:
PORTABLE CHEST 1 VIEW

[chest ap]
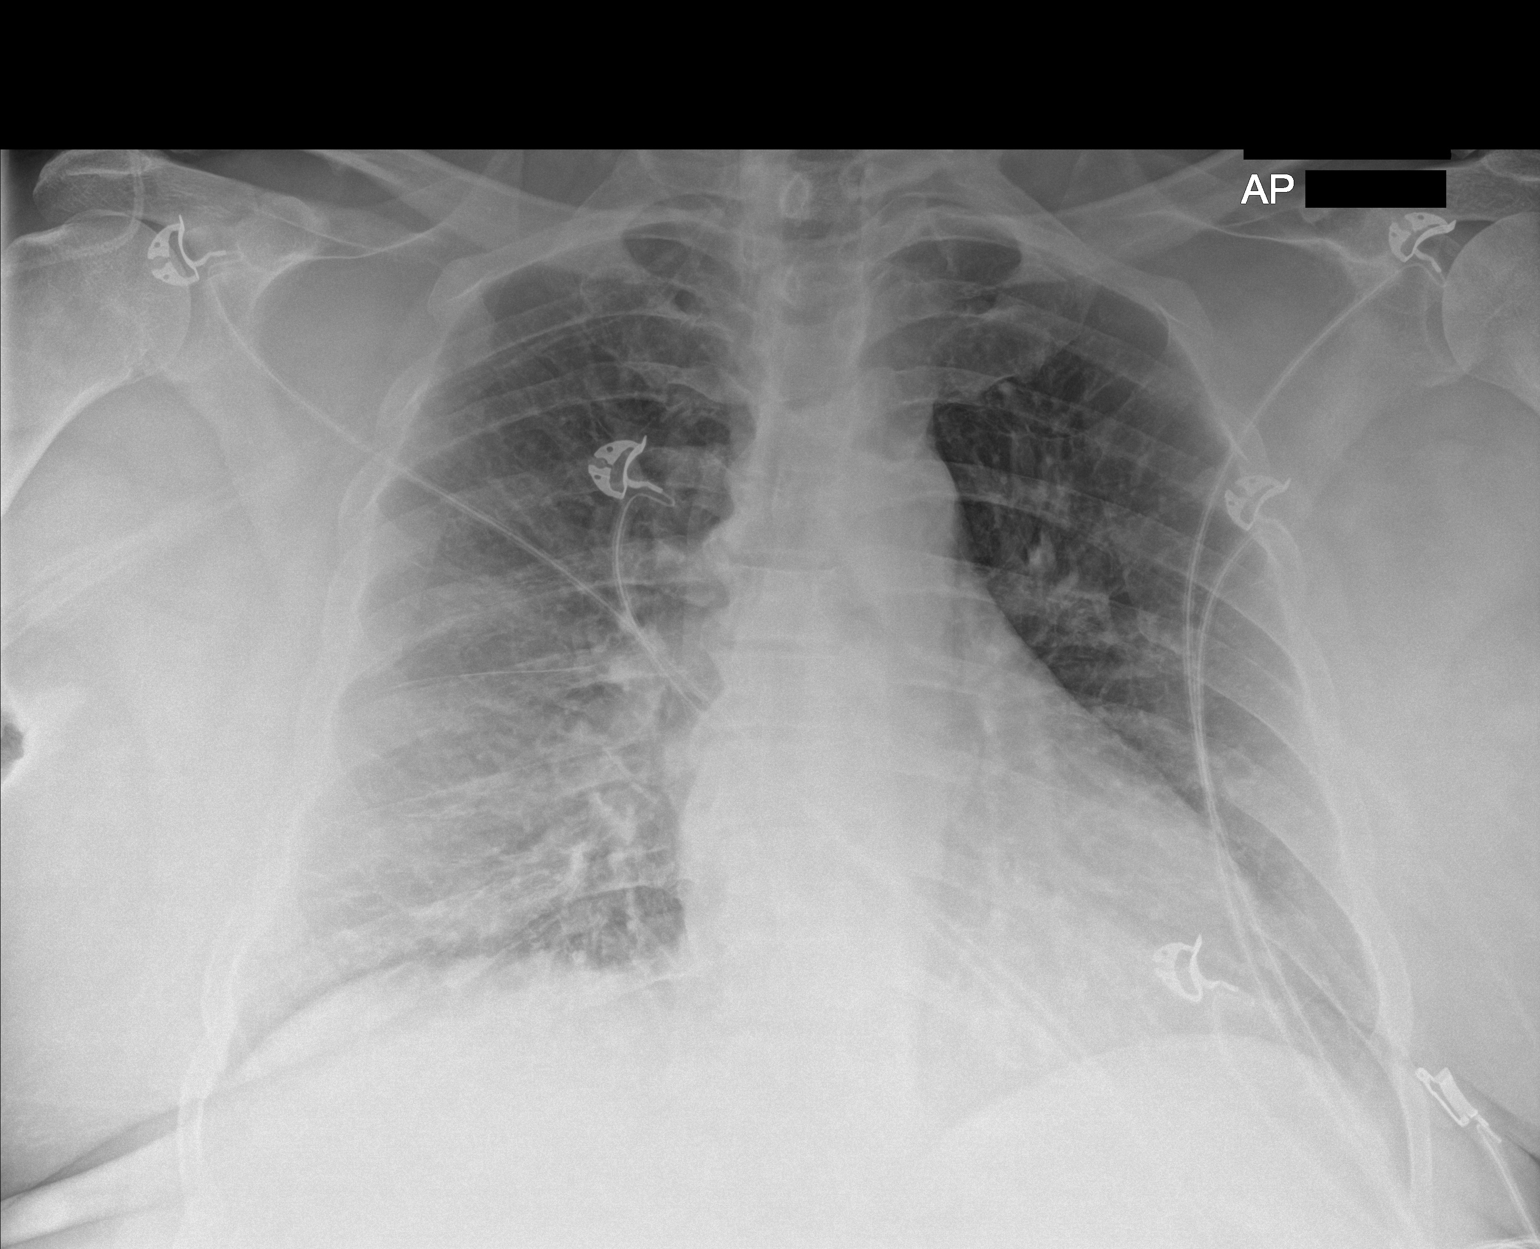

[1 of 1 positions shown; findings below may reference images not displayed]

FINDINGS: Normal cardiomediastinal contours. Mild diffuse interstitial
opacity. Lungs are mildly hyperinflated. No focal airspace
consolidation. No pleural effusion or pneumothorax.
IMPRESSION: COPD without acute airspace disease.

## 2020-09-17 ENCOUNTER — Ambulatory Visit: Payer: Medicare Other | Admitting: Podiatry

## 2021-03-13 IMAGING — US US EXTREM LOW VENOUS*L*
1 series · 13 of 24 positions shown · non-contrast
Comparison: None.

CLINICAL DATA: Plantar wound, swelling

EXAM:
LEFT LOWER EXTREMITY VENOUS DOPPLER ULTRASOUND
TECHNIQUE: Gray-scale sonography with compression, as well as color and duplex
ultrasound, were performed to evaluate the deep venous system(s)
from the level of the common femoral vein through the popliteal and
proximal calf veins.

[Series 1: us venous img lower uni left (dvt) · portal-venous · 13 of 40 slices shown]
[im 1/40]
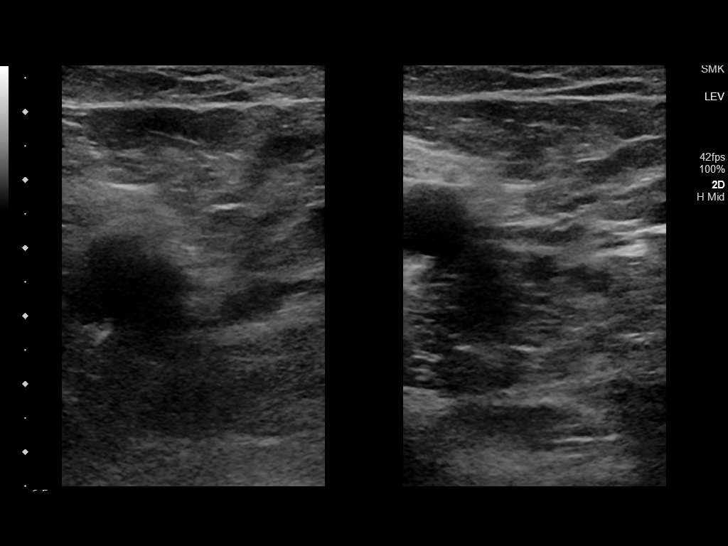
[im 4/40]
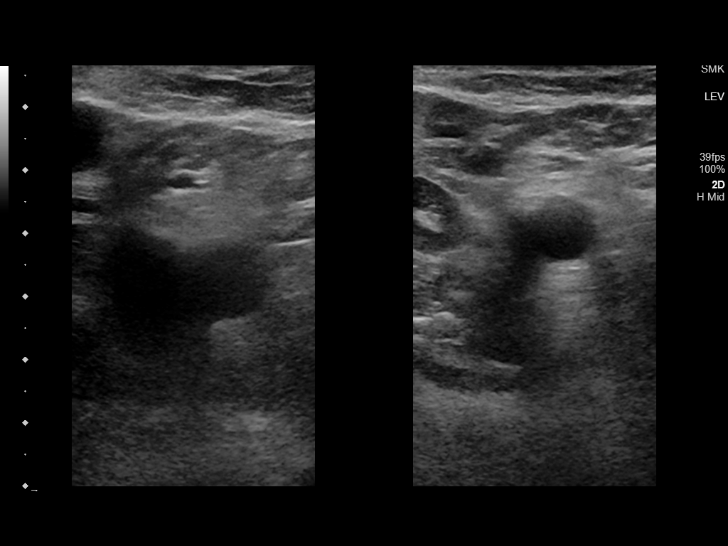
[im 7/40]
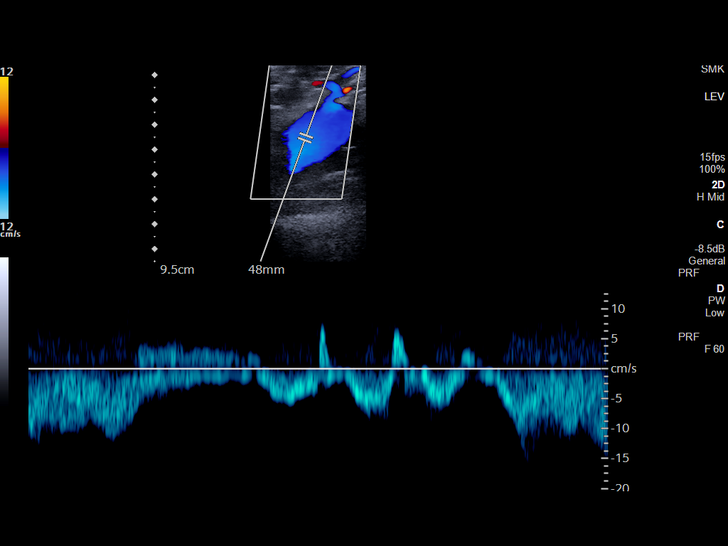
[im 11/40]
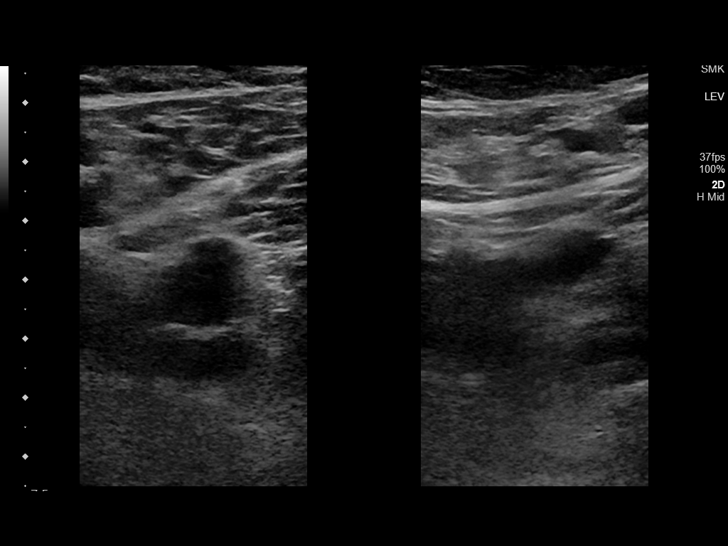
[im 14/40]
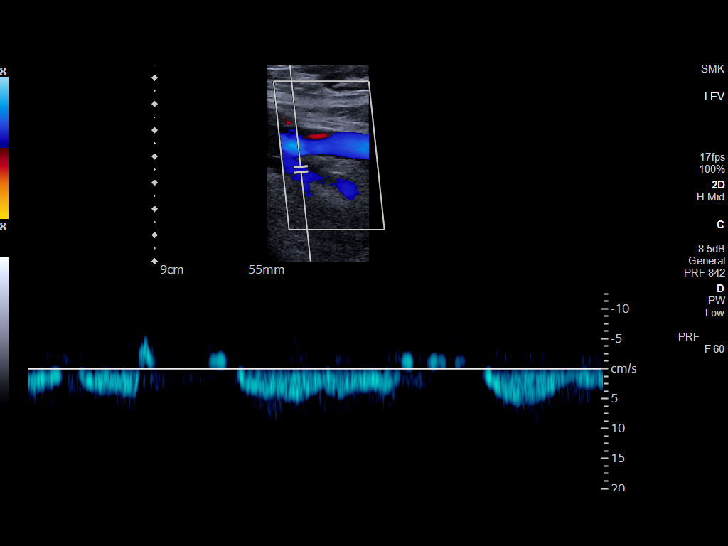
[im 17/40]
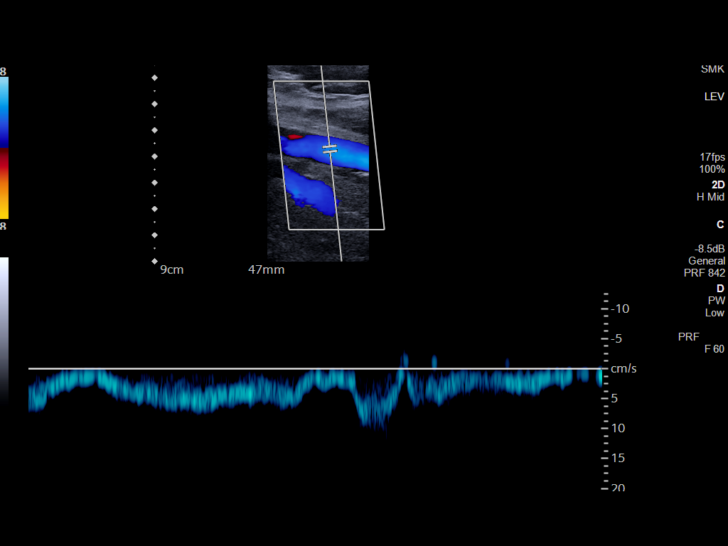
[im 21/40]
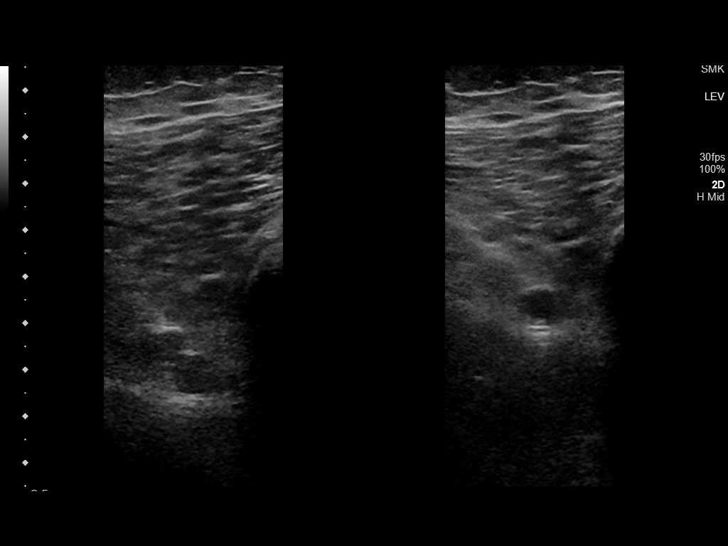
[im 23/40]
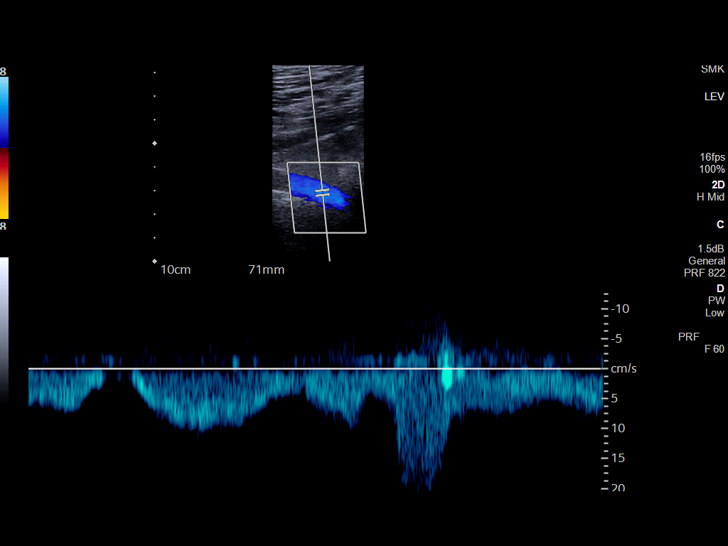
[im 26/40]
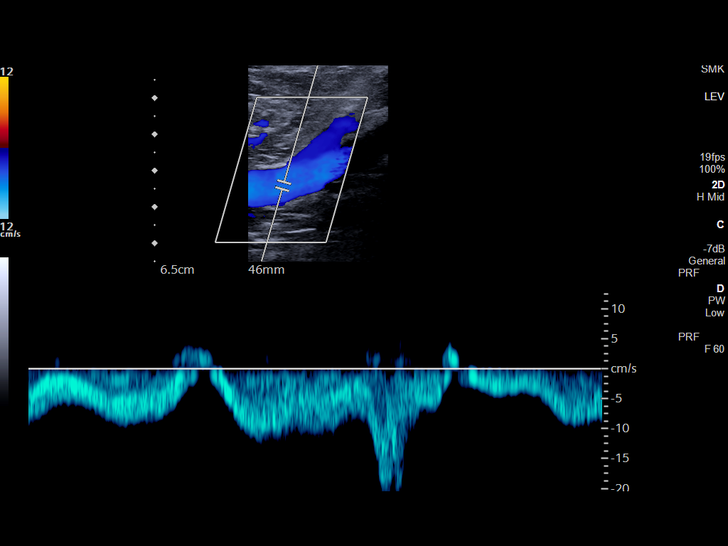
[im 29/40]
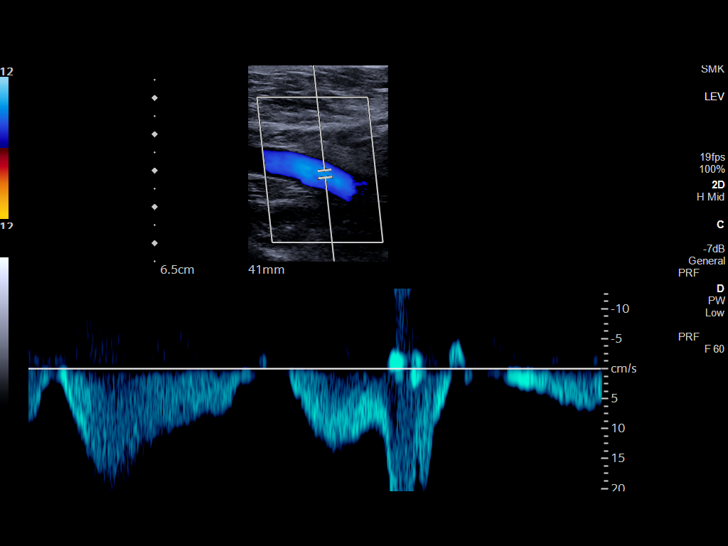
[im 33/40]
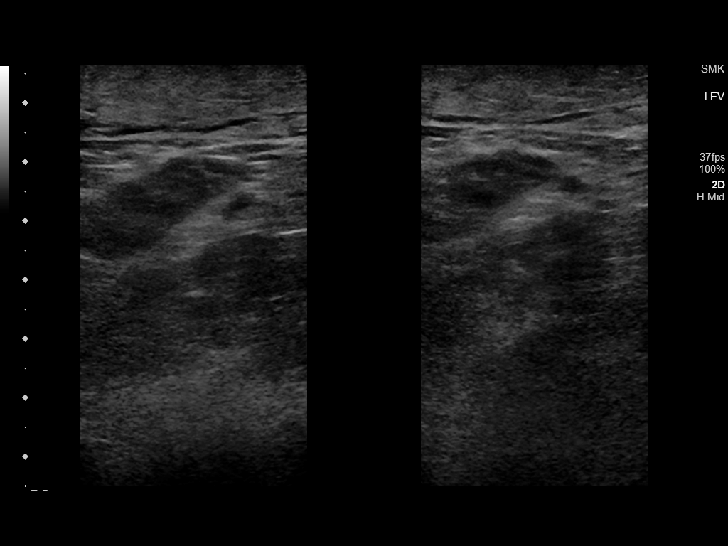
[im 36/40]
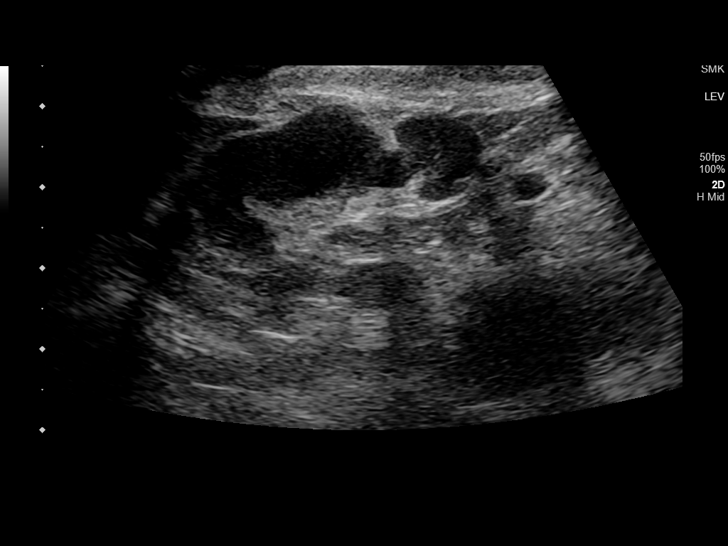
[im 40/40]
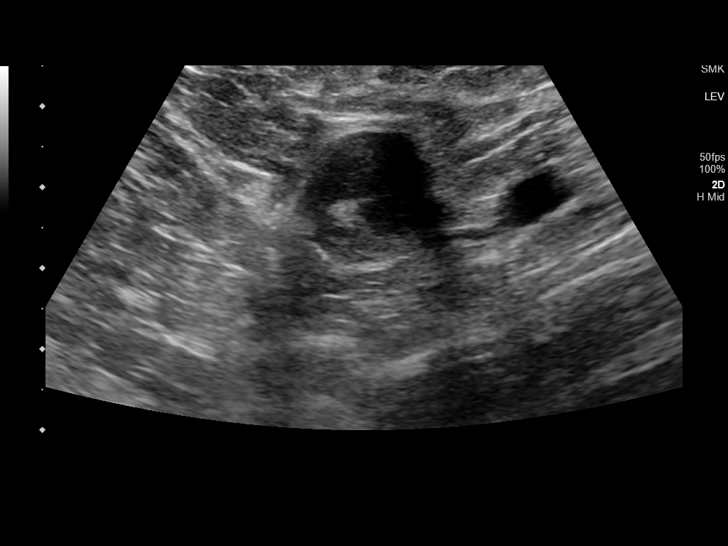

[13 of 24 positions shown; findings below may reference images not displayed]

FINDINGS: VENOUS

Normal compressibility of the common femoral, superficial femoral,
and popliteal veins, as well as the visualized calf veins.
Visualized portions of profunda femoral vein and great saphenous
vein unremarkable. No filling defects to suggest DVT on grayscale or
color Doppler imaging. Doppler waveforms show normal direction of
venous flow, normal respiratory phasicity and response to
augmentation.

Limited views of the contralateral common femoral vein are
unremarkable.

OTHER

Subcutaneous edema in the calf. Prominent left inguinal lymph nodes
up to 1.4 cm short axis diameter.

Limitations: Technologist describes technically difficult study of
the peroneal vein secondary to edema.
IMPRESSION: No femoropopliteal DVT nor evidence of DVT within the visualized
calf veins.

If clinical symptoms are inconsistent or if there are persistent or
worsening symptoms, further imaging (possibly involving the iliac
veins) may be warranted.

## 2022-04-18 DIAGNOSIS — L732 Hidradenitis suppurativa: Secondary | ICD-10-CM | POA: Diagnosis present

## 2023-09-29 NOTE — Progress Notes (Signed)
 Visit Discharge information Discharge condition stable Ambulatory status Ambulatory Discharge destination prison Transportation Private Accompanied by prison guard Pain at discharge 6

## 2023-12-02 NOTE — Progress Notes (Signed)
 Initial Visit Note     Assessment:  Sacral, bilateral buttock/posterior thigh, and groin wound status post I&D/debridement of hidradenitis     Plan: Sacral, bilateral buttock/posterior thigh, and groin wounds are all well granulated.  No surgical debridement required today.  No cellulitis.  No evidence for undrained abscess Bilateral axilla without active hidradenitis Vashe wash Silver alginate dressings Continue minocycline Juven protein/collagen supplementation Continue offloading measures Return to clinic in 1 to 2 months  I personally spent 55 minutes face-to-face and non-face-to-face in the care of this patient, which includes all pre, intra, and post visit time on the date of service.  All documented time was specific to the E/M visit and does not include any procedures that may have been performed.     History of Present Illness:  Reason for Consult: Bilateral buttock/thigh, sacrum, and groin wound status post I&D/debridement for hidradenitis  Walter Pena is a 43 y.o. year old male who is seen in consultation at the request of Gaither Coleman Brink,* for evaluation of multiple wounds related to hidradenitis.  Onset of symptoms was 11/06/2023.  The symptoms are located in the bilateral buttocks.  The patient describes the symptoms as dull.  The symptoms are intermittent.  The symptoms do not radiate.  The patient rates the symptoms as moderate to severe.   PMH significant for type 2 diabetes (A1c 5.7 in December 2024), HIV, hidradenitis, COPD, CHF, gout, Stevens-Johnson syndrome (secondary to allopurinol  and meloxicam) He underwent bilateral buttock/thigh/groin I&D/debridement on 11/06/2023 at [facility] records available applying Hibiclens, benzyl peroxide, and topical erythromycin per dermatology note On minocycline per prison documentation.  No cultures available in epic He reports progressive improvement Tolerating a regular diet.  Supplementing with  Juven Moderate to severe pain with pressure per his baseline.  Moderate drainage.  No fever or chills  Allergies  Allopurinol , Allopurinol  analogues, Lisinopril, Meloxicam, Sulfamethoxazole-trimethoprim, Clindamycin , Thiazides, and Aloe   Medications    Current Outpatient Medications  Medication Sig Dispense Refill  . acetaminophen  (TYLENOL ) 325 MG tablet Take 2 tablets (650 mg total) by mouth every six (6) hours as needed for pain.    . albuterol  (ACCUNEB ) 1.25 mg/3 mL nebulizer solution Inhale 3 mL (1.25 mg total) by nebulization every six (6) hours as needed for wheezing.    . albuterol  HFA 90 mcg/actuation inhaler Inhale 2 puffs every six (6) hours as needed for wheezing.    . benzoyl peroxide 5 % external liquid use every day or every other day to wash affected areas 236 mL 5  . chlorhexidine (HIBICLENS) 4 % external liquid Use in shower as a wash 3-4 times weekly 120 mL 11  . chlorhexidine (HIBICLENS) 4 % external liquid use once a day or every other day to wash affected areas of HS 236 mL 5  . chlorthalidone (HYGROTON) 25 MG tablet Take 1 tablet (25 mg total) by mouth daily.    . clindamycin  (CLEOCIN  T) 1 % lotion Apply twice daily to oil prone areas. Lotion not solution (Solution would be too difficult to apply). 60 mL 5  . clotrimazole (LOTRIMIN) 1 % cream Apply 1 Application topically in the morning.    . famotidine  (PEPCID ) 20 MG tablet Take 1 tablet (20 mg total) by mouth two (2) times a day.    . fluticasone propion-salmeterol (ADVAIR HFA) 115-21 mcg/actuation inhaler Inhale 2 puffs two (2) times a day.    . hydroCHLOROthiazide (HYDRODIURIL) 25 MG tablet Take 1 tablet (25 mg total) by mouth  daily.    . inFLIXimab (REMICADE) 100 mg injection Please give 10mg /kg IV on 0, 2, and 6 weeks as loading dose then every 4 weeks thereafter as maintenance dose. Premedications and monitoring per infusion center protocol. 1 each 0  . metFORMIN  (GLUCOPHAGE ) 500 MG tablet Take 1 tablet (500 mg  total) by mouth in the morning.    . metoPROLOL tartrate (LOPRESSOR) 25 MG tablet Take 0.5 tablets (12.5 mg total) by mouth two (2) times a day.    . minocycline (MINOCIN) 100 MG capsule Take 1 capsule (100 mg total) by mouth two (2) times a day. 60 capsule 2  . NIFEdipine (ADALAT CC) 30 MG 24 hr tablet Take 1 tablet (30 mg total) by mouth daily.    . valsartan (DIOVAN) 40 MG tablet Take 1 tablet (40 mg total) by mouth two (2) times a day.     No current facility-administered medications for this visit.     Past Medical History  Past Medical History:  Diagnosis Date  . Anxiety   . Arthritis   . At risk for falls   . CHF (congestive heart failure) (CMS-HCC)   . COPD (chronic obstructive pulmonary disease) (CMS-HCC)   . Diabetes mellitus (CMS-HCC)   . Problem with transportation   . Seizures (CMS-HCC)      Past Surgical History  Past Surgical History:  Procedure Laterality Date  . SKIN BIOPSY       Family History  Family History  Problem Relation Age of Onset  . Melanoma Neg Hx   . Basal cell carcinoma Neg Hx   . Squamous cell carcinoma Neg Hx   . Cancer Neg Hx      Social History:  Social History   Socioeconomic History  . Marital status: Single  Tobacco Use  . Smoking status: Some Days    Types: Cigarettes  . Smokeless tobacco: Never  Vaping Use  . Vaping status: Never Used  Substance and Sexual Activity  . Alcohol use: Not Currently  . Drug use: Not Currently  Other Topics Concern  . Do you use sunscreen? No  . Tanning bed use? No  . Are you easily burned? No  . Excessive sun exposure? No  . Blistering sunburns? No    Review of Systems  Review of Systems  Constitutional:  Positive for chills and unexpected weight change.  HENT: Negative.    Eyes: Negative.   Respiratory:  Positive for shortness of breath and wheezing.   Gastrointestinal: Negative.   Endocrine: Negative.   Musculoskeletal:  Positive for arthralgias, back pain, gait problem  and myalgias.  Skin:  Positive for wound.  Allergic/Immunologic: Positive for food allergies.  Hematological: Negative.   Psychiatric/Behavioral: Negative.          Vital Signs  BP 113/63 (BP Site: L Arm, BP Position: Sitting, BP Cuff Size: Medium)   Pulse 56   Temp 36.7 C (98.1 F) (Temporal)   Resp 18   Ht 172.7 cm (5' 8)   Wt 74.4 kg (164 lb)   BMI 24.94 kg/m  Body mass index is 24.94 kg/m.   Physical Exam  General Appearance:  No acute distress, well appearing and well nourished.  Head:  Normocephalic, atraumatic.  Eyes:  Conjuctiva and lids appear normal. Pupils equal and round,  sclera anicteric.  Ears:  Overall appearance normal with no scars, lesions or               masses.  Hearing is grossly normal.  Nose: Nares grossly normal, no drainage.  Throat: Lips, mucosa, and tongue normal; teeth and gums normal.  Neck: Supple, symmetrical, trachea midline, no adenopathy;  thyroid :  no enlargement/tenderness/nodules.  Pulmonary:    Normal respiratory effort.  Lungs were clear to auscultation bilaterally.  Cardiovascular:  Regular rate and rhythm, no murmur noted.  No thrill noted.  Abdomen:   Soft, non-tender, without masses.  No                                      hepatosplenomegaly. No hernias appreciated.  Musculoskeletal: See below  Skin: See below  Neurologic: No motor abnormalities noted.  Sensation grossly intact.  Psychiatric: Judgement and insight appropriate.  Oriented to person,         place,  and time.  Sacral, bilateral buttock/posterior thigh, and groin wounds are all well granulated.  No surgical debridement required today.  No cellulitis.  No evidence for undrained abscess Bilateral axilla without active hidradenitis   Wound Assessment  Wound 09/29/23 Buttocks Left (Active)     Wound 09/29/23 Buttocks Right (Active)  Wound Image   12/02/23 0844  Wound Length (cm) 4.5 cm 12/02/23 0844  Wound Width (cm) 1.5 cm 12/02/23 0844  Wound Depth (cm)  0.8 cm 12/02/23 0844  Wound Surface Area (cm^2) 6.75 cm^2 12/02/23 0844  Wound Volume (cm^3) 5.4 cm^3 12/02/23 0844  Wound Healing % -3 12/02/23 0844     Wound 09/29/23 Groin (Active)     Wound 09/29/23 Axilla Right (Active)     Wound 09/29/23 Axilla Left (Active)     Wound 12/02/23 Groin Left (Active)  Wound Image   12/02/23 0839  Wound Length (cm) 0.5 cm 12/02/23 0839  Wound Width (cm) 0.5 cm 12/02/23 0839  Wound Depth (cm) 0.1 cm 12/02/23 0839  Wound Surface Area (cm^2) 0.25 cm^2 12/02/23 0839  Wound Volume (cm^3) 0.025 cm^3 12/02/23 0839     Wound 12/02/23 Buttocks Left (Active)  Wound Image   12/02/23 0844  Wound Length (cm) 15 cm 12/02/23 0844  Wound Width (cm) 3 cm 12/02/23 0844  Wound Depth (cm) 0.2 cm 12/02/23 0844  Wound Surface Area (cm^2) 45 cm^2 12/02/23 0844  Wound Volume (cm^3) 9 cm^3 12/02/23 0844     Wound 12/02/23 Buttocks Right (Active)  Wound Image   12/02/23 0844  Wound Length (cm) 23 cm 12/02/23 0844  Wound Width (cm) 4.5 cm 12/02/23 0844  Wound Depth (cm) 0.2 cm 12/02/23 0844  Wound Surface Area (cm^2) 103.5 cm^2 12/02/23 0844  Wound Volume (cm^3) 20.7 cm^3 12/02/23 0844     Wound 12/02/23 Sacrum (Active)       Test Results  I have reviewed all pertinenet labs.  Imaging:  Radiology studies were personally reviewed   Problem List  Patient Active Problem List  Diagnosis  . Hidradenitis suppurativa

## 2024-09-22 ENCOUNTER — Other Ambulatory Visit: Payer: Self-pay

## 2024-09-22 ENCOUNTER — Inpatient Hospital Stay
Admission: EM | Admit: 2024-09-22 | Discharge: 2024-09-23 | DRG: 607 | Disposition: A | Discharge status: Other Acute Inpatient Hospital | Attending: Obstetrics and Gynecology | Admitting: Obstetrics and Gynecology

## 2024-09-22 ENCOUNTER — Emergency Department

## 2024-09-22 DIAGNOSIS — Z5948 Other specified lack of adequate food: Secondary | ICD-10-CM

## 2024-09-22 DIAGNOSIS — L0291 Cutaneous abscess, unspecified: Secondary | ICD-10-CM

## 2024-09-22 DIAGNOSIS — Z881 Allergy status to other antibiotic agents status: Secondary | ICD-10-CM

## 2024-09-22 DIAGNOSIS — Z833 Family history of diabetes mellitus: Secondary | ICD-10-CM

## 2024-09-22 DIAGNOSIS — Z79899 Other long term (current) drug therapy: Secondary | ICD-10-CM

## 2024-09-22 DIAGNOSIS — Z7984 Long term (current) use of oral hypoglycemic drugs: Secondary | ICD-10-CM

## 2024-09-22 DIAGNOSIS — F17210 Nicotine dependence, cigarettes, uncomplicated: Secondary | ICD-10-CM | POA: Diagnosis present

## 2024-09-22 DIAGNOSIS — I110 Hypertensive heart disease with heart failure: Secondary | ICD-10-CM | POA: Diagnosis present

## 2024-09-22 DIAGNOSIS — Z886 Allergy status to analgesic agent status: Secondary | ICD-10-CM

## 2024-09-22 DIAGNOSIS — Z888 Allergy status to other drugs, medicaments and biological substances status: Secondary | ICD-10-CM

## 2024-09-22 DIAGNOSIS — F1020 Alcohol dependence, uncomplicated: Secondary | ICD-10-CM | POA: Diagnosis present

## 2024-09-22 DIAGNOSIS — F200 Paranoid schizophrenia: Secondary | ICD-10-CM | POA: Diagnosis present

## 2024-09-22 DIAGNOSIS — L02214 Cutaneous abscess of groin: Secondary | ICD-10-CM | POA: Diagnosis present

## 2024-09-22 DIAGNOSIS — Z91048 Other nonmedicinal substance allergy status: Secondary | ICD-10-CM

## 2024-09-22 DIAGNOSIS — J449 Chronic obstructive pulmonary disease, unspecified: Secondary | ICD-10-CM | POA: Diagnosis present

## 2024-09-22 DIAGNOSIS — Z79891 Long term (current) use of opiate analgesic: Secondary | ICD-10-CM

## 2024-09-22 DIAGNOSIS — L732 Hidradenitis suppurativa: Principal | ICD-10-CM | POA: Diagnosis present

## 2024-09-22 DIAGNOSIS — L03315 Cellulitis of perineum: Principal | ICD-10-CM | POA: Diagnosis present

## 2024-09-22 DIAGNOSIS — R462 Strange and inexplicable behavior: Secondary | ICD-10-CM | POA: Diagnosis not present

## 2024-09-22 DIAGNOSIS — Z5941 Food insecurity: Secondary | ICD-10-CM

## 2024-09-22 DIAGNOSIS — L02215 Cutaneous abscess of perineum: Secondary | ICD-10-CM | POA: Diagnosis present

## 2024-09-22 DIAGNOSIS — R4689 Other symptoms and signs involving appearance and behavior: Secondary | ICD-10-CM

## 2024-09-22 DIAGNOSIS — E876 Hypokalemia: Secondary | ICD-10-CM | POA: Diagnosis present

## 2024-09-22 DIAGNOSIS — Z882 Allergy status to sulfonamides status: Secondary | ICD-10-CM

## 2024-09-22 DIAGNOSIS — E119 Type 2 diabetes mellitus without complications: Secondary | ICD-10-CM | POA: Diagnosis present

## 2024-09-22 DIAGNOSIS — I10 Essential (primary) hypertension: Secondary | ICD-10-CM | POA: Diagnosis present

## 2024-09-22 DIAGNOSIS — Z046 Encounter for general psychiatric examination, requested by authority: Secondary | ICD-10-CM

## 2024-09-22 DIAGNOSIS — Z91199 Patient's noncompliance with other medical treatment and regimen due to unspecified reason: Secondary | ICD-10-CM

## 2024-09-22 DIAGNOSIS — Z8249 Family history of ischemic heart disease and other diseases of the circulatory system: Secondary | ICD-10-CM

## 2024-09-22 DIAGNOSIS — S31809A Unspecified open wound of unspecified buttock, initial encounter: Principal | ICD-10-CM

## 2024-09-22 DIAGNOSIS — D649 Anemia, unspecified: Secondary | ICD-10-CM | POA: Diagnosis present

## 2024-09-22 DIAGNOSIS — L0390 Cellulitis, unspecified: Secondary | ICD-10-CM

## 2024-09-22 LAB — CBC
HCT: 38.7 % — ABNORMAL LOW (ref 39.0–52.0)
Hemoglobin: 12.6 g/dL — ABNORMAL LOW (ref 13.0–17.0)
MCH: 28.3 pg (ref 26.0–34.0)
MCHC: 32.6 g/dL (ref 30.0–36.0)
MCV: 86.8 fL (ref 80.0–100.0)
Platelets: 289 K/uL (ref 150–400)
RBC: 4.46 MIL/uL (ref 4.22–5.81)
RDW: 13.8 % (ref 11.5–15.5)
WBC: 14.2 K/uL — ABNORMAL HIGH (ref 4.0–10.5)
nRBC: 0 % (ref 0.0–0.2)

## 2024-09-22 LAB — COMPREHENSIVE METABOLIC PANEL WITH GFR
ALT: 18 U/L (ref 0–44)
AST: 19 U/L (ref 15–41)
Albumin: 3.4 g/dL — ABNORMAL LOW (ref 3.5–5.0)
Alkaline Phosphatase: 68 U/L (ref 38–126)
Anion gap: 10 (ref 5–15)
BUN: 20 mg/dL (ref 6–20)
CO2: 28 mmol/L (ref 22–32)
Calcium: 8.8 mg/dL — ABNORMAL LOW (ref 8.9–10.3)
Chloride: 98 mmol/L (ref 98–111)
Creatinine, Ser: 1.16 mg/dL (ref 0.61–1.24)
GFR, Estimated: >60 mL/min (ref >60)
Glucose, Bld: 103 mg/dL — ABNORMAL HIGH (ref 70–99)
Potassium: 3.3 mmol/L — ABNORMAL LOW (ref 3.5–5.1)
Sodium: 136 mmol/L (ref 135–145)
Total Bilirubin: 0.9 mg/dL (ref 0.0–1.2)
Total Protein: 10.1 g/dL — ABNORMAL HIGH (ref 6.5–8.1)

## 2024-09-22 LAB — URINE DRUG SCREEN, QUALITATIVE (ARMC ONLY)
Amphetamines, Ur Screen: NOT DETECTED
Barbiturates, Ur Screen: NOT DETECTED
Benzodiazepine, Ur Scrn: NOT DETECTED
Cannabinoid 50 Ng, Ur ~~LOC~~: NOT DETECTED
Cocaine Metabolite,Ur ~~LOC~~: NOT DETECTED
MDMA (Ecstasy)Ur Screen: NOT DETECTED
Methadone Scn, Ur: NOT DETECTED
Opiate, Ur Screen: NOT DETECTED
Phencyclidine (PCP) Ur S: NOT DETECTED
Tricyclic, Ur Screen: NOT DETECTED

## 2024-09-22 LAB — ACETAMINOPHEN LEVEL: Acetaminophen (Tylenol), Serum: <10 ug/mL — ABNORMAL LOW (ref 10–30)

## 2024-09-22 LAB — SALICYLATE LEVEL: Salicylate Lvl: <7 mg/dL — ABNORMAL LOW (ref 7.0–30.0)

## 2024-09-22 LAB — ETHANOL: Alcohol, Ethyl (B): <15 mg/dL (ref <15)

## 2024-09-22 MED ORDER — IOHEXOL 300 MG/ML  SOLN
100.0000 mL | Freq: Once | INTRAMUSCULAR | Status: AC | PRN
  Administered 2024-09-22: 100 mL via INTRAVENOUS

## 2024-09-22 MED ORDER — SODIUM CHLORIDE 0.9 % IV SOLN: INTRAVENOUS | Status: DC

## 2024-09-22 MED ORDER — SODIUM CHLORIDE 0.9 % IV SOLN
2.0000 g | Freq: Once | INTRAVENOUS | Status: AC
  Administered 2024-09-22: 2 g via INTRAVENOUS
  Filled 2024-09-22: qty 20

## 2024-09-22 NOTE — ED Notes (Addendum)
 Patient belongings: 2 black shoes 1 black hoodie 1 black sweat pants 1 black t shirt 1 blue underwear 1 cellphone 1 wallet

## 2024-09-22 NOTE — ED Provider Notes (Signed)
 [facility] Provider Note    Event Date/Time   First MD Initiated Contact with Patient 09/22/24 2151     (approximate)   History   IVC   HPI  Walter Pena. is a 43 year old male with history of schizophrenia, T2DM, hidradenitis presenting to the emergency department for evaluation under IVC for erratic behavior, also reporting wounds around his groin.  No fevers.  No abdominal pain. Patient reports he has a history of hidradenitis with associated abscesses.  Reports that about a year ago he had surgery in his perineal area for this.  More recently the area has worsened. Denies SI, HI.   Reviewed IVC, notes history of schizophrenia and that patient was threatening to hurt others with erratic behavior.  Reviewed wound care visit from 07/05/2024.  Patient evaluated for wounds of his buttocks, posterior thighs, left groin and sacral area at that time.     Physical Exam   Triage Vital Signs: ED Triage Vitals [09/22/24 2125]  Encounter Vitals Group     BP (!) 126/98     Girls Systolic BP Percentile      Girls Diastolic BP Percentile      Boys Systolic BP Percentile      Boys Diastolic BP Percentile      Pulse Rate 90     Resp 18     Temp 98.2 F (36.8 C)     Temp Source Oral     SpO2 99 %     Weight 165 lb (74.8 kg)     Height 5' 8 (1.727 m)     Head Circumference      Peak Flow      Pain Score 0     Pain Loc      Pain Education      Exclude from Growth Chart     Most recent vital signs: Vitals:   09/22/24 2125  BP: (!) 126/98  Pulse: 90  Resp: 18  Temp: 98.2 F (36.8 C)  SpO2: 99%     General: Awake, interactive  CV:  Good peripheral perfusion Resp:  Unlabored respirations Abd:  Nondistended, soft, nontender  Neuro:  Symmetric facial movement, fluid speech Other:   There is extensive scarring throughout the body.  In the perineal area there are multiple areas of swelling possibly fluctuant though may also be related to  scar tissue without obvious area of active drainage.  There is mild skin breakdown over the sacral area.There is overlying oozing purulent material in the area.    ED Results / Procedures / Treatments   Labs (all labs ordered are listed, but only abnormal results are displayed) Labs Reviewed  COMPREHENSIVE METABOLIC PANEL WITH GFR - Abnormal; Notable for the following components:      Result Value   Potassium 3.3 (*)    Glucose, Bld 103 (*)    Calcium 8.8 (*)    Total Protein 10.1 (*)    Albumin 3.4 (*)    All other components within normal limits  CBC - Abnormal; Notable for the following components:   WBC 14.2 (*)    Hemoglobin 12.6 (*)    HCT 38.7 (*)    All other components within normal limits  SALICYLATE LEVEL - Abnormal; Notable for the following components:   Salicylate Lvl <7.0 (*)    All other components within normal limits  ACETAMINOPHEN  LEVEL - Abnormal; Notable for the following components:   Acetaminophen  (Tylenol ), Serum <10 (*)  All other components within normal limits  CULTURE, BLOOD (ROUTINE X 2)  CULTURE, BLOOD (ROUTINE X 2)  ETHANOL  URINE DRUG SCREEN, QUALITATIVE ([facility] ONLY)  LACTIC ACID, PLASMA  LACTIC ACID, PLASMA     EKG EKG independently reviewed and interpreted by myself demonstrates:    RADIOLOGY Imaging independently reviewed and interpreted by myself demonstrates:   Formal Radiology Read:  CT PELVIS W CONTRAST Result Date: 09/22/2024 EXAM: CT PELVIS, WITH IV CONTRAST 09/22/2024 11:12:06 PM TECHNIQUE: Axial images were acquired through the pelvis with IV contrast. 100mL iohexol (OMNIPAQUE) 300 MG/ML solution was administered intravenously. Reformatted images were reviewed. Automated exposure control, iterative reconstruction, and/or weight based adjustment of the mA/kV was utilized to reduce the radiation dose to as low as reasonably achievable. COMPARISON: CT 11/08/2018 CLINICAL HISTORY: Perianal abscess or fistula suspected. FINDINGS:  BONES: No acute fracture or focal osseous lesion. JOINTS: No dislocation. The joint spaces are normal. SOFT TISSUES: Nodular soft tissue thickening along the gluteal cleft extending nferiorly into the perineum and into the posterior gluteal subcutaneous soft tissues. Inflammatory mass / developing abscess in the left perineum measuring 7.4 cm on series 4 image 141. Peripheral enhancing irregular collection within the subcutaneous fat in the midline caudal soft tissues measures 10.3 x 4.6 cm on series 5 image 112. Right inguinal lymphadenopathy measuring up to 14 mm in short axis is likely reactive. INTRAPELVIC CONTENTS: Limited images of the intrapelvic contents demonstrate no acute abnormality. IMPRESSION: 1. Cellulitis with developing abscesses in the perineum along the gluteal cleft and posterior midline subcutaneous gluteal fat. Electronically signed by: Norman Gatlin MD 09/22/2024 11:32 PM EST RP Workstation: [id number]    PROCEDURES:  Critical Care performed: No  Procedures   MEDICATIONS ORDERED IN ED: Medications  cefTRIAXone  (ROCEPHIN ) 2 g in sodium chloride  0.9 % 100 mL IVPB (has no administration in time range)  0.9 %  sodium chloride  infusion (has no administration in time range)  iohexol (OMNIPAQUE) 300 MG/ML solution 100 mL (100 mLs Intravenous Contrast Given 09/22/24 2306)     IMPRESSION / MDM / ASSESSMENT AND PLAN / ED COURSE  I reviewed the triage vital signs and the nursing notes.  Differential diagnosis includes, but is not limited to, perianal versus perirectal abscess secondary to hidradenitis, fistula, primary psychiatric disorder, substance-induced mood disorder  Patient's presentation is most consistent with acute presentation with potential threat to life or bodily function.  43 year old male presenting to the emergency department for evaluation under IVC for erratic behavior, also noted to have wounds in his perineal area.  Labs with leukocytosis WC of 14.2, mild  anemia, CMP without critical derangements.  On exam, patient does have extensive wounds in his perineal area.  There is some overlying purulent fluid, but no clear area of active drainage.  I am concerned about possible abscess  verse fistula.  CT pelvis ordered to further evaluate.  Signed out oncoming physician at 2315 pending CT, reevaluation, and disposition.  If CT demonstrates concerning findings, may require medical admission.  With this, IVC upheld, but will hold off on consultation of psychiatry or TTS.      FINAL CLINICAL IMPRESSION(S) / ED DIAGNOSES   Final diagnoses:  Wound of gluteal cleft, unspecified laterality, initial encounter  Threatening behavior     Rx / DC Orders   ED Discharge Orders     None        Note:  This document was prepared using Dragon voice recognition software and may include unintentional dictation errors.  Levander Slate, MD 09/22/24 [phone]

## 2024-09-22 NOTE — ED Triage Notes (Signed)
 Patient brought in via [employer] tonight under IVC. Paperwork states patient recently released from prison and has hx of schizophrenia, not taking any meds with escalations in behavior/ acting erratic. Patient states he does not need meds in triage, denies wanting to harm anyone or himself. He does endorse some wounds from previous boil surgery a year ago that is not healing.

## 2024-09-22 NOTE — ED Provider Notes (Signed)
 11:10 PM  Assumed care at shift change.  Patient here under IVC.  History of hidradenitis.  Multiple wounds that are draining to the perineal area.  CT of the pelvis pending.  11:44 PM  Spoke with Dr. Tye with general surgery.  12:32 AM  Consulted and discussed patient's case with hospitalist, Dr. Cleatus.  I have recommended admission and consulting physician agrees and will place admission orders.  Patient (and family if present) agree with this plan.   I reviewed all nursing notes, vitals, pertinent previous records.  All labs, EKGs, imaging ordered have been independently reviewed and interpreted by myself.   Patient does have a leukocytosis of 14,000.  Lactic normal.  Hemodynamically stable.  Cultures pending.  Patient does meet sepsis criteria with heart rate of 90 and leukocytosis.   CRITICAL CARE Performed by: Josette Sink   Total critical care time: 30 minutes  Critical care time was exclusive of separately billable procedures and treating other patients.  Critical care was necessary to treat or prevent imminent or life-threatening deterioration.  Critical care was time spent personally by me on the following activities: development of treatment plan with patient and/or surrogate as well as nursing, discussions with consultants, evaluation of patient's response to treatment, examination of patient, obtaining history from patient or surrogate, ordering and performing treatments and interventions, ordering and review of laboratory studies, ordering and review of radiographic studies, pulse oximetry and re-evaluation of patient's condition.    Walter Pena, Josette SAILOR, DO 09/23/24 [phone]

## 2024-09-23 ENCOUNTER — Encounter: Payer: Self-pay | Admitting: Internal Medicine

## 2024-09-23 ENCOUNTER — Inpatient Hospital Stay
Admission: AD | Admit: 2024-09-23 | Discharge: 2024-09-29 | DRG: 885 | Disposition: A | Discharge status: Home / Self Care | Source: Intra-hospital | Attending: Psychiatry | Admitting: Psychiatry

## 2024-09-23 DIAGNOSIS — L732 Hidradenitis suppurativa: Secondary | ICD-10-CM | POA: Diagnosis present

## 2024-09-23 DIAGNOSIS — F1020 Alcohol dependence, uncomplicated: Secondary | ICD-10-CM

## 2024-09-23 DIAGNOSIS — Z7984 Long term (current) use of oral hypoglycemic drugs: Secondary | ICD-10-CM

## 2024-09-23 DIAGNOSIS — F940 Selective mutism: Secondary | ICD-10-CM | POA: Diagnosis present

## 2024-09-23 DIAGNOSIS — F200 Paranoid schizophrenia: Principal | ICD-10-CM | POA: Diagnosis present

## 2024-09-23 DIAGNOSIS — Z046 Encounter for general psychiatric examination, requested by authority: Secondary | ICD-10-CM

## 2024-09-23 DIAGNOSIS — Z79899 Other long term (current) drug therapy: Secondary | ICD-10-CM | POA: Diagnosis not present

## 2024-09-23 DIAGNOSIS — L02215 Cutaneous abscess of perineum: Secondary | ICD-10-CM

## 2024-09-23 DIAGNOSIS — E876 Hypokalemia: Secondary | ICD-10-CM | POA: Diagnosis present

## 2024-09-23 DIAGNOSIS — F5104 Psychophysiologic insomnia: Secondary | ICD-10-CM | POA: Diagnosis present

## 2024-09-23 DIAGNOSIS — Z79891 Long term (current) use of opiate analgesic: Secondary | ICD-10-CM | POA: Diagnosis not present

## 2024-09-23 DIAGNOSIS — J449 Chronic obstructive pulmonary disease, unspecified: Secondary | ICD-10-CM | POA: Diagnosis present

## 2024-09-23 DIAGNOSIS — L03115 Cellulitis of right lower limb: Secondary | ICD-10-CM | POA: Diagnosis not present

## 2024-09-23 DIAGNOSIS — Z5941 Food insecurity: Secondary | ICD-10-CM

## 2024-09-23 DIAGNOSIS — Z5948 Other specified lack of adequate food: Secondary | ICD-10-CM

## 2024-09-23 DIAGNOSIS — Z833 Family history of diabetes mellitus: Secondary | ICD-10-CM | POA: Diagnosis not present

## 2024-09-23 DIAGNOSIS — I10 Essential (primary) hypertension: Secondary | ICD-10-CM | POA: Diagnosis present

## 2024-09-23 DIAGNOSIS — D649 Anemia, unspecified: Secondary | ICD-10-CM | POA: Diagnosis present

## 2024-09-23 DIAGNOSIS — I110 Hypertensive heart disease with heart failure: Secondary | ICD-10-CM | POA: Diagnosis present

## 2024-09-23 DIAGNOSIS — Z888 Allergy status to other drugs, medicaments and biological substances status: Secondary | ICD-10-CM | POA: Diagnosis not present

## 2024-09-23 DIAGNOSIS — Z886 Allergy status to analgesic agent status: Secondary | ICD-10-CM | POA: Diagnosis not present

## 2024-09-23 DIAGNOSIS — Z882 Allergy status to sulfonamides status: Secondary | ICD-10-CM

## 2024-09-23 DIAGNOSIS — Z881 Allergy status to other antibiotic agents status: Secondary | ICD-10-CM | POA: Diagnosis not present

## 2024-09-23 DIAGNOSIS — L02214 Cutaneous abscess of groin: Secondary | ICD-10-CM | POA: Diagnosis not present

## 2024-09-23 DIAGNOSIS — E119 Type 2 diabetes mellitus without complications: Secondary | ICD-10-CM | POA: Diagnosis present

## 2024-09-23 DIAGNOSIS — L03315 Cellulitis of perineum: Secondary | ICD-10-CM | POA: Diagnosis present

## 2024-09-23 DIAGNOSIS — Z8249 Family history of ischemic heart disease and other diseases of the circulatory system: Secondary | ICD-10-CM | POA: Diagnosis not present

## 2024-09-23 DIAGNOSIS — R462 Strange and inexplicable behavior: Secondary | ICD-10-CM | POA: Diagnosis present

## 2024-09-23 DIAGNOSIS — F17210 Nicotine dependence, cigarettes, uncomplicated: Secondary | ICD-10-CM | POA: Diagnosis present

## 2024-09-23 DIAGNOSIS — Z91199 Patient's noncompliance with other medical treatment and regimen due to unspecified reason: Secondary | ICD-10-CM | POA: Diagnosis not present

## 2024-09-23 DIAGNOSIS — F209 Schizophrenia, unspecified: Secondary | ICD-10-CM | POA: Diagnosis present

## 2024-09-23 DIAGNOSIS — Z91048 Other nonmedicinal substance allergy status: Secondary | ICD-10-CM | POA: Diagnosis not present

## 2024-09-23 LAB — LACTIC ACID, PLASMA: Lactic Acid, Venous: 0.8 mmol/L (ref 0.5–1.9)

## 2024-09-23 LAB — HEMOGLOBIN A1C
Hgb A1c MFr Bld: 5.5 % (ref 4.8–5.6)
Mean Plasma Glucose: 111.15 mg/dL

## 2024-09-23 LAB — GLUCOSE, CAPILLARY
Glucose-Capillary: 67 mg/dL — ABNORMAL LOW (ref 70–99)
Glucose-Capillary: 84 mg/dL (ref 70–99)
Glucose-Capillary: 90 mg/dL (ref 70–99)
Glucose-Capillary: 110 mg/dL — ABNORMAL HIGH (ref 70–99)
Glucose-Capillary: 89 mg/dL (ref 70–99)
Glucose-Capillary: 112 mg/dL — ABNORMAL HIGH (ref 70–99)

## 2024-09-23 LAB — HIV ANTIBODY (ROUTINE TESTING W REFLEX): HIV Screen 4th Generation wRfx: NONREACTIVE

## 2024-09-23 MED ORDER — DOXYCYCLINE HYCLATE 100 MG PO TABS: 100.0000 mg | ORAL_TABLET | Freq: Every day | ORAL | Status: DC

## 2024-09-23 MED ORDER — HALOPERIDOL 5 MG PO TABS
5.0000 mg | ORAL_TABLET | Freq: Three times a day (TID) | ORAL | Status: DC | PRN
Start: 2024-09-23 — End: 2024-09-23

## 2024-09-23 MED ORDER — INSULIN ASPART 100 UNIT/ML IJ SOLN: 0.0000 [IU] | INTRAMUSCULAR | Status: DC

## 2024-09-23 MED ORDER — AMLODIPINE BESYLATE 5 MG PO TABS
5.0000 mg | ORAL_TABLET | Freq: Every day | ORAL | Status: DC
  Administered 2024-09-23: 5 mg via ORAL
  Filled 2024-09-23: qty 1

## 2024-09-23 MED ORDER — HYDROCODONE-ACETAMINOPHEN 5-325 MG PO TABS: 1.0000 | ORAL_TABLET | ORAL | Status: DC | PRN

## 2024-09-23 MED ORDER — INSULIN ASPART 100 UNIT/ML IJ SOLN
0.0000 [IU] | INTRAMUSCULAR | Status: DC
  Filled 2024-09-23: qty 1

## 2024-09-23 MED ORDER — HALOPERIDOL LACTATE 5 MG/ML IJ SOLN: 5.0000 mg | Freq: Three times a day (TID) | INTRAMUSCULAR | Status: DC | PRN

## 2024-09-23 MED ORDER — IRBESARTAN 75 MG PO TABS
75.0000 mg | ORAL_TABLET | Freq: Every day | ORAL | Status: DC
  Administered 2024-09-24 – 2024-09-29 (×5): 75 mg via ORAL
  Filled 2024-09-23 (×6): qty 1

## 2024-09-23 MED ORDER — DIPHENHYDRAMINE HCL 50 MG/ML IJ SOLN
50.0000 mg | Freq: Three times a day (TID) | INTRAMUSCULAR | Status: DC | PRN
Start: 2024-09-23 — End: 2024-09-29

## 2024-09-23 MED ORDER — ONDANSETRON HCL 4 MG/2ML IJ SOLN
4.0000 mg | Freq: Four times a day (QID) | INTRAMUSCULAR | Status: DC | PRN
Start: 2024-09-23 — End: 2024-09-23

## 2024-09-23 MED ORDER — IRBESARTAN 75 MG PO TABS
75.0000 mg | ORAL_TABLET | Freq: Every day | ORAL | Status: DC
  Administered 2024-09-23: 75 mg via ORAL
  Filled 2024-09-23: qty 1

## 2024-09-23 MED ORDER — ALBUTEROL SULFATE (2.5 MG/3ML) 0.083% IN NEBU: 2.5000 mg | INHALATION_SOLUTION | Freq: Four times a day (QID) | RESPIRATORY_TRACT | Status: DC | PRN

## 2024-09-23 MED ORDER — DIPHENHYDRAMINE HCL 50 MG/ML IJ SOLN: 50.0000 mg | Freq: Three times a day (TID) | INTRAMUSCULAR | Status: DC | PRN

## 2024-09-23 MED ORDER — AMLODIPINE BESYLATE 5 MG PO TABS
5.0000 mg | ORAL_TABLET | Freq: Every day | ORAL | Status: DC
  Administered 2024-09-24 – 2024-09-26 (×3): 5 mg via ORAL
  Filled 2024-09-23 (×3): qty 1

## 2024-09-23 MED ORDER — LORAZEPAM 2 MG/ML IJ SOLN: 2.0000 mg | Freq: Three times a day (TID) | INTRAMUSCULAR | Status: DC | PRN

## 2024-09-23 MED ORDER — ONDANSETRON HCL 4 MG PO TABS: 4.0000 mg | ORAL_TABLET | Freq: Four times a day (QID) | ORAL | Status: DC | PRN

## 2024-09-23 MED ORDER — LORAZEPAM 2 MG/ML IJ SOLN
2.0000 mg | Freq: Three times a day (TID) | INTRAMUSCULAR | Status: DC | PRN
Start: 2024-09-23 — End: 2024-09-29

## 2024-09-23 MED ORDER — POTASSIUM CHLORIDE CRYS ER 20 MEQ PO TBCR
40.0000 meq | EXTENDED_RELEASE_TABLET | Freq: Once | ORAL | Status: AC
  Administered 2024-09-23: 40 meq via ORAL
  Filled 2024-09-23: qty 2

## 2024-09-23 MED ORDER — VANCOMYCIN HCL 1750 MG/350ML IV SOLN
1750.0000 mg | Freq: Once | INTRAVENOUS | Status: AC
  Administered 2024-09-23: 1750 mg via INTRAVENOUS
  Filled 2024-09-23: qty 350

## 2024-09-23 MED ORDER — DIPHENHYDRAMINE HCL 25 MG PO CAPS: 50.0000 mg | ORAL_CAPSULE | Freq: Three times a day (TID) | ORAL | Status: DC | PRN

## 2024-09-23 MED ORDER — HYDROCODONE-ACETAMINOPHEN 5-325 MG PO TABS
1.0000 | ORAL_TABLET | ORAL | Status: DC | PRN
  Administered 2024-09-23: 1 via ORAL
  Filled 2024-09-23: qty 1

## 2024-09-23 MED ORDER — ALUM & MAG HYDROXIDE-SIMETH 200-200-20 MG/5ML PO SUSP: 30.0000 mL | ORAL | Status: DC | PRN

## 2024-09-23 MED ORDER — HYDROXYZINE HCL 25 MG PO TABS: 25.0000 mg | ORAL_TABLET | Freq: Three times a day (TID) | ORAL | Status: DC | PRN

## 2024-09-23 MED ORDER — MORPHINE SULFATE (PF) 2 MG/ML IV SOLN: 2.0000 mg | INTRAVENOUS | Status: DC | PRN

## 2024-09-23 MED ORDER — DEXTROSE 50 % IV SOLN
12.5000 g | INTRAVENOUS | Status: AC
  Administered 2024-09-23: 12.5 g via INTRAVENOUS
  Filled 2024-09-23: qty 50

## 2024-09-23 MED ORDER — DOXYCYCLINE HYCLATE 100 MG PO TABS: 100.0000 mg | ORAL_TABLET | Freq: Two times a day (BID) | ORAL | Status: DC

## 2024-09-23 MED ORDER — HALOPERIDOL 5 MG PO TABS: 5.0000 mg | ORAL_TABLET | Freq: Three times a day (TID) | ORAL | Status: DC | PRN

## 2024-09-23 MED ORDER — VANCOMYCIN HCL IN DEXTROSE 1-5 GM/200ML-% IV SOLN
1000.0000 mg | Freq: Two times a day (BID) | INTRAVENOUS | Status: DC
  Administered 2024-09-23: 1000 mg via INTRAVENOUS
  Filled 2024-09-23: qty 200

## 2024-09-23 MED ORDER — ACETAMINOPHEN 650 MG RE SUPP
650.0000 mg | Freq: Four times a day (QID) | RECTAL | Status: DC | PRN
Start: 2024-09-23 — End: 2024-09-23

## 2024-09-23 MED ORDER — HALOPERIDOL LACTATE 5 MG/ML IJ SOLN
5.0000 mg | Freq: Three times a day (TID) | INTRAMUSCULAR | Status: DC | PRN
Start: 2024-09-23 — End: 2024-09-23
  Administered 2024-09-23: 5 mg via INTRAMUSCULAR
  Filled 2024-09-23: qty 1

## 2024-09-23 MED ORDER — TRAZODONE HCL 50 MG PO TABS
50.0000 mg | ORAL_TABLET | Freq: Every evening | ORAL | Status: DC | PRN
  Administered 2024-09-23: 50 mg via ORAL
  Filled 2024-09-23: qty 1

## 2024-09-23 MED ORDER — MAGNESIUM HYDROXIDE 400 MG/5ML PO SUSP: 30.0000 mL | Freq: Every day | ORAL | Status: DC | PRN

## 2024-09-23 MED ORDER — KETOROLAC TROMETHAMINE 30 MG/ML IJ SOLN: 30.0000 mg | Freq: Four times a day (QID) | INTRAMUSCULAR | Status: DC | PRN

## 2024-09-23 MED ORDER — ACETAMINOPHEN 325 MG PO TABS: 650.0000 mg | ORAL_TABLET | Freq: Four times a day (QID) | ORAL | Status: DC | PRN

## 2024-09-23 MED ORDER — SODIUM CHLORIDE 0.9 % IV SOLN: 1.0000 g | INTRAVENOUS | Status: DC

## 2024-09-23 MED ORDER — DOXYCYCLINE HYCLATE 100 MG PO TABS
100.0000 mg | ORAL_TABLET | Freq: Two times a day (BID) | ORAL | Status: DC
  Administered 2024-09-23 – 2024-09-29 (×11): 100 mg via ORAL
  Filled 2024-09-23 (×11): qty 1

## 2024-09-23 MED ORDER — HALOPERIDOL LACTATE 5 MG/ML IJ SOLN: 10.0000 mg | Freq: Three times a day (TID) | INTRAMUSCULAR | Status: DC | PRN

## 2024-09-23 MED ORDER — ACETAMINOPHEN 650 MG RE SUPP: 650.0000 mg | Freq: Four times a day (QID) | RECTAL | Status: DC | PRN

## 2024-09-23 NOTE — Tx Team (Signed)
 Initial Treatment Plan 09/23/2024 6:23 PM Walter Pena. FMW:[id number]    PATIENT STRESSORS: Health problems     PATIENT STRENGTHS: Ability for insight    PATIENT IDENTIFIED PROBLEMS:   Depression                    DISCHARGE CRITERIA:  Ability to meet basic life and health needs  PRELIMINARY DISCHARGE PLAN: Return to previous living arrangement  PATIENT/FAMILY INVOLVEMENT: This treatment plan has been presented to and reviewed with the patient, Walter Lewis Thurmond Jr., The patient and family have been given the opportunity to ask questions and make suggestions.  Walter KATHEE Flemings, RN 09/23/2024, 6:23 PM

## 2024-09-23 NOTE — Assessment & Plan Note (Signed)
 Sliding scale insulin  coverage

## 2024-09-23 NOTE — Plan of Care (Signed)
   Problem: Education: Goal: Emotional status will improve Outcome: Progressing

## 2024-09-23 NOTE — ED Notes (Signed)
 Rocephin  started at 2355 and blood cultures were collected before starting antibiotics around 2350. Disregard charting on blood culture collection at 2356.

## 2024-09-23 NOTE — Discharge Instructions (Addendum)
 Call and schedule an appointment with [facility]  General Surgery Ryan Vivica Hope, MD  [phone]  Dermatology  Elveria Newell Saa, [place]  [phone]  Food Resources  Agency Name: [facility] Agency Address: [address] [place], [place] [address] Phone: [phone] Website: [email or web address] Service(s) Offered: Housing services, self-sufficiency, congregate meal program, weatherization program, event organiser program, emergency food assistance,  housing counseling, home ownership program, wheels - to work program.  [employer] free for 60 and older at various locations from usaa, Monday-Friday:  [employer], [address] [place], [phone] -[facility], [address], [place] [phone]  -[facility], [address], [place] [phone]  -[address], [address], [place], [phone]  Agency Name: [facility] Address: [phone] [address], [place], [place] [address] Phone: [phone] Website: [email or web address] Service(s) Offered: Home delivered hot, frozen, and emergency  meals. Grocery assistance program which matches  volunteers one-on-one with seniors unable to grocery shop  for themselves. Must be 60 years and older; less than 20  hours of in-home aide service, limited or no driving ability;  live alone or with someone with a disability; live in  [place].  Agency Name: [facility] [employer]) Address: [address] [place], [place] [address] Phone: [phone] Service(s) Offered: Food is served to shut-ins, homeless, elderly, and low income people in the community every Saturday (11:30 am-12:30 pm) and Sunday (12:30 pm-1:30pm). Volunteers also offer help and encouragement in seeking employment,  and spiritual guidance.  Agency Name: [employer] Address: [address] [place], [place] [address] Phone: [phone] Service(s) Offered: Child support services; child welfare services; food stamps; Medicaid; work first family assistance; and aid with fuel,  rent, food and medicine.  Agency Name: [employer] Address: [address] [place], [place] Phone: [phone] Website: [email or web address] Services Offered: Monday 10:00am-12:00, 8:00pm-9:00pm, and Friday 10:00am-12:00.  Agency Name: [employer] [facility] Address: [address], [place], [place] [address] Phone: [phone] Website: [email or web address] Service(s) Offered: Serves weekday meals, open from 11:30 am- 1:00 pm., and 6:30-7:30pm, Monday-Wednesday-Friday distributes food 3:30-6pm, Monday-Wednesday-Friday.  Agency Name: [facility] Address: [address] [place], [place] Phone: [phone] Website: [email or web address] Services Offered: Distributes food the 4th Saturday of the month, starting at 8:00 am  Agency Name: [facility] Address: [phone] [address], [place], [place] [address] Phone: [phone] Website: [email or web address] Service(s) Offered: Bread of life, weekly food pantry. Open Wednesdays from 10:00am-noon.  Agency Name: [facility] [employer] Address: [address] [place], [place], [place] Phone: [phone] Services Offered: Distributes food 9am-1pm, Monday-Thursday. Call for details.  Agency Name: [facility] Address: [address] [place], [place] [address] Phone: [phone] Website: [email or web address] Service(s) Offered: Games Developer. Call for assistance.  Agency Name: [facility] Address: [address], [place], [place] [address] Phone: [phone] Service Offered: Emergency Food Pantry. Call for appointment.  Agency Name: [facility] Address: [address], [place], [place]  [address] Phone: [phone] Website: [email or web address] Services Offered: Games Developer. Call for details  Agency Name: [facility] Address: [address] [place], [place] Phone: [phone] Website: [email or web address] Service(s) Offered: Emergency Food Pantry. Call for details.  Agency Name: [employer] Address: [address] [place], [place] [address] Phone: [phone] or [phone] Website: [email or web address] Service(s) Offered: Distribute food 9am-11:30 am, Tuesday-Friday, and 1-3:30pm, Monday-Friday. Food pantry Monday-Friday 1pm-3pm, fresh items,  Mon.-Wed.-Fri.  Agency Name: [facility] Address: [address] [place], [place] [address] Phone: [phone] Website: [email or web address] Services Offered: Distribute food Tues and Sats from 9:00am-noon. Closed 1st Saturday of each month. Call for details  Agency Name: [employer] Address: Fayrene Boatman [facility] [address] [place] [address] Phone: [phone]  Services Offered: Delivers meals every Thursday

## 2024-09-23 NOTE — Plan of Care (Signed)
  Problem: Education: Goal: Ability to describe self-care measures that may prevent or decrease complications (Diabetes Survival Skills Education) will improve Outcome: Adequate for Discharge Goal: Individualized Educational Video(s) Outcome: Adequate for Discharge   Problem: Coping: Goal: Ability to adjust to condition or change in health will improve Outcome: Adequate for Discharge   Problem: Fluid Volume: Goal: Ability to maintain a balanced intake and output will improve Outcome: Adequate for Discharge   Problem: Health Behavior/Discharge Planning: Goal: Ability to identify and utilize available resources and services will improve Outcome: Adequate for Discharge Goal: Ability to manage health-related needs will improve Outcome: Adequate for Discharge   Problem: Metabolic: Goal: Ability to maintain appropriate glucose levels will improve Outcome: Adequate for Discharge   Problem: Nutritional: Goal: Maintenance of adequate nutrition will improve Outcome: Adequate for Discharge Goal: Progress toward achieving an optimal weight will improve Outcome: Adequate for Discharge   Problem: Skin Integrity: Goal: Risk for impaired skin integrity will decrease Outcome: Adequate for Discharge   Problem: Tissue Perfusion: Goal: Adequacy of tissue perfusion will improve Outcome: Adequate for Discharge   Problem: Education: Goal: Knowledge of General Education information will improve Description: Including pain rating scale, medication(s)/side effects and non-pharmacologic comfort measures Outcome: Adequate for Discharge   Problem: Clinical Measurements: Goal: Ability to maintain clinical measurements within normal limits will improve Outcome: Adequate for Discharge Goal: Will remain free from infection Outcome: Adequate for Discharge Goal: Diagnostic test results will improve Outcome: Adequate for Discharge Goal: Respiratory complications will improve Outcome: Adequate for  Discharge Goal: Cardiovascular complication will be avoided Outcome: Adequate for Discharge   Problem: Health Behavior/Discharge Planning: Goal: Ability to manage health-related needs will improve Outcome: Adequate for Discharge   Problem: Elimination: Goal: Will not experience complications related to bowel motility Outcome: Adequate for Discharge Goal: Will not experience complications related to urinary retention Outcome: Adequate for Discharge   Problem: Coping: Goal: Level of anxiety will decrease Outcome: Adequate for Discharge   Problem: Nutrition: Goal: Adequate nutrition will be maintained Outcome: Adequate for Discharge

## 2024-09-23 NOTE — Consult Note (Signed)
 [facility] Psychiatric Consult Initial  Patient Name: .Walter Pena.  MRN: [id number]  DOB: [birth year]  Consult Order details:  Orders (From admission, onward)     Start     Ordered   09/23/24 0804  IP CONSULT TO PSYCHIATRY       Ordering Provider: Kandis Devaughn Sayres, MD  Provider:  (Not yet assigned)  Question Answer Comment  Location [facility]   Reason for Consult? IVC for erratic behavior      09/23/24 0803             Mode of Visit: In person    Psychiatry Consult Evaluation  Service Date: September 23, 2024 LOS:  LOS: 0 days  Chief Complaint Locked up for a long time  Primary Psychiatric Diagnoses  Schizophrenia   Assessment  Walter Pena. is a 43 y.o. male admitted: Medically   Per initial triage notes Patient brought in via [facility] [employer] tonight under IVC. Paperwork states patient recently released from prison and has hx of schizophrenia, not taking any meds with escalations in behavior/ acting erratic. Patient states he does not need meds in triage, denies wanting to harm anyone or himself. He does endorse some wounds from previous boil surgery a year ago that is not healing. - Patient was medically admitted.  On assessment today, patient seen by psychiatry. Per chart review, patient has been diagnosed with schizophrenia and supposed to be receiving Invega  injections. However, on report patient stated nah that aint right, I aint ever had no schizophrenia. Patient also reported not being on medications for 20 years. Patient was recently released from prison and had been staying with his mother. 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 Patient denied suicidal or homicidal ideations. Patient denied current auditory or visual hallucinations. Patient did display somewhat disorganized thought process and would occasionally respond with  irrelevant answers to assessment questions. For example, when asked about current medications, that is when he informed this provider about attending [employer]. Patient did give permission for this provider to contact his mother, Walter Pena. Per mother, she has safety concerns about patient being released back home and does not feel safe at this time with patient returning and taking taking his injections. She reported patient was switched to injections due to noncompliance with several different oral medications. She reported increasing delusional behaviors at home and increasing verbal aggression towards her when she attempts to tell him no. 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 With safety concerns presented from mother, medication non compliance, we will recommend inpatient psychiatric admission after medical clearance. Patient is currently IVC'd.  Diagnoses:  Active Hospital problems: Principal Problem:   Cellulitis and abscess perineum Active Problems:   Schizophrenia, paranoid, chronic (HCC)   HTN (hypertension)   Alcohol use disorder, moderate, dependence (HCC)   Diabetes mellitus without complication (HCC)   Hidradenitis suppurativa of anogenital region, severe with cellulitis and abscesses   Hypokalemia   Involuntary commitment   COPD (chronic obstructive pulmonary disease) (HCC)    Plan   ## Psychiatric Medication Recommendations:  Defer to inpatient unit- patient refused to talk about medications with this provider  ## Medical Decision Making Capacity: Not specifically addressed in this encounter  ## Further Work-up:   -- EKG ordered -- Pertinent labwork reviewed earlier this admission  includes: acetaminophen  levels, cbc, ethanol, urine drug screen, cmp   ## Disposition:-- We recommend inpatient psychiatric hospitalization after  medical hospitalization. Patient has been involuntarily committed on 09/22/2024.   ## Behavioral / Environmental: - No specific recommendations at this time.     ## Safety and Observation Level:  - Based on my clinical evaluation, I estimate the patient to be at low risk of self harm in the current setting. - At this time, we recommend  routine. This decision is based on my review of the chart including patient's history and current presentation, interview of the patient, mental status examination, and consideration of suicide risk including evaluating suicidal ideation, plan, intent, suicidal or self-harm behaviors, risk factors, and protective factors. This judgment is based on our ability to directly address suicide risk, implement suicide prevention strategies, and develop a safety plan while the patient is in the clinical setting. Please contact our team if there is a concern that risk level has changed.  CSSR Risk Category:C-SSRS RISK CATEGORY: No Risk  Suicide Risk Assessment: Patient has following modifiable risk factors for suicide: medication noncompliance, lack of access to outpatient mental health resources, and active mental illness (to encompass adhd, tbi, mania, psychosis, trauma reaction), which we are addressing by recommending inpatient admission for further monitoring and stabilization. Patient has following non-modifiable or demographic risk factors for suicide: male gender and psychiatric hospitalization Patient has the following protective factors against suicide: Supportive family  Thank you for this consult request. Recommendations have been communicated to the primary team.  We will sign off at this time.   Zelda Sharps, NP        History of Present Illness  Relevant Aspects of [facility]   Patient Report:  On assessment today, patient seen by psychiatry. Per chart review, patient has been diagnosed with schizophrenia and supposed to be receiving Invega   injections. However, on report patient stated nah that aint right, I aint ever had no schizophrenia. Patient also reported not being on medications for 20 years. Patient was recently released from prison and had been staying with his mother. 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 Patient denied suicidal or homicidal ideations. Patient denied current auditory or visual hallucinations. Patient did display somewhat disorganized thought process and would occasionally respond with irrelevant answers to assessment questions. For example, when asked about current medications, that is when he informed this provider about attending [employer]. Patient did give permission for this provider to contact his mother, Walter Pena. Per mother, she has safety concerns about patient being released back home and does not feel safe at this time with patient returning and taking taking his injections. She reported patient was switched to injections due to noncompliance with several different oral medications. She reported increasing delusional behaviors at home and increasing verbal aggression towards her when she attempts to tell him no. 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 With safety concerns presented from mother, medication non compliance, we will recommend inpatient psychiatric admission after medical clearance. Patient is currently IVC'd.  Psych ROS:  Depression: Denied Anxiety:  Denied Mania (lifetime and current): Denied Psychosis: (lifetime and current): yes- see chart review- previous inpatient admissions noted  Collateral information:  Contacted Walter Pena at number listed in chart on 09/23/2024    Psychiatric and Social History  Psychiatric History:  Information collected  from patient/chart  review/patient mother  Prev Dx/Sx: Schizophrenia Current Psych Provider: none Home Meds (current): none Previous Med Trials: invega  Therapy: none  Prior Psych Hospitalization: yes  Prior Self Harm: denied Prior Violence: yes  Family Psych History: denied Family Hx suicide: denied  Social History:   Educational Hx: unknown Occupational Hx: mother reported patient not currently working 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 [employer] Living Situation: with mother Spiritual Hx: unknown Access to weapons/lethal means: Denied   Substance History Alcohol: Denied  Tobacco: Smokes cigarettes daily Illicit drugs: denied Prescription drug abuse: denied Rehab hx: denied  Exam Findings  Physical Exam: deferred to medical MD- note reviewed Vital Signs:  Temp:  [97.4 F (36.3 C)-99.2 F (37.3 C)] 98.9 F (37.2 C) (11/07 0857) Pulse Rate:  [66-90] 72 (11/07 0857) Resp:  [16-18] 18 (11/07 0857) BP: (126-155)/(92-101) 155/92 (11/07 0857) SpO2:  [98 %-100 %] 98 % (11/07 0857) Weight:  [74.8 kg] 74.8 kg (11/06 2125) Blood pressure (!) 155/92, pulse 72, temperature 98.9 F (37.2 C), temperature source Oral, resp. rate 18, height 5' 8 (1.727 m), weight 74.8 kg, SpO2 98%. Body mass index is 25.09 kg/m.    Mental Status Exam: General Appearance: Fairly Groomed  Orientation:  Full (Time, Place, and Person)  Memory:  Immediate;   Fair Recent;   Fair Remote;   Fair  Concentration:  Concentration: Fair  Recall:  Poor  Attention  Fair  Eye Contact:  Fair  Speech:  Normal Rate  Language:  Fair  Volume:  Normal  Mood: fine  Affect:  Blunt  Thought Process:  Disorganized and Irrelevant  Thought Content:  Delusions  Suicidal Thoughts:  No  Homicidal Thoughts:  No  Judgement:  Impaired  Insight:  Lacking  Psychomotor Activity:  Normal  Akathisia:  No  Fund of Knowledge:  Fair      Assets:  Housing Social Support  Cognition:  WNL  ADL's:  Intact  AIMS (if indicated):         Other History   These have been pulled in through the EMR, reviewed, and updated if appropriate.  Family History:  The patient's family history includes Diabetes Mellitus II in his mother; Hypertension in his father and mother.  Medical History: Past Medical History:  Diagnosis Date   CHF (congestive heart failure) (HCC)    COPD (chronic obstructive pulmonary disease) (HCC)    Gout    Hidradenitis    Hypertension    Schizophrenia (HCC)     Surgical History: Past Surgical History:  Procedure Laterality Date   sweat gland removal       Medications:   Current Facility-Administered Medications:    0.9 %  sodium chloride  infusion, , Intravenous, Continuous, Ward, Kristen N, DO, Last Rate: 125 mL/hr at 09/23/24 0817, New Bag at 09/23/24 0817   acetaminophen  (TYLENOL ) tablet 650 mg, 650 mg, Oral, Q6H PRN **OR** acetaminophen  (TYLENOL ) suppository 650 mg, 650 mg, Rectal, Q6H PRN, Cleatus Delayne GAILS, MD   albuterol  (PROVENTIL ) (2.5 MG/3ML) 0.083% nebulizer solution 2.5 mg, 2.5 mg, Inhalation, Q6H PRN, Cleatus Delayne GAILS, MD   amLODipine  (NORVASC ) tablet 5 mg, 5 mg, Oral, Daily, Cleatus Delayne V, MD, 5 mg at 09/23/24 0919   [START ON 09/24/2024] cefTRIAXone  (ROCEPHIN ) 1 g in sodium chloride  0.9 % 100 mL IVPB, 1 g, Intravenous, Q24H, Cleatus, Hazel V, MD   HYDROcodone -acetaminophen  (NORCO/VICODIN) 5-325 MG per tablet 1-2 tablet, 1-2 tablet, Oral, Q4H PRN, Cleatus Delayne GAILS, MD   insulin  aspart (novoLOG ) injection  0-15 Units, 0-15 Units, Subcutaneous, Q4H, Cleatus Delayne GAILS, MD   irbesartan  (AVAPRO ) tablet 75 mg, 75 mg, Oral, Daily, Cleatus Delayne GAILS, MD, 75 mg at 09/23/24 0919   ketorolac  (TORADOL ) 30 MG/ML injection 30 mg, 30 mg, Intravenous, Q6H PRN, Cleatus Delayne GAILS, MD   morphine (PF) 2 MG/ML injection 2 mg, 2 mg, Intravenous, Q2H PRN, Cleatus Delayne GAILS, MD   ondansetron  (ZOFRAN ) tablet 4 mg, 4 mg, Oral, Q6H PRN **OR** ondansetron  (ZOFRAN ) injection 4 mg, 4 mg, Intravenous, Q6H PRN, Cleatus Delayne GAILS,  MD   vancomycin  (VANCOCIN ) IVPB 1000 mg/200 mL premix, 1,000 mg, Intravenous, Q12H, Cleatus Delayne GAILS, MD  Allergies: Allergies  Allergen Reactions   Allopurinol      Other Reaction(s): Stevens-Johnson syndrome   Meloxicam     Other Reaction(s): Stevens-Johnson syndrome   Bactrim [Sulfamethoxazole-Trimethoprim] Nausea And Vomiting   Clindamycin  Other (See Comments)    Unknown   Lisinopril Swelling   Other Rash    aloe    Zelda Sharps, NP This note was created using Scientist, clinical (histocompatibility and immunogenetics). Please excuse any inadvertent transcription errors. Case was discussed with supervising physician Dr. Jadapalle who is agreeable with current plan.

## 2024-09-23 NOTE — Assessment & Plan Note (Addendum)
 Chronic paranoid schizophrenia Patient was brought in by police department due to erratic behavior Will continue IVC until evaluated by behavioral health To consult behavioral health in the a.m. Has been on Invega  Awaiting med rec to resume meds if any Will have as needed antipsychotics if needed while awaiting behavioral health

## 2024-09-23 NOTE — TOC CM/SW Note (Signed)
 Transition of Care [facility]) - Inpatient Brief Assessment   Patient Details  Name: Walter Pena. MRN: [id number] Date of Birth: 07/29/1981  Transition of Care [facility]) CM/SW Contact:    Corean ONEIDA Haddock, RN Phone Number: 09/23/2024, 9:34 AM   Clinical Narrative:   Transition of Care [facility]) Screening Note   Patient Details  Name: [name]. Date of Birth: 11-May-1981   Transition of Care [facility]) CM/SW Contact:    Corean ONEIDA Haddock, RN Phone Number: 09/23/2024, 9:35 AM    Transition of Care Department [facility]) has reviewed patient and no TOC needs have been identified at this time.  If new patient transition needs arise, please place a TOC consult.  Per SDOH food resources added to AVS Patient currently under IVC, surgery does not recommend any surgical intervention and recommends re-start wound care with [facility] General Surgery    Transition of Care Asessment: Insurance and Status: Insurance coverage has been reviewed Patient has primary care physician: Yes     Prior/Current Home Services: No current home services Social Drivers of Health Review: SDOH reviewed interventions complete Readmission risk has been reviewed: Yes Transition of care needs: no transition of care needs at this time

## 2024-09-23 NOTE — Progress Notes (Signed)
 CODE SEPSIS - PHARMACY COMMUNICATION  **Broad Spectrum Antibiotics should be administered within 1 hour of Sepsis diagnosis**  Time Code Sepsis Called/Page Received:  11/7 @ 0034   Antibiotics Ordered: Vanc, ceftriaxone    Time of 1st antibiotic administration: Ceftriaxone  2 gm IV x 1 on 11/6 @ 2355   Additional action taken by pharmacy:   If necessary, Name of Provider/Nurse Contacted:     Leanne Sisler D ,PharmD Clinical Pharmacist  09/23/2024  12:58 AM

## 2024-09-23 NOTE — Sepsis Progress Note (Signed)
 Note to RN to confirm times for [facility] collection and first Abx dose.

## 2024-09-23 NOTE — Assessment & Plan Note (Addendum)
 Severe hidradenitis of the perineal area Sherin stage III disease with abscesses/nodules/scarring) Follows at [facility]- dermatology/plastics/wound care (interrupted care for several years) Previously used topical clindamycin ,  Infliximab was ordered in 2024 by dermatology Continue Rocephin  and vancomycin  (patient allergic to clindamycin ) Wound care consult for topical care Pain control Surgery on board and will take patient to the OR N.p.o. for procedure in the a.m.

## 2024-09-23 NOTE — Consult Note (Addendum)
 [facility]-General Surgery  SURGICAL CONSULTATION NOTE    HISTORY OF PRESENT ILLNESS (HPI):  43 y.o. male presented to [facility] ED yesterday under IVC for erratic behavior, also reporting skin wounds. Patient reports having a history of chronic hidradenitis of the perineal area associated with abscesses and scarring. Admits having surgery on one of the current area of concern located on perineum in December 2024 at [facility] in [place], [place]. States the boil has reoccurred. He has noticed some bloody drainage but denies any pain. He was previously on topical clindamycin . He was seen by a dermatology in 2024 and was started on Infliximab. He was followed by [facility] general surgery for wound care from 09/2023-06/2024, however, care was interrupted because patient was incarcerated.  In the ED, patient was afebrile with a BP of 126/98 and HR of 90.  Labs indicated leukocytosis 14.2 and noted some electrolyte disturbance.  Lactic acid was normal at 0.8.  CT of pelvis showed cellulitis with developing abscesses in the perineum along gluteal cleft and posterior midline subcutaneous gluteal fat.  Surgery is consulted by Dr. Neomi in this context for evaluation and management of possible abscess in the perineum.   PAST MEDICAL HISTORY (PMH):  Past Medical History:  Diagnosis Date   CHF (congestive heart failure) (HCC)    COPD (chronic obstructive pulmonary disease) (HCC)    Gout    Hidradenitis    Hypertension    Schizophrenia (HCC)      PAST SURGICAL HISTORY (PSH):  Past Surgical History:  Procedure Laterality Date   sweat gland removal       MEDICATIONS:  Prior to Admission medications   Medication Sig Start Date End Date Taking? Authorizing Provider  albuterol  (VENTOLIN  HFA) 108 (90 Base) MCG/ACT inhaler Inhale 2 puffs into the lungs every 6 (six) hours as needed for wheezing or shortness of breath. 10/15/19   Delores Raford SAILOR, MD  albuterol  (VENTOLIN  HFA) 108 [phone] Base) MCG/ACT  inhaler Inhale 2 puffs into the lungs every 6 (six) hours as needed for wheezing or shortness of breath. 03/26/20   Floy Roberts, MD  allopurinol  (ZYLOPRIM ) 100 MG tablet Take 100 mg by mouth daily. 01/21/17   [provider]  amLODipine  (NORVASC ) 10 MG tablet Take 1 tablet (10 mg total) by mouth daily. 07/30/19   Patel, Sona, MD  famotidine  (PEPCID ) 20 MG tablet Take 1 tablet (20 mg total) by mouth daily. 04/15/20 04/15/21  Goodman, Graydon, MD  fluconazole  (DIFLUCAN ) 100 MG tablet Take 1 tablet (100 mg total) by mouth daily. 04/23/20   Patel, Sona, MD  INVEGA  SUSTENNA 156 MG/ML SUSY injection  12/23/19   [provider]  levofloxacin  (LEVAQUIN ) 750 MG tablet Take 1 tablet (750 mg total) by mouth daily. 04/23/20   Patel, Sona, MD  metFORMIN  (GLUCOPHAGE ) 1000 MG tablet Take 1 tablet (1,000 mg total) by mouth 2 (two) times daily. 07/30/19   Patel, Sona, MD  olmesartan  (BENICAR ) 20 MG tablet Take 1 tablet (20 mg total) by mouth daily. 07/30/19   Patel, Sona, MD  traMADol (ULTRAM) 50 MG tablet  07/30/19   [provider]     ALLERGIES:  Allergies  Allergen Reactions   Allopurinol      Other Reaction(s): Stevens-Johnson syndrome   Meloxicam     Other Reaction(s): Stevens-Johnson syndrome   Bactrim [Sulfamethoxazole-Trimethoprim] Nausea And Vomiting   Clindamycin  Other (See Comments)    Unknown   Lisinopril Swelling   Other Rash    aloe     SOCIAL  HISTORY:  Social History   Socioeconomic History   Marital status: Single    Spouse name: Not on file   Number of children: Not on file   Years of education: Not on file   Highest education level: Not on file  Occupational History   Not on file  Tobacco Use   Smoking status: Every Day    Current packs/day: 1.00    Average packs/day: 1 pack/day for 25.0 years (25.0 ttl pk-yrs)    Types: Cigarettes   Smokeless tobacco: Never  Substance and Sexual Activity   Alcohol use: Not Currently    Comment: Socially   Drug use:  Not Currently    Types: Marijuana   Sexual activity: Not on file  Other Topics Concern   Not on file  Social History Narrative   Not on file   Social Drivers of Health   Financial Resource Strain: Not on file  Food Insecurity: Food Insecurity Present (09/23/2024)   Hunger Vital Sign    Worried About Running Out of Food in the Last Year: Often true    Ran Out of Food in the Last Year: Often true  Transportation Needs: Patient Unable To Answer (09/23/2024)   PRAPARE - Administrator, Civil Service (Medical): Patient unable to answer    Lack of Transportation (Non-Medical): Patient unable to answer  Physical Activity: Not on file  Stress: Not on file  Social Connections: Not on file  Intimate Partner Violence: Not At Risk (09/23/2024)   Humiliation, Afraid, Rape, and Kick questionnaire    Fear of Current or Ex-Partner: No    Emotionally Abused: No    Physically Abused: No    Sexually Abused: No     FAMILY HISTORY:  Family History  Problem Relation Age of Onset   Diabetes Mellitus II Mother    Hypertension Mother    Hypertension Father       REVIEW OF SYSTEMS:  Review of Systems  Constitutional:  Negative for chills and fever.  Skin:        Has noticed some bloody drainage from area of concern in perineum. Denies any pain.      VITAL SIGNS:  Temp:  [97.4 F (36.3 C)-99.2 F (37.3 C)] 99.2 F (37.3 C) (11/07 0436) Pulse Rate:  [66-90] 66 (11/07 0436) Resp:  [16-18] 16 (11/07 0436) BP: (126-143)/(96-101) 127/96 (11/07 0436) SpO2:  [99 %-100 %] 100 % (11/07 0436) Weight:  [74.8 kg] 74.8 kg (11/06 2125)     Height: 5' 8 (172.7 cm) Weight: 74.8 kg BMI (Calculated): 25.09   INTAKE/OUTPUT:  11/06 0701 - 11/07 0700 In: 935.4 [I.V.:585.4; IV Piggyback:350] Out: -   PHYSICAL EXAM:  Physical Exam Constitutional:      Appearance: Normal appearance.  HENT:     Head: Normocephalic and atraumatic.  Cardiovascular:     Rate and Rhythm: Normal rate and  regular rhythm.  Pulmonary:     Effort: Pulmonary effort is normal.     Breath sounds: Normal breath sounds.  Skin:    Comments: Raised and indurated nodule on left buttock lateral to gluteal cleft and posterior midline. Not tender to palpation. No drainage. Mildly fluctuance.  Hypertrophic scar on near right inguinal region likely from previous boil.   Neurological:     Mental Status: He is alert.      Labs:     Latest Ref Rng & Units 09/22/2024    9:32 PM 04/21/2020    6:27 AM 04/14/2020  6:37 PM  CBC  WBC 4.0 - 10.5 K/uL 14.2  12.0  14.4   Hemoglobin 13.0 - 17.0 g/dL 87.3  88.8  89.6   Hematocrit 39.0 - 52.0 % 38.7  34.7  32.1   Platelets 150 - 400 K/uL 289  386  308       Latest Ref Rng & Units 09/22/2024    9:32 PM 04/21/2020    6:27 AM 04/14/2020    6:37 PM  CMP  Glucose 70 - 99 mg/dL 896  769  807   BUN 6 - 20 mg/dL 20  9  10    Creatinine 0.61 - 1.24 mg/dL 8.83  9.09  9.06   Sodium 135 - 145 mmol/L 136  136  136   Potassium 3.5 - 5.1 mmol/L 3.3  3.2  3.3   Chloride 98 - 111 mmol/L 98  99  103   CO2 22 - 32 mmol/L 28  28  24    Calcium 8.9 - 10.3 mg/dL 8.8  8.6  8.4   Total Protein 6.5 - 8.1 g/dL 89.8   8.2   Total Bilirubin 0.0 - 1.2 mg/dL 0.9   0.8   Alkaline Phos 38 - 126 U/L 68   74   AST 15 - 41 U/L 19   25   ALT 0 - 44 U/L 18   42     Imaging studies:  EXAM: CT PELVIS, WITH IV CONTRAST 09/22/2024 11:12:06 PM   TECHNIQUE: Axial images were acquired through the pelvis with IV contrast. 100mL iohexol (OMNIPAQUE) 300 MG/ML solution was administered intravenously. Reformatted images were reviewed. Automated exposure control, iterative reconstruction, and/or weight based adjustment of the mA/kV was utilized to reduce the radiation dose to as low as reasonably achievable.   COMPARISON: CT 11/08/2018   CLINICAL HISTORY: Perianal abscess or fistula suspected.   FINDINGS:   BONES: No acute fracture or focal osseous lesion.   JOINTS: No dislocation. The  joint spaces are normal.   SOFT TISSUES: Nodular soft tissue thickening along the gluteal cleft extending nferiorly into the perineum and into the posterior gluteal subcutaneous soft tissues. Inflammatory mass / developing abscess in the left perineum measuring 7.4 cm on series 4 image 141. Peripheral enhancing irregular collection within the subcutaneous fat in the midline caudal soft tissues measures 10.3 x 4.6 cm on series 5 image 112. Right inguinal lymphadenopathy measuring up to 14 mm in short axis is likely reactive.   INTRAPELVIC CONTENTS: Limited images of the intrapelvic contents demonstrate no acute abnormality.   IMPRESSION: 1. Cellulitis with developing abscesses in the perineum along the gluteal cleft and posterior midline subcutaneous gluteal fat.   Electronically signed by: Norman Gatlin MD 09/22/2024 11:32 PM EST RP Workstation: [id number]   Assessment/Plan: 43 y.o. male with recurrent boil on left buttock near gluteal cleft, complicated by pertinent comorbidities including chronic history of hidradenitis, DM type 2, hypertension, COPD, schizophrenia, and tobacco user.   - Stable vital signs, no fever and not tachycardiac with mild leukocytosis.    - Area of concern for abscess is overall stable. Physical findings correlate with chronic hidradenitis. No signs of infection-no redness, drainage or TTP on exam. No open wound. No surgical intervention is needed at this time. Recommend to re-start wound care with [facility] and follow with previous dermatologist to manage his chronic hidradenitis.   - General surgery will sign off but will still be available for questions/concerns in regards to patient's care.   Thank you for  the opportunity to participate in this patient's care.   -- Gilmer Cea PA-C

## 2024-09-23 NOTE — Hospital Course (Signed)
 Walter Pena

## 2024-09-23 NOTE — H&P (Signed)
 History and Physical    Patient: Walter Pena. FMW:[id number] DOB: [birth year] DOA: 09/22/2024 DOS: the patient was seen and examined on 09/23/2024 PCP: Albina GORMAN Dine, MD  Patient coming from: Home  Chief Complaint:  Chief Complaint  Patient presents with   IVC    HPI: Walter Pena. is a 43 y.o. male with medical history significant for DM, HTN, COPD, schizophrenia, chronic severe hidradenitis of the perineal area Sherin stage III disease with abscesses nodules and scarring)  with prior surgical interventions, previously seen by [facility] dermatology (2022)with plans to start TNF, being admitted with hidradenitis flare associated with cellulitis and abscesses.  He has followed with [facility] wound care from 10/07/2023 to 07/06/2024 however care was interrupted due to incarceration.  He was actually brought under IVC by police due to erratic behavior.  He denied suicidal or homicidal ideation.  He did complain of worsening of his wounds as well as drainage. In the ED, vitals within normal limits Labs notable for leukocytosis of 14,000, mild anemia of 12.6, mild hypokalemia of 3.3 but otherwise unremarkable and toxicology workup was negative.  CT pelvis with IV contrast showed cellulitis with developing abscesses in the perineum along the gluteal cleft and posterior midline subcutaneous gluteal fat.  Surgeon, Dr. Tye was consulted from the ED and will see patient and likely take to the OR on 09/23/2024 Patient started on Rocephin  and vancomycin   Admission requested IVC remains.  Psych not consulted from the ED as patient was cooperative and did not present a problem during workup.    Past Medical History:  Diagnosis Date   CHF (congestive heart failure) (HCC)    COPD (chronic obstructive pulmonary disease) (HCC)    Gout    Hidradenitis    Hypertension    Schizophrenia (HCC)    Past Surgical History:  Procedure Laterality Date   sweat gland removal     Social  History:  reports that he has been smoking cigarettes. He has a 25 pack-year smoking history. He has never used smokeless tobacco. He reports that he does not currently use alcohol. He reports that he does not currently use drugs after having used the following drugs: Marijuana.  Allergies  Allergen Reactions   Allopurinol      Other Reaction(s): Stevens-Johnson syndrome   Meloxicam     Other Reaction(s): Stevens-Johnson syndrome   Bactrim [Sulfamethoxazole-Trimethoprim] Nausea And Vomiting   Clindamycin  Other (See Comments)    Unknown   Lisinopril Swelling   Other Rash    aloe    Family History  Problem Relation Age of Onset   Diabetes Mellitus II Mother    Hypertension Mother    Hypertension Father     Prior to Admission medications   Medication Sig Start Date End Date Taking? Authorizing Provider  albuterol  (VENTOLIN  HFA) 108 (90 Base) MCG/ACT inhaler Inhale 2 puffs into the lungs every 6 (six) hours as needed for wheezing or shortness of breath. 10/15/19   Delores Raford SAILOR, MD  albuterol  (VENTOLIN  HFA) 108 [phone] Base) MCG/ACT inhaler Inhale 2 puffs into the lungs every 6 (six) hours as needed for wheezing or shortness of breath. 03/26/20   Floy Roberts, MD  allopurinol  (ZYLOPRIM ) 100 MG tablet Take 100 mg by mouth daily. 01/21/17   [provider]  amLODipine  (NORVASC ) 10 MG tablet Take 1 tablet (10 mg total) by mouth daily. 07/30/19   Patel, Sona, MD  famotidine  (PEPCID ) 20 MG tablet Take 1 tablet (20 mg total) by mouth daily.  04/15/20 04/15/21  Goodman, Graydon, MD  fluconazole  (DIFLUCAN ) 100 MG tablet Take 1 tablet (100 mg total) by mouth daily. 04/23/20   Patel, Sona, MD  INVEGA  SUSTENNA 156 MG/ML SUSY injection  12/23/19   [provider]  levofloxacin  (LEVAQUIN ) 750 MG tablet Take 1 tablet (750 mg total) by mouth daily. 04/23/20   Patel, Sona, MD  metFORMIN  (GLUCOPHAGE ) 1000 MG tablet Take 1 tablet (1,000 mg total) by mouth 2 (two) times daily. 07/30/19   Patel,  Sona, MD  olmesartan  (BENICAR ) 20 MG tablet Take 1 tablet (20 mg total) by mouth daily. 07/30/19   Patel, Sona, MD  traMADol DANNY) 50 MG tablet  07/30/19   [provider]    Physical Exam: Vitals:   09/22/24 2125  BP: (!) 126/98  Pulse: 90  Resp: 18  Temp: 98.2 F (36.8 C)  TempSrc: Oral  SpO2: 99%  Weight: 74.8 kg  Height: 5' 8 (1.727 m)   Physical Exam Vitals and nursing note reviewed.  Constitutional:      General: He is not in acute distress. HENT:     Head: Normocephalic and atraumatic.  Cardiovascular:     Rate and Rhythm: Normal rate and regular rhythm.     Heart sounds: Normal heart sounds.  Pulmonary:     Effort: Pulmonary effort is normal.     Breath sounds: Normal breath sounds.  Abdominal:     Palpations: Abdomen is soft.     Tenderness: There is no abdominal tenderness.  Skin:    Comments: Defer to surgery  Neurological:     Mental Status: Mental status is at baseline.     Labs on Admission: I have personally reviewed following labs and imaging studies  CBC: Recent Labs  Lab 09/22/24 2132  WBC 14.2*  HGB 12.6*  HCT 38.7*  MCV 86.8  PLT 289   Basic Metabolic Panel: Recent Labs  Lab 09/22/24 2132  NA 136  K 3.3*  CL 98  CO2 28  GLUCOSE 103*  BUN 20  CREATININE 1.16  CALCIUM 8.8*   GFR: Estimated Creatinine Clearance: 78.6 mL/min (by C-G formula based on SCr of 1.16 mg/dL). Liver Function Tests: Recent Labs  Lab 09/22/24 2132  AST 19  ALT 18  ALKPHOS 68  BILITOT 0.9  PROT 10.1*  ALBUMIN 3.4*   No results for input(s): LIPASE, AMYLASE in the last 168 hours. No results for input(s): AMMONIA in the last 168 hours. Coagulation Profile: No results for input(s): INR, PROTIME in the last 168 hours. Cardiac Enzymes: No results for input(s): CKTOTAL, CKMB, CKMBINDEX, TROPONINI in the last 168 hours. BNP (last 3 results) No results for input(s): PROBNP in the last 8760 hours. HbA1C: No results for  input(s): HGBA1C in the last 72 hours. CBG: No results for input(s): GLUCAP in the last 168 hours. Lipid Profile: No results for input(s): CHOL, HDL, LDLCALC, TRIG, CHOLHDL, LDLDIRECT in the last 72 hours. Thyroid  Function Tests: No results for input(s): TSH, T4TOTAL, FREET4, T3FREE, THYROIDAB in the last 72 hours. Anemia Panel: No results for input(s): VITAMINB12, FOLATE, FERRITIN, TIBC, IRON, RETICCTPCT in the last 72 hours. Urine analysis:    Component Value Date/Time   COLORURINE YELLOW (A) 04/15/2020 0904   APPEARANCEUR CLEAR (A) 04/15/2020 0904   APPEARANCEUR Clear 03/17/2015 1140   LABSPEC 1.008 04/15/2020 0904   LABSPEC 1.015 03/17/2015 1140   PHURINE 7.0 04/15/2020 0904   GLUCOSEU NEGATIVE 04/15/2020 0904   GLUCOSEU Negative 03/17/2015 1140   HGBUR NEGATIVE 04/15/2020  0904   BILIRUBINUR NEGATIVE 04/15/2020 0904   BILIRUBINUR Negative 03/17/2015 1140   KETONESUR NEGATIVE 04/15/2020 0904   PROTEINUR NEGATIVE 04/15/2020 0904   NITRITE NEGATIVE 04/15/2020 0904   LEUKOCYTESUR MODERATE (A) 04/15/2020 0904   LEUKOCYTESUR Negative 03/17/2015 1140    Radiological Exams on Admission: CT PELVIS W CONTRAST Result Date: 09/22/2024 EXAM: CT PELVIS, WITH IV CONTRAST 09/22/2024 11:12:06 PM TECHNIQUE: Axial images were acquired through the pelvis with IV contrast. 100mL iohexol (OMNIPAQUE) 300 MG/ML solution was administered intravenously. Reformatted images were reviewed. Automated exposure control, iterative reconstruction, and/or weight based adjustment of the mA/kV was utilized to reduce the radiation dose to as low as reasonably achievable. COMPARISON: CT 11/08/2018 CLINICAL HISTORY: Perianal abscess or fistula suspected. FINDINGS: BONES: No acute fracture or focal osseous lesion. JOINTS: No dislocation. The joint spaces are normal. SOFT TISSUES: Nodular soft tissue thickening along the gluteal cleft extending nferiorly into the perineum and into the  posterior gluteal subcutaneous soft tissues. Inflammatory mass / developing abscess in the left perineum measuring 7.4 cm on series 4 image 141. Peripheral enhancing irregular collection within the subcutaneous fat in the midline caudal soft tissues measures 10.3 x 4.6 cm on series 5 image 112. Right inguinal lymphadenopathy measuring up to 14 mm in short axis is likely reactive. INTRAPELVIC CONTENTS: Limited images of the intrapelvic contents demonstrate no acute abnormality. IMPRESSION: 1. Cellulitis with developing abscesses in the perineum along the gluteal cleft and posterior midline subcutaneous gluteal fat. Electronically signed by: Norman Gatlin MD 09/22/2024 11:32 PM EST RP Workstation: [id number]   Data Reviewed for HPI: Relevant notes from primary care and specialist visits, past discharge summaries as available in EHR, including Care Everywhere. Prior diagnostic testing as pertinent to current admission diagnoses Updated medications and problem lists for reconciliation ED course, including vitals, labs, imaging, treatment and response to treatment Triage notes, nursing and pharmacy notes and ED provider's notes Notable results as noted above in HPI      Assessment and Plan: * Cellulitis and abscess perineum Severe hidradenitis of the perineal area Sherin stage III disease with abscesses/nodules/scarring) [facility]- dermatology/plastics/wound care (interrupted care for several years) Previously used topical clindamycin ,  Infliximab was ordered in 2024 by dermatology Continue Rocephin  and vancomycin  (patient allergic to clindamycin ) Wound care consult for topical care Pain control Surgery on board and will take patient to the OR N.p.o. for procedure in the a.m.  Involuntary commitment Chronic paranoid schizophrenia Patient was brought in by police department due to erratic behavior Will continue IVC until evaluated by behavioral health To consult behavioral health in the  a.m. Has been on Invega  Awaiting med rec to resume meds if any Will have as needed antipsychotics if needed while awaiting behavioral health  COPD (chronic obstructive pulmonary disease) (HCC) Not acutely exacerbated DuoNebs as needed  Hypokalemia K-Dur 40 mill equivalents x 1  Diabetes mellitus without complication (HCC) Sliding scale insulin  coverage  HTN (hypertension) Resume home amlodipine  and olmesartan  pending med rec     DVT prophylaxis: SCD  Consults: surgery  Advance Care Planning:   Code Status: Prior   Family Communication: none  Disposition Plan: Back to previous home environment  Severity of Illness: The appropriate patient status for this patient is INPATIENT. Inpatient status is judged to be reasonable and necessary in order to provide the required intensity of service to ensure the patient's safety. The patient's presenting symptoms, physical exam findings, and initial radiographic and laboratory data in the context of their chronic comorbidities is  felt to place them at high risk for further clinical deterioration. Furthermore, it is not anticipated that the patient will be medically stable for discharge from the hospital within 2 midnights of admission.   * I certify that at the point of admission it is my clinical judgment that the patient will require inpatient hospital care spanning beyond 2 midnights from the point of admission due to high intensity of service, high risk for further deterioration and high frequency of surveillance required.*  Author: Delayne LULLA Solian, MD 09/23/2024 1:03 AM  For on call review [email or web address].

## 2024-09-23 NOTE — Assessment & Plan Note (Signed)
 Resume home amlodipine  and olmesartan  pending med rec

## 2024-09-23 NOTE — Sepsis Progress Note (Signed)
 Elink monitoring for the code sepsis protocol.

## 2024-09-23 NOTE — Progress Notes (Signed)
 ED Pharmacy Antibiotic Sign Off An antibiotic consult was received from an ED provider for Vancomycin  per pharmacy dosing for cellulitis. A chart review was completed to assess appropriateness.   The following one time order(s) were placed:  Vancomycin  1750 mg IV X 1   Further antibiotic and/or antibiotic pharmacy consults should be ordered by the admitting provider if indicated.   Thank you for allowing pharmacy to be a part of this patient's care.   Walter Pena [facility]  Clinical Pharmacist 09/23/24 12:27 AM

## 2024-09-23 NOTE — Group Note (Signed)
 Recreation Therapy Group Note   Group Topic:General Recreation  Group Date: 09/23/2024 Start Time: 1500 End Time: 1600 Facilitators: Celestia Jeoffrey BRAVO, LRT, CTRS Location: Courtyard  Group Description: [employer]. LRT and patients played games of basketball, drew with chalk, and played corn hole while outside in the courtyard while getting fresh air and sunlight. Music was being played in the background. LRT and peers conversed about different games they have played before, what they do in their free time and anything else that is on their minds. LRT encouraged pts to drink water after being outside, sweating and getting their heart rate up.  Goal Area(s) Addressed: Patient will build on frustration tolerance skills. Patients will partake in a competitive play game with peers. Patients will gain knowledge of new leisure interest/hobby.    Affect/Mood: N/A   Participation Level: Did not attend    Clinical Observations/Individualized Feedback: Patient did not attend group.   Plan: Continue to engage patient in RT group sessions 2-3x/week.   Jeoffrey BRAVO Celestia, LRT, CTRS 09/23/2024 5:05 PM

## 2024-09-23 NOTE — Discharge Summary (Signed)
 Walter Pena. FMW:[id number] DOB: 1981/01/26 DOA: 09/22/2024  PCP: Albina GORMAN Dine, MD  Admit date: 09/22/2024 Discharge date: 09/23/2024  Time spent: 35 minutes  Recommendations for Outpatient Follow-up:  Re-establish with [facility] and general surgery     Discharge Diagnoses:  Principal Problem:   Cellulitis and abscess perineum Active Problems:   Hidradenitis suppurativa of anogenital region, severe with cellulitis and abscesses   Involuntary commitment   Schizophrenia, paranoid, chronic (HCC)   HTN (hypertension)   Alcohol use disorder, moderate, dependence (HCC)   Diabetes mellitus without complication (HCC)   Hypokalemia   COPD (chronic obstructive pulmonary disease) (HCC)   Discharge Condition: stable  Diet recommendation: carb modified  Filed Weights   09/22/24 2125  Weight: 74.8 kg    History of present illness:  From admission h and p Walter Pena. is a 43 y.o. male with medical history significant for DM, HTN, COPD, schizophrenia, chronic severe hidradenitis of the perineal area Sherin stage III disease with abscesses nodules and scarring)  with prior surgical interventions, previously seen by [facility] dermatology (2022)with plans to start TNF, being admitted with hidradenitis flare associated with cellulitis and abscesses.  He has followed with [facility] wound care from 10/07/2023 to 07/06/2024 however care was interrupted due to incarceration.  He was actually brought under IVC by police due to erratic behavior.  He denied suicidal or homicidal ideation.  He did complain of worsening of his wounds as well as drainage. In the ED, vitals within normal limits Labs notable for leukocytosis of 14,000, mild anemia of 12.6, mild hypokalemia of 3.3 but otherwise unremarkable and toxicology workup was negative.   CT pelvis with IV contrast showed cellulitis with developing abscesses in the perineum along the gluteal cleft and posterior midline  subcutaneous gluteal fat.   Surgeon, Dr. Tye was consulted from the ED and will see patient and likely take to the OR on 09/23/2024 Patient started on Rocephin  and vancomycin    Admission requested IVC remains.  Psych not consulted from the ED as patient was cooperative and did not present a problem during workup.     Hospital Course:   Patient was brought in by police for agitated behavior and was placed on IVC. He has a long history of severe hidradinitis and has been lost to follow up after a recent incarceration. He has draining lesions around gluteal cleft and CT concerning for cellulitis/abscess but patient evaluated by gen surg and this was deemed to be patient's stable untreated hidradenitis. No surgical or other intervention advised; rather the team did advise re-establishing with [facility] and general surgery. In the meaintime will start patient on oral doxycycline  and resume home metformin . Patient was evaluated by the behavioral health team, who advised maintaining IVC and admission to inpatient behavioral health. He is discharged to that unit.  Procedures: none   Consultations: Gen surg  Discharge Exam: Vitals:   09/23/24 0857 09/23/24 1144  BP: (!) 155/92 (!) 170/96  Pulse: 72 72  Resp: 18 18  Temp: 98.9 F (37.2 C) 98.1 F (36.7 C)  SpO2: 98% 98%    General: NAD Cardiovascular: RRR Respiratory: CTAB Skin: induration and sinus tracks intertriginous locations  Discharge Instructions   Discharge Instructions     Diet - low sodium heart healthy   Complete by: As directed    Discharge wound care:   Complete by: As directed    Keep wounds covered with dry bandage and change bandage daily   Increase activity  slowly   Complete by: As directed       Allergies as of 09/23/2024       Reactions   Allopurinol     Other Reaction(s): Stevens-Johnson syndrome   Meloxicam    Other Reaction(s): Stevens-Johnson syndrome   Bactrim  [sulfamethoxazole-trimethoprim] Nausea And Vomiting   Clindamycin  Other (See Comments)   Unknown   Lisinopril Swelling   Other Rash   aloe        Medication List     STOP taking these medications    allopurinol  100 MG tablet Commonly known as: ZYLOPRIM    fluconazole  100 MG tablet Commonly known as: DIFLUCAN    Invega  Sustenna 156 MG/ML Susy injection Generic drug: paliperidone    levofloxacin  750 MG tablet Commonly known as: LEVAQUIN    traMADol 50 MG tablet Commonly known as: ULTRAM       TAKE these medications    albuterol  108 (90 Base) MCG/ACT inhaler Commonly known as: VENTOLIN  HFA Inhale 2 puffs into the lungs every 6 (six) hours as needed for wheezing or shortness of breath.   albuterol  108 (90 Base) MCG/ACT inhaler Commonly known as: VENTOLIN  HFA Inhale 2 puffs into the lungs every 6 (six) hours as needed for wheezing or shortness of breath.   amLODipine  10 MG tablet Commonly known as: NORVASC  Take 1 tablet (10 mg total) by mouth daily.   doxycycline  100 MG tablet Commonly known as: VIBRA -TABS Take 1 tablet (100 mg total) by mouth daily.   famotidine  20 MG tablet Commonly known as: Pepcid  Take 1 tablet (20 mg total) by mouth daily.   metFORMIN  1000 MG tablet Commonly known as: GLUCOPHAGE  Take 1 tablet (1,000 mg total) by mouth 2 (two) times daily.   olmesartan  20 MG tablet Commonly known as: BENICAR  Take 1 tablet (20 mg total) by mouth daily.               Discharge Care Instructions  (From admission, onward)           Start     Ordered   09/23/24 0000  Discharge wound care:       Comments: Keep wounds covered with dry bandage and change bandage daily   09/23/24 1508           Allergies  Allergen Reactions   Allopurinol      Other Reaction(s): Stevens-Johnson syndrome   Meloxicam     Other Reaction(s): Stevens-Johnson syndrome   Bactrim [Sulfamethoxazole-Trimethoprim] Nausea And Vomiting   Clindamycin  Other (See  Comments)    Unknown   Lisinopril Swelling   Other Rash    aloe    Follow-up Information     Albina GORMAN Dine, MD Follow up.   Specialty: Internal Medicine Contact information: [address] [place] [address] [phone]                  The results of significant diagnostics from this hospitalization (including imaging, microbiology, ancillary and laboratory) are listed below for reference.    Significant Diagnostic Studies: CT PELVIS W CONTRAST Result Date: 09/22/2024 EXAM: CT PELVIS, WITH IV CONTRAST 09/22/2024 11:12:06 PM TECHNIQUE: Axial images were acquired through the pelvis with IV contrast. 100mL iohexol (OMNIPAQUE) 300 MG/ML solution was administered intravenously. Reformatted images were reviewed. Automated exposure control, iterative reconstruction, and/or weight based adjustment of the mA/kV was utilized to reduce the radiation dose to as low as reasonably achievable. COMPARISON: CT 11/08/2018 CLINICAL HISTORY: Perianal abscess or fistula suspected. FINDINGS: BONES: No acute fracture or focal osseous lesion. JOINTS: No  dislocation. The joint spaces are normal. SOFT TISSUES: Nodular soft tissue thickening along the gluteal cleft extending nferiorly into the perineum and into the posterior gluteal subcutaneous soft tissues. Inflammatory mass / developing abscess in the left perineum measuring 7.4 cm on series 4 image 141. Peripheral enhancing irregular collection within the subcutaneous fat in the midline caudal soft tissues measures 10.3 x 4.6 cm on series 5 image 112. Right inguinal lymphadenopathy measuring up to 14 mm in short axis is likely reactive. INTRAPELVIC CONTENTS: Limited images of the intrapelvic contents demonstrate no acute abnormality. IMPRESSION: 1. Cellulitis with developing abscesses in the perineum along the gluteal cleft and posterior midline subcutaneous gluteal fat. Electronically signed by: Norman Gatlin MD 09/22/2024 11:32 PM EST RP  Workstation: [id number]    Microbiology: Recent Results (from the past 240 hours)  Blood culture (routine x 2)     Status: None (Preliminary result)   Collection Time: 09/22/24 11:56 PM   Specimen: BLOOD LEFT HAND  Result Value Ref Range Status   Specimen Description BLOOD LEFT HAND  Final   Special Requests   Final    BOTTLES DRAWN AEROBIC AND ANAEROBIC Blood Culture results may not be optimal due to an inadequate volume of blood received in culture bottles   Culture   Final    NO GROWTH < 12 HOURS Performed at [facility], [address] [place], [place] [address]    Report Status PENDING  Incomplete  Blood culture (routine x 2)     Status: None (Preliminary result)   Collection Time: 09/22/24 11:56 PM   Specimen: BLOOD RIGHT FOREARM  Result Value Ref Range Status   Specimen Description BLOOD RIGHT FOREARM  Final   Special Requests   Final    BOTTLES DRAWN AEROBIC AND ANAEROBIC Blood Culture adequate volume   Culture   Final    NO GROWTH < 12 HOURS Performed at [facility], [address], [place], [place] [address]    Report Status PENDING  Incomplete     Labs: Basic Metabolic Panel: Recent Labs  Lab 09/22/24 2132  NA 136  K 3.3*  CL 98  CO2 28  GLUCOSE 103*  BUN 20  CREATININE 1.16  CALCIUM 8.8*   Liver Function Tests: Recent Labs  Lab 09/22/24 2132  AST 19  ALT 18  ALKPHOS 68  BILITOT 0.9  PROT 10.1*  ALBUMIN 3.4*   No results for input(s): LIPASE, AMYLASE in the last 168 hours. No results for input(s): AMMONIA in the last 168 hours. CBC: Recent Labs  Lab 09/22/24 2132  WBC 14.2*  HGB 12.6*  HCT 38.7*  MCV 86.8  PLT 289   Cardiac Enzymes: No results for input(s): CKTOTAL, CKMB, CKMBINDEX, TROPONINI in the last 168 hours. BNP: BNP (last 3 results) No results for input(s): BNP in the last 8760 hours.  ProBNP (last 3 results) No results for input(s): PROBNP in the last 8760 hours.  CBG: Recent  Labs  Lab 09/23/24 0216 09/23/24 0434 09/23/24 0855 09/23/24 1138  GLUCAP 67* 84 90 110*       Signed:  Devaughn KATHEE Ban MD.  [facility] 09/23/2024, 3:10 PM

## 2024-09-23 NOTE — Assessment & Plan Note (Signed)
 K-Dur 40 mill equivalents x 1

## 2024-09-23 NOTE — Assessment & Plan Note (Signed)
 Not acutely exacerbated DuoNebs as needed

## 2024-09-23 NOTE — Progress Notes (Signed)
 Pharmacy Antibiotic Note  Walter Pena. is a 43 y.o. male admitted on 09/22/2024 with cellulitis.  Pharmacy has been consulted for Vancomycin  dosing.  Plan: Vancomycin  1750 mg IV X 1 loading dose given in ED on 11/7 @ 0032. Vancomycin  1 gm IV Q12H ordered to start on 11/7 @ 1230.  AUC = 533.1 Vanc trough = 15.3   Height: 5' 8 (172.7 cm) Weight: 74.8 kg (165 lb) IBW/kg (Calculated) : 68.4  Temp (24hrs), Avg:98.2 F (36.8 C), Min:98.2 F (36.8 C), Max:98.2 F (36.8 C)  Recent Labs  Lab 09/22/24 2132 09/22/24 2356  WBC 14.2*  --   CREATININE 1.16  --   LATICACIDVEN  --  0.8    Estimated Creatinine Clearance: 78.6 mL/min (by C-G formula based on SCr of 1.16 mg/dL).    Allergies  Allergen Reactions   Allopurinol      Other Reaction(s): Stevens-Johnson syndrome   Meloxicam     Other Reaction(s): Stevens-Johnson syndrome   Bactrim [Sulfamethoxazole-Trimethoprim] Nausea And Vomiting   Clindamycin  Other (See Comments)    Unknown   Lisinopril Swelling   Other Rash    aloe    Antimicrobials this admission:   >>    >>   Dose adjustments this admission:   Microbiology results:  BCx:   UCx:    Sputum:    MRSA PCR:   Thank you for allowing pharmacy to be a part of this patient's care.  Torin Whisner D 09/23/2024 1:21 AM

## 2024-09-23 NOTE — Progress Notes (Signed)
 Pt admitted IVC with erratic behavior denies SI/HI/AVH Pt was cooperative during admission, oriented to unit and all questions answered. Pt on parole Ms.Foster [phone] ex [phone] cell [phone]. RN called and left message because pt said he had an apt in a couple days.SABRA     09/23/24 1754  Psych Admission Type (Psych Patients Only)  Admission Status Involuntary  Psychosocial Assessment  Patient Complaints None  Eye Contact Fair  Facial Expression Flat  Affect Blunted  Speech Soft  Interaction Assertive  Motor Activity Other (Comment) (WNL)  Appearance/Hygiene In scrubs  Behavior Characteristics Cooperative;Calm  Mood Suspicious  Thought Process  Coherency Disorganized  Content WDL  Delusions None reported or observed  Perception WDL  Hallucination None reported or observed  Judgment WDL  Confusion WDL  Danger to Self  Current suicidal ideation? Denies

## 2024-09-23 NOTE — Progress Notes (Signed)
 Pharmacy Antibiotic Note  Walter Karl. is a 43 y.o. male admitted on 09/22/2024 with cellulitis/abscess of perineum.  Pharmacy has been consulted for Vancomycin  dosing.  Plan: Continue vancomycin  1 gram IV every 12 hours Estimated AUC 533, Cmin 14.2 IBW, Scr 1.16, Vd 0.72 Vancomycin  levels at steady state or as clinically indicated Ceftriaxone  1 gram IV every 24 hours per provider Follow renal function and cultures for adjustments  Height: 5' 8 (172.7 cm) Weight: 74.8 kg (165 lb) IBW/kg (Calculated) : 68.4  Temp (24hrs), Avg:98.3 F (36.8 C), Min:97.4 F (36.3 C), Max:99.2 F (37.3 C)  Recent Labs  Lab 09/22/24 2132 09/22/24 2356  WBC 14.2*  --   CREATININE 1.16  --   LATICACIDVEN  --  0.8    Estimated Creatinine Clearance: 78.6 mL/min (by C-G formula based on SCr of 1.16 mg/dL).    Allergies  Allergen Reactions   Allopurinol      Other Reaction(s): Stevens-Johnson syndrome   Meloxicam     Other Reaction(s): Stevens-Johnson syndrome   Bactrim [Sulfamethoxazole-Trimethoprim] Nausea And Vomiting   Clindamycin  Other (See Comments)    Unknown   Lisinopril Swelling   Other Rash    aloe    Antimicrobials this admission:  Vancomycin  11/7 >>   Ceftriaxone  11/6 >>    Microbiology results:  11/6 BCx: pending   Thank you for allowing pharmacy to be a part of this patient's care.  Kayla JULIANNA Blew, PharmD, BCPS 09/23/2024 7:39 AM

## 2024-09-24 LAB — GLUCOSE, CAPILLARY
Glucose-Capillary: 121 mg/dL — ABNORMAL HIGH (ref 70–99)
Glucose-Capillary: 148 mg/dL — ABNORMAL HIGH (ref 70–99)
Glucose-Capillary: 82 mg/dL (ref 70–99)
Glucose-Capillary: 87 mg/dL (ref 70–99)

## 2024-09-24 NOTE — Group Note (Signed)
 Date:  09/24/2024 Time:  6:40 PM  Group Topic/Focus:  Overcoming Stress:   The focus of this group is to define stress and help patients assess their triggers.    Participation Level:  Active  Participation Quality:  Appropriate  Affect:  Appropriate  Cognitive:  Alert  Insight: Appropriate  Engagement in Group:  Engaged  Modes of Intervention:  Activity, Discussion, and Education  Additional Comments:    Skippy LITTIE Bennett 09/24/2024, 6:40 PM

## 2024-09-24 NOTE — Group Note (Signed)
 LCSW Group Therapy Note   Group Date: 09/24/2024 Start Time: 1335 End Time: 1432   Type of Therapy and Topic:  Group Therapy: Identifying Stressors and Supports  Participation Level:  Did Not Attend     Summary of Patient Progress:  The patient did not attend group.    Roselyn GORMAN Lento, LCSWA 09/24/2024  2:39 PM

## 2024-09-24 NOTE — Group Note (Signed)
 Date:  09/24/2024 Time:  7:12 PM  Group Topic/Focus:  Activity Group: The focus of the group is to encourage patients to go outside to the courtyard and get some fresh air and some exercise.    Participation Level:  Active  Participation Quality:  Appropriate  Affect:  Appropriate  Cognitive:  Appropriate  Insight: Appropriate  Engagement in Group:  Engaged  Modes of Intervention:  Activity  Additional Comments:    Camellia HERO Jenafer Winterton 09/24/2024, 7:12 PM

## 2024-09-24 NOTE — Plan of Care (Signed)
  Problem: Education: Goal: Emotional status will improve Outcome: Progressing   Problem: Education: Goal: Knowledge of [facility] Education information/materials will improve Outcome: Progressing   Problem: Education: Goal: Mental status will improve Outcome: Progressing   Problem: Education: Goal: Verbalization of understanding the information provided will improve Outcome: Progressing

## 2024-09-24 NOTE — Progress Notes (Signed)
   09/24/24 0908  Psych Admission Type (Psych Patients Only)  Admission Status Involuntary  Psychosocial Assessment  Patient Complaints None  Eye Contact Fair  Facial Expression Flat  Affect Blunted  Speech Slow  Interaction Assertive  Motor Activity Other (Comment) (WNL)  Appearance/Hygiene In scrubs  Behavior Characteristics Cooperative;Appropriate to situation  Mood Anxious  Thought Process  Coherency Disorganized  Content WDL  Delusions None reported or observed  Perception WDL  Hallucination None reported or observed  Judgment WDL  Confusion WDL  Danger to Self  Current suicidal ideation? Denies

## 2024-09-24 NOTE — Plan of Care (Signed)
  Problem: Education: Goal: Mental status will improve Outcome: Progressing   Problem: Activity: Goal: Risk for activity intolerance will decrease Outcome: Progressing

## 2024-09-24 NOTE — Progress Notes (Signed)
   09/23/24 2000  Psych Admission Type (Psych Patients Only)  Admission Status Involuntary  Psychosocial Assessment  Patient Complaints None  Eye Contact Fair  Facial Expression Flat  Affect Blunted  Speech Soft  Interaction Assertive  Motor Activity Slow  Appearance/Hygiene In scrubs  Behavior Characteristics Cooperative;Appropriate to situation  Mood Anxious;Suspicious  Thought Process  Coherency Tangential  Content WDL  Delusions WDL  Perception WDL  Hallucination None reported or observed  Judgment WDL  Confusion WDL  Danger to Self  Current suicidal ideation? Denies   Patient appears restless and withdrawn to his room, he forwards very little information, he was suspicious of staff, affect is blunted and congruent with mood. 15 minutes safety checks maintained.

## 2024-09-25 LAB — GLUCOSE, CAPILLARY
Glucose-Capillary: 111 mg/dL — ABNORMAL HIGH (ref 70–99)
Glucose-Capillary: 118 mg/dL — ABNORMAL HIGH (ref 70–99)
Glucose-Capillary: 138 mg/dL — ABNORMAL HIGH (ref 70–99)
Glucose-Capillary: 116 mg/dL — ABNORMAL HIGH (ref 70–99)

## 2024-09-25 MED ORDER — QUETIAPINE FUMARATE 100 MG PO TABS
100.0000 mg | ORAL_TABLET | Freq: Every day | ORAL | Status: DC
  Filled 2024-09-25: qty 1

## 2024-09-25 NOTE — Plan of Care (Signed)
   Problem: Education: Goal: Knowledge of [facility] Education information/materials will improve Outcome: Progressing Goal: Emotional status will improve Outcome: Progressing Goal: Mental status will improve Outcome: Progressing Goal: Verbalization of understanding the information provided will improve Outcome: Progressing   Problem: Activity: Goal: Interest or engagement in activities will improve Outcome: Progressing Goal: Sleeping patterns will improve Outcome: Progressing   Problem: Coping: Goal: Ability to verbalize frustrations and anger appropriately will improve Outcome: Progressing Goal: Ability to demonstrate self-control will improve Outcome: Progressing   Problem: Health Behavior/Discharge Planning: Goal: Identification of resources available to assist in meeting health care needs will improve Outcome: Progressing Goal: Compliance with treatment plan for underlying cause of condition will improve Outcome: Progressing   Problem: Physical Regulation: Goal: Ability to maintain clinical measurements within normal limits will improve Outcome: Progressing   Problem: Safety: Goal: Periods of time without injury will increase Outcome: Progressing   Problem: Education: Goal: Knowledge of General Education information will improve Description: Including pain rating scale, medication(s)/side effects and non-pharmacologic comfort measures Outcome: Progressing   Problem: Health Behavior/Discharge Planning: Goal: Ability to manage health-related needs will improve Outcome: Progressing   Problem: Clinical Measurements: Goal: Ability to maintain clinical measurements within normal limits will improve Outcome: Progressing Goal: Will remain free from infection Outcome: Progressing Goal: Diagnostic test results will improve Outcome: Progressing Goal: Respiratory complications will improve Outcome: Progressing Goal: Cardiovascular complication will be  avoided Outcome: Progressing   Problem: Activity: Goal: Risk for activity intolerance will decrease Outcome: Progressing   Problem: Nutrition: Goal: Adequate nutrition will be maintained Outcome: Progressing   Problem: Coping: Goal: Level of anxiety will decrease Outcome: Progressing   Problem: Elimination: Goal: Will not experience complications related to bowel motility Outcome: Progressing Goal: Will not experience complications related to urinary retention Outcome: Progressing   Problem: Pain Managment: Goal: General experience of comfort will improve and/or be controlled Outcome: Progressing   Problem: Safety: Goal: Ability to remain free from injury will improve Outcome: Progressing   Problem: Skin Integrity: Goal: Risk for impaired skin integrity will decrease Outcome: Progressing

## 2024-09-25 NOTE — Progress Notes (Signed)
   09/25/24 0850  Psychosocial Assessment  Patient Complaints None  Eye Contact Fair  Facial Expression Flat  Affect Blunted  Speech Slow  Interaction Assertive  Motor Activity Other (Comment) (WNL)  Appearance/Hygiene In scrubs  Behavior Characteristics Appropriate to situation  Mood Anxious  Thought Process  Coherency WDL  Content WDL  Delusions None reported or observed  Perception WDL  Hallucination None reported or observed  Judgment WDL  Confusion WDL  Danger to Self  Current suicidal ideation? Denies  Danger to Others  Danger to Others None reported or observed
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
## 2024-09-25 NOTE — [facility] Counselor (Signed)
 Adult Comprehensive Assessment  Patient ID: Walter Pena., male   DOB: 04-30-1981, 43 y.o.   MRN: [id number]  Information Source: Information source: Patient  Current Stressors:  Patient states their primary concerns and needs for treatment are:: I just got out of prison on 09/17/2024. My mama said I was talking off the wall but I don't remember. I was in prison for 5 years. Patient states their goals for this hospitilization and ongoing recovery are:: I need to get my infusion and I'm trying to get used to being out of prison. Educational / Learning stressors: None reported Employment / Job issues: None reported Family Relationships: Patient denies Surveyor, Quantity / Lack of resources (include bankruptcy): None reported Housing / Lack of housing: Patient lives with his mother Physical health (include injuries & life threatening diseases): None reported Social relationships: None reported Substance abuse: None reported Bereavement / Loss: None reported  Living/Environment/Situation:  Living Arrangements: Parent Living conditions (as described by patient or guardian): Patient lives with his mother and feels safe there Who else lives in the home?: Patient's mother How long has patient lived in current situation?: Patient has lived with his mother off and on often. What is atmosphere in current home: Comfortable, Loving  Family History:  Marital status: Single Does patient have children?: Yes How many children?: 42 How is patient's relationship with their children?: It started off I had a relationship with them but it's a long story now.  Childhood History:  By whom was/is the patient raised?: Mother, Father Additional childhood history information: My homelife was great. We traveled alot from here to [place], [place] where my dad was from Patient's description of current relationship with people who raised him/her: Patient still has a good relationship with his  mother. Does patient have siblings?: Yes Number of Siblings: 5 Description of patient's current relationship with siblings: Great. We all talk. Did patient suffer any verbal/emotional/physical/sexual abuse as a child?: No Did patient suffer from severe childhood neglect?: No Has patient ever been sexually abused/assaulted/raped as an adolescent or adult?: No Was the patient ever a victim of a crime or a disaster?: No Witnessed domestic violence?: No Has patient been affected by domestic violence as an adult?: No  Education:  Highest grade of school patient has completed: I'm a college grad. BAche[employer]'s in Business Management. 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?: No  Financial Resources:   Surveyor, Quantity resources: Insurance Claims Handler, Medicare Does patient have a lawyer or guardian?: No  Alcohol/Substance Abuse:   What has been your use of drugs/alcohol within the last 12 months?: Patient denies Alcohol/Substance Abuse Treatment Hx: Denies past history  Social Support System:   Forensic Psychologist System: Production Assistant, Radio System: Patient states his mother is supportive Type of faith/religion: Christianity  Leisure/Recreation:   Do You Have Hobbies?: Yes Leisure and Hobbies: Ride and breed horses  Strengths/Needs:   What is the patient's perception of their strengths?: 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 Patient states these barriers may affect their return to the community: None reported  Discharge Plan:   Currently receiving community mental health  services: No  Patient states concerns and preferences for aftercare planning are: Patient just got out of prison and has no services in place. Patient states they will know when they are safe and ready for discharge when: Im fine. My mom just tried to call. I'm just waiting on her. Does patient have access to transportation?: Yes (My mom or one of my brothers) Patient description of barriers related to discharge medications: Patient reports no barriers Will patient be returning to same living situation after discharge?: Yes  Summary/Recommendations:   Summary and Recommendations (to be completed by the evaluator): . 43 y.o. male patient brought in via [employer] under IVC. Paperwork states patient recently released from prison and has hx of schizophrenia, not taking any meds with escalations in behavior/ acting erratic. Patient states he does not need meds in triage, denies wanting to harm anyone or himself. Patients reports he will be residing with his mother upon discharge and family will provide transportation. Recommendations include: crisis stabilization, therapeutic milieu, encourage group attendance and participation, medication management for detox/mood stabilization and development of comprehensive mental wellness/sobriety plan.   Aldo HERO Jasmyn Picha. 09/25/2024

## 2024-09-25 NOTE — Progress Notes (Signed)
   09/25/24 2136  Psych Admission Type (Psych Patients Only)  Admission Status Involuntary  Psychosocial Assessment  Patient Complaints None  Eye Contact Fair  Facial Expression Flat  Affect Blunted  Speech Slow  Interaction Assertive  Motor Activity Other (Comment) (WNL)  Appearance/Hygiene In scrubs  Behavior Characteristics Appropriate to situation  Mood Anxious  Thought Process  Coherency WDL  Content WDL  Delusions None reported or observed  Perception WDL  Hallucination None reported or observed  Judgment WDL  Confusion WDL  Danger to Self  Current suicidal ideation? Denies  Danger to Others  Danger to Others None reported or observed

## 2024-09-25 NOTE — [facility] Suicide Risk Assessment (Signed)
 [facility] INPATIENT:  Family/Significant Other Suicide Prevention Education  Suicide Prevention Education:  Patient Refusal for Family/Significant Other Suicide Prevention Education: The patient Walter Lewis Schramm Jr. has refused to provide written consent for family/significant other to be provided Family/Significant Other Suicide Prevention Education during admission and/or prior to discharge.  Physician notified.  Aldo HERO Tyleigh Mahn 09/25/2024, 1:50 PM

## 2024-09-25 NOTE — H&P (Signed)
 Psychiatric Admission Assessment Adult  Patient Identification: Walter Pena. MRN:  [id number] Date of Evaluation:  09/25/2024 Chief Complaint:  Schizophrenia (HCC) [F20.9]   History of Present Illness: : Patient brought in via [employer] tonight under IVC. Paperwork states patient recently released from prison and has hx of schizophrenia, not taking any meds with escalations in behavior/ acting erratic. Patient states he does not need meds in triage, denies wanting to harm anyone or himself. He does endorse some wounds from previous boil surgery a year ago that is not healing . Psychiatry evaluated the patient after medical stabilization and admitted to inpatient unit. Patient is admitted to adult psych unit with Q15 min safety monitoring. Multidisciplinary team approach is offered. Medication management; group/milieu therapy is offered.   Patient is noted to be resting in bed.  He reports that he got released from prison November 4.  He reports that he lives with his mom in an apartment.  He reports that he has a civil service fast streamer and she tracks wherever he goes.  He reports that he is trying to get his own apartment and have a life.  When asked about who called 911 patient reports that certain people that he cannot be around came by and he called 911 to stay away from them.  Patient gives illogical responses regarding the events that led up to the ED visit.  Patient denies any history of mental health problems and denies taking any medications in prison.  He denies auditory/visual hallucinations, denies paranoia denies experiences of thought insertion and thought blocking.  He is not displaying any ideas of reference.  He does make some disorganized statements that he went to [employer] and he studied technical sales engineer.  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  Patient lacks insight into his mental health problems and consistently denies needing any medications.  He denies  current symptoms of depression, denies feeling anxious or having panic attacks.  He does acknowledge having sleeping problems and reports sleeping for 2 to 3 hours per night.  He reports fair appetite, good energy and motivation.  He denies SI/HI/plan.  He did finally agree to take Seroquel for sleep and anxiety or mood Total Time spent with patient: 1 hour Sleep  Sleep:Sleep: Fair  Past Psychiatric History:  Psychiatric History:  Information collected from patient/chart  Prev Dx/Sx: Schizophrenia Current Psych Provider: none Home Meds (current): none Previous Med Trials: invega  Therapy: none   Prior Psych Hospitalization: yes  Prior Self Harm: denied Prior Violence: yes   Family Psych History: denied Family Hx suicide: denied   Social History:   Educational Hx: unknown Occupational Hx: mother reported patient not currently working 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 [employer] Situation: with mother Spiritual Hx: unknown Access to weapons/lethal means: Denied    Substance History Alcohol: Denied  Tobacco: Smokes cigarettes daily Illicit drugs: denied Prescription drug abuse: denied Rehab hx: denied Is the patient at risk to self? No.  Has the patient been a risk to self in the past 6 months? No.  Has the patient been a risk to self within the distant past? No.  Is the patient a risk to others? No.  Has the patient been a risk to others in the past 6 months? No.  Has the patient been a risk to others within the distant past? No.   Columbia Scale:  Flowsheet Row Admission (Current) from 09/23/2024 in [facility]ICINE ED to Hosp-Admission (Discharged) from  09/22/2024 in [facility]  C-SSRS RISK CATEGORY No Risk No Risk     Past Medical History:  Past Medical History:  Diagnosis Date   CHF (congestive heart failure) (HCC)    COPD (chronic obstructive pulmonary disease) (HCC)    Gout    Hidradenitis     Hypertension    Schizophrenia (HCC)     Past Surgical History:  Procedure Laterality Date   sweat gland removal     Family History:  Family History  Problem Relation Age of Onset   Diabetes Mellitus II Mother    Hypertension Mother    Hypertension Father     Social History:  Social History   Substance and Sexual Activity  Alcohol Use Not Currently   Comment: Socially     Social History   Substance and Sexual Activity  Drug Use Not Currently   Types: Marijuana      Allergies:   Allergies  Allergen Reactions   Allopurinol      Other Reaction(s): Stevens-Johnson syndrome   Meloxicam     Other Reaction(s): Stevens-Johnson syndrome   Bactrim [Sulfamethoxazole-Trimethoprim] Nausea And Vomiting   Clindamycin  Other (See Comments)    Unknown   Lisinopril Swelling   Other Rash    aloe   Lab Results:  Results for orders placed or performed during the hospital encounter of 09/23/24 (from the past 48 hours)  Glucose, capillary     Status: Abnormal   Collection Time: 09/23/24  9:11 PM  Result Value Ref Range   Glucose-Capillary 112 (H) 70 - 99 mg/dL    Comment: Glucose reference range applies only to samples taken after fasting for at least 8 hours.  Glucose, capillary     Status: None   Collection Time: 09/24/24 12:16 AM  Result Value Ref Range   Glucose-Capillary 82 70 - 99 mg/dL    Comment: Glucose reference range applies only to samples taken after fasting for at least 8 hours.  Glucose, capillary     Status: Abnormal   Collection Time: 09/24/24  4:12 AM  Result Value Ref Range   Glucose-Capillary 121 (H) 70 - 99 mg/dL    Comment: Glucose reference range applies only to samples taken after fasting for at least 8 hours.  Glucose, capillary     Status: Abnormal   Collection Time: 09/24/24  7:01 AM  Result Value Ref Range   Glucose-Capillary 148 (H) 70 - 99 mg/dL    Comment: Glucose reference range applies only to samples taken after fasting for at least 8 hours.   Glucose, capillary     Status: None   Collection Time: 09/24/24  8:03 PM  Result Value Ref Range   Glucose-Capillary 87 70 - 99 mg/dL    Comment: Glucose reference range applies only to samples taken after fasting for at least 8 hours.    Blood Alcohol level:  Lab Results  Component Value Date   [facility] <15 09/22/2024   ETH <10 10/20/2017    Metabolic Disorder Labs:  Lab Results  Component Value Date   HGBA1C 5.5 09/22/2024   MPG 111.15 09/22/2024   MPG 211.6 04/21/2020   Lab Results  Component Value Date   PROLACTIN 27.2 (H) 05/08/2016   PROLACTIN 6.9 01/19/2016   Lab Results  Component Value Date   CHOL 116 10/21/2017   TRIG 54 10/21/2017   HDL 33 (L) 10/21/2017   CHOLHDL 3.5 10/21/2017   VLDL 11 10/21/2017   LDLCALC 72 10/21/2017  LDLCALC 70 02/17/2017    Current Medications: Current Facility-Administered Medications  Medication Dose Route Frequency Provider Last Rate Last Admin   acetaminophen  (TYLENOL ) tablet 650 mg  650 mg Oral Q6H PRN Smith, Annie B, NP       Or   acetaminophen  (TYLENOL ) suppository 650 mg  650 mg Rectal Q6H PRN Smith, Annie B, NP       albuterol  (PROVENTIL ) (2.5 MG/3ML) 0.083% nebulizer solution 2.5 mg  2.5 mg Inhalation Q6H PRN Smith, Annie B, NP       alum & mag hydroxide-simeth (MAALOX/MYLANTA) 200-200-20 MG/5ML suspension 30 mL  30 mL Oral Q4H PRN Smith, Annie B, NP       amLODipine  (NORVASC ) tablet 5 mg  5 mg Oral Daily Smith, Annie B, NP   5 mg at 09/24/24 9091   haloperidol  (HALDOL ) tablet 5 mg  5 mg Oral TID PRN Smith, Annie B, NP       And   diphenhydrAMINE  (BENADRYL ) capsule 50 mg  50 mg Oral TID PRN Smith, Annie B, NP       haloperidol  lactate (HALDOL ) injection 5 mg  5 mg Intramuscular TID PRN Smith, Annie B, NP       And   diphenhydrAMINE  (BENADRYL ) injection 50 mg  50 mg Intramuscular TID PRN Smith, Annie B, NP       And   LORazepam  (ATIVAN ) injection 2 mg  2 mg Intramuscular TID PRN Smith, Annie B, NP       haloperidol   lactate (HALDOL ) injection 10 mg  10 mg Intramuscular TID PRN Smith, Annie B, NP       And   diphenhydrAMINE  (BENADRYL ) injection 50 mg  50 mg Intramuscular TID PRN Smith, Annie B, NP       And   LORazepam  (ATIVAN ) injection 2 mg  2 mg Intramuscular TID PRN Smith, Annie B, NP       doxycycline  (VIBRA -TABS) tablet 100 mg  100 mg Oral BID Smith, Annie B, NP   100 mg at 09/24/24 1800   HYDROcodone -acetaminophen  (NORCO/VICODIN) 5-325 MG per tablet 1-2 tablet  1-2 tablet Oral Q4H PRN Smith, Annie B, NP   1 tablet at 09/23/24 2149   hydrOXYzine  (ATARAX ) tablet 25 mg  25 mg Oral TID PRN Smith, Annie B, NP       insulin  aspart (novoLOG ) injection 0-15 Units  0-15 Units Subcutaneous Q4H Smith, Annie B, NP       irbesartan  (AVAPRO ) tablet 75 mg  75 mg Oral Daily Smith, Annie B, NP   75 mg at 09/24/24 9091   magnesium  hydroxide (MILK OF MAGNESIA) suspension 30 mL  30 mL Oral Daily PRN Smith, Annie B, NP       traZODone  (DESYREL ) tablet 50 mg  50 mg Oral QHS PRN Smith, Annie B, NP   50 mg at 09/23/24 2149   PTA Medications: Medications Prior to Admission  Medication Sig Dispense Refill Last Dose/Taking   albuterol  (VENTOLIN  HFA) 108 (90 Base) MCG/ACT inhaler Inhale 2 puffs into the lungs every 6 (six) hours as needed for wheezing or shortness of breath. (Patient not taking: Reported on 09/23/2024) 1 g 0    albuterol  (VENTOLIN  HFA) 108 (90 Base) MCG/ACT inhaler Inhale 2 puffs into the lungs every 6 (six) hours as needed for wheezing or shortness of breath. (Patient not taking: Reported on 09/23/2024) 18 g 0    amLODipine  (NORVASC ) 10 MG tablet Take 1 tablet (10 mg total) by mouth daily. (Patient not taking: Reported  on 09/23/2024) 30 tablet 1    doxycycline  (VIBRA -TABS) 100 MG tablet Take 1 tablet (100 mg total) by mouth daily.      famotidine  (PEPCID ) 20 MG tablet Take 1 tablet (20 mg total) by mouth daily. 30 tablet 1    metFORMIN  (GLUCOPHAGE ) 1000 MG tablet Take 1 tablet (1,000 mg total) by mouth 2 (two)  times daily. (Patient not taking: Reported on 09/23/2024) 60 tablet 1    olmesartan  (BENICAR ) 20 MG tablet Take 1 tablet (20 mg total) by mouth daily. (Patient not taking: Reported on 09/23/2024) 30 tablet 1     Psychiatric Specialty Exam:  Presentation  General Appearance:  Casual  Eye Contact: Fair  Speech: Clear and Coherent  Speech Volume: Decreased    Mood and Affect  Mood: Anxious  Affect: Depressed   Thought Process  Thought Processes: Disorganized  Descriptions of Associations:Loose  Orientation:Partial  Thought Content:Illogical; Delusions  Hallucinations:Hallucinations: None  Ideas of Reference:Delusions  Suicidal Thoughts:Suicidal Thoughts: No  Homicidal Thoughts:Homicidal Thoughts: No   Sensorium  Memory: Immediate Fair  Judgment: Impaired  Insight: Shallow   Executive Functions  Concentration: Poor  Attention Span: Fair  Recall: [employer] of Knowledge: Fair  Language: Fair   Psychomotor Activity  Psychomotor Activity: Psychomotor Activity: Normal   Assets  Assets: Communication Skills; Resilience; Social Support    Musculoskeletal: Strength & Muscle Tone: within normal limits Gait & Station: normal  Physical Exam: Physical Exam Vitals and nursing note reviewed.  HENT:     Head: Normocephalic.     Nose: Nose normal.  Cardiovascular:     Rate and Rhythm: Normal rate.     Pulses: Normal pulses.  Pulmonary:     Effort: Pulmonary effort is normal.  Neurological:     Mental Status: He is alert.    Review of Systems  Constitutional: Negative.   HENT: Negative.    Eyes: Negative.   Cardiovascular: Negative.   Skin: Negative.    Blood pressure (!) 150/93, pulse 68, temperature 100 F (37.8 C), temperature source Oral, resp. rate 18, height 5' 9 (1.753 m), weight 77.1 kg, SpO2 100%. Body mass index is 25.1 kg/m.  Principal Diagnosis: <principal problem not specified> Diagnosis:  Active Problems:    Schizophrenia [facility])   Clinical Decision Making: Patient with history of schizophrenia currently admitted for bizarre behaviors.  He has chronic insomnia and hidradenitis.  He is currently inpatient to be monitored for psychosis  Treatment Plan Summary:  Safety and Monitoring:             -- Involuntary admission to inpatient psychiatric unit for safety, stabilization and treatment             -- Daily contact with patient to assess and evaluate symptoms and progress in treatment             -- Patient's case to be discussed in multi-disciplinary team meeting             -- Observation Level: q15 minute checks             -- Vital signs:  q12 hours             -- Precautions: suicide, elopement, and assault   2. Psychiatric Diagnoses and Treatment:            Seroquel 100mg  qhs       -- The risks/benefits/side-effects/alternatives to this medication were discussed in detail with the patient and time was given for questions. The  patient consents to medication trial.                -- Metabolic profile and EKG monitoring obtained while on an atypical antipsychotic (BMI: Lipid Panel: HbgA1c: QTc:)              -- Encouraged patient to participate in unit milieu and in scheduled group therapies                            3. Medical Issues Being Addressed:      4. Discharge Planning:              -- Social work and case management to assist with discharge planning and identification of hospital follow-up needs prior to discharge             -- Estimated LOS: 5-7 days             -- Discharge Concerns: Need to establish a safety plan; Medication compliance and effectiveness             -- Discharge Goals: Return home with outpatient referrals follow ups  Physician Treatment Plan for Primary Diagnosis: <principal problem not specified> Long Term Goal(s): Improvement in symptoms so as ready for discharge  Short Term Goals: Ability to identify changes in lifestyle to reduce recurrence of  condition will improve, Ability to verbalize feelings will improve, Ability to disclose and discuss suicidal ideas, Ability to demonstrate self-control will improve, and Ability to identify and develop effective coping behaviors will improve  Physician Treatment Plan for Secondary Diagnosis: Active Problems:   Schizophrenia (HCC)  Long Term Goal(s): Improvement in symptoms so as ready for discharge  Short Term Goals: Ability to identify changes in lifestyle to reduce recurrence of condition will improve, Ability to verbalize feelings will improve, Ability to disclose and discuss suicidal ideas, Ability to demonstrate self-control will improve, and Ability to identify and develop effective coping behaviors will improve  I certify that inpatient services furnished can reasonably be expected to improve the patient's condition.    Raushanah Osmundson, MD 11/9/202512:20 AM

## 2024-09-25 NOTE — Group Note (Signed)
 Date:  09/25/2024 Time:  8:48 PM  Group Topic/Focus:  Making Healthy Choices:   The focus of this group is to help patients identify negative/unhealthy choices they were using prior to admission and identify positive/healthier coping strategies to replace them upon discharge. Wrap-Up Group:   The focus of this group is to help patients review their daily goal of treatment and discuss progress on daily workbooks.    Participation Level:  Active  Participation Quality:  Appropriate and Attentive  Affect:  Appropriate  Cognitive:  Alert, Appropriate, and Oriented  Insight: Appropriate and Good  Engagement in Group:  Engaged  Modes of Intervention:  Discussion and Support  Additional Comments:  N/A  Butler LITTIE Gelineau 09/25/2024, 8:48 PM

## 2024-09-25 NOTE — Group Note (Signed)
 Date:  09/25/2024 Time:  4:41 AM  Group Topic/Focus:  Identifying Needs:   The focus of this group is to help patients identify their personal needs that have been historically problematic and identify healthy behaviors to address their needs. Making Healthy Choices:   The focus of this group is to help patients identify negative/unhealthy choices they were using prior to admission and identify positive/healthier coping strategies to replace them upon discharge. Overcoming Stress:   The focus of this group is to define stress and help patients assess their triggers. Wrap-Up Group:   The focus of this group is to help patients review their daily goal of treatment and discuss progress on daily workbooks.    Participation Level:  Active  Participation Quality:  Appropriate and Attentive  Affect:  Appropriate  Cognitive:  Alert, Appropriate, and Oriented  Insight: Appropriate and Good  Engagement in Group:  Engaged  Modes of Intervention:  Discussion and Support  Additional Comments:  N/A  Walter Pena 09/25/2024, 4:41 AM

## 2024-09-25 NOTE — Plan of Care (Signed)
   Problem: Education: Goal: Knowledge of [facility] Education information/materials will improve Outcome: Progressing   Problem: Education: Goal: Emotional status will improve Outcome: Progressing   Problem: Education: Goal: Mental status will improve Outcome: Progressing   Problem: Education: Goal: Verbalization of understanding the information provided will improve Outcome: Progressing

## 2024-09-25 NOTE — Progress Notes (Signed)
 [facility] MD Progress Note  09/25/2024 8:59 PM Walter Pena.  MRN:  [id number]  Patient brought in via [facility] Department tonight under IVC. Paperwork states patient recently released from prison and has hx of schizophrenia, not taking any meds with escalations in behavior/ acting erratic. Patient states he does not need meds in triage, denies wanting to harm anyone or himself. He does endorse some wounds from previous boil surgery a year ago that is not healing . Psychiatry evaluated the patient after medical stabilization and admitted to inpatient unit. Patient is admitted to adult psych unit with Q15 min safety monitoring. Multidisciplinary team approach is offered. Medication management; group/milieu therapy is offered.  Subjective:  Chart reviewed, case discussed in multidisciplinary meeting, patient seen during rounds.   Patient is noted to be resting in bed.  He continues to display lack of insight into the mental health problems and continues to deny the need for any medications.  Provider tried to encourage him to take Seroquel for insomnia but he declined.  He denies auditory/visual hallucinations and keeps repeating himself saying they are all real.  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  Patient is minimizing the symptoms denies SI/HI/plan.  Will continue to evaluate the need for forced medication.  Past Psychiatric History: see h&P Family History:  Family History  Problem Relation Age of Onset   Diabetes Mellitus II Mother    Hypertension Mother    Hypertension Father    Social History:  Social History   Substance and Sexual Activity  Alcohol Use Not Currently   Comment: Socially     Social History   Substance and Sexual Activity  Drug Use Not Currently   Types: Marijuana    Social History   Socioeconomic History    Marital status: Single    Spouse name: Not on file   Number of children: Not on file   Years of education: Not on file   Highest education level: Not on file  Occupational History   Not on file  Tobacco Use   Smoking status: Every Day    Current packs/day: 1.00    Average packs/day: 1 pack/day for 25.0 years (25.0 ttl pk-yrs)    Types: Cigarettes   Smokeless tobacco: Never  Substance and Sexual Activity   Alcohol use: Not Currently    Comment: Socially   Drug use: Not Currently    Types: Marijuana   Sexual activity: Not on file  Other Topics Concern   Not on file  Social History Narrative   Not on file   Social Drivers of Health   Financial Resource Strain: Not on file  Food Insecurity: No Food Insecurity (09/23/2024)   Hunger Vital Sign    Worried About Running Out of Food in the Last Year: Never true    Ran Out of Food in the Last Year: Never true  Recent Concern: Food Insecurity - Food Insecurity Present (09/23/2024)   Hunger Vital Sign    Worried About Running Out of Food in the Last Year: Often true    Ran Out of Food in the Last Year: Often true  Transportation Needs: No Transportation Needs (09/23/2024)   PRAPARE - Administrator, Civil Service (Medical): No    Lack of Transportation (Non-Medical): No  Physical Activity: Not on file  Stress: Not on file  Social Connections:  Not on file   Past Medical History:  Past Medical History:  Diagnosis Date   CHF (congestive heart failure) (HCC)    COPD (chronic obstructive pulmonary disease) (HCC)    Gout    Hidradenitis    Hypertension    Schizophrenia (HCC)     Past Surgical History:  Procedure Laterality Date   sweat gland removal      Current Medications: Current Facility-Administered Medications  Medication Dose Route Frequency Provider Last Rate Last Admin   acetaminophen  (TYLENOL ) tablet 650 mg  650 mg Oral Q6H PRN Smith, Annie B, NP       Or   acetaminophen  (TYLENOL ) suppository 650 mg  650  mg Rectal Q6H PRN Smith, Annie B, NP       albuterol  (PROVENTIL ) (2.5 MG/3ML) 0.083% nebulizer solution 2.5 mg  2.5 mg Inhalation Q6H PRN Smith, Annie B, NP       alum & mag hydroxide-simeth (MAALOX/MYLANTA) 200-200-20 MG/5ML suspension 30 mL  30 mL Oral Q4H PRN Smith, Annie B, NP       amLODipine  (NORVASC ) tablet 5 mg  5 mg Oral Daily Smith, Annie B, NP   5 mg at 09/25/24 9150   haloperidol  (HALDOL ) tablet 5 mg  5 mg Oral TID PRN Smith, Annie B, NP       And   diphenhydrAMINE  (BENADRYL ) capsule 50 mg  50 mg Oral TID PRN Smith, Annie B, NP       haloperidol  lactate (HALDOL ) injection 5 mg  5 mg Intramuscular TID PRN Smith, Annie B, NP       And   diphenhydrAMINE  (BENADRYL ) injection 50 mg  50 mg Intramuscular TID PRN Smith, Annie B, NP       And   LORazepam  (ATIVAN ) injection 2 mg  2 mg Intramuscular TID PRN Smith, Annie B, NP       haloperidol  lactate (HALDOL ) injection 10 mg  10 mg Intramuscular TID PRN Smith, Annie B, NP       And   diphenhydrAMINE  (BENADRYL ) injection 50 mg  50 mg Intramuscular TID PRN Smith, Annie B, NP       And   LORazepam  (ATIVAN ) injection 2 mg  2 mg Intramuscular TID PRN Smith, Annie B, NP       doxycycline  (VIBRA -TABS) tablet 100 mg  100 mg Oral BID Smith, Annie B, NP   100 mg at 09/25/24 1759   HYDROcodone -acetaminophen  (NORCO/VICODIN) 5-325 MG per tablet 1-2 tablet  1-2 tablet Oral Q4H PRN Smith, Annie B, NP   1 tablet at 09/23/24 2149   hydrOXYzine  (ATARAX ) tablet 25 mg  25 mg Oral TID PRN Smith, Annie B, NP       insulin  aspart (novoLOG ) injection 0-15 Units  0-15 Units Subcutaneous Q4H Smith, Annie B, NP       irbesartan  (AVAPRO ) tablet 75 mg  75 mg Oral Daily Smith, Annie B, NP   75 mg at 09/25/24 0847   magnesium  hydroxide (MILK OF MAGNESIA) suspension 30 mL  30 mL Oral Daily PRN Smith, Annie B, NP       QUEtiapine (SEROQUEL) tablet 100 mg  100 mg Oral QHS Arrin Pintor, MD       traZODone  (DESYREL ) tablet 50 mg  50 mg Oral QHS PRN Smith, Annie B, NP   50  mg at 09/23/24 2149    Lab Results:  Results for orders placed or performed during the hospital encounter of 09/23/24 (from the past 48 hours)  Glucose, capillary  Status: Abnormal   Collection Time: 09/23/24  9:11 PM  Result Value Ref Range   Glucose-Capillary 112 (H) 70 - 99 mg/dL    Comment: Glucose reference range applies only to samples taken after fasting for at least 8 hours.  Glucose, capillary     Status: None   Collection Time: 09/24/24 12:16 AM  Result Value Ref Range   Glucose-Capillary 82 70 - 99 mg/dL    Comment: Glucose reference range applies only to samples taken after fasting for at least 8 hours.  Glucose, capillary     Status: Abnormal   Collection Time: 09/24/24  4:12 AM  Result Value Ref Range   Glucose-Capillary 121 (H) 70 - 99 mg/dL    Comment: Glucose reference range applies only to samples taken after fasting for at least 8 hours.  Glucose, capillary     Status: Abnormal   Collection Time: 09/24/24  7:01 AM  Result Value Ref Range   Glucose-Capillary 148 (H) 70 - 99 mg/dL    Comment: Glucose reference range applies only to samples taken after fasting for at least 8 hours.  Glucose, capillary     Status: None   Collection Time: 09/24/24  8:03 PM  Result Value Ref Range   Glucose-Capillary 87 70 - 99 mg/dL    Comment: Glucose reference range applies only to samples taken after fasting for at least 8 hours.  Glucose, capillary     Status: Abnormal   Collection Time: 09/25/24  6:22 AM  Result Value Ref Range   Glucose-Capillary 111 (H) 70 - 99 mg/dL    Comment: Glucose reference range applies only to samples taken after fasting for at least 8 hours.  Glucose, capillary     Status: Abnormal   Collection Time: 09/25/24 11:11 AM  Result Value Ref Range   Glucose-Capillary 118 (H) 70 - 99 mg/dL    Comment: Glucose reference range applies only to samples taken after fasting for at least 8 hours.  Glucose, capillary     Status: Abnormal   Collection Time:  09/25/24  4:10 PM  Result Value Ref Range   Glucose-Capillary 138 (H) 70 - 99 mg/dL    Comment: Glucose reference range applies only to samples taken after fasting for at least 8 hours.  Glucose, capillary     Status: Abnormal   Collection Time: 09/25/24  8:12 PM  Result Value Ref Range   Glucose-Capillary 116 (H) 70 - 99 mg/dL    Comment: Glucose reference range applies only to samples taken after fasting for at least 8 hours.    Blood Alcohol level:  Lab Results  Component Value Date   [facility] <15 09/22/2024   ETH <10 10/20/2017    Metabolic Disorder Labs: Lab Results  Component Value Date   HGBA1C 5.5 09/22/2024   MPG 111.15 09/22/2024   MPG 211.6 04/21/2020   Lab Results  Component Value Date   PROLACTIN 27.2 (H) 05/08/2016   PROLACTIN 6.9 01/19/2016   Lab Results  Component Value Date   CHOL 116 10/21/2017   TRIG 54 10/21/2017   HDL 33 (L) 10/21/2017   CHOLHDL 3.5 10/21/2017   VLDL 11 10/21/2017   LDLCALC 72 10/21/2017   LDLCALC 70 02/17/2017    Physical Findings: AIMS:  , ,  ,  ,    CIWA:    COWS:      Psychiatric Specialty Exam:  Presentation  General Appearance:  Casual  Eye Contact: Fair  Speech: Clear and Coherent  Speech  Volume: Decreased    Mood and Affect  Mood: Anxious  Affect: Depressed   Thought Process  Thought Processes: Disorganized  Orientation:Partial  Thought Content:Illogical; Delusions  Hallucinations:Hallucinations: None  Ideas of Reference:Delusions  Suicidal Thoughts:Suicidal Thoughts: No  Homicidal Thoughts:Homicidal Thoughts: No   Sensorium  Memory: Immediate Fair  Judgment: Impaired  Insight: Shallow   Executive Functions  Concentration: Poor  Attention Span: Fair  Recall: Fair  Fund of Knowledge: Fair  Language: Fair   Psychomotor Activity  Psychomotor Activity: Psychomotor Activity: Normal  Musculoskeletal: Strength & Muscle Tone: within normal limits Gait & Station:  normal Assets  Assets: Manufacturing Systems Engineer; Resilience; Social Support    Physical Exam: Physical Exam ROS Blood pressure (!) 159/97, pulse 76, temperature 98.7 F (37.1 C), temperature source Oral, resp. rate 18, height 5' 9 (1.753 m), weight 77.1 kg, SpO2 100%. Body mass index is 25.1 kg/m.  Diagnosis: Active Problems:   Schizophrenia, paranoid [facility])   Clinical Decision Making: Patient with history of schizophrenia currently admitted for bizarre behaviors.  He has chronic insomnia and hidradenitis.  He is currently inpatient to be monitored for psychosis  Treatment Plan Summary:  Safety and Monitoring:             -- Involuntary admission to inpatient psychiatric unit for safety, stabilization and treatment             -- Daily contact with patient to assess and evaluate symptoms and progress in treatment             -- Patient's case to be discussed in multi-disciplinary team meeting             -- Observation Level: q15 minute checks             -- Vital signs:  q12 hours             -- Precautions: suicide, elopement, and assault   2. Psychiatric Diagnoses and Treatment:            Seroquel 100mg  qhs-patient is declining Will continue to monitor and evaluate the need for nonemergent forced medication Will consider to switch to Zyprexa  or Risperdal depending on the intensity of bizarre behaviors/delusions     -- The risks/benefits/side-effects/alternatives to this medication were discussed in detail with the patient and time was given for questions. The patient consents to medication trial.                -- Metabolic profile and EKG monitoring obtained while on an atypical antipsychotic (BMI: Lipid Panel: HbgA1c: QTc:)              -- Encouraged patient to participate in unit milieu and in scheduled group therapies                            3. Medical Issues Being Addressed:    No urgent medical needs needed  4. Discharge Planning:   -- Social work and case  management to assist with discharge planning and identification of hospital follow-up needs prior to discharge  -- Estimated LOS: 3-4 days  Allyn Foil, MD 09/25/2024, 8:59 PM

## 2024-09-25 NOTE — Group Note (Signed)
 Date:  09/25/2024 Time:  10:06 AM  Group Topic/Focus:  Goals Group:   The focus of this group is to help patients establish daily goals to achieve during treatment and discuss how the patient can incorporate goal setting into their daily lives to aide in recovery.    Participation Level:  Active  Participation Quality:  Appropriate  Affect:  Appropriate  Cognitive:  Alert  Insight: Appropriate  Engagement in Group:  Engaged  Modes of Intervention:  Activity, Discussion, and Education  Additional Comments:    Skippy LITTIE Bennett 09/25/2024, 10:06 AM

## 2024-09-25 NOTE — Progress Notes (Signed)
   09/25/24 0100  Psych Admission Type (Psych Patients Only)  Admission Status Involuntary  Psychosocial Assessment  Patient Complaints None  Eye Contact Fair  Facial Expression Flat  Affect Blunted  Speech Slow  Interaction Assertive  Motor Activity Slow  Appearance/Hygiene In scrubs  Behavior Characteristics Appropriate to situation;Cooperative  Mood Anxious  Aggressive Behavior  Effect No apparent injury  Thought Process  Coherency Disorganized  Content WDL  Delusions None reported or observed  Perception WDL  Hallucination None reported or observed  Judgment WDL  Confusion WDL  Danger to Self  Current suicidal ideation? Denies

## 2024-09-25 NOTE — Plan of Care (Signed)
   Problem: Education: Goal: Emotional status will improve Outcome: Progressing Goal: Mental status will improve Outcome: Progressing

## 2024-09-26 LAB — GLUCOSE, CAPILLARY
Glucose-Capillary: 142 mg/dL — ABNORMAL HIGH (ref 70–99)
Glucose-Capillary: 100 mg/dL — ABNORMAL HIGH (ref 70–99)
Glucose-Capillary: 128 mg/dL — ABNORMAL HIGH (ref 70–99)
Glucose-Capillary: 108 mg/dL — ABNORMAL HIGH (ref 70–99)
Glucose-Capillary: 104 mg/dL — ABNORMAL HIGH (ref 70–99)
Glucose-Capillary: 122 mg/dL — ABNORMAL HIGH (ref 70–99)

## 2024-09-26 MED ORDER — AMLODIPINE BESYLATE 5 MG PO TABS
10.0000 mg | ORAL_TABLET | Freq: Every day | ORAL | Status: DC
  Administered 2024-09-27 – 2024-09-29 (×3): 10 mg via ORAL
  Filled 2024-09-26 (×3): qty 2

## 2024-09-26 NOTE — Group Note (Signed)
 Date:  09/26/2024 Time:  6:52 PM  Group Topic/Focus:  Early Warning Signs:   The focus of this group is to help patients identify signs or symptoms they exhibit before slipping into an unhealthy state or crisis.    Participation Level:  Did Not Attend   Walter Pena 09/26/2024, 6:52 PM

## 2024-09-26 NOTE — Group Note (Signed)
 Date:  09/26/2024 Time:  11:04 PM  Group Topic/Focus:  Wrap-Up Group:   The focus of this group is to help patients review their daily goal of treatment and discuss progress on daily workbooks.    Participation Level:  Active  Participation Quality:  Appropriate and Attentive  Affect:  Appropriate  Cognitive:  Alert and Appropriate  Insight: Appropriate and Good  Engagement in Group:  Engaged  Modes of Intervention:  Orientation  Additional Comments:     Arlester CHRISTELLA Servant 09/26/2024, 11:04 PM

## 2024-09-26 NOTE — Progress Notes (Signed)
 [facility] MD Progress Note  09/26/2024 12:37 PM Walter Pena.  MRN:  [id number]  Patient brought in via [facility] Department tonight under IVC. Paperwork states patient recently released from prison and has hx of schizophrenia, not taking any meds with escalations in behavior/ acting erratic. Patient states he does not need meds in triage, denies wanting to harm anyone or himself. He does endorse some wounds from previous boil surgery a year ago that is not healing . Psychiatry evaluated the patient after medical stabilization and admitted to inpatient unit. Patient is admitted to adult psych unit with Q15 min safety monitoring. Multidisciplinary team approach is offered. Medication management; group/milieu therapy is offered.  Subjective:  Chart reviewed, case discussed in multidisciplinary meeting, patient seen during rounds.   Patient is noted to be resting in bed.  He was superficially engaging in interview he reports poor sleep in the night but continues to refuse to take medications.  He is attending some groups but minimally engaging.  Per nursing staff he is not displaying any aggressive behaviors.  With the provider he remains discharge focused and when provider tried to discuss treatment options patient got irritable and became selectively mute.  Called patient's mom Ms. Isbell [phone] who expressed her concern about patient not taking medications and having a history of being on long-acting injectable before he went to prison.  Provider discussed in detail patient not meeting criteria for nonemergent forced medication at this time and only a judge can order forced medication in the community.  Past Psychiatric History: see h&P Family History:  Family History  Problem Relation Age of Onset   Diabetes Mellitus II Mother    Hypertension Mother    Hypertension Father    Social History:  Social History   Substance and Sexual Activity  Alcohol Use Not Currently    Comment: Socially     Social History   Substance and Sexual Activity  Drug Use Not Currently   Types: Marijuana    Social History   Socioeconomic History   Marital status: Single    Spouse name: Not on file   Number of children: Not on file   Years of education: Not on file   Highest education level: Not on file  Occupational History   Not on file  Tobacco Use   Smoking status: Every Day    Current packs/day: 1.00    Average packs/day: 1 pack/day for 25.0 years (25.0 ttl pk-yrs)    Types: Cigarettes   Smokeless tobacco: Never  Substance and Sexual Activity   Alcohol use: Not Currently    Comment: Socially   Drug use: Not Currently    Types: Marijuana   Sexual activity: Not on file  Other Topics Concern   Not on file  Social History Narrative   Not on file   Social Drivers of Health   Financial Resource Strain: Not on file  Food Insecurity: No Food Insecurity (09/23/2024)   Hunger Vital Sign    Worried About Running Out of Food in the Last Year: Never true    Ran Out of Food in the Last Year: Never true  Recent Concern: Food Insecurity - Food Insecurity Present (09/23/2024)   Hunger Vital Sign    Worried About Running Out of Food in the Last Year: Often true    Ran Out of Food in the Last Year: Often true  Transportation Needs: No Transportation Needs (09/23/2024)   PRAPARE - Administrator, Civil Service (Medical):  No    Lack of Transportation (Non-Medical): No  Physical Activity: Not on file  Stress: Not on file  Social Connections: Not on file   Past Medical History:  Past Medical History:  Diagnosis Date   CHF (congestive heart failure) (HCC)    COPD (chronic obstructive pulmonary disease) (HCC)    Gout    Hidradenitis    Hypertension    Schizophrenia (HCC)     Past Surgical History:  Procedure Laterality Date   sweat gland removal      Current Medications: Current Facility-Administered Medications  Medication Dose Route Frequency  Provider Last Rate Last Admin   acetaminophen  (TYLENOL ) tablet 650 mg  650 mg Oral Q6H PRN Smith, Annie B, NP       Or   acetaminophen  (TYLENOL ) suppository 650 mg  650 mg Rectal Q6H PRN Smith, Annie B, NP       albuterol  (PROVENTIL ) (2.5 MG/3ML) 0.083% nebulizer solution 2.5 mg  2.5 mg Inhalation Q6H PRN Smith, Annie B, NP       alum & mag hydroxide-simeth (MAALOX/MYLANTA) 200-200-20 MG/5ML suspension 30 mL  30 mL Oral Q4H PRN Smith, Annie B, NP       amLODipine  (NORVASC ) tablet 5 mg  5 mg Oral Daily Smith, Annie B, NP   5 mg at 09/26/24 9142   haloperidol  (HALDOL ) tablet 5 mg  5 mg Oral TID PRN Smith, Annie B, NP       And   diphenhydrAMINE  (BENADRYL ) capsule 50 mg  50 mg Oral TID PRN Smith, Annie B, NP       haloperidol  lactate (HALDOL ) injection 5 mg  5 mg Intramuscular TID PRN Smith, Annie B, NP       And   diphenhydrAMINE  (BENADRYL ) injection 50 mg  50 mg Intramuscular TID PRN Smith, Annie B, NP       And   LORazepam  (ATIVAN ) injection 2 mg  2 mg Intramuscular TID PRN Smith, Annie B, NP       haloperidol  lactate (HALDOL ) injection 10 mg  10 mg Intramuscular TID PRN Smith, Annie B, NP       And   diphenhydrAMINE  (BENADRYL ) injection 50 mg  50 mg Intramuscular TID PRN Smith, Annie B, NP       And   LORazepam  (ATIVAN ) injection 2 mg  2 mg Intramuscular TID PRN Smith, Annie B, NP       doxycycline  (VIBRA -TABS) tablet 100 mg  100 mg Oral BID Smith, Annie B, NP   100 mg at 09/26/24 0857   HYDROcodone -acetaminophen  (NORCO/VICODIN) 5-325 MG per tablet 1-2 tablet  1-2 tablet Oral Q4H PRN Smith, Annie B, NP   1 tablet at 09/23/24 2149   hydrOXYzine  (ATARAX ) tablet 25 mg  25 mg Oral TID PRN Smith, Annie B, NP       insulin  aspart (novoLOG ) injection 0-15 Units  0-15 Units Subcutaneous Q4H Smith, Annie B, NP       irbesartan  (AVAPRO ) tablet 75 mg  75 mg Oral Daily Smith, Annie B, NP   75 mg at 09/26/24 9142   magnesium  hydroxide (MILK OF MAGNESIA) suspension 30 mL  30 mL Oral Daily PRN Smith,  Annie B, NP       QUEtiapine (SEROQUEL) tablet 100 mg  100 mg Oral QHS Kinzee Happel, MD       traZODone  (DESYREL ) tablet 50 mg  50 mg Oral QHS PRN Smith, Annie B, NP   50 mg at 09/23/24 2149    Lab Results:  Results for orders placed or performed during the hospital encounter of 09/23/24 (from the past 48 hours)  Glucose, capillary     Status: None   Collection Time: 09/24/24  8:03 PM  Result Value Ref Range   Glucose-Capillary 87 70 - 99 mg/dL    Comment: Glucose reference range applies only to samples taken after fasting for at least 8 hours.  Glucose, capillary     Status: Abnormal   Collection Time: 09/25/24  6:22 AM  Result Value Ref Range   Glucose-Capillary 111 (H) 70 - 99 mg/dL    Comment: Glucose reference range applies only to samples taken after fasting for at least 8 hours.  Glucose, capillary     Status: Abnormal   Collection Time: 09/25/24 11:11 AM  Result Value Ref Range   Glucose-Capillary 118 (H) 70 - 99 mg/dL    Comment: Glucose reference range applies only to samples taken after fasting for at least 8 hours.  Glucose, capillary     Status: Abnormal   Collection Time: 09/25/24  4:10 PM  Result Value Ref Range   Glucose-Capillary 138 (H) 70 - 99 mg/dL    Comment: Glucose reference range applies only to samples taken after fasting for at least 8 hours.  Glucose, capillary     Status: Abnormal   Collection Time: 09/25/24  8:12 PM  Result Value Ref Range   Glucose-Capillary 116 (H) 70 - 99 mg/dL    Comment: Glucose reference range applies only to samples taken after fasting for at least 8 hours.  Glucose, capillary     Status: Abnormal   Collection Time: 09/26/24  1:57 AM  Result Value Ref Range   Glucose-Capillary 142 (H) 70 - 99 mg/dL    Comment: Glucose reference range applies only to samples taken after fasting for at least 8 hours.  Glucose, capillary     Status: Abnormal   Collection Time: 09/26/24  5:05 AM  Result Value Ref Range   Glucose-Capillary 100  (H) 70 - 99 mg/dL    Comment: Glucose reference range applies only to samples taken after fasting for at least 8 hours.  Glucose, capillary     Status: Abnormal   Collection Time: 09/26/24  7:31 AM  Result Value Ref Range   Glucose-Capillary 128 (H) 70 - 99 mg/dL    Comment: Glucose reference range applies only to samples taken after fasting for at least 8 hours.  Glucose, capillary     Status: Abnormal   Collection Time: 09/26/24 11:37 AM  Result Value Ref Range   Glucose-Capillary 108 (H) 70 - 99 mg/dL    Comment: Glucose reference range applies only to samples taken after fasting for at least 8 hours.    Blood Alcohol level:  Lab Results  Component Value Date   [facility] <15 09/22/2024   ETH <10 10/20/2017    Metabolic Disorder Labs: Lab Results  Component Value Date   HGBA1C 5.5 09/22/2024   MPG 111.15 09/22/2024   MPG 211.6 04/21/2020   Lab Results  Component Value Date   PROLACTIN 27.2 (H) 05/08/2016   PROLACTIN 6.9 01/19/2016   Lab Results  Component Value Date   CHOL 116 10/21/2017   TRIG 54 10/21/2017   HDL 33 (L) 10/21/2017   CHOLHDL 3.5 10/21/2017   VLDL 11 10/21/2017   LDLCALC 72 10/21/2017   LDLCALC 70 02/17/2017    Physical Findings: AIMS:  , ,  ,  ,    CIWA:    COWS:  Psychiatric Specialty Exam:  Presentation  General Appearance:  Casual  Eye Contact: Fair  Speech: Clear and Coherent  Speech Volume: Decreased    Mood and Affect  Mood: Anxious  Affect: Depressed   Thought Process  Thought Processes: Disorganized  Orientation:Partial  Thought Content:Illogical; Delusions  Hallucinations:Hallucinations: None  Ideas of Reference:Delusions  Suicidal Thoughts:Suicidal Thoughts: No  Homicidal Thoughts:Homicidal Thoughts: No   Sensorium  Memory: Immediate Fair  Judgment: Impaired  Insight: Shallow   Executive Functions  Concentration: Poor  Attention Span: Fair  Recall: [employer] of  Knowledge: Fair  Language: Fair   Psychomotor Activity  Psychomotor Activity: Psychomotor Activity: Normal  Musculoskeletal: Strength & Muscle Tone: within normal limits Gait & Station: normal Assets  Assets: Manufacturing Systems Engineer; Resilience; Social Support    Physical Exam: Physical Exam Vitals and nursing note reviewed.    ROS Blood pressure (!) 143/112, pulse 70, temperature 98.6 F (37 C), temperature source Oral, resp. rate 18, height 5' 9 (1.753 m), weight 77.1 kg, SpO2 100%. Body mass index is 25.1 kg/m.  Diagnosis: Active Problems:   Schizophrenia, paranoid [facility])   Clinical Decision Making: Patient with history of schizophrenia currently admitted for bizarre behaviors.  He has chronic insomnia and hidradenitis.  He is currently inpatient to be monitored for psychosis  Treatment Plan Summary:  Safety and Monitoring:             -- Involuntary admission to inpatient psychiatric unit for safety, stabilization and treatment             -- Daily contact with patient to assess and evaluate symptoms and progress in treatment             -- Patient's case to be discussed in multi-disciplinary team meeting             -- Observation Level: q15 minute checks             -- Vital signs:  q12 hours             -- Precautions: suicide, elopement, and assault   2. Psychiatric Diagnoses and Treatment:            Seroquel 100mg  qhs-patient is declining Will continue to monitor and evaluate the need for nonemergent forced medication Will consider to switch to Zyprexa  or Risperdal depending on the intensity of bizarre behaviors/delusions     -- The risks/benefits/side-effects/alternatives to this medication were discussed in detail with the patient and time was given for questions. The patient consents to medication trial.                -- Metabolic profile and EKG monitoring obtained while on an atypical antipsychotic (BMI: Lipid Panel: HbgA1c: QTc:)              --  Encouraged patient to participate in unit milieu and in scheduled group therapies                            3. Medical Issues Being Addressed:    No urgent medical needs needed  4. Discharge Planning:   -- Social work and case management to assist with discharge planning and identification of hospital follow-up needs prior to discharge  -- Estimated LOS: 3-4 days  Allyn Foil, MD 09/26/2024, 12:37 PM

## 2024-09-26 NOTE — Plan of Care (Signed)
  Problem: Education: Goal: Knowledge of [facility] Education information/materials will improve Outcome: Progressing Goal: Emotional status will improve Outcome: Progressing Goal: Mental status will improve Outcome: Progressing   Problem: Activity: Goal: Sleeping patterns will improve Outcome: Progressing

## 2024-09-26 NOTE — Group Note (Signed)
 Recreation Therapy Group Note   Group Topic:Healthy Support Systems  Group Date: 09/26/2024 Start Time: 1040 End Time: 1135 Facilitators: Celestia Jeoffrey BRAVO, LRT, CTRS Location: Craft Room  Group Description: Straw Bridge. In groups or individually, patients were given 10 plastic drinking straws and an equal length of masking tape. Using the materials provided, patients were instructed to build a free-standing bridge-like structure to suspend an everyday item (ex: deck of cards) off the floor or table surface. All materials were required to be used in secondary school teacher. LRT facilitated post-activity discussion reviewing the importance of having strong and healthy support systems in our lives. LRT discussed how the people in our lives serve as the tape and the deck of cards we placed on top of our straw structure are the stressors we face in daily life. LRT and pts discussed what happens in our life when things get too heavy for us , and we don't have strong supports outside of the hospital. Pt shared 2 of their healthy supports in their life aloud in the group.   Goal Area(s) Addressed:  Patient will identify 2 healthy supports in their life. Patient will identify skills to successfully complete activity. Patient will identify correlation of this activity to life post-discharge.  Patient will build on frustration tolerance skills. Patient will increase team building and communication skills.    Affect/Mood: Appropriate   Participation Level: Minimal    Clinical Observations/Individualized Feedback: Walter Pena was somewhat active in their participation of session activities and group discussion. Pt identified my mom and sister as healthy supports. Pt did not contribute to the building of the structure.    Plan: Continue to engage patient in RT group sessions 2-3x/week.   Jeoffrey BRAVO Celestia, LRT, CTRS 09/26/2024 1:20 PM

## 2024-09-26 NOTE — Group Note (Signed)
 Date:  09/26/2024 Time:  7:00 PM  Group Topic/Focus:  Activity Group: The focus of the group is to encourage patients to go outside to the courtyard and get some fresh air and some exercise.    Participation Level:  Active  Participation Quality:  Appropriate  Affect:  Appropriate  Cognitive:  Appropriate  Insight: Appropriate  Engagement in Group:  Engaged  Modes of Intervention:  Activity  Additional Comments:    Camellia HERO Rio Kidane 09/26/2024, 7:00 PM

## 2024-09-26 NOTE — BH IP Treatment Plan (Signed)
 Interdisciplinary Treatment and Diagnostic Plan Update  09/26/2024 Time of Session: 2:00PM Walter Pena. MRN: [id number]  Principal Diagnosis: <principal problem not specified>  Secondary Diagnoses: Active Problems:   Schizophrenia, paranoid (HCC)   Current Medications:  Current Facility-Administered Medications  Medication Dose Route Frequency Provider Last Rate Last Admin   acetaminophen  (TYLENOL ) tablet 650 mg  650 mg Oral Q6H PRN Smith, Annie B, NP       Or   acetaminophen  (TYLENOL ) suppository 650 mg  650 mg Rectal Q6H PRN Smith, Annie B, NP       albuterol  (PROVENTIL ) (2.5 MG/3ML) 0.083% nebulizer solution 2.5 mg  2.5 mg Inhalation Q6H PRN Smith, Annie B, NP       alum & mag hydroxide-simeth (MAALOX/MYLANTA) 200-200-20 MG/5ML suspension 30 mL  30 mL Oral Q4H PRN Smith, Annie B, NP       [START ON 09/27/2024] amLODipine  (NORVASC ) tablet 10 mg  10 mg Oral Daily Jadapalle, Sree, MD       haloperidol  (HALDOL ) tablet 5 mg  5 mg Oral TID PRN Smith, Annie B, NP       And   diphenhydrAMINE  (BENADRYL ) capsule 50 mg  50 mg Oral TID PRN Smith, Annie B, NP       haloperidol  lactate (HALDOL ) injection 5 mg  5 mg Intramuscular TID PRN Smith, Annie B, NP       And   diphenhydrAMINE  (BENADRYL ) injection 50 mg  50 mg Intramuscular TID PRN Smith, Annie B, NP       And   LORazepam  (ATIVAN ) injection 2 mg  2 mg Intramuscular TID PRN Smith, Annie B, NP       haloperidol  lactate (HALDOL ) injection 10 mg  10 mg Intramuscular TID PRN Smith, Annie B, NP       And   diphenhydrAMINE  (BENADRYL ) injection 50 mg  50 mg Intramuscular TID PRN Smith, Annie B, NP       And   LORazepam  (ATIVAN ) injection 2 mg  2 mg Intramuscular TID PRN Smith, Annie B, NP       doxycycline  (VIBRA -TABS) tablet 100 mg  100 mg Oral BID Smith, Annie B, NP   100 mg at 09/26/24 0857   HYDROcodone -acetaminophen  (NORCO/VICODIN) 5-325 MG per tablet 1-2 tablet  1-2 tablet Oral Q4H PRN Smith, Annie B, NP   1 tablet at 09/23/24  2149   hydrOXYzine  (ATARAX ) tablet 25 mg  25 mg Oral TID PRN Smith, Annie B, NP       insulin  aspart (novoLOG ) injection 0-15 Units  0-15 Units Subcutaneous Q4H Smith, Annie B, NP       irbesartan  (AVAPRO ) tablet 75 mg  75 mg Oral Daily Smith, Annie B, NP   75 mg at 09/26/24 9142   magnesium  hydroxide (MILK OF MAGNESIA) suspension 30 mL  30 mL Oral Daily PRN Smith, Annie B, NP       QUEtiapine (SEROQUEL) tablet 100 mg  100 mg Oral QHS Jadapalle, Sree, MD       traZODone  (DESYREL ) tablet 50 mg  50 mg Oral QHS PRN Smith, Annie B, NP   50 mg at 09/23/24 2149   PTA Medications: Medications Prior to Admission  Medication Sig Dispense Refill Last Dose/Taking   albuterol  (VENTOLIN  HFA) 108 (90 Base) MCG/ACT inhaler Inhale 2 puffs into the lungs every 6 (six) hours as needed for wheezing or shortness of breath. (Patient not taking: Reported on 09/23/2024) 1 g 0    albuterol  (VENTOLIN  HFA) 108 (90 Base)  MCG/ACT inhaler Inhale 2 puffs into the lungs every 6 (six) hours as needed for wheezing or shortness of breath. (Patient not taking: Reported on 09/23/2024) 18 g 0    amLODipine  (NORVASC ) 10 MG tablet Take 1 tablet (10 mg total) by mouth daily. (Patient not taking: Reported on 09/23/2024) 30 tablet 1    doxycycline  (VIBRA -TABS) 100 MG tablet Take 1 tablet (100 mg total) by mouth daily.      famotidine  (PEPCID ) 20 MG tablet Take 1 tablet (20 mg total) by mouth daily. 30 tablet 1    metFORMIN  (GLUCOPHAGE ) 1000 MG tablet Take 1 tablet (1,000 mg total) by mouth 2 (two) times daily. (Patient not taking: Reported on 09/23/2024) 60 tablet 1    olmesartan  (BENICAR ) 20 MG tablet Take 1 tablet (20 mg total) by mouth daily. (Patient not taking: Reported on 09/23/2024) 30 tablet 1     Patient Stressors: Health problems    Patient Strengths: Ability for insight   Treatment Modalities: Medication Management, Group therapy, Case management,  1 to 1 session with clinician, Psychoeducation, Recreational  therapy.   Physician Treatment Plan for Primary Diagnosis: <principal problem not specified> Long Term Goal(s): Improvement in symptoms so as ready for discharge   Short Term Goals: Ability to identify changes in lifestyle to reduce recurrence of condition will improve Ability to verbalize feelings will improve Ability to disclose and discuss suicidal ideas Ability to demonstrate self-control will improve Ability to identify and develop effective coping behaviors will improve  Medication Management: Evaluate patient's response, side effects, and tolerance of medication regimen.  Therapeutic Interventions: 1 to 1 sessions, Unit Group sessions and Medication administration.  Evaluation of Outcomes: Not Met  Physician Treatment Plan for Secondary Diagnosis: Active Problems:   Schizophrenia, paranoid (HCC)  Long Term Goal(s): Improvement in symptoms so as ready for discharge   Short Term Goals: Ability to identify changes in lifestyle to reduce recurrence of condition will improve Ability to verbalize feelings will improve Ability to disclose and discuss suicidal ideas Ability to demonstrate self-control will improve Ability to identify and develop effective coping behaviors will improve     Medication Management: Evaluate patient's response, side effects, and tolerance of medication regimen.  Therapeutic Interventions: 1 to 1 sessions, Unit Group sessions and Medication administration.  Evaluation of Outcomes: Not Met   RN Treatment Plan for Primary Diagnosis: <principal problem not specified> Long Term Goal(s): Knowledge of disease and therapeutic regimen to maintain health will improve  Short Term Goals: Ability to demonstrate self-control, Ability to participate in decision making will improve, Ability to verbalize feelings will improve, Ability to disclose and discuss suicidal ideas, Ability to identify and develop effective coping behaviors will improve, and Compliance with  prescribed medications will improve  Medication Management: RN will administer medications as ordered by provider, will assess and evaluate patient's response and provide education to patient for prescribed medication. RN will report any adverse and/or side effects to prescribing provider.  Therapeutic Interventions: 1 on 1 counseling sessions, Psychoeducation, Medication administration, Evaluate responses to treatment, Monitor vital signs and CBGs as ordered, Perform/monitor CIWA, COWS, AIMS and Fall Risk screenings as ordered, Perform wound care treatments as ordered.  Evaluation of Outcomes: Not Met   LCSW Treatment Plan for Primary Diagnosis: <principal problem not specified> Long Term Goal(s): Safe transition to appropriate next level of care at discharge, Engage patient in therapeutic group addressing interpersonal concerns.  Short Term Goals: Engage patient in aftercare planning with referrals and resources, Increase social support, Increase ability  to appropriately verbalize feelings, Increase emotional regulation, Facilitate acceptance of mental health diagnosis and concerns, Facilitate patient progression through stages of change regarding substance use diagnoses and concerns, Identify triggers associated with mental health/substance abuse issues, and Increase skills for wellness and recovery  Therapeutic Interventions: Assess for all discharge needs, 1 to 1 time with Social worker, Explore available resources and support systems, Assess for adequacy in community support network, Educate family and significant other(s) on suicide prevention, Complete Psychosocial Assessment, Interpersonal group therapy.  Evaluation of Outcomes: Not Met   Progress in Treatment: Attending groups: Yes. Participating in groups: Yes. Taking medication as prescribed: Yes. Toleration medication: Yes. Family/Significant other contact made: No, will contact:  once permission has been granted Patient  understands diagnosis: Yes. Discussing patient identified problems/goals with staff: Yes. Medical problems stabilized or resolved: Yes. Denies suicidal/homicidal ideation: Yes. Issues/concerns per patient self-inventory: No. Other: none  New problem(s) identified: No, Describe:  none  New Short Term/Long Term Goal(s): detox, elimination of symptoms of psychosis, medication management for mood stabilization; elimination of SI thoughts; development of comprehensive mental wellness/sobriety plan.   Patient Goals:  get my sleep and my medication  Discharge Plan or Barriers: CSW to assist the patient in the development of appropriate discharge plans for the patient.   Reason for Continuation of Hospitalization: Anxiety Depression Medication stabilization Suicidal ideation  Estimated Length of Stay:  1-7 days  Last 3 [place] Suicide Severity Risk Score: Flowsheet Row Admission (Current) from 09/23/2024 in [facility] ED to Hosp-Admission (Discharged) from 09/22/2024 in [facility]  C-SSRS RISK CATEGORY No Risk No Risk    Last PHQ 2/9 Scores:     No data to display          Scribe for Treatment Team: Sherryle JINNY Margo, LCSW 09/26/2024 3:14 PM

## 2024-09-26 NOTE — Progress Notes (Signed)
   09/26/24 0900  Psych Admission Type (Psych Patients Only)  Admission Status Involuntary  Psychosocial Assessment  Patient Complaints None  Eye Contact Fair  Facial Expression Flat  Affect Blunted  Speech Slow  Interaction Assertive  Motor Activity Other (Comment) (WNL for pts)  Appearance/Hygiene In scrubs  Behavior Characteristics Appropriate to situation  Mood Anxious  Thought Process  Coherency WDL  Content WDL  Delusions None reported or observed  Perception WDL  Hallucination None reported or observed  Judgment WDL  Confusion None  Danger to Self  Current suicidal ideation? Denies  Danger to Others  Danger to Others None reported or observed

## 2024-09-26 NOTE — Group Note (Signed)
 [facility] LCSW Group Therapy Note    Group Date: 09/26/2024 Start Time: 1300 End Time: 1400  Type of Therapy and Topic:  Group Therapy:  Overcoming Obstacles  Participation Level:  [facility] PARTICIPATION LEVEL: Did Not Attend   Description of Group:   In this group patients will be encouraged to explore what they see as obstacles to their own wellness and recovery. They will be guided to discuss their thoughts, feelings, and behaviors related to these obstacles. The group will process together ways to cope with barriers, with attention given to specific choices patients can make. Each patient will be challenged to identify changes they are motivated to make in order to overcome their obstacles. This group will be process-oriented, with patients participating in exploration of their own experiences as well as giving and receiving support and challenge from other group members.  Therapeutic Goals: 1. Patient will identify personal and current obstacles as they relate to admission. 2. Patient will identify barriers that currently interfere with their wellness or overcoming obstacles.  3. Patient will identify feelings, thought process and behaviors related to these barriers. 4. Patient will identify two changes they are willing to make to overcome these obstacles:    Summary of Patient Progress Patient did not attend group.    Therapeutic Modalities:   Cognitive Behavioral Therapy Solution Focused Therapy Motivational Interviewing Relapse Prevention Therapy   Nadara JONELLE Fam, LCSW

## 2024-09-27 DIAGNOSIS — F200 Paranoid schizophrenia: Principal | ICD-10-CM

## 2024-09-27 LAB — GLUCOSE, CAPILLARY
Glucose-Capillary: 94 mg/dL (ref 70–99)
Glucose-Capillary: 98 mg/dL (ref 70–99)
Glucose-Capillary: 102 mg/dL — ABNORMAL HIGH (ref 70–99)

## 2024-09-27 NOTE — Progress Notes (Signed)
 [facility] MD Progress Note  09/27/2024 4:45 PM Walter Pena.  MRN:  [id number]  Patient brought in via [facility] tonight under IVC. Paperwork states patient recently released from prison and has hx of schizophrenia, not taking any meds with escalations in behavior/ acting erratic. Patient states he does not need meds in triage, denies wanting to harm anyone or himself. He does endorse some wounds from previous boil surgery a year ago that is not healing . Psychiatry evaluated the patient after medical stabilization and admitted to inpatient unit. Patient is admitted to adult psych unit with Q15 min safety monitoring. Multidisciplinary team approach is offered. Medication management; group/milieu therapy is offered.  Subjective:  Chart reviewed, case discussed in multidisciplinary meeting, patient seen during rounds.   11/11:  11/10: Patient is noted to be resting in bed.  He was superficially engaging in interview he reports poor sleep in the night but continues to refuse to take medications.  He is attending some groups but minimally engaging.  Per nursing staff he is not displaying any aggressive behaviors.  With the provider he remains discharge focused and when provider tried to discuss treatment options patient got irritable and became selectively mute.  Called patient's mom Ms. Gottlieb [phone] who expressed her concern about patient not taking medications and having a history of being on long-acting injectable before he went to prison.  Provider discussed in detail patient not meeting criteria for nonemergent forced medication at this time and only a judge can order forced medication in the community.  Past Psychiatric History: see h&P Family History:  Family History  Problem Relation Age of Onset   Diabetes Mellitus II Mother    Hypertension Mother    Hypertension Father    Social History:  Social History   Substance and Sexual Activity  Alcohol Use Not  Currently   Comment: Socially     Social History   Substance and Sexual Activity  Drug Use Not Currently   Types: Marijuana    Social History   Socioeconomic History   Marital status: Single    Spouse name: Not on file   Number of children: Not on file   Years of education: Not on file   Highest education level: Not on file  Occupational History   Not on file  Tobacco Use   Smoking status: Every Day    Current packs/day: 1.00    Average packs/day: 1 pack/day for 25.0 years (25.0 ttl pk-yrs)    Types: Cigarettes   Smokeless tobacco: Never  Substance and Sexual Activity   Alcohol use: Not Currently    Comment: Socially   Drug use: Not Currently    Types: Marijuana   Sexual activity: Not on file  Other Topics Concern   Not on file  Social History Narrative   Not on file   Social Drivers of Health   Financial Resource Strain: Not on file  Food Insecurity: No Food Insecurity (09/23/2024)   Hunger Vital Sign    Worried About Running Out of Food in the Last Year: Never true    Ran Out of Food in the Last Year: Never true  Recent Concern: Food Insecurity - Food Insecurity Present (09/23/2024)   Hunger Vital Sign    Worried About Running Out of Food in the Last Year: Often true    Ran Out of Food in the Last Year: Often true  Transportation Needs: No Transportation Needs (09/23/2024)   PRAPARE - Transportation    Lack  of Transportation (Medical): No    Lack of Transportation (Non-Medical): No  Physical Activity: Not on file  Stress: Not on file  Social Connections: Not on file   Past Medical History:  Past Medical History:  Diagnosis Date   CHF (congestive heart failure) (HCC)    COPD (chronic obstructive pulmonary disease) (HCC)    Gout    Hidradenitis    Hypertension    Schizophrenia (HCC)     Past Surgical History:  Procedure Laterality Date   sweat gland removal      Current Medications: Current Facility-Administered Medications  Medication Dose Route  Frequency Provider Last Rate Last Admin   acetaminophen  (TYLENOL ) tablet 650 mg  650 mg Oral Q6H PRN Smith, Annie B, NP       Or   acetaminophen  (TYLENOL ) suppository 650 mg  650 mg Rectal Q6H PRN Smith, Annie B, NP       albuterol  (PROVENTIL ) (2.5 MG/3ML) 0.083% nebulizer solution 2.5 mg  2.5 mg Inhalation Q6H PRN Smith, Annie B, NP       alum & mag hydroxide-simeth (MAALOX/MYLANTA) 200-200-20 MG/5ML suspension 30 mL  30 mL Oral Q4H PRN Smith, Annie B, NP       amLODipine  (NORVASC ) tablet 10 mg  10 mg Oral Daily Jadapalle, Sree, MD   10 mg at 09/27/24 0750   haloperidol  (HALDOL ) tablet 5 mg  5 mg Oral TID PRN Smith, Annie B, NP       And   diphenhydrAMINE  (BENADRYL ) capsule 50 mg  50 mg Oral TID PRN Smith, Annie B, NP       haloperidol  lactate (HALDOL ) injection 5 mg  5 mg Intramuscular TID PRN Smith, Annie B, NP       And   diphenhydrAMINE  (BENADRYL ) injection 50 mg  50 mg Intramuscular TID PRN Smith, Annie B, NP       And   LORazepam  (ATIVAN ) injection 2 mg  2 mg Intramuscular TID PRN Smith, Annie B, NP       haloperidol  lactate (HALDOL ) injection 10 mg  10 mg Intramuscular TID PRN Smith, Annie B, NP       And   diphenhydrAMINE  (BENADRYL ) injection 50 mg  50 mg Intramuscular TID PRN Smith, Annie B, NP       And   LORazepam  (ATIVAN ) injection 2 mg  2 mg Intramuscular TID PRN Smith, Annie B, NP       doxycycline  (VIBRA -TABS) tablet 100 mg  100 mg Oral BID Smith, Annie B, NP   100 mg at 09/27/24 0750   HYDROcodone -acetaminophen  (NORCO/VICODIN) 5-325 MG per tablet 1-2 tablet  1-2 tablet Oral Q4H PRN Smith, Annie B, NP   1 tablet at 09/23/24 2149   hydrOXYzine  (ATARAX ) tablet 25 mg  25 mg Oral TID PRN Smith, Annie B, NP       irbesartan  (AVAPRO ) tablet 75 mg  75 mg Oral Daily Smith, Annie B, NP   75 mg at 09/26/24 9142   magnesium  hydroxide (MILK OF MAGNESIA) suspension 30 mL  30 mL Oral Daily PRN Smith, Annie B, NP       QUEtiapine (SEROQUEL) tablet 100 mg  100 mg Oral QHS Jadapalle, Sree, MD        traZODone  (DESYREL ) tablet 50 mg  50 mg Oral QHS PRN Smith, Annie B, NP   50 mg at 09/23/24 2149    Lab Results:  Results for orders placed or performed during the hospital encounter of 09/23/24 (from the past 48 hours)  Glucose, capillary     Status: Abnormal   Collection Time: 09/25/24  8:12 PM  Result Value Ref Range   Glucose-Capillary 116 (H) 70 - 99 mg/dL    Comment: Glucose reference range applies only to samples taken after fasting for at least 8 hours.  Glucose, capillary     Status: Abnormal   Collection Time: 09/26/24  1:57 AM  Result Value Ref Range   Glucose-Capillary 142 (H) 70 - 99 mg/dL    Comment: Glucose reference range applies only to samples taken after fasting for at least 8 hours.  Glucose, capillary     Status: Abnormal   Collection Time: 09/26/24  5:05 AM  Result Value Ref Range   Glucose-Capillary 100 (H) 70 - 99 mg/dL    Comment: Glucose reference range applies only to samples taken after fasting for at least 8 hours.  Glucose, capillary     Status: Abnormal   Collection Time: 09/26/24  7:31 AM  Result Value Ref Range   Glucose-Capillary 128 (H) 70 - 99 mg/dL    Comment: Glucose reference range applies only to samples taken after fasting for at least 8 hours.  Glucose, capillary     Status: Abnormal   Collection Time: 09/26/24 11:37 AM  Result Value Ref Range   Glucose-Capillary 108 (H) 70 - 99 mg/dL    Comment: Glucose reference range applies only to samples taken after fasting for at least 8 hours.  Glucose, capillary     Status: Abnormal   Collection Time: 09/26/24  4:52 PM  Result Value Ref Range   Glucose-Capillary 104 (H) 70 - 99 mg/dL    Comment: Glucose reference range applies only to samples taken after fasting for at least 8 hours.  Glucose, capillary     Status: Abnormal   Collection Time: 09/26/24  7:58 PM  Result Value Ref Range   Glucose-Capillary 122 (H) 70 - 99 mg/dL    Comment: Glucose reference range applies only to samples  taken after fasting for at least 8 hours.  Glucose, capillary     Status: None   Collection Time: 09/27/24  1:10 AM  Result Value Ref Range   Glucose-Capillary 94 70 - 99 mg/dL    Comment: Glucose reference range applies only to samples taken after fasting for at least 8 hours.  Glucose, capillary     Status: None   Collection Time: 09/27/24  5:00 AM  Result Value Ref Range   Glucose-Capillary 98 70 - 99 mg/dL    Comment: Glucose reference range applies only to samples taken after fasting for at least 8 hours.  Glucose, capillary     Status: Abnormal   Collection Time: 09/27/24  1:29 PM  Result Value Ref Range   Glucose-Capillary 102 (H) 70 - 99 mg/dL    Comment: Glucose reference range applies only to samples taken after fasting for at least 8 hours.    Blood Alcohol level:  Lab Results  Component Value Date   [facility] <15 09/22/2024   ETH <10 10/20/2017    Metabolic Disorder Labs: Lab Results  Component Value Date   HGBA1C 5.5 09/22/2024   MPG 111.15 09/22/2024   MPG 211.6 04/21/2020   Lab Results  Component Value Date   PROLACTIN 27.2 (H) 05/08/2016   PROLACTIN 6.9 01/19/2016   Lab Results  Component Value Date   CHOL 116 10/21/2017   TRIG 54 10/21/2017   HDL 33 (L) 10/21/2017   CHOLHDL 3.5 10/21/2017   VLDL 11 10/21/2017  LDLCALC 72 10/21/2017   LDLCALC 70 02/17/2017    Physical Findings: AIMS:  , ,  ,  ,    CIWA:    COWS:      Psychiatric Specialty Exam:  Presentation  General Appearance:  Casual  Eye Contact: Fair  Speech: Clear and Coherent  Speech Volume: Decreased    Mood and Affect  Mood: Anxious  Affect: Depressed   Thought Process  Thought Processes: Disorganized  Orientation:Partial  Thought Content:Illogical; Delusions  Hallucinations:No data recorded  Ideas of Reference:Delusions  Suicidal Thoughts:No data recorded  Homicidal Thoughts:No data recorded   Sensorium  Memory: Immediate  Fair  Judgment: Impaired  Insight: Shallow   Executive Functions  Concentration: Poor  Attention Span: Fair  Recall: [employer] of Knowledge: Fair  Language: Fair   Psychomotor Activity  Psychomotor Activity: No data recorded  Musculoskeletal: Strength & Muscle Tone: within normal limits Gait & Station: normal Assets  Assets: Manufacturing Systems Engineer; Resilience; Social Support    Physical Exam: Physical Exam Vitals and nursing note reviewed.    ROS Blood pressure (!) 140/97, pulse 86, temperature 98.8 F (37.1 C), temperature source Oral, resp. rate 20, height 5' 9 (1.753 m), weight 77.1 kg, SpO2 100%. Body mass index is 25.1 kg/m.  Diagnosis: Active Problems:   Schizophrenia, paranoid [facility])   Clinical Decision Making: Patient with history of schizophrenia currently admitted for bizarre behaviors.  He has chronic insomnia and hidradenitis.  He is currently inpatient to be monitored for psychosis  Treatment Plan Summary:  Safety and Monitoring:             -- Involuntary admission to inpatient psychiatric unit for safety, stabilization and treatment             -- Daily contact with patient to assess and evaluate symptoms and progress in treatment             -- Patient's case to be discussed in multi-disciplinary team meeting             -- Observation Level: q15 minute checks             -- Vital signs:  q12 hours             -- Precautions: suicide, elopement, and assault   2. Psychiatric Diagnoses and Treatment:            Seroquel 100mg  qhs-patient is declining Will continue to monitor and evaluate the need for nonemergent forced medication Will consider to switch to Zyprexa  or Risperdal depending on the intensity of bizarre behaviors/delusions     -- The risks/benefits/side-effects/alternatives to this medication were discussed in detail with the patient and time was given for questions. The patient consents to medication trial.                 -- Metabolic profile and EKG monitoring obtained while on an atypical antipsychotic (BMI: Lipid Panel: HbgA1c: QTc:)              -- Encouraged patient to participate in unit milieu and in scheduled group therapies                            3. Medical Issues Being Addressed:    No urgent medical needs needed  4. Discharge Planning:   -- Social work and case management to assist with discharge planning and identification of hospital follow-up needs prior to discharge  -- Estimated LOS:  3-4 days  Camelia LITTIE Lukes, PA-C 09/27/2024, 4:45 PM

## 2024-09-27 NOTE — Group Note (Signed)
 Date:  09/27/2024 Time:  6:36 PM  Group Topic/Focus:   Structured Activity  Participation Level:  Minimal  Participation Quality:  Appropriate  Affect:  Appropriate  Cognitive:  Appropriate  Insight: Appropriate  Engagement in Group:  Engaged  Modes of Intervention:  Activity  Additional Comments:    Walter Pena A [name] 09/27/2024, 6:36 PM

## 2024-09-27 NOTE — Group Note (Signed)
 Date:  09/27/2024 Time:  8:55 PM  Group Topic/Focus:  Wrap-Up Group:   The focus of this group is to help patients review their daily goal of treatment and discuss progress on daily workbooks.    Participation Level:  Active  Participation Quality:  Appropriate and Attentive  Affect:  Appropriate  Cognitive:  Alert and Appropriate  Insight: Appropriate and Good  Engagement in Group:  Engaged  Modes of Intervention:  Reality Testing  Additional Comments:     Arlester CHRISTELLA Servant 09/27/2024, 8:55 PM

## 2024-09-27 NOTE — Group Note (Signed)
 [facility] LCSW Group Therapy Note   Group Date: 09/27/2024 Start Time: 1300 End Time: 1400   Type of Therapy/Topic:  Group Therapy:  Emotion Regulation  Participation Level:  Did Not Attend   Mood:  Description of Group:    The purpose of this group is to assist patients in learning to regulate negative emotions and experience positive emotions. Patients will be guided to discuss ways in which they have been vulnerable to their negative emotions. These vulnerabilities will be juxtaposed with experiences of positive emotions or situations, and patients challenged to use positive emotions to combat negative ones. Special emphasis will be placed on coping with negative emotions in conflict situations, and patients will process healthy conflict resolution skills.  Therapeutic Goals: Patient will identify two positive emotions or experiences to reflect on in order to balance out negative emotions:  Patient will label two or more emotions that they find the most difficult to experience:  Patient will be able to demonstrate positive conflict resolution skills through discussion or role plays:   Summary of Patient Progress:   Patient did not attend.     Therapeutic Modalities:   Cognitive Behavioral Therapy Feelings Identification Dialectical Behavioral Therapy   Alveta CHRISTELLA Kerns, LCSW

## 2024-09-27 NOTE — Progress Notes (Signed)
   09/27/24 0755  Psych Admission Type (Psych Patients Only)  Admission Status Involuntary  Psychosocial Assessment  Patient Complaints Anger;None  Eye Contact Fair  Facial Expression Flat  Affect Blunted  Speech Slow  Interaction Assertive  Motor Activity Other (Comment) (WDL)  Appearance/Hygiene Unremarkable;In scrubs  Behavior Characteristics Cooperative;Appropriate to situation  Aggressive Behavior  Effect No apparent injury  Thought Process  Coherency WDL  Content WDL  Delusions None reported or observed  Perception WDL  Hallucination None reported or observed  Judgment WDL  Confusion None  Danger to Self  Current suicidal ideation? Denies  Danger to Others  Danger to Others None reported or observed

## 2024-09-27 NOTE — Plan of Care (Signed)
   Problem: Education: Goal: Knowledge of [facility] Education information/materials will improve Outcome: Progressing Goal: Emotional status will improve Outcome: Progressing Goal: Mental status will improve Outcome: Progressing Goal: Verbalization of understanding the information provided will improve Outcome: Progressing   Problem: Activity: Goal: Interest or engagement in activities will improve Outcome: Progressing Goal: Sleeping patterns will improve Outcome: Progressing   Problem: Coping: Goal: Ability to verbalize frustrations and anger appropriately will improve Outcome: Progressing Goal: Ability to demonstrate self-control will improve Outcome: Progressing   Problem: Health Behavior/Discharge Planning: Goal: Identification of resources available to assist in meeting health care needs will improve Outcome: Progressing Goal: Compliance with treatment plan for underlying cause of condition will improve Outcome: Progressing   Problem: Physical Regulation: Goal: Ability to maintain clinical measurements within normal limits will improve Outcome: Progressing   Problem: Safety: Goal: Periods of time without injury will increase Outcome: Progressing   Problem: Education: Goal: Knowledge of General Education information will improve Description: Including pain rating scale, medication(s)/side effects and non-pharmacologic comfort measures Outcome: Progressing   Problem: Health Behavior/Discharge Planning: Goal: Ability to manage health-related needs will improve Outcome: Progressing   Problem: Clinical Measurements: Goal: Ability to maintain clinical measurements within normal limits will improve Outcome: Progressing Goal: Will remain free from infection Outcome: Progressing Goal: Diagnostic test results will improve Outcome: Progressing Goal: Respiratory complications will improve Outcome: Progressing Goal: Cardiovascular complication will be  avoided Outcome: Progressing   Problem: Activity: Goal: Risk for activity intolerance will decrease Outcome: Progressing   Problem: Nutrition: Goal: Adequate nutrition will be maintained Outcome: Progressing   Problem: Coping: Goal: Level of anxiety will decrease Outcome: Progressing   Problem: Elimination: Goal: Will not experience complications related to bowel motility Outcome: Progressing Goal: Will not experience complications related to urinary retention Outcome: Progressing   Problem: Pain Managment: Goal: General experience of comfort will improve and/or be controlled Outcome: Progressing   Problem: Safety: Goal: Ability to remain free from injury will improve Outcome: Progressing   Problem: Skin Integrity: Goal: Risk for impaired skin integrity will decrease Outcome: Progressing

## 2024-09-27 NOTE — [facility] Suicide Risk Assessment (Signed)
 [facility] INPATIENT:  Family/Significant Other Suicide Prevention Education  Suicide Prevention Education:  Contact Attempts: Mrs. Jerrye, [phone], Probation officer; Rosezetta Moats, [phone], Mother; has been identified by the patient as the family member/significant other with whom the patient will be residing, and identified as the person(s) who will aid the patient in the event of a mental health crisis.  With written consent from the patient, two attempts were made to provide suicide prevention education, prior to and/or following the patient's discharge.  We were unsuccessful in providing suicide prevention education.  A suicide education pamphlet was given to the patient to share with family/significant other.  Date and time of first attempt:09/27/24 st 3:17 PM.   Date and time of second attempt: Second attempt is needed.   Lailoni Baquera M Ieshia Hatcher 09/27/2024, 3:16 PM

## 2024-09-27 NOTE — Group Note (Signed)
 Recreation Therapy Group Note   Group Topic:Coping Skills  Group Date: 09/27/2024 Start Time: 1005 End Time: 1050 Facilitators: Celestia Jeoffrey BRAVO, LRT, CTRS Location: Craft Room  Group Description: Mind Map.  Patient was provided a blank template of a diagram with 32 blank boxes in a tiered system, branching from the center (similar to a bubble chart). LRT directed patients to label the middle of the diagram Coping Skills. LRT and patients then came up with 8 different coping skills as examples. Pt were directed to record their coping skills in the 2nd tier boxes closest to the center.  Patients would then share their coping skills with the group as LRT wrote them out. LRT gave a handout of 99 different coping skills at the end of group.   Goal Area(s) Addressed: Patients will be able to define "coping skills". Patient will identify new coping skills.  Patient will increase communication.   Affect/Mood: N/A   Participation Level: Did not attend    Clinical Observations/Individualized Feedback: Patient did not attend group.   Plan: Continue to engage patient in RT group sessions 2-3x/week.   Jeoffrey BRAVO Celestia, LRT, CTRS 09/27/2024 1:35 PM

## 2024-09-28 LAB — CULTURE, BLOOD (ROUTINE X 2)
Culture: NO GROWTH
Culture: NO GROWTH
Special Requests: ADEQUATE

## 2024-09-28 MED ORDER — IRBESARTAN 75 MG PO TABS
75.0000 mg | ORAL_TABLET | Freq: Every day | ORAL | 0 refills | Status: DC
  Filled 2024-09-28: qty 30, 30d supply, fill #0

## 2024-09-28 MED ORDER — AMLODIPINE BESYLATE 10 MG PO TABS
10.0000 mg | ORAL_TABLET | Freq: Every day | ORAL | 0 refills | Status: AC
  Filled 2024-09-28: qty 30, 30d supply, fill #0

## 2024-09-28 MED ORDER — ALBUTEROL SULFATE (2.5 MG/3ML) 0.083% IN NEBU
2.5000 mg | INHALATION_SOLUTION | Freq: Four times a day (QID) | RESPIRATORY_TRACT | 12 refills | Status: AC | PRN
Start: 2024-09-28 — End: ?
  Filled 2024-09-28: qty 75, 7d supply, fill #0

## 2024-09-28 MED ORDER — QUETIAPINE FUMARATE 100 MG PO TABS
100.0000 mg | ORAL_TABLET | Freq: Every day | ORAL | 0 refills | Status: AC
Start: 2024-09-28 — End: ?
  Filled 2024-09-28: qty 30, 30d supply, fill #0

## 2024-09-28 MED ORDER — DOXYCYCLINE HYCLATE 100 MG PO TABS
100.0000 mg | ORAL_TABLET | Freq: Two times a day (BID) | ORAL | 0 refills | Status: AC
Start: 2024-09-29 — End: 2024-10-06
  Filled 2024-09-28: qty 14, 7d supply, fill #0

## 2024-09-28 NOTE — Discharge Summary (Signed)
 Physician Discharge Summary Note  Patient:  Walter Pena. is an 43 y.o., male MRN:  [id number] DOB:  [birth year] Patient phone:  [phone] (home)  Patient address:   [address] [place] [address],   Total time spent: 40 min Date of Admission:  09/23/2024 Date of Discharge: 09/29/24  Reason for Admission:  Patient brought in via [facility] Department tonight under IVC. Paperwork states patient recently released from prison and has hx of schizophrenia, not taking any meds with escalations in behavior/ acting erratic. Patient states he does not need meds in triage, denies wanting to harm anyone or himself. He does endorse some wounds from previous boil surgery a year ago that is not healing . Psychiatry evaluated the patient after medical stabilization and admitted to inpatient unit. Patient is admitted to adult psych unit with Q15 min safety monitoring. Multidisciplinary team approach is offered. Medication management; group/milieu therapy is offered.   Principal Problem: <principal problem not specified> Discharge Diagnoses: Active Problems:   Schizophrenia, paranoid (HCC)   Past Psychiatric History: see h&p  Family Psychiatric  History: see h&p Social History:  Social History   Substance and Sexual Activity  Alcohol Use Not Currently   Comment: Socially     Social History   Substance and Sexual Activity  Drug Use Not Currently   Types: Marijuana    Social History   Socioeconomic History   Marital status: Single    Spouse name: Not on file   Number of children: Not on file   Years of education: Not on file   Highest education level: Not on file  Occupational History   Not on file  Tobacco Use   Smoking status: Every Day    Current packs/day: 1.00    Average packs/day: 1 pack/day for 25.0 years (25.0 ttl pk-yrs)    Types: Cigarettes   Smokeless tobacco: Never  Substance and Sexual Activity   Alcohol use: Not Currently     Comment: Socially   Drug use: Not Currently    Types: Marijuana   Sexual activity: Not on file  Other Topics Concern   Not on file  Social History Narrative   Not on file   Social Drivers of Health   Financial Resource Strain: Not on file  Food Insecurity: No Food Insecurity (09/23/2024)   Hunger Vital Sign    Worried About Running Out of Food in the Last Year: Never true    Ran Out of Food in the Last Year: Never true  Recent Concern: Food Insecurity - Food Insecurity Present (09/23/2024)   Hunger Vital Sign    Worried About Running Out of Food in the Last Year: Often true    Ran Out of Food in the Last Year: Often true  Transportation Needs: No Transportation Needs (09/23/2024)   PRAPARE - Administrator, Civil Service (Medical): No    Lack of Transportation (Non-Medical): No  Physical Activity: Not on file  Stress: Not on file  Social Connections: Not on file   Past Medical History:  Past Medical History:  Diagnosis Date   CHF (congestive heart failure) (HCC)    COPD (chronic obstructive pulmonary disease) (HCC)    Gout    Hidradenitis    Hypertension    Schizophrenia (HCC)     Past Surgical History:  Procedure Laterality Date   sweat gland removal     Family History:  Family History  Problem Relation Age of Onset   Diabetes Mellitus  II Mother    Hypertension Mother    Hypertension Father     Hospital Course:  Patient brought in via [facility] tonight under IVC. Paperwork states patient recently released from prison and has hx of schizophrenia, not taking any meds with escalations in behavior/ acting erratic. Patient states he does not need meds in triage, denies wanting to harm anyone or himself. He does endorse some wounds from previous boil surgery a year ago that is not healing . Psychiatry evaluated the patient after medical stabilization and admitted to inpatient unit. Patient is admitted to adult psych unit with Q15 min safety  monitoring. Multidisciplinary team approach is offered. Medication management; group/milieu therapy is offered.   On admission, patient was started on Seroquel 100 mg nightly to help with mood stability/paranoia/insomnia.  For few days patient refused medications but maintain safe behaviors on the unit.  He was not responding to internal stimuli and was able to go to the groups on the unit.  He consistently denied depression/anxiety and denied SI/HI.  Towards the discharge patient agreed with medication.  Detailed risk assessment is complete based on clinical exam and individual risk factors and acute suicide risk is low and acute violence risk is low.    On the day of discharge, patient denies SI/HI/plan and denies hallucinations.  Patient remains future oriented and is willing to participate in outpatient mental health services.  Currently, all modifiable risk of harm to self/harm to others have been addressed and patient is no longer appropriate for the acute inpatient setting and is able to continue treatment for mental health needs in the community with the supports as indicated below.  Patient is educated and verbalized understanding of discharge plan of care including medications, follow-up appointments, mental health resources and further crisis services in the community.  He is instructed to call 911 or present to the nearest emergency room should he experience any decompensation in mood, disturbance of bowel or return of suicidal/homicidal ideations.  Patient verbalizes understanding of this education and agrees to this plan of care  Physical Findings: AIMS:  , ,  ,  ,    CIWA:    COWS:      Psychiatric Specialty Exam:  Presentation  General Appearance:  Appropriate for Environment; Casual  Eye Contact: Fair  Speech: Clear and Coherent  Speech Volume: Normal    Mood and Affect  Mood: Euthymic  Affect: Appropriate   Thought Process  Thought  Processes: Coherent  Descriptions of Associations:Intact  Orientation:Full (Time, Place and Person)  Thought Content:Logical  Hallucinations:Hallucinations: None  Ideas of Reference:None  Suicidal Thoughts:Suicidal Thoughts: No  Homicidal Thoughts:Homicidal Thoughts: No   Sensorium  Memory: Immediate Fair; Remote Fair  Judgment: Fair  Insight: Shallow   Executive Functions  Concentration: Fair  Attention Span: Fair  Recall: Fair  Fund of Knowledge: Fair  Language: Fair   Psychomotor Activity  Psychomotor Activity: Psychomotor Activity: Normal  Musculoskeletal: Strength & Muscle Tone: within normal limits Gait & Station: normal Assets  Assets: Manufacturing Systems Engineer; Resilience   Sleep  Sleep: Sleep: Fair    Physical Exam: Physical Exam ROS Blood pressure (!) 149/90, pulse 87, temperature 99.3 F (37.4 C), temperature source Oral, resp. rate 18, height 5' 9 (1.753 m), weight 77.1 kg, SpO2 100%. Body mass index is 25.1 kg/m.   Social History   Tobacco Use  Smoking Status Every Day   Current packs/day: 1.00   Average packs/day: 1 pack/day for 25.0 years (25.0 ttl pk-yrs)  Types: Cigarettes  Smokeless Tobacco Never   Tobacco Cessation:  N/A, patient does not currently use tobacco products   Blood Alcohol level:  Lab Results  Component Value Date   [facility] <15 09/22/2024   ETH <10 10/20/2017    Metabolic Disorder Labs:  Lab Results  Component Value Date   HGBA1C 5.5 09/22/2024   MPG 111.15 09/22/2024   MPG 211.6 04/21/2020   Lab Results  Component Value Date   PROLACTIN 27.2 (H) 05/08/2016   PROLACTIN 6.9 01/19/2016   Lab Results  Component Value Date   CHOL 116 10/21/2017   TRIG 54 10/21/2017   HDL 33 (L) 10/21/2017   CHOLHDL 3.5 10/21/2017   VLDL 11 10/21/2017   LDLCALC 72 10/21/2017   LDLCALC 70 02/17/2017    See Psychiatric Specialty Exam and Suicide Risk Assessment completed by Attending Physician prior to  discharge.  Discharge destination:  Home  Is patient on multiple antipsychotic therapies at discharge:  No   Has Patient had three or more failed trials of antipsychotic monotherapy by history:  No  Recommended Plan for Multiple Antipsychotic Therapies: NA   Allergies as of 09/28/2024       Reactions   Allopurinol     Other Reaction(s): Stevens-Johnson syndrome   Meloxicam    Other Reaction(s): Stevens-Johnson syndrome   Bactrim [sulfamethoxazole-trimethoprim] Nausea And Vomiting   Clindamycin  Other (See Comments)   Unknown   Lisinopril Swelling   Other Rash   aloe        Medication List     STOP taking these medications    albuterol  108 (90 Base) MCG/ACT inhaler Commonly known as: VENTOLIN  HFA Replaced by: albuterol  (2.5 MG/3ML) 0.083% nebulizer solution   albuterol  108 (90 Base) MCG/ACT inhaler Commonly known as: VENTOLIN  HFA Replaced by: albuterol  (2.5 MG/3ML) 0.083% nebulizer solution   famotidine  20 MG tablet Commonly known as: Pepcid    metFORMIN  1000 MG tablet Commonly known as: GLUCOPHAGE    olmesartan  20 MG tablet Commonly known as: BENICAR  Replaced by: irbesartan  75 MG tablet       TAKE these medications      Indication  albuterol  (2.5 MG/3ML) 0.083% nebulizer solution Commonly known as: PROVENTIL  Inhale 3 mLs (2.5 mg total) into the lungs every 6 (six) hours as needed for wheezing or shortness of breath. Replaces: albuterol  108 (90 Base) MCG/ACT inhaler  Indication: Spasm of Lung Air Passages   amLODipine  10 MG tablet Commonly known as: NORVASC  Take 1 tablet (10 mg total) by mouth daily.  Indication: High Blood Pressure   doxycycline  100 MG tablet Commonly known as: VIBRA -TABS Take 1 tablet (100 mg total) by mouth 2 (two) times daily for 7 days. Start taking on: September 29, 2024 What changed: when to take this  Indication: Confined Pocket or Collection of Pus   irbesartan  75 MG tablet Commonly known as: AVAPRO  Take 1 tablet (75 mg  total) by mouth daily. Start taking on: September 29, 2024 Replaces: olmesartan  20 MG tablet  Indication: High Blood Pressure   QUEtiapine 100 MG tablet Commonly known as: SEROQUEL Take 1 tablet (100 mg total) by mouth at bedtime.  Indication: Trouble Sleeping        Follow-up Information     [facility] Follow up.   Why: In person primary care appointment is 11/18 at 3:30 PM with S. Cinderella Perry M.D. Contact information: [address] [place]  [phone]        [facility] Follow up.   Why:  In person assessment for psychiatry is 10/04/24 at 10 AM. Contact information: [address] [place] [address] [phone]                 Follow-up recommendations:  Activity:  as tolerated    Signed: Taheera Thomann, MD 09/28/2024, 7:45 PM

## 2024-09-28 NOTE — [facility] Suicide Risk Assessment (Signed)
 [facility] INPATIENT:  Family/Significant Other Suicide Prevention Education  Suicide Prevention Education:  Education Completed; Mrs. Jerrye, [phone], Engineer, drilling, has been identified by the patient as the family member/significant other with whom the patient will be residing, and identified as the person(s) who will aid the patient in the event of a mental health crisis (suicidal ideations/suicide attempt).  With written consent from the patient, the family member/significant other has been provided the following suicide prevention education, prior to the and/or following the discharge of the patient.  The suicide prevention education provided includes the following: Suicide risk factors Suicide prevention and interventions National Suicide Hotline telephone number [facility] assessment telephone number [facility] Emergency Assistance 911 [facility] and/or Residential Mobile Crisis Unit telephone number  Request made of family/significant other to: Remove weapons (e.g., guns, rifles, knives), all items previously/currently identified as safety concern.   Remove drugs/medications (over-the-counter, prescriptions, illicit drugs), all items previously/currently identified as a safety concern.  The family member/significant other verbalizes understanding of the suicide prevention education information provided.  The family member/significant other agrees to remove the items of safety concern listed above.  CSW touched base with patient's probation officer who confirmed patient's recent release from prison. Probation officer reported that the patient has been compliant with his meetings and reports to the probation offficer. Officer denied any safety concerns and reported that the patient must appear to her office within 72 hours of discharge. The 72 hour regulation has been communicated to the patient who expressed understanding.   Peter Keyworth M Wilkie Zenon 09/28/2024, 11:34 AM

## 2024-09-28 NOTE — Progress Notes (Signed)
   09/27/24 2106  Psych Admission Type (Psych Patients Only)  Admission Status Involuntary  Psychosocial Assessment  Patient Complaints Anxiety  Eye Contact Fair  Facial Expression Flat  Affect Blunted  Speech Slow  Interaction Assertive  Motor Activity Other (Comment) (wnl)  Appearance/Hygiene In scrubs  Behavior Characteristics Cooperative  Mood Pleasant  Aggressive Behavior  Effect No apparent injury  Thought Process  Coherency WDL  Content WDL  Delusions None reported or observed  Perception WDL  Hallucination None reported or observed  Judgment WDL  Confusion None  Danger to Self  Current suicidal ideation? Denies  Danger to Others  Danger to Others None reported or observed

## 2024-09-28 NOTE — Group Note (Signed)
 Date:  09/28/2024 Time:  1:37 PM  Group Topic/Focus:  Goals Group:   The focus of this group is to help patients establish daily goals to achieve during treatment and discuss how the patient can incorporate goal setting into their daily lives to aide in recovery.    Participation Level:  Active  Participation Quality:  Appropriate  Affect:  Appropriate  Cognitive:  Appropriate  Insight: Appropriate  Engagement in Group:  Engaged  Modes of Intervention:  Activity  Additional Comments:    Walter Pena 09/28/2024, 1:37 PM

## 2024-09-28 NOTE — [facility] Suicide Risk Assessment (Signed)
 [facility] INPATIENT:  Family/Significant Other Suicide Prevention Education  Suicide Prevention Education:  Education Completed;  Deiondre Harrower, [phone], Mother, has been identified by the patient as the family member/significant other with whom the patient will be residing, and identified as the person(s) who will aid the patient in the event of a mental health crisis (suicidal ideations/suicide attempt).  With written consent from the patient, the family member/significant other has been provided the following suicide prevention education, prior to the and/or following the discharge of the patient.  The suicide prevention education provided includes the following: Suicide risk factors Suicide prevention and interventions National Suicide Hotline telephone number [facility] assessment telephone number [facility] Emergency Assistance 911 [facility] and/or Residential Mobile Crisis Unit telephone number  Request made of family/significant other to: Remove weapons (e.g., guns, rifles, knives), all items previously/currently identified as safety concern.   Remove drugs/medications (over-the-counter, prescriptions, illicit drugs), all items previously/currently identified as a safety concern.  The family member/significant other verbalizes understanding of the suicide prevention education information provided.  The family member/significant other agrees to remove the items of safety concern listed above.  According to the patient's mother, the patient was recently released from prison on 09/17/24. During this time "he wasn't treated for his schizophrenia. "Mother reported that the patient is known to "talk out of his head." Mother reported that the patient was residing with her in her apartment prior to admission but that he "couldn't sleep and just walks all night." Mother reported "I'm sick myself and can't have him running in and out." Mother reported that at times, the patient  "watches me sleeps and I don't like that." Mother reported that she "sometimes I do and sometimes I don't" when asked if she believed the patient to be a danger to himself or others. Mother reported that the patient "has never harmed anyone but I don't want anyone to harm him." Mother reported that she would be comfortable with the patient returning home if he was compliant with medications and his outpatient appointments. Mother reported that she drives and is willing to take the patient to appointments. Mother reported "when he is on his medications, he is fine." Mother reported that the patient does not have access to weapons. Mother reported no further safety concerns.   Alveta CHRISTELLA Kerns 09/28/2024, 8:35 AM

## 2024-09-28 NOTE — [facility] Suicide Risk Assessment (Signed)
 [facility] Discharge Suicide Risk Assessment   Principal Problem: <principal problem not specified> Discharge Diagnoses: Active Problems:   Schizophrenia, paranoid (HCC)   Total Time spent with patient: 30 minutes  Musculoskeletal: Strength & Muscle Tone: within normal limits Gait & Station: normal Patient leans: N/A  Psychiatric Specialty Exam  Presentation  General Appearance:  Appropriate for Environment; Casual  Eye Contact: Fair  Speech: Clear and Coherent  Speech Volume: Normal  Handedness: Right   Mood and Affect  Mood: Euthymic  Duration of Depression Symptoms: No data recorded Affect: Appropriate   Thought Process  Thought Processes: Coherent  Descriptions of Associations:Intact  Orientation:Full (Time, Place and Person)  Thought Content:Logical  History of Schizophrenia/Schizoaffective disorder:No data recorded Duration of Psychotic Symptoms:No data recorded Hallucinations:Hallucinations: None  Ideas of Reference:None  Suicidal Thoughts:Suicidal Thoughts: No  Homicidal Thoughts:Homicidal Thoughts: No   Sensorium  Memory: Immediate Fair; Remote Fair  Judgment: Fair  Insight: Shallow   Executive Functions  Concentration: Fair  Attention Span: Fair  Recall: Fair  Fund of Knowledge: Fair  Language: Fair   Psychomotor Activity  Psychomotor Activity: Psychomotor Activity: Normal   Assets  Assets: Communication Skills; Resilience   Sleep  Sleep: Sleep: Fair  Estimated Sleeping Duration (Last 24 Hours): 6.00-7.25 hours (Due to Daylight Saving Time, the durations displayed may not accurately represent documentation during the time change interval)  Physical Exam: Physical Exam ROS Blood pressure (!) 149/90, pulse 87, temperature 99.3 F (37.4 C), temperature source Oral, resp. rate 18, height 5' 9 (1.753 m), weight 77.1 kg, SpO2 100%. Body mass index is 25.1 kg/m.  Mental Status Per Nursing Assessment::    On Admission:  NA  Demographic Factors:  Low socioeconomic status  Loss Factors: Decrease in vocational status  Historical Factors: Impulsivity  Risk Reduction Factors:   Living with another person, especially a relative, Positive social support, Positive therapeutic relationship, and Positive coping skills or problem solving skills  Continued Clinical Symptoms:  Schizophrenia:   Paranoid or undifferentiated type  Cognitive Features That Contribute To Risk:  None    Suicide Risk:  Minimal: No identifiable suicidal ideation.  Patients presenting with no risk factors but with morbid ruminations; may be classified as minimal risk based on the severity of the depressive symptoms   Follow-up Information     [facility] Follow up.   Why: In person primary care appointment is 11/18 at 3:30 PM with S. Cinderella Perry M.D. Contact information: [address] [place]  [phone]        [facility] Follow up.   Why: In person assessment for psychiatry is 10/04/24 at 10 AM. Contact information: [address] [place] [address] [phone]                 Plan Of Care/Follow-up recommendations:  Activity:  as tolerated  Allyn Foil, MD 09/28/2024, 7:44 PM

## 2024-09-28 NOTE — Plan of Care (Signed)
   Problem: Education: Goal: Knowledge of [facility] Education information/materials will improve Outcome: Progressing Goal: Emotional status will improve Outcome: Progressing Goal: Mental status will improve Outcome: Progressing Goal: Verbalization of understanding the information provided will improve Outcome: Progressing   Problem: Activity: Goal: Interest or engagement in activities will improve Outcome: Progressing Goal: Sleeping patterns will improve Outcome: Progressing   Problem: Coping: Goal: Ability to verbalize frustrations and anger appropriately will improve Outcome: Progressing Goal: Ability to demonstrate self-control will improve Outcome: Progressing   Problem: Health Behavior/Discharge Planning: Goal: Identification of resources available to assist in meeting health care needs will improve Outcome: Progressing Goal: Compliance with treatment plan for underlying cause of condition will improve Outcome: Progressing   Problem: Physical Regulation: Goal: Ability to maintain clinical measurements within normal limits will improve Outcome: Progressing   Problem: Safety: Goal: Periods of time without injury will increase Outcome: Progressing   Problem: Education: Goal: Knowledge of General Education information will improve Description: Including pain rating scale, medication(s)/side effects and non-pharmacologic comfort measures Outcome: Progressing   Problem: Health Behavior/Discharge Planning: Goal: Ability to manage health-related needs will improve Outcome: Progressing   Problem: Clinical Measurements: Goal: Ability to maintain clinical measurements within normal limits will improve Outcome: Progressing Goal: Will remain free from infection Outcome: Progressing Goal: Diagnostic test results will improve Outcome: Progressing Goal: Respiratory complications will improve Outcome: Progressing Goal: Cardiovascular complication will be  avoided Outcome: Progressing   Problem: Activity: Goal: Risk for activity intolerance will decrease Outcome: Progressing   Problem: Nutrition: Goal: Adequate nutrition will be maintained Outcome: Progressing   Problem: Coping: Goal: Level of anxiety will decrease Outcome: Progressing   Problem: Elimination: Goal: Will not experience complications related to bowel motility Outcome: Progressing Goal: Will not experience complications related to urinary retention Outcome: Progressing   Problem: Pain Managment: Goal: General experience of comfort will improve and/or be controlled Outcome: Progressing   Problem: Safety: Goal: Ability to remain free from injury will improve Outcome: Progressing   Problem: Skin Integrity: Goal: Risk for impaired skin integrity will decrease Outcome: Progressing

## 2024-09-28 NOTE — Group Note (Signed)
 [facility] LCSW Group Therapy Note   Group Date: 09/28/2024 Start Time: 1315 End Time: 1400   Type of Therapy/Topic:  Group Therapy:  Emotion Regulation  Participation Level:  None   Mood:  Description of Group:    The purpose of this group is to assist patients in learning to regulate negative emotions and experience positive emotions. Patients will be guided to discuss ways in which they have been vulnerable to their negative emotions. These vulnerabilities will be juxtaposed with experiences of positive emotions or situations, and patients challenged to use positive emotions to combat negative ones. Special emphasis will be placed on coping with negative emotions in conflict situations, and patients will process healthy conflict resolution skills.  Therapeutic Goals: Patient will identify two positive emotions or experiences to reflect on in order to balance out negative emotions:  Patient will label two or more emotions that they find the most difficult to experience:  Patient will be able to demonstrate positive conflict resolution skills through discussion or role plays:   Summary of Patient Progress: Patient attended group, however, did not engage in group discussions.     Therapeutic Modalities:   Cognitive Behavioral Therapy Feelings Identification Dialectical Behavioral Therapy   Sherryle JINNY Margo, LCSW

## 2024-09-28 NOTE — Progress Notes (Signed)
 [facility] MD Progress Note  09/28/2024 10:23 PM Walter Pena.  MRN:  [id number]  Patient brought in via [facility] Department tonight under IVC. Paperwork states patient recently released from prison and has hx of schizophrenia, not taking any meds with escalations in behavior/ acting erratic. Patient states he does not need meds in triage, denies wanting to harm anyone or himself. He does endorse some wounds from previous boil surgery a year ago that is not healing . Psychiatry evaluated the patient after medical stabilization and admitted to inpatient unit. Patient is admitted to adult psych unit with Q15 min safety monitoring. Multidisciplinary team approach is offered. Medication management; group/milieu therapy is offered.  Subjective:  Chart reviewed, case discussed in multidisciplinary meeting, patient seen during rounds.  11/12: Patient is noted to be resting in his room.  He offers no complaints.  Per nursing staff patient did attend few groups today.  Social worker and patient had a conversation about the need to take his medications so that his mom can take him back home.  Patient is agreeable to take his medications at this time and give consent for follow mental health appointments.  He denies SI/HI/plan and denies auditory/visual hallucinations. 11/11: On interview today, patient is found interacting in common area.  He is noted to be irritable but cooperative.  Per nursing staff he has maintained safe behaviors on the unit.  He continues to decline psychotropic medication.  He remains disorganized and with delusions.  He denies SI/HI/plan and denies hallucinations.  He reports improved sleep and stable appetite.  He denies symptoms of depression or anxiety.  He does not voice any concerns or complaints today.  He remains discharge focused.  11/10: Patient is noted to be resting in bed.  He was superficially engaging in interview he reports poor sleep in the night but continues  to refuse to take medications.  He is attending some groups but minimally engaging.  Per nursing staff he is not displaying any aggressive behaviors.  With the provider he remains discharge focused and when provider tried to discuss treatment options patient got irritable and became selectively mute.  Called patient's mom Ms. Stordahl [phone] who expressed her concern about patient not taking medications and having a history of being on long-acting injectable before he went to prison.  Provider discussed in detail patient not meeting criteria for nonemergent forced medication at this time and only a judge can order forced medication in the community.  Past Psychiatric History: see h&P Family History:  Family History  Problem Relation Age of Onset   Diabetes Mellitus II Mother    Hypertension Mother    Hypertension Father    Social History:  Social History   Substance and Sexual Activity  Alcohol Use Not Currently   Comment: Socially     Social History   Substance and Sexual Activity  Drug Use Not Currently   Types: Marijuana    Social History   Socioeconomic History   Marital status: Single    Spouse name: Not on file   Number of children: Not on file   Years of education: Not on file   Highest education level: Not on file  Occupational History   Not on file  Tobacco Use   Smoking status: Every Day    Current packs/day: 1.00    Average packs/day: 1 pack/day for 25.0 years (25.0 ttl pk-yrs)    Types: Cigarettes   Smokeless tobacco: Never  Substance and Sexual Activity  Alcohol use: Not Currently    Comment: Socially   Drug use: Not Currently    Types: Marijuana   Sexual activity: Not on file  Other Topics Concern   Not on file  Social History Narrative   Not on file   Social Drivers of Health   Financial Resource Strain: Not on file  Food Insecurity: No Food Insecurity (09/23/2024)   Hunger Vital Sign    Worried About Running Out of Food in the Last Year: Never  true    Ran Out of Food in the Last Year: Never true  Recent Concern: Food Insecurity - Food Insecurity Present (09/23/2024)   Hunger Vital Sign    Worried About Running Out of Food in the Last Year: Often true    Ran Out of Food in the Last Year: Often true  Transportation Needs: No Transportation Needs (09/23/2024)   PRAPARE - Administrator, Civil Service (Medical): No    Lack of Transportation (Non-Medical): No  Physical Activity: Not on file  Stress: Not on file  Social Connections: Not on file   Past Medical History:  Past Medical History:  Diagnosis Date   CHF (congestive heart failure) (HCC)    COPD (chronic obstructive pulmonary disease) (HCC)    Gout    Hidradenitis    Hypertension    Schizophrenia (HCC)     Past Surgical History:  Procedure Laterality Date   sweat gland removal      Current Medications: Current Facility-Administered Medications  Medication Dose Route Frequency Provider Last Rate Last Admin   acetaminophen  (TYLENOL ) tablet 650 mg  650 mg Oral Q6H PRN Smith, Annie B, NP       Or   acetaminophen  (TYLENOL ) suppository 650 mg  650 mg Rectal Q6H PRN Smith, Annie B, NP       albuterol  (PROVENTIL ) (2.5 MG/3ML) 0.083% nebulizer solution 2.5 mg  2.5 mg Inhalation Q6H PRN Smith, Annie B, NP       alum & mag hydroxide-simeth (MAALOX/MYLANTA) 200-200-20 MG/5ML suspension 30 mL  30 mL Oral Q4H PRN Smith, Annie B, NP       amLODipine  (NORVASC ) tablet 10 mg  10 mg Oral Daily Eligha Kmetz, MD   10 mg at 09/28/24 9145   haloperidol  (HALDOL ) tablet 5 mg  5 mg Oral TID PRN Smith, Annie B, NP       And   diphenhydrAMINE  (BENADRYL ) capsule 50 mg  50 mg Oral TID PRN Smith, Annie B, NP       haloperidol  lactate (HALDOL ) injection 5 mg  5 mg Intramuscular TID PRN Smith, Annie B, NP       And   diphenhydrAMINE  (BENADRYL ) injection 50 mg  50 mg Intramuscular TID PRN Smith, Annie B, NP       And   LORazepam  (ATIVAN ) injection 2 mg  2 mg Intramuscular TID PRN  Smith, Annie B, NP       haloperidol  lactate (HALDOL ) injection 10 mg  10 mg Intramuscular TID PRN Smith, Annie B, NP       And   diphenhydrAMINE  (BENADRYL ) injection 50 mg  50 mg Intramuscular TID PRN Smith, Annie B, NP       And   LORazepam  (ATIVAN ) injection 2 mg  2 mg Intramuscular TID PRN Smith, Annie B, NP       doxycycline  (VIBRA -TABS) tablet 100 mg  100 mg Oral BID Smith, Annie B, NP   100 mg at 09/28/24 1722   HYDROcodone -acetaminophen  (NORCO/VICODIN)  5-325 MG per tablet 1-2 tablet  1-2 tablet Oral Q4H PRN Smith, Annie B, NP   1 tablet at 09/23/24 2149   hydrOXYzine  (ATARAX ) tablet 25 mg  25 mg Oral TID PRN Smith, Annie B, NP       irbesartan  (AVAPRO ) tablet 75 mg  75 mg Oral Daily Smith, Annie B, NP   75 mg at 09/28/24 9145   magnesium  hydroxide (MILK OF MAGNESIA) suspension 30 mL  30 mL Oral Daily PRN Smith, Annie B, NP       QUEtiapine (SEROQUEL) tablet 100 mg  100 mg Oral QHS Livian Vanderbeck, MD       traZODone  (DESYREL ) tablet 50 mg  50 mg Oral QHS PRN Smith, Annie B, NP   50 mg at 09/23/24 2149    Lab Results:  Results for orders placed or performed during the hospital encounter of 09/23/24 (from the past 48 hours)  Glucose, capillary     Status: None   Collection Time: 09/27/24  1:10 AM  Result Value Ref Range   Glucose-Capillary 94 70 - 99 mg/dL    Comment: Glucose reference range applies only to samples taken after fasting for at least 8 hours.  Glucose, capillary     Status: None   Collection Time: 09/27/24  5:00 AM  Result Value Ref Range   Glucose-Capillary 98 70 - 99 mg/dL    Comment: Glucose reference range applies only to samples taken after fasting for at least 8 hours.  Glucose, capillary     Status: Abnormal   Collection Time: 09/27/24  1:29 PM  Result Value Ref Range   Glucose-Capillary 102 (H) 70 - 99 mg/dL    Comment: Glucose reference range applies only to samples taken after fasting for at least 8 hours.    Blood Alcohol level:  Lab Results   Component Value Date   [facility] <15 09/22/2024   ETH <10 10/20/2017    Metabolic Disorder Labs: Lab Results  Component Value Date   HGBA1C 5.5 09/22/2024   MPG 111.15 09/22/2024   MPG 211.6 04/21/2020   Lab Results  Component Value Date   PROLACTIN 27.2 (H) 05/08/2016   PROLACTIN 6.9 01/19/2016   Lab Results  Component Value Date   CHOL 116 10/21/2017   TRIG 54 10/21/2017   HDL 33 (L) 10/21/2017   CHOLHDL 3.5 10/21/2017   VLDL 11 10/21/2017   LDLCALC 72 10/21/2017   LDLCALC 70 02/17/2017    Physical Findings: AIMS:  , ,  ,  ,    CIWA:    COWS:      Psychiatric Specialty Exam:  Presentation  General Appearance:  Appropriate for Environment; Casual  Eye Contact: Fair  Speech: Clear and Coherent  Speech Volume: Normal    Mood and Affect  Mood: Euthymic  Affect: Appropriate   Thought Process  Thought Processes: Coherent  Orientation:Full (Time, Place and Person)  Thought Content:Logical  Hallucinations: None  Ideas of Reference:None  Suicidal Thoughts: No  Homicidal Thoughts: No   Sensorium  Memory: Immediate Fair; Remote Fair  Judgment: Fair  Insight: Shallow   Executive Functions  Concentration: Fair  Attention Span: Fair  Recall: [employer] of Knowledge: Fair  Language: Fair   Psychomotor Activity  Psychomotor Activity: Normal  Musculoskeletal: Strength & Muscle Tone: within normal limits Gait & Station: normal Assets  Assets: Manufacturing Systems Engineer; Resilience    Physical Exam: Physical Exam Vitals and nursing note reviewed.    ROS Blood pressure (!) 149/90, pulse  87, temperature 99.3 F (37.4 C), temperature source Oral, resp. rate 18, height 5' 9 (1.753 m), weight 77.1 kg, SpO2 100%. Body mass index is 25.1 kg/m.  Diagnosis: Active Problems:   Schizophrenia, paranoid [facility])   Clinical Decision Making: Patient with history of schizophrenia currently admitted for bizarre behaviors.  He has  chronic insomnia and hidradenitis.  He is currently inpatient to be monitored for psychosis  Treatment Plan Summary:  Safety and Monitoring:             -- Involuntary admission to inpatient psychiatric unit for safety, stabilization and treatment             -- Daily contact with patient to assess and evaluate symptoms and progress in treatment             -- Patient's case to be discussed in multi-disciplinary team meeting             -- Observation Level: q15 minute checks             -- Vital signs:  q12 hours             -- Precautions: suicide, elopement, and assault   2. Psychiatric Diagnoses and Treatment:            Seroquel 100mg  at bedtime      -- The risks/benefits/side-effects/alternatives to this medication were discussed in detail with the patient and time was given for questions. The patient consents to medication trial.                -- Metabolic profile and EKG monitoring obtained while on an atypical antipsychotic (BMI: Lipid Panel: HbgA1c: QTc:)              -- Encouraged patient to participate in unit milieu and in scheduled group therapies                            3. Medical Issues Being Addressed:    No urgent medical needs needed  4. Discharge Planning:   -- Social work and case management to assist with discharge planning and identification of hospital follow-up needs prior to discharge  -- Estimated LOS: 5-7 days  Allyn Foil, MD 09/28/2024, 10:23 PM

## 2024-09-28 NOTE — Plan of Care (Signed)
  Problem: Education: Goal: Emotional status will improve Outcome: Progressing Goal: Mental status will improve Outcome: Progressing   Problem: Coping: Goal: Ability to verbalize frustrations and anger appropriately will improve Outcome: Progressing   Problem: Clinical Measurements: Goal: Ability to maintain clinical measurements within normal limits will improve Outcome: Not Met (add Reason)   Problem: Activity: Goal: Risk for activity intolerance will decrease Outcome: Progressing   Problem: Safety: Goal: Ability to remain free from injury will improve Outcome: Progressing

## 2024-09-28 NOTE — Progress Notes (Signed)
   09/28/24 1300  Psych Admission Type (Psych Patients Only)  Admission Status Involuntary  Psychosocial Assessment  Patient Complaints Worrying  Eye Contact Brief  Facial Expression Sad  Affect Anxious  Speech Slow  Interaction Guarded  Motor Activity Slow  Appearance/Hygiene In scrubs  Behavior Characteristics Appropriate to situation  Mood Pleasant  Thought Process  Coherency WDL  Content WDL  Delusions None reported or observed  Perception WDL  Hallucination None reported or observed  Judgment Limited  Confusion None  Danger to Self  Current suicidal ideation? Denies  Danger to Others  Danger to Others None reported or observed

## 2024-09-28 NOTE — Group Note (Signed)
 Date:  09/28/2024 Time:  5:46 PM  Group Topic/Focus:  Wellness Toolbox:   The focus of this group is to discuss various aspects of wellness, balancing those aspects and exploring ways to increase the ability to experience wellness.  Patients will create a wellness toolbox for use upon discharge.    Participation Level:  Active  Participation Quality:  Appropriate  Affect:  Appropriate  Cognitive:  Appropriate  Insight: Appropriate  Engagement in Group:  Engaged  Modes of Intervention:  Activity and Socialization  Additional Comments:    Deitra Caron Mainland 09/28/2024, 5:46 PM

## 2024-09-29 ENCOUNTER — Other Ambulatory Visit: Payer: Self-pay

## 2024-09-29 NOTE — Group Note (Signed)
 Date:  09/29/2024 Time:  3:58 AM  Group Topic/Focus:  Coping With Mental Health Crisis:   The purpose of this group is to help patients identify strategies for coping with mental health crisis.  Group discusses possible causes of crisis and ways to manage them effectively.    Participation Level:  Active  Participation Quality:  Appropriate  Affect:  Appropriate  Cognitive:  Appropriate  Insight: Appropriate  Engagement in Group:  Engaged  Modes of Intervention:  Discussion  Additional Comments:    Leigh VEAR Pais 09/29/2024, 3:58 AM

## 2024-09-29 NOTE — Progress Notes (Signed)
  [facility] Adult Case Management Discharge Plan :  Will you be returning to the same living situation after discharge:  Yes,  Patient to return home.  At discharge, do you have transportation home?: Yes,  CSW has arranged taxi services on patient's behalf.  Do you have the ability to pay for your medications: Yes,  MEDICARE / MEDICARE PART A AND B  Release of information consent forms completed and in the chart;  Patient's signature needed at discharge.  Patient to Follow up at:  Follow-up Information     [facility] Follow up.   Why: In person primary care appointment is 11/18 at 3:30 PM with S. Cinderella Perry M.D. Contact information: [address] [place]  [phone]        [facility] [place] [facility] [facility] Follow up.   Why: In person assessment for psychiatry is 10/04/24 at 10 AM. Contact information: [address] [place] [address] [phone]                 Next level of care provider has access to [facility] Link:no  Safety Planning and Suicide Prevention discussed: Yes,  Mrs. Jerrye, [phone], Probation officer; MANDY Rosezetta Moats, [phone]. Mother  Has patient been referred to the Quitline?: Patient refused referral for treatment  Patient has been referred for addiction treatment: No known substance use disorder.  Alveta CHRISTELLA Kerns, LCSW 09/29/2024, 9:25 AM

## 2024-09-29 NOTE — Progress Notes (Signed)
   09/28/24 2103  Psych Admission Type (Psych Patients Only)  Admission Status Involuntary  Psychosocial Assessment  Patient Complaints Anxiety  Eye Contact Fair  Facial Expression Flat  Affect Blunted  Speech Slow  Interaction Assertive  Motor Activity Other (Comment) (wnl)  Appearance/Hygiene In scrubs  Behavior Characteristics Appropriate to situation  Mood Pleasant  Aggressive Behavior  Effect No apparent injury  Thought Process  Coherency WDL  Content WDL  Delusions None reported or observed  Perception WDL  Hallucination None reported or observed  Judgment WDL  Confusion None  Danger to Self  Current suicidal ideation? Denies  Danger to Others  Danger to Others None reported or observed

## 2024-09-29 NOTE — Plan of Care (Signed)
  Problem: Education: Goal: Knowledge of [place] [facility] Education information/materials will improve Outcome: Adequate for Discharge Goal: Emotional status will improve Outcome: Adequate for Discharge Goal: Mental status will improve Outcome: Adequate for Discharge Goal: Verbalization of understanding the information provided will improve Outcome: Adequate for Discharge   Problem: Activity: Goal: Interest or engagement in activities will improve Outcome: Adequate for Discharge Goal: Sleeping patterns will improve Outcome: Adequate for Discharge   Problem: Coping: Goal: Ability to verbalize frustrations and anger appropriately will improve Outcome: Adequate for Discharge Goal: Ability to demonstrate self-control will improve Outcome: Adequate for Discharge   Problem: Health Behavior/Discharge Planning: Goal: Identification of resources available to assist in meeting health care needs will improve Outcome: Adequate for Discharge Goal: Compliance with treatment plan for underlying cause of condition will improve Outcome: Adequate for Discharge   Problem: Physical Regulation: Goal: Ability to maintain clinical measurements within normal limits will improve Outcome: Adequate for Discharge   Problem: Safety: Goal: Periods of time without injury will increase Outcome: Adequate for Discharge   Problem: Education: Goal: Knowledge of General Education information will improve Description: Including pain rating scale, medication(s)/side effects and non-pharmacologic comfort measures Outcome: Adequate for Discharge   Problem: Health Behavior/Discharge Planning: Goal: Ability to manage health-related needs will improve Outcome: Adequate for Discharge   Problem: Clinical Measurements: Goal: Ability to maintain clinical measurements within normal limits will improve Outcome: Adequate for Discharge Goal: Will remain free from infection Outcome: Adequate for Discharge Goal:  Diagnostic test results will improve Outcome: Adequate for Discharge Goal: Respiratory complications will improve Outcome: Adequate for Discharge Goal: Cardiovascular complication will be avoided Outcome: Adequate for Discharge

## 2024-09-29 NOTE — Plan of Care (Signed)
   Problem: Education: Goal: Knowledge of [facility] Education information/materials will improve Outcome: Progressing Goal: Emotional status will improve Outcome: Progressing Goal: Mental status will improve Outcome: Progressing Goal: Verbalization of understanding the information provided will improve Outcome: Progressing   Problem: Activity: Goal: Interest or engagement in activities will improve Outcome: Progressing Goal: Sleeping patterns will improve Outcome: Progressing   Problem: Coping: Goal: Ability to verbalize frustrations and anger appropriately will improve Outcome: Progressing Goal: Ability to demonstrate self-control will improve Outcome: Progressing   Problem: Health Behavior/Discharge Planning: Goal: Identification of resources available to assist in meeting health care needs will improve Outcome: Progressing Goal: Compliance with treatment plan for underlying cause of condition will improve Outcome: Progressing   Problem: Physical Regulation: Goal: Ability to maintain clinical measurements within normal limits will improve Outcome: Progressing   Problem: Safety: Goal: Periods of time without injury will increase Outcome: Progressing   Problem: Education: Goal: Knowledge of General Education information will improve Description: Including pain rating scale, medication(s)/side effects and non-pharmacologic comfort measures Outcome: Progressing   Problem: Health Behavior/Discharge Planning: Goal: Ability to manage health-related needs will improve Outcome: Progressing   Problem: Clinical Measurements: Goal: Ability to maintain clinical measurements within normal limits will improve Outcome: Progressing Goal: Will remain free from infection Outcome: Progressing Goal: Diagnostic test results will improve Outcome: Progressing Goal: Respiratory complications will improve Outcome: Progressing Goal: Cardiovascular complication will be  avoided Outcome: Progressing   Problem: Activity: Goal: Risk for activity intolerance will decrease Outcome: Progressing   Problem: Nutrition: Goal: Adequate nutrition will be maintained Outcome: Progressing   Problem: Coping: Goal: Level of anxiety will decrease Outcome: Progressing   Problem: Elimination: Goal: Will not experience complications related to bowel motility Outcome: Progressing Goal: Will not experience complications related to urinary retention Outcome: Progressing   Problem: Pain Managment: Goal: General experience of comfort will improve and/or be controlled Outcome: Progressing   Problem: Safety: Goal: Ability to remain free from injury will improve Outcome: Progressing   Problem: Skin Integrity: Goal: Risk for impaired skin integrity will decrease Outcome: Progressing

## 2024-09-29 NOTE — Progress Notes (Signed)
 Patient denies SI/HI/AVH at this time. Discharge instructions, AVS, prescriptions, and transition record reviewed with patient. Patient agrees to comply with medication management, follow-up visit and outpatient therapy. Patient belongings returned to patient. Patient questions and concerns addressed and answered.  Patient ambulatory off unit. Patient discharged via family to home.

## 2024-09-29 NOTE — Care Management Important Message (Signed)
 Important Message  Patient Details  Name: Walter Pena. MRN: [id number] Date of Birth: Apr 09, 1981   Medicare Important Message Given:  Yes - Medicare IM     Shenea Giacobbe M Kambrey Hagger, LCSW 09/29/2024, 9:48 AM

## 2024-10-04 ENCOUNTER — Ambulatory Visit (INDEPENDENT_AMBULATORY_CARE_PROVIDER_SITE_OTHER): Admitting: Internal Medicine

## 2024-10-04 VITALS — BP 144/76 | HR 88 | Ht 68.5 in | Wt 180.0 lb

## 2024-10-04 DIAGNOSIS — S31829D Unspecified open wound of left buttock, subsequent encounter: Secondary | ICD-10-CM

## 2024-10-04 DIAGNOSIS — I10 Essential (primary) hypertension: Secondary | ICD-10-CM

## 2024-10-04 DIAGNOSIS — F17210 Nicotine dependence, cigarettes, uncomplicated: Secondary | ICD-10-CM | POA: Diagnosis not present

## 2024-10-04 DIAGNOSIS — E119 Type 2 diabetes mellitus without complications: Secondary | ICD-10-CM | POA: Diagnosis not present

## 2024-10-04 DIAGNOSIS — S92911A Unspecified fracture of right toe(s), initial encounter for closed fracture: Secondary | ICD-10-CM | POA: Insufficient documentation

## 2024-10-04 DIAGNOSIS — Z1331 Encounter for screening for depression: Secondary | ICD-10-CM

## 2024-10-04 DIAGNOSIS — S31809A Unspecified open wound of unspecified buttock, initial encounter: Secondary | ICD-10-CM | POA: Insufficient documentation

## 2024-10-04 DIAGNOSIS — L732 Hidradenitis suppurativa: Secondary | ICD-10-CM | POA: Diagnosis not present

## 2024-10-04 DIAGNOSIS — F17200 Nicotine dependence, unspecified, uncomplicated: Secondary | ICD-10-CM

## 2024-10-04 MED ORDER — OLMESARTAN MEDOXOMIL 40 MG PO TABS: 40.0000 mg | ORAL_TABLET | Freq: Every day | ORAL | 11 refills | Status: AC

## 2024-10-04 NOTE — Progress Notes (Signed)
 Established Patient Office Visit  Subjective:  Patient ID: Walter Ezzard Nicholaus Mickey., male    DOB: 1981-07-18  Age: 43 y.o. MRN: [id number]  Chief Complaint  Patient presents with   Follow-up    ReEstablish Care Not seen since 2023    No new complaints, here to re-establish care and for hospital f/u. Recent flare of hidradenitis for which he was hospitalized and has completed his atbs.    No other concerns at this time.   Past Medical History:  Diagnosis Date   CHF (congestive heart failure) (HCC)    COPD (chronic obstructive pulmonary disease) (HCC)    Gout    Hidradenitis    Hypertension    Schizophrenia (HCC)     Past Surgical History:  Procedure Laterality Date   sweat gland removal      Social History   Socioeconomic History   Marital status: Single    Spouse name: Not on file   Number of children: Not on file   Years of education: Not on file   Highest education level: Not on file  Occupational History   Not on file  Tobacco Use   Smoking status: Every Day    Current packs/day: 1.00    Average packs/day: 1 pack/day for 25.0 years (25.0 ttl pk-yrs)    Types: Cigarettes   Smokeless tobacco: Never  Substance and Sexual Activity   Alcohol use: Not Currently    Comment: Socially   Drug use: Not Currently    Types: Marijuana   Sexual activity: Not on file  Other Topics Concern   Not on file  Social History Narrative   Not on file   Social Drivers of Health   Financial Resource Strain: Not on file  Food Insecurity: No Food Insecurity (09/23/2024)   Hunger Vital Sign    Worried About Running Out of Food in the Last Year: Never true    Ran Out of Food in the Last Year: Never true  Recent Concern: Food Insecurity - Food Insecurity Present (09/23/2024)   Hunger Vital Sign    Worried About Running Out of Food in the Last Year: Often true    Ran Out of Food in the Last Year: Often true  Transportation Needs: No Transportation Needs (09/23/2024)   PRAPARE -  Administrator, Civil Service (Medical): No    Lack of Transportation (Non-Medical): No  Physical Activity: Not on file  Stress: Not on file  Social Connections: Not on file  Intimate Partner Violence: Not At Risk (09/23/2024)   Humiliation, Afraid, Rape, and Kick questionnaire    Fear of Current or Ex-Partner: No    Emotionally Abused: No    Physically Abused: No    Sexually Abused: No    Family History  Problem Relation Age of Onset   Diabetes Mellitus II Mother    Hypertension Mother    Hypertension Father     Allergies  Allergen Reactions   Allopurinol      Other Reaction(Ronal Maybury): Stevens-Johnson syndrome   Meloxicam     Other Reaction(Shawna Wearing): Stevens-Johnson syndrome   Bactrim [Sulfamethoxazole-Trimethoprim] Nausea And Vomiting   Clindamycin  Other (See Comments)    Unknown   Lisinopril Swelling   Other Rash    aloe    Outpatient Medications Prior to Visit  Medication Sig   amLODipine  (NORVASC ) 10 MG tablet Take 1 tablet (10 mg total) by mouth daily.   doxycycline  (VIBRA -TABS) 100 MG tablet Take 1 tablet (100 mg total) by mouth 2 (  two) times daily for 7 days.   [DISCONTINUED] irbesartan  (AVAPRO ) 75 MG tablet Take 1 tablet (75 mg total) by mouth daily.   albuterol  (PROVENTIL ) (2.5 MG/3ML) 0.083% nebulizer solution Inhale 3 mLs (2.5 mg total) into the lungs every 6 (six) hours as needed for wheezing or shortness of breath. (Patient not taking: Reported on 10/04/2024)   QUEtiapine (SEROQUEL) 100 MG tablet Take 1 tablet (100 mg total) by mouth at bedtime. (Patient not taking: Reported on 10/04/2024)   No facility-administered medications prior to visit.    Review of Systems  Constitutional:  Positive for weight loss.  HENT: Negative.    Eyes: Negative.   Respiratory: Negative.    Cardiovascular: Negative.   Gastrointestinal: Negative.   Genitourinary: Negative.   Skin: Negative.        As in hpi  Neurological: Negative.   Endo/Heme/Allergies: Negative.         Objective:   BP (!) 144/76   Pulse 88   Ht 5' 8.5 (1.74 m)   Wt 180 lb (81.6 kg)   SpO2 98%   BMI 26.97 kg/m   Vitals:   10/04/24 1536  BP: (!) 144/76  Pulse: 88  Height: 5' 8.5 (1.74 m)  Weight: 180 lb (81.6 kg)  SpO2: 98%  BMI (Calculated): 26.97    Physical Exam Vitals reviewed.  Constitutional:      Appearance: Normal appearance.  HENT:     Head: Normocephalic.     Left Ear: There is no impacted cerumen.     Nose: Nose normal.     Mouth/Throat:     Mouth: Mucous membranes are moist.     Pharynx: No posterior oropharyngeal erythema.  Eyes:     Extraocular Movements: Extraocular movements intact.     Pupils: Pupils are equal, round, and reactive to light.  Cardiovascular:     Rate and Rhythm: Regular rhythm.     Chest Wall: PMI is not displaced.     Pulses: Normal pulses.     Heart sounds: Normal heart sounds. No murmur heard. Pulmonary:     Effort: Pulmonary effort is normal.     Breath sounds: Normal air entry. No rhonchi or rales.  Abdominal:     General: Abdomen is flat. Bowel sounds are normal. There is no distension.     Palpations: Abdomen is soft. There is no hepatomegaly, splenomegaly or mass.     Tenderness: There is no abdominal tenderness.  Musculoskeletal:        General: Normal range of motion.     Cervical back: Normal range of motion and neck supple.     Right lower leg: No edema.     Left lower leg: No edema.  Skin:    General: Skin is warm and dry.     Findings: Acne (scarring of face) present.     Comments: Hidradenitis in perineal area  Neurological:     General: No focal deficit present.     Mental Status: He is alert and oriented to person, place, and time.     Cranial Nerves: No cranial nerve deficit.     Motor: No weakness.  Psychiatric:        Mood and Affect: Mood normal.        Behavior: Behavior normal.      No results found for any visits on 10/04/24.      Assessment & Plan:  Walter Pena was seen today for  follow-up.  Primary hypertension -     Olmesartan   Medoxomil; Take 1 tablet (40 mg total) by mouth daily.  Dispense: 30 tablet; Refill: 11  Diabetes mellitus without complication (HCC)  Hidradenitis  Tobacco use disorder  Wound of gluteal cleft, left, subsequent encounter -     Ambulatory referral to Home Health    Problem List Items Addressed This Visit       Cardiovascular and Mediastinum   HTN (hypertension) - Primary   Relevant Medications   olmesartan  (BENICAR ) 40 MG tablet     Endocrine   Diabetes mellitus without complication (HCC)   Relevant Medications   olmesartan  (BENICAR ) 40 MG tablet     Musculoskeletal and Integument   Hidradenitis     Other   Tobacco use disorder   Other Visit Diagnoses       Wound of gluteal cleft, left, subsequent encounter       Relevant Orders   Ambulatory referral to Home Health       Return in about 3 weeks (around 10/25/2024) for cpe with labs prior.   Total time spent: 20 minutes. This time includes review of previous notes and results and patient face to face interaction during today'Belton Peplinski visit.    Sherrill Cinderella Perry, MD  10/04/2024   This document may have been prepared by [facility] Voice Recognition software and as such may include unintentional dictation errors.

## 2024-11-01 ENCOUNTER — Ambulatory Visit: Admitting: Internal Medicine

## 2024-11-14 ENCOUNTER — Other Ambulatory Visit: Payer: Self-pay

## 2024-11-14 ENCOUNTER — Emergency Department
Admission: EM | Admit: 2024-11-14 | Discharge: 2024-11-14 | Disposition: A | Discharge status: Home / Self Care | Attending: Emergency Medicine | Admitting: Emergency Medicine

## 2024-11-14 DIAGNOSIS — M1000 Idiopathic gout, unspecified site: Secondary | ICD-10-CM

## 2024-11-14 DIAGNOSIS — M10071 Idiopathic gout, right ankle and foot: Secondary | ICD-10-CM | POA: Insufficient documentation

## 2024-11-14 DIAGNOSIS — I509 Heart failure, unspecified: Secondary | ICD-10-CM | POA: Insufficient documentation

## 2024-11-14 DIAGNOSIS — I110 Hypertensive heart disease with heart failure: Secondary | ICD-10-CM | POA: Insufficient documentation

## 2024-11-14 DIAGNOSIS — L732 Hidradenitis suppurativa: Secondary | ICD-10-CM | POA: Diagnosis not present

## 2024-11-14 DIAGNOSIS — E119 Type 2 diabetes mellitus without complications: Secondary | ICD-10-CM | POA: Diagnosis not present

## 2024-11-14 DIAGNOSIS — L0231 Cutaneous abscess of buttock: Secondary | ICD-10-CM | POA: Diagnosis not present

## 2024-11-14 DIAGNOSIS — L0291 Cutaneous abscess, unspecified: Secondary | ICD-10-CM

## 2024-11-14 LAB — CBC WITH DIFFERENTIAL/PLATELET
Abs Immature Granulocytes: 0.07 K/uL (ref 0.00–0.07)
Basophils Absolute: 0 K/uL (ref 0.0–0.1)
Basophils Relative: 0 %
Eosinophils Absolute: 0.2 K/uL (ref 0.0–0.5)
Eosinophils Relative: 2 %
HCT: 31.2 % — ABNORMAL LOW (ref 39.0–52.0)
Hemoglobin: 9.9 g/dL — ABNORMAL LOW (ref 13.0–17.0)
Immature Granulocytes: 1 %
Lymphocytes Relative: 22 %
Lymphs Abs: 3.2 K/uL (ref 0.7–4.0)
MCH: 27.6 pg (ref 26.0–34.0)
MCHC: 31.7 g/dL (ref 30.0–36.0)
MCV: 86.9 fL (ref 80.0–100.0)
Monocytes Absolute: 0.8 K/uL (ref 0.1–1.0)
Monocytes Relative: 5 %
Neutro Abs: 10 K/uL — ABNORMAL HIGH (ref 1.7–7.7)
Neutrophils Relative %: 70 %
Platelets: 300 K/uL (ref 150–400)
RBC: 3.59 MIL/uL — ABNORMAL LOW (ref 4.22–5.81)
RDW: 13.8 % (ref 11.5–15.5)
WBC: 14.3 K/uL — ABNORMAL HIGH (ref 4.0–10.5)
nRBC: 0 % (ref 0.0–0.2)

## 2024-11-14 LAB — BASIC METABOLIC PANEL WITH GFR
Anion gap: 8 (ref 5–15)
BUN: 19 mg/dL (ref 6–20)
CO2: 27 mmol/L (ref 22–32)
Calcium: 7.9 mg/dL — ABNORMAL LOW (ref 8.9–10.3)
Chloride: 103 mmol/L (ref 98–111)
Creatinine, Ser: 1.08 mg/dL (ref 0.61–1.24)
GFR, Estimated: >60 mL/min
Glucose, Bld: 88 mg/dL (ref 70–99)
Potassium: 3.6 mmol/L (ref 3.5–5.1)
Sodium: 138 mmol/L (ref 135–145)

## 2024-11-14 LAB — URIC ACID: Uric Acid, Serum: 7.9 mg/dL (ref 3.7–8.6)

## 2024-11-14 MED ORDER — CHLORHEXIDINE GLUCONATE 4 % EX SOLN: Freq: Every day | CUTANEOUS | 2 refills | Status: AC | PRN

## 2024-11-14 MED ORDER — COLCHICINE 0.6 MG PO TABS: 0.6000 mg | ORAL_TABLET | Freq: Two times a day (BID) | ORAL | 2 refills | Status: AC

## 2024-11-14 MED ORDER — CEPHALEXIN 500 MG PO CAPS: 500.0000 mg | ORAL_CAPSULE | Freq: Three times a day (TID) | ORAL | 0 refills | Status: AC

## 2024-11-14 MED ORDER — PIPERACILLIN-TAZOBACTAM 3.375 G IVPB 30 MIN
3.3750 g | Freq: Once | INTRAVENOUS | Status: AC
  Administered 2024-11-14: 3.375 g via INTRAVENOUS
  Filled 2024-11-14: qty 50

## 2024-11-14 MED ORDER — SODIUM CHLORIDE 0.9 % IV BOLUS
1000.0000 mL | Freq: Once | INTRAVENOUS | Status: AC
  Administered 2024-11-14: 1000 mL via INTRAVENOUS

## 2024-11-14 NOTE — ED Provider Notes (Signed)
 "  [facility] Provider Note    Event Date/Time   First MD Initiated Contact with Patient 11/14/24 1109     (approximate)   History   Gout   HPI  Walter Pena. is a 43 y.o. male history of schizophrenia, hidradenitis, gout, hypertension, CHF and diabetes presents emergency department with concerns of gout in the right foot.  Patient states he usually gets an IV.  Explained him we do not usually do an IV for gout.  However at discharge time when we were going to just prescribe colchicine  his mother called to let us  know that he has an abscess on his buttocks.  Patient states he forgot to tell us  that.       Physical Exam   Triage Vital Signs: ED Triage Vitals  Encounter Vitals Group     BP 11/14/24 1047 (!) 161/104     Girls Systolic BP Percentile --      Girls Diastolic BP Percentile --      Boys Systolic BP Percentile --      Boys Diastolic BP Percentile --      Pulse Rate 11/14/24 1047 85     Resp 11/14/24 1047 18     Temp 11/14/24 1047 98.7 F (37.1 C)     Temp src --      SpO2 11/14/24 1047 97 %     Weight 11/14/24 1046 170 lb (77.1 kg)     Height 11/14/24 1046 5' 9.5 (1.765 m)     Head Circumference --      Peak Flow --      Pain Score 11/14/24 1046 10     Pain Loc --      Pain Education --      Exclude from Growth Chart --     Most recent vital signs: Vitals:   11/14/24 1118 11/14/24 1131  BP:  (!) 152/99  Pulse:  86  Resp:  18  Temp:    SpO2: 97% 98%     General: Awake, no distress.   CV:  Good peripheral perfusion.  Resp:  Normal effort. Abd:  No distention.   Other:  Right foot swollen, tender along the right great toe, at bedtime lesions noted on the buttocks, copious discolored drainage noted from these areas.   ED Results / Procedures / Treatments   Labs (all labs ordered are listed, but only abnormal results are displayed) Labs Reviewed  BASIC METABOLIC PANEL WITH GFR - Abnormal; Notable for the  following components:      Result Value   Calcium 7.9 (*)    All other components within normal limits  CBC WITH DIFFERENTIAL/PLATELET - Abnormal; Notable for the following components:   WBC 14.3 (*)    RBC 3.59 (*)    Hemoglobin 9.9 (*)    HCT 31.2 (*)    Neutro Abs 10.0 (*)    All other components within normal limits  URIC ACID     EKG     RADIOLOGY     PROCEDURES:   Procedures  Critical Care:  no Chief Complaint  Patient presents with   Gout      MEDICATIONS ORDERED IN ED: Medications  piperacillin -tazobactam (ZOSYN ) IVPB 3.375 g (0 g Intravenous Stopped 11/14/24 1231)  sodium chloride  0.9 % bolus 1,000 mL (0 mLs Intravenous Stopped 11/14/24 1347)     IMPRESSION / MDM / ASSESSMENT AND PLAN / ED COURSE  I reviewed the triage vital signs and the  nursing notes.                              Differential diagnosis includes, but is not limited to, hidradenitis, abscess, gout, cellulitis  Patient's presentation is most consistent with acute illness / injury with system symptoms.    Labs reassuring other than the WBC is elevated at 14.3 indicating infection  Patient was given Zosyn  IV, normal saline 1 L IV.  Patient tolerated medications well.  Will give him outpatient Keflex , colchicine , and Hibiclens .  Follow-up with his regular doctor if not improving in 2 to 3 days.  Return if worsening.  He is in agreement treatment plan.  Discharged stable     FINAL CLINICAL IMPRESSION(S) / ED DIAGNOSES   Final diagnoses:  Idiopathic gout, unspecified chronicity, unspecified site  Hydradenitis  Abscess     Rx / DC Orders   ED Discharge Orders          Ordered    colchicine  0.6 MG tablet  2 times daily        11/14/24 1124    cephALEXin  (KEFLEX ) 500 MG capsule  3 times daily        11/14/24 1138    chlorhexidine  (HIBICLENS ) 4 % external liquid  Daily PRN        11/14/24 1323             Note:  This document was prepared using Dragon voice  recognition software and may include unintentional dictation errors.    Gasper Devere ORN, PA-C 11/14/24 1719    Arlander Charleston, MD 11/14/24 2026  "

## 2024-11-14 NOTE — ED Triage Notes (Signed)
 Pt comes with two days of left foot pain. Pt states his gout has flared up. Pt has swelling to foot.

## 2024-11-14 NOTE — ED Notes (Signed)
 Lav tube sent also
